# Patient Record
Sex: Male | Born: 1950 | Race: Black or African American | Hispanic: No | Smoking: Current every day smoker
Health system: Southern US, Community
[De-identification: ages and names within clinical notes are randomized; demographics above are authoritative.]

## PROBLEM LIST (undated history)

## (undated) DIAGNOSIS — R410 Disorientation, unspecified: Secondary | ICD-10-CM

## (undated) DIAGNOSIS — I5032 Chronic diastolic (congestive) heart failure: Secondary | ICD-10-CM

## (undated) DIAGNOSIS — I21A1 Myocardial infarction type 2: Secondary | ICD-10-CM

## (undated) DIAGNOSIS — J45909 Unspecified asthma, uncomplicated: Secondary | ICD-10-CM

## (undated) DIAGNOSIS — G5600 Carpal tunnel syndrome, unspecified upper limb: Secondary | ICD-10-CM

## (undated) DIAGNOSIS — Z720 Tobacco use: Secondary | ICD-10-CM

## (undated) DIAGNOSIS — F1010 Alcohol abuse, uncomplicated: Secondary | ICD-10-CM

## (undated) DIAGNOSIS — I10 Essential (primary) hypertension: Secondary | ICD-10-CM

## (undated) DIAGNOSIS — R946 Abnormal results of thyroid function studies: Secondary | ICD-10-CM

## (undated) DIAGNOSIS — I480 Paroxysmal atrial fibrillation: Secondary | ICD-10-CM

## (undated) DIAGNOSIS — N400 Enlarged prostate without lower urinary tract symptoms: Secondary | ICD-10-CM

## (undated) DIAGNOSIS — J309 Allergic rhinitis, unspecified: Secondary | ICD-10-CM

## (undated) HISTORY — DX: Carpal tunnel syndrome, unspecified upper limb: G56.00

## (undated) HISTORY — DX: Benign prostatic hyperplasia without lower urinary tract symptoms: N40.0

## (undated) HISTORY — DX: Allergic rhinitis, unspecified: J30.9

---

## 1999-11-14 ENCOUNTER — Emergency Department (HOSPITAL_COMMUNITY): Admission: EM | Admit: 1999-11-14 | Discharge: 1999-11-14 | Payer: Self-pay | Admitting: Emergency Medicine

## 1999-11-14 ENCOUNTER — Encounter: Payer: Self-pay | Admitting: Emergency Medicine

## 2008-06-04 ENCOUNTER — Emergency Department (HOSPITAL_COMMUNITY): Admission: EM | Admit: 2008-06-04 | Discharge: 2008-06-04 | Payer: Self-pay | Admitting: Emergency Medicine

## 2009-03-16 ENCOUNTER — Emergency Department (HOSPITAL_COMMUNITY): Admission: EM | Admit: 2009-03-16 | Discharge: 2009-03-16 | Payer: Self-pay | Admitting: Emergency Medicine

## 2010-04-09 ENCOUNTER — Emergency Department (HOSPITAL_COMMUNITY): Admission: EM | Admit: 2010-04-09 | Discharge: 2010-04-09 | Payer: Self-pay | Admitting: Emergency Medicine

## 2010-12-27 ENCOUNTER — Emergency Department (HOSPITAL_COMMUNITY)
Admission: EM | Admit: 2010-12-27 | Discharge: 2010-12-27 | Disposition: A | Discharge status: Home / Self Care | Payer: Self-pay | Attending: Emergency Medicine | Admitting: Emergency Medicine

## 2010-12-27 DIAGNOSIS — L299 Pruritus, unspecified: Secondary | ICD-10-CM | POA: Insufficient documentation

## 2010-12-27 DIAGNOSIS — J45909 Unspecified asthma, uncomplicated: Secondary | ICD-10-CM | POA: Insufficient documentation

## 2010-12-27 DIAGNOSIS — L2089 Other atopic dermatitis: Secondary | ICD-10-CM | POA: Insufficient documentation

## 2010-12-27 LAB — GLUCOSE, CAPILLARY: Glucose-Capillary: 82 mg/dL (ref 70–99)

## 2011-04-14 ENCOUNTER — Emergency Department (HOSPITAL_COMMUNITY)
Admission: EM | Admit: 2011-04-14 | Discharge: 2011-04-14 | Disposition: A | Discharge status: Home / Self Care | Payer: Self-pay | Attending: Emergency Medicine | Admitting: Emergency Medicine

## 2011-04-14 DIAGNOSIS — J45909 Unspecified asthma, uncomplicated: Secondary | ICD-10-CM | POA: Insufficient documentation

## 2011-04-14 DIAGNOSIS — R21 Rash and other nonspecific skin eruption: Secondary | ICD-10-CM | POA: Insufficient documentation

## 2012-08-03 ENCOUNTER — Emergency Department (HOSPITAL_COMMUNITY)
Admission: EM | Admit: 2012-08-03 | Discharge: 2012-08-03 | Disposition: A | Discharge status: Home / Self Care | Payer: Self-pay | Attending: Emergency Medicine | Admitting: Emergency Medicine

## 2012-08-03 ENCOUNTER — Encounter (HOSPITAL_COMMUNITY): Payer: Self-pay | Admitting: Emergency Medicine

## 2012-08-03 DIAGNOSIS — H5710 Ocular pain, unspecified eye: Secondary | ICD-10-CM

## 2012-08-03 DIAGNOSIS — M549 Dorsalgia, unspecified: Secondary | ICD-10-CM | POA: Insufficient documentation

## 2012-08-03 DIAGNOSIS — F17200 Nicotine dependence, unspecified, uncomplicated: Secondary | ICD-10-CM | POA: Insufficient documentation

## 2012-08-03 DIAGNOSIS — J45909 Unspecified asthma, uncomplicated: Secondary | ICD-10-CM | POA: Insufficient documentation

## 2012-08-03 DIAGNOSIS — S0560XA Penetrating wound without foreign body of unspecified eyeball, initial encounter: Secondary | ICD-10-CM | POA: Insufficient documentation

## 2012-08-03 DIAGNOSIS — Y9389 Activity, other specified: Secondary | ICD-10-CM | POA: Insufficient documentation

## 2012-08-03 DIAGNOSIS — IMO0002 Reserved for concepts with insufficient information to code with codable children: Secondary | ICD-10-CM | POA: Insufficient documentation

## 2012-08-03 DIAGNOSIS — Y9229 Other specified public building as the place of occurrence of the external cause: Secondary | ICD-10-CM | POA: Insufficient documentation

## 2012-08-03 DIAGNOSIS — Z23 Encounter for immunization: Secondary | ICD-10-CM | POA: Insufficient documentation

## 2012-08-03 HISTORY — DX: Unspecified asthma, uncomplicated: J45.909

## 2012-08-03 MED ORDER — FLUORESCEIN SODIUM 1 MG OP STRP
1.0000 | ORAL_STRIP | Freq: Once | OPHTHALMIC | Status: AC
  Administered 2012-08-03: 1 via OPHTHALMIC
  Filled 2012-08-03: qty 2

## 2012-08-03 MED ORDER — SULFAMETHOXAZOLE-TMP DS 800-160 MG PO TABS
1.0000 | ORAL_TABLET | Freq: Once | ORAL | Status: AC
  Administered 2012-08-03: 1 via ORAL
  Filled 2012-08-03: qty 1

## 2012-08-03 MED ORDER — SULFAMETHOXAZOLE-TRIMETHOPRIM 800-160 MG PO TABS: 1.0000 | ORAL_TABLET | Freq: Two times a day (BID) | ORAL | Status: DC

## 2012-08-03 MED ORDER — TETANUS-DIPHTH-ACELL PERTUSSIS 5-2.5-18.5 LF-MCG/0.5 IM SUSP
0.5000 mL | Freq: Once | INTRAMUSCULAR | Status: AC
  Administered 2012-08-03: 0.5 mL via INTRAMUSCULAR
  Filled 2012-08-03: qty 0.5

## 2012-08-03 MED ORDER — TETRACAINE HCL 0.5 % OP SOLN
2.0000 [drp] | Freq: Once | OPHTHALMIC | Status: AC
  Administered 2012-08-03: 2 [drp] via OPHTHALMIC
  Filled 2012-08-03: qty 2
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
## 2012-08-03 NOTE — ED Provider Notes (Signed)
Medical screening examination/treatment/procedure(s) were performed by non-physician practitioner and as supervising physician I was immediately available for consultation/collaboration.  Susy Placzek T Derisha Funderburke, MD 08/03/12 1713 

## 2015-11-29 ENCOUNTER — Encounter (HOSPITAL_COMMUNITY): Payer: Self-pay | Admitting: Emergency Medicine

## 2015-11-29 ENCOUNTER — Emergency Department (HOSPITAL_COMMUNITY): Payer: Medicaid Other

## 2015-11-29 ENCOUNTER — Emergency Department (HOSPITAL_COMMUNITY)
Admission: EM | Admit: 2015-11-29 | Discharge: 2015-11-29 | Disposition: A | Discharge status: Home / Self Care | Payer: Medicaid Other | Attending: Emergency Medicine | Admitting: Emergency Medicine

## 2015-11-29 DIAGNOSIS — Z79899 Other long term (current) drug therapy: Secondary | ICD-10-CM | POA: Diagnosis not present

## 2015-11-29 DIAGNOSIS — I10 Essential (primary) hypertension: Secondary | ICD-10-CM | POA: Diagnosis not present

## 2015-11-29 DIAGNOSIS — J4531 Mild persistent asthma with (acute) exacerbation: Secondary | ICD-10-CM | POA: Diagnosis not present

## 2015-11-29 DIAGNOSIS — F17210 Nicotine dependence, cigarettes, uncomplicated: Secondary | ICD-10-CM | POA: Insufficient documentation

## 2015-11-29 DIAGNOSIS — R062 Wheezing: Secondary | ICD-10-CM | POA: Diagnosis present

## 2015-11-29 HISTORY — DX: Essential (primary) hypertension: I10

## 2015-11-29 LAB — BASIC METABOLIC PANEL
Anion gap: 9 (ref 5–15)
CALCIUM: 9.5 mg/dL (ref 8.9–10.3)
CO2: 23 mmol/L (ref 22–32)
CREATININE: 1.12 mg/dL (ref 0.61–1.24)
Chloride: 106 mmol/L (ref 101–111)
Glucose, Bld: 91 mg/dL (ref 65–99)
Potassium: 3.9 mmol/L (ref 3.5–5.1)
SODIUM: 138 mmol/L (ref 135–145)

## 2015-11-29 MED ORDER — ALBUTEROL SULFATE (2.5 MG/3ML) 0.083% IN NEBU: 2.5000 mg | INHALATION_SOLUTION | Freq: Four times a day (QID) | RESPIRATORY_TRACT | Status: DC | PRN

## 2015-11-29 MED ORDER — IPRATROPIUM-ALBUTEROL 0.5-2.5 (3) MG/3ML IN SOLN
3.0000 mL | Freq: Once | RESPIRATORY_TRACT | Status: AC
  Administered 2015-11-29: 3 mL via RESPIRATORY_TRACT
  Filled 2015-11-29: qty 3

## 2015-11-29 MED ORDER — ALBUTEROL SULFATE HFA 108 (90 BASE) MCG/ACT IN AERS: 1.0000 | INHALATION_SPRAY | Freq: Four times a day (QID) | RESPIRATORY_TRACT | Status: DC | PRN

## 2015-11-29 MED ORDER — HYDROCHLOROTHIAZIDE 12.5 MG PO TABS: 12.5000 mg | ORAL_TABLET | Freq: Every day | ORAL | Status: DC

## 2015-11-29 MED ORDER — PREDNISONE 20 MG PO TABS
60.0000 mg | ORAL_TABLET | Freq: Once | ORAL | Status: AC
  Administered 2015-11-29: 60 mg via ORAL
  Filled 2015-11-29: qty 3

## 2015-11-29 MED ORDER — CETIRIZINE HCL 10 MG PO TABS: 10.0000 mg | ORAL_TABLET | Freq: Every day | ORAL | Status: DC

## 2015-11-29 MED ORDER — AMLODIPINE BESYLATE 10 MG PO TABS: 10.0000 mg | ORAL_TABLET | Freq: Every day | ORAL | Status: DC

## 2015-11-29 MED ORDER — PREDNISONE 20 MG PO TABS: 40.0000 mg | ORAL_TABLET | Freq: Every day | ORAL | Status: DC

## 2015-11-29 NOTE — ED Provider Notes (Signed)
CSN: [id number]     Arrival date & time 11/29/15  1050 History  By signing my name below, I, Ronney Lion, attest that this documentation has been prepared under the direction and in the presence of Danelle Berry, PA-C. Electronically Signed: Ronney Lion, ED Scribe. 11/29/2015. 1:23 PM.    Chief Complaint  Patient presents with  . Asthma   The history is provided by the patient. No language interpreter was used.    HPI Comments: Mark [name] is a 65 y.o. male with a history of asthma and hypertension, who presents to the Emergency Department complaining of wheeze and mild SOB that began yesterday, unimproved with using his grandsons albuterol inhaler.  He states his seasonal allergies have been bothering him and he has not been taking his allergy meds.  He has productive cough with green to brown sputum.  He denies CP, fever, chills, sweats, weightloss.  He states he continues to smoke 3 cigarettes a day.  He also requests refill of his HTN meds, but he does not know what they are.  He has no other complaints.  Past Medical History  Diagnosis Date  . Asthma   . Hypertension    History reviewed. No pertinent past surgical history. No family history on file. Social History  Substance Use Topics  . Smoking status: Current Every Day Smoker -- 0.50 packs/day    Types: Cigarettes  . Smokeless tobacco: None  . Alcohol Use: Yes     Comment: 2 beers daily    Review of Systems  All other systems reviewed and are negative.     Allergies  Review of patient's allergies indicates no known allergies.  Home Medications   Prior to Admission medications   Medication Sig Start Date End Date Taking? Authorizing Provider  albuterol (PROVENTIL HFA;VENTOLIN HFA) 108 (90 Base) MCG/ACT inhaler Inhale 1-2 puffs into the lungs every 6 (six) hours as needed for wheezing or shortness of breath. 11/29/15   Danelle Berry, PA-C  albuterol (PROVENTIL) (2.5 MG/3ML) 0.083% nebulizer solution Take 3 mLs (2.5 mg  total) by nebulization every 6 (six) hours as needed for wheezing or shortness of breath. 11/29/15   Danelle Berry, PA-C  amLODipine (NORVASC) 10 MG tablet Take 1 tablet (10 mg total) by mouth daily. 11/29/15   Danelle Berry, PA-C  cetirizine (ZYRTEC ALLERGY) 10 MG tablet Take 1 tablet (10 mg total) by mouth daily. 11/29/15   Danelle Berry, PA-C  hydrochlorothiazide (HYDRODIURIL) 12.5 MG tablet Take 1 tablet (12.5 mg total) by mouth daily. 11/29/15   Danelle Berry, PA-C  predniSONE (DELTASONE) 20 MG tablet Take 2 tablets (40 mg total) by mouth daily. 11/29/15   Danelle Berry, PA-C  sulfamethoxazole-trimethoprim (SEPTRA DS) 800-160 MG per tablet Take 1 tablet by mouth every 12 (twelve) hours. 08/03/12   Jethro Bastos, NP   BP 176/100 mmHg  Pulse 97  Temp(Src) 98.3 F (36.8 C) (Oral)  Resp 18  Ht 6' 2.5" (1.892 m)  Wt 79.379 kg  BMI 22.17 kg/m2  SpO2 98% Physical Exam  Constitutional: He is oriented to person, place, and time. He appears well-developed and well-nourished. No distress.  HENT:  Head: Normocephalic and atraumatic.  Nose: Nose normal.  Mouth/Throat: Oropharynx is clear and moist. No oropharyngeal exudate.  Eyes: Conjunctivae and EOM are normal. Pupils are equal, round, and reactive to light. Right eye exhibits no discharge. Left eye exhibits no discharge. No scleral icterus.  Neck: Normal range of motion. No JVD present. No tracheal deviation present. No  thyromegaly present.  Cardiovascular: Normal rate, regular rhythm, normal heart sounds and intact distal pulses.  Exam reveals no gallop and no friction rub.   No murmur heard. Pulmonary/Chest: Effort normal. No respiratory distress. He has wheezes. He has no rales. He exhibits no tenderness.  BS decreased bilaterally at the bases, diffuse expiratory wheeze, no tachypnea, no retractions  Abdominal: Soft. Bowel sounds are normal. He exhibits no distension and no mass. There is no tenderness. There is no rebound and no guarding.   Musculoskeletal: Normal range of motion. He exhibits no edema or tenderness.  Lymphadenopathy:    He has no cervical adenopathy.  Neurological: He is alert and oriented to person, place, and time. He has normal reflexes. No cranial nerve deficit. He exhibits normal muscle tone. Coordination normal.  Skin: Skin is warm and dry. No rash noted. He is not diaphoretic. No erythema. No pallor.  Psychiatric: He has a normal mood and affect. His behavior is normal. Judgment and thought content normal.  Nursing note and vitals reviewed.   ED Course  Procedures (including critical care time)  DIAGNOSTIC STUDIES: Oxygen Saturation is 100% on RA, normal by my interpretation.    COORDINATION OF CARE: 1:23 PM - Discussed treatment plan with pt at bedside which includes steroids, breathing treatments, basic blood work, CXR. Pt verbalized understanding and agreed to plan.   Labs Review Labs Reviewed  BASIC METABOLIC PANEL - Abnormal; Notable for the following:    BUN <5 (*)    All other components within normal limits    Imaging Review Dg Chest 2 View  11/29/2015  CLINICAL DATA:  Productive cough with wheezing and diaphoresis for 3 days, history asthma, hypertension, smoking EXAM: CHEST  2 VIEW COMPARISON:  04/09/2010 FINDINGS: Normal heart size, mediastinal contours, and pulmonary vascularity. Peribronchial thickening and minimal hyperinflation consistent with history of asthma. Subsegmental atelectasis at both bases greater on RIGHT. No acute infiltrate, pleural effusion or pneumothorax. Probable old healed fractures of the RIGHT fifth and sixth ribs anterolaterally. IMPRESSION: Peribronchial thickening and minimal hyperinflation consistent with history of asthma. Bibasilar atelectasis greater on RIGHT. Electronically Signed   By: Ulyses Southward M.D.   On: 11/29/2015 13:53   I have personally reviewed and evaluated these images and lab results as part of my medical decision-making.   EKG  Interpretation None      MDM   Pt with asthma exacerbation, not currently managed, worse with seasonal allergies.  Pt also requests BP meds be prescribed, although he doesn't know what he was prescribed in the past.  Cannot find record after chart review of what medications he was prescribed.  Pt has productive cough, w/o fever, sweats.  CXR obtained.  Pt given steroids and duonebs in the ER.  Pt was afebrile, HR normal, BP elevated, w/o CP or HA complaint.  CXR negative.  Patient ambulated in ED with O2 saturations maintained >90, no current signs of respiratory distress. Lung exam improved after nebulizer treatment. Prednisone given in the ED and pt will bd dc with 5 day burst. Pt states they are breathing at baseline. Pt has been instructed to continue using prescribed medications and to speak with PCP about today's exacerbation.   Pt will be prescribed norvasc and low dose HCTZ for BP.  BMP obtained today.  Case Management asked to assist with setting up follow up on BP.  Return precautions reviewed.  Pt does not appear to have any signs or symptoms of hypertensive emergency, no concern for end-organ damage.  He reports significant improvement of his breathing while in the ER.  D/C in good condition.     Final diagnoses:  Essential hypertension  Asthmatic bronchitis, mild persistent, with acute exacerbation   I personally performed the services described in this documentation, which was scribed in my presence. The recorded information has been reviewed and is accurate.      Danelle BerryLeisa Gregorey Nabor, PA-C 11/30/15 1327  Arby BarretteMarcy Pfeiffer, MD 12/01/15 [phone]

## 2015-11-29 NOTE — ED Notes (Signed)
Pt is in stable condition upon d/c and ambulates from ED. 

## 2015-11-29 NOTE — Discharge Instructions (Signed)
Asthma, Acute Bronchospasm °Acute bronchospasm caused by asthma is also referred to as an asthma attack. Bronchospasm means your air passages become narrowed. The narrowing is caused by inflammation and tightening of the muscles in the air tubes (bronchi) in your lungs. This can make it hard to breathe or cause you to wheeze and cough. °CAUSES °Possible triggers are: °· Animal dander from the skin, hair, or feathers of animals. °· Dust mites contained in house dust. °· Cockroaches. °· Pollen from trees or grass. °· Mold. °· Cigarette or tobacco smoke. °· Air pollutants such as dust, household cleaners, hair sprays, aerosol sprays, paint fumes, strong chemicals, or strong odors. °· Cold air or weather changes. Cold air may trigger inflammation. Winds increase molds and pollens in the air. °· Strong emotions such as crying or laughing hard. °· Stress. °· Certain medicines such as aspirin or beta-blockers. °· Sulfites in foods and drinks, such as dried fruits and wine. °· Infections or inflammatory conditions, such as a flu, cold, or inflammation of the nasal membranes (rhinitis). °· Gastroesophageal reflux disease (GERD). GERD is a condition where stomach acid backs up into your esophagus. °· Exercise or strenuous activity. °SIGNS AND SYMPTOMS  °· Wheezing. °· Excessive coughing, particularly at night. °· Chest tightness. °· Shortness of breath. °DIAGNOSIS  °Your health care provider will ask you about your medical history and perform a physical exam. A chest X-ray or blood testing may be performed to look for other causes of your symptoms or other conditions that may have triggered your asthma attack.  °TREATMENT  °Treatment is aimed at reducing inflammation and opening up the airways in your lungs.  Most asthma attacks are treated with inhaled medicines. These include quick relief or rescue medicines (such as bronchodilators) and controller medicines (such as inhaled corticosteroids). These medicines are sometimes  given through an inhaler or a nebulizer. Systemic steroid medicine taken by mouth or given through an IV tube also can be used to reduce the inflammation when an attack is moderate or severe. Antibiotic medicines are only used if a bacterial infection is present.  °HOME CARE INSTRUCTIONS  °· Rest. °· Drink plenty of liquids. This helps the mucus to remain thin and be easily coughed up. Only use caffeine in moderation and do not use alcohol until you have recovered from your illness. °· Do not smoke. Avoid being exposed to secondhand smoke. °· You play a critical role in keeping yourself in good health. Avoid exposure to things that cause you to wheeze or to have breathing problems. °· Keep your medicines up-to-date and available. Carefully follow your health care provider's treatment plan. °· Take your medicine exactly as prescribed. °· When pollen or pollution is bad, keep windows closed and use an air conditioner or go to places with air conditioning. °· Asthma requires careful medical care. See your health care provider for a follow-up as advised. If you are more than [redacted] weeks pregnant and you were prescribed any new medicines, let your obstetrician know about the visit and how you are doing. Follow up with your health care provider as directed. °· After you have recovered from your asthma attack, make an appointment with your outpatient doctor to talk about ways to reduce the likelihood of future attacks. If you do not have a doctor who manages your asthma, make an appointment with a primary care doctor to discuss your asthma. °SEEK IMMEDIATE MEDICAL CARE IF:  °· You are getting worse. °· You have trouble breathing. If severe, call your local   emergency services (911 in the [place]  You develop chest pain or discomfort.  You are vomiting.  You are not able to keep fluids down.  You are coughing up yellow, green, brown, or bloody sputum.  You have a fever and your symptoms suddenly get worse.  You have  trouble swallowing. MAKE SURE YOU:   Understand these instructions.  Will watch your condition.  Will get help right away if you are not doing well or get worse.   This information is not intended to replace advice given to you by your health care provider. Make sure you discuss any questions you have with your health care provider.   Document Released: 10/10/2006 Document Revised: 06/30/2013 Document Reviewed: 12/31/2012 Elsevier Interactive Patient Education 2016 [employer] Inc[employer]  Cough, Adult Coughing is a reflex that clears your throat and your airways. Coughing helps to heal and protect your lungs. It is normal to cough occasionally, but a cough that happens with other symptoms or lasts a long time may be a sign of a condition that needs treatment. A cough may last only 2-3 weeks (acute), or it may last longer than 8 weeks (chronic). CAUSES Coughing is commonly caused by:  Breathing in substances that irritate your lungs.  A viral or bacterial respiratory infection.  Allergies.  Asthma.  Postnasal drip.  Smoking.  Acid backing up from the stomach into the esophagus (gastroesophageal reflux).  Certain medicines.  Chronic lung problems, including COPD (or rarely, lung cancer).  Other medical conditions such as heart failure. HOME CARE INSTRUCTIONS  Pay attention to any changes in your symptoms. Take these actions to help with your discomfort:  Take medicines only as told by your health care provider.  If you were prescribed an antibiotic medicine, take it as told by your health care provider. Do not stop taking the antibiotic even if you start to feel better.  Talk with your health care provider before you take a cough suppressant medicine.  Drink enough fluid to keep your urine clear or pale yellow.  If the air is dry, use a cold steam vaporizer or humidifier in your bedroom or your home to help loosen secretions.  Avoid anything that causes you to cough at work  or at home.  If your cough is worse at night, try sleeping in a semi-upright position.  Avoid cigarette smoke. If you smoke, quit smoking. If you need help quitting, ask your health care provider.  Avoid caffeine.  Avoid alcohol.  Rest as needed. SEEK MEDICAL CARE IF:   You have new symptoms.  You cough up pus.  Your cough does not get better after 2-3 weeks, or your cough gets worse.  You cannot control your cough with suppressant medicines and you are losing sleep.  You develop pain that is getting worse or pain that is not controlled with pain medicines.  You have a fever.  You have unexplained weight loss.  You have night sweats. SEEK IMMEDIATE MEDICAL CARE IF:  You cough up blood.  You have difficulty breathing.  Your heartbeat is very fast.   This information is not intended to replace advice given to you by your health care provider. Make sure you discuss any questions you have with your health care provider.   Document Released: 12/22/2010 Document Revised: 03/16/2015 Document Reviewed: 09/01/2014 Elsevier Interactive Patient Education 2016 Elsevier Inc.  DASH Eating Plan DASH stands for "Dietary Approaches to Stop Hypertension." The DASH eating plan is a healthy eating plan that has been  shown to reduce high blood pressure (hypertension). Additional health benefits may include reducing the risk of type 2 diabetes mellitus, heart disease, and stroke. The DASH eating plan may also help with weight loss. WHAT DO I NEED TO KNOW ABOUT THE DASH EATING PLAN? For the DASH eating plan, you will follow these general guidelines:  Choose foods with a percent daily value for sodium of less than 5% (as listed on the food label).  Use salt-free seasonings or herbs instead of table salt or sea salt.  Check with your health care provider or pharmacist before using salt substitutes.  Eat lower-sodium products, often labeled as "lower sodium" or "no salt added."  Eat  fresh foods.  Eat more vegetables, fruits, and low-fat dairy products.  Choose whole grains. Look for the word "whole" as the first word in the ingredient list.  Choose fish and skinless chicken or [place] more often than red meat. Limit fish, poultry, and meat to 6 oz (170 g) each day.  Limit sweets, desserts, sugars, and sugary drinks.  Choose heart-healthy fats.  Limit cheese to 1 oz (28 g) per day.  Eat more home-cooked food and less restaurant, buffet, and fast food.  Limit fried foods.  Cook foods using methods other than frying.  Limit canned vegetables. If you do use them, rinse them well to decrease the sodium.  When eating at a restaurant, ask that your food be prepared with less salt, or no salt if possible. WHAT FOODS CAN I EAT? Seek help from a dietitian for individual calorie needs. Grains Whole grain or whole wheat bread. Brown rice. Whole grain or whole wheat pasta. Quinoa, bulgur, and whole grain cereals. Low-sodium cereals. Corn or whole wheat flour tortillas. Whole grain cornbread. Whole grain crackers. Low-sodium crackers. Vegetables Fresh or frozen vegetables (raw, steamed, roasted, or grilled). Low-sodium or reduced-sodium tomato and vegetable juices. Low-sodium or reduced-sodium tomato sauce and paste. Low-sodium or reduced-sodium canned vegetables.  Fruits All fresh, canned (in natural juice), or frozen fruits. Meat and Other Protein Products Ground beef (85% or leaner), grass-fed beef, or beef trimmed of fat. Skinless chicken or [place]. Ground chicken or [place]. Pork trimmed of fat. All fish and seafood. Eggs. Dried beans, peas, or lentils. Unsalted nuts and seeds. Unsalted canned beans. Dairy Low-fat dairy products, such as skim or 1% milk, 2% or reduced-fat cheeses, low-fat ricotta or cottage cheese, or plain low-fat yogurt. Low-sodium or reduced-sodium cheeses. Fats and Oils Tub margarines without trans fats. Light or reduced-fat mayonnaise and salad  dressings (reduced sodium). Avocado. Safflower, olive, or canola oils. Natural peanut or almond butter. Other Unsalted popcorn and pretzels. The items listed above may not be a complete list of recommended foods or beverages. Contact your dietitian for more options. WHAT FOODS ARE NOT RECOMMENDED? Grains White bread. White pasta. White rice. Refined cornbread. Bagels and croissants. Crackers that contain trans fat. Vegetables Creamed or fried vegetables. Vegetables in a cheese sauce. Regular canned vegetables. Regular canned tomato sauce and paste. Regular tomato and vegetable juices. Fruits Dried fruits. Canned fruit in light or heavy syrup. Fruit juice. Meat and Other Protein Products Fatty cuts of meat. Ribs, chicken wings, bacon, sausage, bologna, salami, chitterlings, fatback, hot dogs, bratwurst, and packaged luncheon meats. Salted nuts and seeds. Canned beans with salt. Dairy Whole or 2% milk, cream, half-and-half, and cream cheese. Whole-fat or sweetened yogurt. Full-fat cheeses or blue cheese. Nondairy creamers and whipped toppings. Processed cheese, cheese spreads, or cheese curds. Condiments Onion and garlic salt, seasoned  salt, table salt, and sea salt. Canned and packaged gravies. Worcestershire sauce. Tartar sauce. Barbecue sauce. Teriyaki sauce. Soy sauce, including reduced sodium. Steak sauce. Fish sauce. Oyster sauce. Cocktail sauce. Horseradish. Ketchup and mustard. Meat flavorings and tenderizers. Bouillon cubes. Hot sauce. Tabasco sauce. Marinades. Taco seasonings. Relishes. Fats and Oils Butter, stick margarine, lard, shortening, ghee, and bacon fat. Coconut, palm kernel, or palm oils. Regular salad dressings. Other Pickles and olives. Salted popcorn and pretzels. The items listed above may not be a complete list of foods and beverages to avoid. Contact your dietitian for more information. WHERE CAN I FIND MORE INFORMATION? National Heart, Lung, and Blood Institute:  [employer]   This information is not intended to replace advice given to you by your health care provider. Make sure you discuss any questions you have with your health care provider.   Document Released: 06/14/2011 Document Revised: 07/16/2014 Document Reviewed: 04/29/2013 Elsevier Interactive Patient Education 2016 [employer].  How to Use a Nebulizer If you have asthma or other breathing problems, you might need to breathe in (inhale) medicine. This can be done with a nebulizer. A nebulizer is a device that turns liquid medicine into a mist that you can inhale.  There are different kinds of nebulizers. Most are small. With some, you breathe in through a mouthpiece. With others, a mask fits over your nose and mouth. Most nebulizers must be connected to a small air compressor. Air is forced through tubing from the compressor to the nebulizer. The forced air changes the liquid into a fine spray. RISKS AND COMPLICATIONS The nebulizer must work properly for it to help your breathing. If the nebulizer does not produce mist, or if foam comes out, this indicates that the nebulizer is not working properly. Sometimes a filter can get clogged, or there might be a problem with the air compressor. Check the instruction booklet that came with your nebulizer. It should tell you how to fix problems or where to call for help. You should have at least one extra nebulizer at home. That way, you will always have one when you need it.  HOW TO PREPARE BEFORE USING THE NEBULIZER Take these steps before using the nebulizer:  Check your medicine. Make sure it has not expired and is not damaged in any way.   Wash your hands with soap and water.   Put all the parts of your nebulizer on a sturdy, flat surface. Make sure the tubing connects the compressor and the nebulizer.  Measure the liquid medicine according to your health care provider's instructions. Pour it into the  nebulizer.  Attach the mouthpiece or mask.   Test the nebulizer by turning it on to make sure a spray is coming out. Then, turn it off.  HOW TO USE THE NEBULIZER  Sit down and focus on staying relaxed.   If your nebulizer has a mask, put it over your nose and mouth. If you use a mouthpiece, put it in your mouth. Press your lips firmly around the mouthpiece.  Turn on the nebulizer.   Breathe out.   Some nebulizers have a finger valve. If yours does, cover up the air hole so the air gets to the nebulizer.  Once the medicine begins to mist out, take slow, deep breaths. If there is a finger valve, release it at the end of your breath.  Continue taking slow, deep breaths until the nebulizer is empty.  Be sure to stop the machine at any point if you start  coughing or if the medicine foams or bubbles. HOW TO CLEAN THE NEBULIZER  The nebulizer and all its parts must be kept very clean. Follow the manufacturer's instructions for cleaning. For most nebulizers, you should follow these guidelines:  Wash the nebulizer after each use. Use warm water and soap. Rinse it well. Shake the nebulizer to remove extra water. Put it on a clean towel until it is completely dry. To make sure it is dry, put the nebulizer back together. Turn on the compressor for a few minutes. This will blow air through the nebulizer.   Do not wash the tubing or the finger valve.   Store the nebulizer in a dust-free place.   Inspect the filter every week. Replace it any time it looks dirty.   Sometimes the nebulizer will need a more complete cleaning. The instruction booklet should say how often you need to do this. SEEK MEDICAL CARE IF:   You continue to have difficulty breathing.   You have trouble using the nebulizer.    This information is not intended to replace advice given to you by your health care provider. Make sure you discuss any questions you have with your health care provider.   Document  Released: 06/13/2009 Document Revised: 07/16/2014 Document Reviewed: 12/15/2012 Elsevier Interactive Patient Education 2016 [employer].  How to Use an Inhaler Proper inhaler technique is very important. Good technique ensures that the medicine reaches the lungs. Poor technique results in depositing the medicine on the tongue and back of the throat rather than in the airways. If you do not use the inhaler with good technique, the medicine will not help you. STEPS TO FOLLOW IF USING AN INHALER WITHOUT AN EXTENSION TUBE  Remove the cap from the inhaler.  If you are using the inhaler for the first time, you will need to prime it. Shake the inhaler for 5 seconds and release four puffs into the air, away from your face. Ask your health care provider or pharmacist if you have questions about priming your inhaler.  Shake the inhaler for 5 seconds before each breath in (inhalation).  Position the inhaler so that the top of the canister faces up.  Put your index finger on the top of the medicine canister. Your thumb supports the bottom of the inhaler.  Open your mouth.  Either place the inhaler between your teeth and place your lips tightly around the mouthpiece, or hold the inhaler 1-2 inches away from your open mouth. If you are unsure of which technique to use, ask your health care provider.  Breathe out (exhale) normally and as completely as possible.  Press the canister down with your index finger to release the medicine.  At the same time as the canister is pressed, inhale deeply and slowly until your lungs are completely filled. This should take 4-6 seconds. Keep your tongue down.  Hold the medicine in your lungs for 5-10 seconds (10 seconds is best). This helps the medicine get into the small airways of your lungs.  Breathe out slowly, through pursed lips. Whistling is an example of pursed lips.  Wait at least 15-30 seconds between puffs. Continue with the above steps until you have  taken the number of puffs your health care provider has ordered. Do not use the inhaler more than your health care provider tells you.  Replace the cap on the inhaler.  Follow the directions from your health care provider or the inhaler insert for cleaning the inhaler. STEPS TO FOLLOW  IF USING AN INHALER WITH AN EXTENSION (SPACER)  Remove the cap from the inhaler.  If you are using the inhaler for the first time, you will need to prime it. Shake the inhaler for 5 seconds and release four puffs into the air, away from your face. Ask your health care provider or pharmacist if you have questions about priming your inhaler.  Shake the inhaler for 5 seconds before each breath in (inhalation).  Place the open end of the spacer onto the mouthpiece of the inhaler.  Position the inhaler so that the top of the canister faces up and the spacer mouthpiece faces you.  Put your index finger on the top of the medicine canister. Your thumb supports the bottom of the inhaler and the spacer.  Breathe out (exhale) normally and as completely as possible.  Immediately after exhaling, place the spacer between your teeth and into your mouth. Close your lips tightly around the spacer.  Press the canister down with your index finger to release the medicine.  At the same time as the canister is pressed, inhale deeply and slowly until your lungs are completely filled. This should take 4-6 seconds. Keep your tongue down and out of the way.  Hold the medicine in your lungs for 5-10 seconds (10 seconds is best). This helps the medicine get into the small airways of your lungs. Exhale.  Repeat inhaling deeply through the spacer mouthpiece. Again hold that breath for up to 10 seconds (10 seconds is best). Exhale slowly. If it is difficult to take this second deep breath through the spacer, breathe normally several times through the spacer. Remove the spacer from your mouth.  Wait at least 15-30 seconds between puffs.  Continue with the above steps until you have taken the number of puffs your health care provider has ordered. Do not use the inhaler more than your health care provider tells you.  Remove the spacer from the inhaler, and place the cap on the inhaler.  Follow the directions from your health care provider or the inhaler insert for cleaning the inhaler and spacer. If you are using different kinds of inhalers, use your quick relief medicine to open the airways 10-15 minutes before using a steroid if instructed to do so by your health care provider. If you are unsure which inhalers to use and the order of using them, ask your health care provider, nurse, or respiratory therapist. If you are using a steroid inhaler, always rinse your mouth with water after your last puff, then gargle and spit out the water. Do not swallow the water. AVOID:  Inhaling before or after starting the spray of medicine. It takes practice to coordinate your breathing with triggering the spray.  Inhaling through the nose (rather than the mouth) when triggering the spray. HOW TO DETERMINE IF YOUR INHALER IS FULL OR NEARLY EMPTY You cannot know when an inhaler is empty by shaking it. A few inhalers are now being made with dose counters. Ask your health care provider for a prescription that has a dose counter if you feel you need that extra help. If your inhaler does not have a counter, ask your health care provider to help you determine the date you need to refill your inhaler. Write the refill date on a calendar or your inhaler canister. Refill your inhaler 7-10 days before it runs out. Be sure to keep an adequate supply of medicine. This includes making sure it is not expired, and that you have a  spare inhaler.  SEEK MEDICAL CARE IF:   Your symptoms are only partially relieved with your inhaler.  You are having trouble using your inhaler.  You have some increase in phlegm. SEEK IMMEDIATE MEDICAL CARE IF:   You feel little or  no relief with your inhalers. You are still wheezing and are feeling shortness of breath or tightness in your chest or both.  You have dizziness, headaches, or a fast heart rate.  You have chills, fever, or night sweats.  You have a noticeable increase in phlegm production, or there is blood in the phlegm. MAKE SURE YOU:   Understand these instructions.  Will watch your condition.  Will get help right away if you are not doing well or get worse.   This information is not intended to replace advice given to you by your health care provider. Make sure you discuss any questions you have with your health care provider.   Document Released: 06/22/2000 Document Revised: 04/15/2013 Document Reviewed: 01/22/2013 Elsevier Interactive Patient Education 2016 [employer].  Hypertension Hypertension, commonly called high blood pressure, is when the force of blood pumping through your arteries is too strong. Your arteries are the blood vessels that carry blood from your heart throughout your body. A blood pressure reading consists of a higher number over a lower number, such as 110/72. The higher number (systolic) is the pressure inside your arteries when your heart pumps. The lower number (diastolic) is the pressure inside your arteries when your heart relaxes. Ideally you want your blood pressure below 120/80. Hypertension forces your heart to work harder to pump blood. Your arteries may become narrow or stiff. Having untreated or uncontrolled hypertension can cause heart attack, stroke, kidney disease, and other problems. RISK FACTORS Some risk factors for high blood pressure are controllable. Others are not.  Risk factors you cannot control include:   Race. You may be at higher risk if you are African American.  Age. Risk increases with age.  Gender. Men are at higher risk than women before age 9 years. After age 94, women are at higher risk than men. Risk factors you can control  include:  Not getting enough exercise or physical activity.  Being overweight.  Getting too much fat, sugar, calories, or salt in your diet.  Drinking too much alcohol. SIGNS AND SYMPTOMS Hypertension does not usually cause signs or symptoms. Extremely high blood pressure (hypertensive crisis) may cause headache, anxiety, shortness of breath, and nosebleed. DIAGNOSIS To check if you have hypertension, your health care provider will measure your blood pressure while you are seated, with your arm held at the level of your heart. It should be measured at least twice using the same arm. Certain conditions can cause a difference in blood pressure between your right and left arms. A blood pressure reading that is higher than normal on one occasion does not mean that you need treatment. If it is not clear whether you have high blood pressure, you may be asked to return on a different day to have your blood pressure checked again. Or, you may be asked to monitor your blood pressure at home for 1 or more weeks. TREATMENT Treating high blood pressure includes making lifestyle changes and possibly taking medicine. Living a healthy lifestyle can help lower high blood pressure. You may need to change some of your habits. Lifestyle changes may include:  Following the DASH diet. This diet is high in fruits, vegetables, and whole grains. It is low in salt, red  meat, and added sugars.  Keep your sodium intake below 2,300 mg per day.  Getting at least 30-45 minutes of aerobic exercise at least 4 times per week.  Losing weight if necessary.  Not smoking.  Limiting alcoholic beverages.  Learning ways to reduce stress. Your health care provider may prescribe medicine if lifestyle changes are not enough to get your blood pressure under control, and if one of the following is true:  You are 60-61 years of age and your systolic blood pressure is above 140.  You are 48 years of age or older, and your  systolic blood pressure is above 150.  Your diastolic blood pressure is above 90.  You have diabetes, and your systolic blood pressure is over 140 or your diastolic blood pressure is over 90.  You have kidney disease and your blood pressure is above 140/90.  You have heart disease and your blood pressure is above 140/90. Your personal target blood pressure may vary depending on your medical conditions, your age, and other factors. HOME CARE INSTRUCTIONS  Have your blood pressure rechecked as directed by your health care provider.   Take medicines only as directed by your health care provider. Follow the directions carefully. Blood pressure medicines must be taken as prescribed. The medicine does not work as well when you skip doses. Skipping doses also puts you at risk for problems.  Do not smoke.   Monitor your blood pressure at home as directed by your health care provider. SEEK MEDICAL CARE IF:   You think you are having a reaction to medicines taken.  You have recurrent headaches or feel dizzy.  You have swelling in your ankles.  You have trouble with your vision. SEEK IMMEDIATE MEDICAL CARE IF:  You develop a severe headache or confusion.  You have unusual weakness, numbness, or feel faint.  You have severe chest or abdominal pain.  You vomit repeatedly.  You have trouble breathing. MAKE SURE YOU:   Understand these instructions.  Will watch your condition.  Will get help right away if you are not doing well or get worse.   This information is not intended to replace advice given to you by your health care provider. Make sure you discuss any questions you have with your health care provider.   Document Released: 06/25/2005 Document Revised: 11/09/2014 Document Reviewed: 04/17/2013 Elsevier Interactive Patient Education [employer]

## 2015-11-29 NOTE — ED Notes (Signed)
Patient states he has been having problems with his asthma.   Patient is talking in full sentences and denies shortness of breath at triage.   Patient states he needs to get a nebulizer machine, that his inhaler is not working most of the time.   Patient states he has been using his son's nebulizer at home, but "i am almost out of medicine".

## 2015-11-29 NOTE — ED Notes (Signed)
Patient transported to X-ray 

## 2015-11-29 NOTE — ED Notes (Signed)
Returned from xray

## 2015-11-29 NOTE — ED Notes (Signed)
Pulse Oximetry while ambulating maintained at 98%

## 2016-07-01 ENCOUNTER — Encounter (HOSPITAL_COMMUNITY): Payer: Self-pay | Admitting: *Deleted

## 2016-07-01 ENCOUNTER — Emergency Department (HOSPITAL_COMMUNITY): Payer: Medicaid Other

## 2016-07-01 ENCOUNTER — Emergency Department (HOSPITAL_COMMUNITY)
Admission: EM | Admit: 2016-07-01 | Discharge: 2016-07-01 | Disposition: A | Discharge status: Home / Self Care | Payer: Medicaid Other | Attending: Emergency Medicine | Admitting: Emergency Medicine

## 2016-07-01 DIAGNOSIS — Z79899 Other long term (current) drug therapy: Secondary | ICD-10-CM | POA: Diagnosis not present

## 2016-07-01 DIAGNOSIS — I10 Essential (primary) hypertension: Secondary | ICD-10-CM | POA: Diagnosis not present

## 2016-07-01 DIAGNOSIS — F17210 Nicotine dependence, cigarettes, uncomplicated: Secondary | ICD-10-CM | POA: Diagnosis not present

## 2016-07-01 DIAGNOSIS — J45909 Unspecified asthma, uncomplicated: Secondary | ICD-10-CM | POA: Diagnosis present

## 2016-07-01 DIAGNOSIS — J4521 Mild intermittent asthma with (acute) exacerbation: Secondary | ICD-10-CM | POA: Insufficient documentation

## 2016-07-01 MED ORDER — IPRATROPIUM BROMIDE 0.02 % IN SOLN
0.5000 mg | Freq: Once | RESPIRATORY_TRACT | Status: AC
  Administered 2016-07-01: 0.5 mg via RESPIRATORY_TRACT
  Filled 2016-07-01: qty 2.5

## 2016-07-01 MED ORDER — PREDNISONE 20 MG PO TABS
60.0000 mg | ORAL_TABLET | Freq: Once | ORAL | Status: AC
  Administered 2016-07-01: 60 mg via ORAL
  Filled 2016-07-01: qty 3

## 2016-07-01 MED ORDER — ALBUTEROL SULFATE (2.5 MG/3ML) 0.083% IN NEBU
5.0000 mg | INHALATION_SOLUTION | Freq: Once | RESPIRATORY_TRACT | Status: AC
  Administered 2016-07-01: 5 mg via RESPIRATORY_TRACT
  Filled 2016-07-01: qty 6

## 2016-07-01 MED ORDER — ALBUTEROL SULFATE HFA 108 (90 BASE) MCG/ACT IN AERS
2.0000 | INHALATION_SPRAY | Freq: Once | RESPIRATORY_TRACT | Status: DC
  Filled 2016-07-01: qty 6.7

## 2016-07-01 MED ORDER — PREDNISONE 20 MG PO TABS: 40.0000 mg | ORAL_TABLET | Freq: Every day | ORAL | 0 refills | Status: DC

## 2016-07-01 MED ORDER — AZITHROMYCIN 250 MG PO TABS: ORAL_TABLET | ORAL | 0 refills | Status: DC

## 2016-07-01 NOTE — ED Triage Notes (Signed)
The pt has asthma and for one week he has had more difficulty breathing.  He also ran ourt of his inhaler one week ago.  He has been coughing and waking up sweating

## 2016-07-01 NOTE — ED Provider Notes (Signed)
[facility]EMERGENCY DEPT Provider Note   CSN: [id number] Arrival date & time: 07/01/16  [id number]     History   Chief Complaint Chief Complaint  Patient presents with  . Asthma    HPI Mark [name] is a 65 y.o. male hx of asthma, HTN, Here presenting with shortness of breath, cough. Patient states that he has a history of asthma and is still smoking cigarettes. He has been having nonproductive cough for the last week or so. Denies any fevers or chills. Denies any vomiting. Patient came to the ER several months ago for asthma exacerbation and was sent home with steroids. He states that it has been several years since he had an admission for asthma. He is also out of albuterol for the last week or so.    The history is provided by the patient.    Past Medical History:  Diagnosis Date  . Asthma   . Hypertension     There are no active problems to display for this patient.   History reviewed. No pertinent surgical history.     Home Medications    Prior to Admission medications   Medication Sig Start Date End Date Taking? Authorizing Provider  albuterol (PROVENTIL HFA;VENTOLIN HFA) 108 (90 Base) MCG/ACT inhaler Inhale 1-2 puffs into the lungs every 6 (six) hours as needed for wheezing or shortness of breath. 11/29/15  Yes Danelle BerryLeisa Tapia, PA-C  amLODipine (NORVASC) 10 MG tablet Take 1 tablet (10 mg total) by mouth daily. 11/29/15  Yes Danelle BerryLeisa Tapia, PA-C  hydrochlorothiazide (HYDRODIURIL) 12.5 MG tablet Take 1 tablet (12.5 mg total) by mouth daily. 11/29/15  Yes Danelle BerryLeisa Tapia, PA-C  montelukast (SINGULAIR) 10 MG tablet Take 10 mg by mouth daily. 06/11/16  Yes Historical Provider, MD  Multiple Vitamin (MULTIVITAMIN WITH MINERALS) TABS tablet Take 1 tablet by mouth daily.   Yes Historical Provider, MD  predniSONE (DELTASONE) 20 MG tablet Take 2 tablets (40 mg total) by mouth daily. Patient taking differently: Take 20 mg by mouth daily.  11/29/15  Yes Danelle BerryLeisa Tapia, PA-C  albuterol (PROVENTIL) (2.5  MG/3ML) 0.083% nebulizer solution Take 3 mLs (2.5 mg total) by nebulization every 6 (six) hours as needed for wheezing or shortness of breath. Patient not taking: Reported on 07/01/2016 11/29/15   Danelle BerryLeisa Tapia, PA-C  cetirizine (ZYRTEC ALLERGY) 10 MG tablet Take 1 tablet (10 mg total) by mouth daily. Patient not taking: Reported on 07/01/2016 11/29/15   Danelle BerryLeisa Tapia, PA-C    Family History No family history on file.  Social History Social History  Substance Use Topics  . Smoking status: Current Every Day Smoker    Packs/day: 0.50    Types: Cigarettes  . Smokeless tobacco: Never Used  . Alcohol use Yes     Comment: 2 beers daily     Allergies   Patient has no known allergies.   Review of Systems Review of Systems  Respiratory: Positive for cough and shortness of breath.   All other systems reviewed and are negative.    Physical Exam Updated Vital Signs BP 156/94   Pulse 93   Temp 98.6 F (37 C) (Oral)   Resp 14   SpO2 96%   Physical Exam  Constitutional:  Slightly tachypneic, overall well appearing   HENT:  Head: Normocephalic.  Mouth/Throat: Oropharynx is clear and moist.  Eyes: EOM are normal. Pupils are equal, round, and reactive to light.  Neck: Normal range of motion. Neck supple.  Cardiovascular: Normal rate, regular rhythm and normal heart sounds.  Pulmonary/Chest:  Slightly tachypneic, mild wheezing throughout, no crackles   Abdominal: Soft. Bowel sounds are normal. He exhibits no distension. There is no tenderness. There is no guarding.  Musculoskeletal: Normal range of motion.  Neurological: He is alert.  Skin: Skin is warm.  Psychiatric: He has a normal mood and affect.  Nursing note and vitals reviewed.    ED Treatments / Results  Labs (all labs ordered are listed, but only abnormal results are displayed) Labs Reviewed - No data to display  EKG  EKG Interpretation  Date/Time:  Sunday July 01 2016 06:44:23 EST Ventricular Rate:  99 PR  Interval:  136 QRS Duration: 78 QT Interval:  350 QTC Calculation: 449 R Axis:   71 Text Interpretation:  Sinus rhythm with Premature atrial complexes Otherwise normal ECG No previous ECGs available Confirmed by Mykia Holton  MD, Reid Regas ([id number]) on 07/01/2016 9:08:04 AM       Radiology Dg Chest 2 View  Result Date: 07/01/2016 CLINICAL DATA:  Cough, wheezing, and shortness of breath for 1 week, history asthma, hypertension, light smoker EXAM: CHEST  2 VIEW COMPARISON:  11/29/2015 FINDINGS: Normal heart size, mediastinal contours, and pulmonary vascularity. Bronchitic changes and hyperinflation which could reflect COPD or asthma. Anterior RIGHT basilar atelectasis versus scarring unchanged. Remaining lungs clear. No infiltrate, pleural effusion or pneumothorax. No acute osseous findings. IMPRESSION: Peribronchial thickening and hyperinflation question COPD or asthma. Chronic RIGHT basilar atelectasis versus scarring. Electronically Signed   By: Ulyses SouthwardMark  Boles M.D.   On: 07/01/2016 09:52    Procedures Procedures (including critical care time)  Medications Ordered in ED Medications  albuterol (PROVENTIL HFA;VENTOLIN HFA) 108 (90 Base) MCG/ACT inhaler 2 puff (not administered)  albuterol (PROVENTIL) (2.5 MG/3ML) 0.083% nebulizer solution 5 mg (5 mg Nebulization Given 07/01/16 0702)  ipratropium (ATROVENT) nebulizer solution 0.5 mg (0.5 mg Nebulization Given 07/01/16 0703)  albuterol (PROVENTIL) (2.5 MG/3ML) 0.083% nebulizer solution 5 mg (5 mg Nebulization Given 07/01/16 0913)  predniSONE (DELTASONE) tablet 60 mg (60 mg Oral Given 07/01/16 0952)     Initial Impression / Assessment and Plan / ED Course  I have reviewed the triage vital signs and the nursing notes.  Pertinent labs & imaging results that were available during my care of the patient were reviewed by me and considered in my medical decision making (see chart for details).  Clinical Course    Mark [name] is a [age] y.o. male here with  cough, wheezing. Likely mild asthma exacerbation. Will get CXR to r/o pneumonia. Will give nebs, steroids and reassess.   10:24 AM CXR showed no pneumonia. Likely mild asthma exacerbation. No wheezing after steroids, 2 nebs. Will dc home with prednisone, albuterol, zpack (he is a smoker).   Final Clinical Impressions(s) / ED Diagnoses   Final diagnoses:  None    New Prescriptions New Prescriptions   No medications on file     Charlynne Panderavid Hsienta Tameika Heckmann, MD 07/01/16 1025

## 2016-07-01 NOTE — Discharge Instructions (Signed)
Take zpack as prescribed.   Take prednisone as prescribed.   Use albuterol every 4-6 hrs as needed for cough or wheezing.   Stop smoking.   See your doctor  Return to ER if you have trouble breathing, worse shortness of breath, wheezing, fevers.

## 2016-07-01 NOTE — ED Notes (Signed)
Patient is ready for discharge

## 2016-07-01 NOTE — ED Triage Notes (Signed)
Some respiratory difficulty  No audible wheezes and he still smokes

## 2016-07-03 ENCOUNTER — Emergency Department (HOSPITAL_COMMUNITY)
Admission: EM | Admit: 2016-07-03 | Discharge: 2016-07-03 | Disposition: A | Discharge status: Home / Self Care | Payer: Medicaid Other | Attending: Emergency Medicine | Admitting: Emergency Medicine

## 2016-07-03 ENCOUNTER — Emergency Department (HOSPITAL_COMMUNITY): Payer: Medicaid Other

## 2016-07-03 ENCOUNTER — Encounter (HOSPITAL_COMMUNITY): Payer: Self-pay | Admitting: Emergency Medicine

## 2016-07-03 DIAGNOSIS — J4521 Mild intermittent asthma with (acute) exacerbation: Secondary | ICD-10-CM | POA: Diagnosis not present

## 2016-07-03 DIAGNOSIS — Z79899 Other long term (current) drug therapy: Secondary | ICD-10-CM | POA: Diagnosis not present

## 2016-07-03 DIAGNOSIS — R05 Cough: Secondary | ICD-10-CM | POA: Diagnosis present

## 2016-07-03 DIAGNOSIS — F17210 Nicotine dependence, cigarettes, uncomplicated: Secondary | ICD-10-CM | POA: Diagnosis not present

## 2016-07-03 DIAGNOSIS — R42 Dizziness and giddiness: Secondary | ICD-10-CM | POA: Diagnosis not present

## 2016-07-03 DIAGNOSIS — I10 Essential (primary) hypertension: Secondary | ICD-10-CM | POA: Insufficient documentation

## 2016-07-03 LAB — COMPREHENSIVE METABOLIC PANEL
ALBUMIN: 3.8 g/dL (ref 3.5–5.0)
ALK PHOS: 78 U/L (ref 38–126)
ALT: 60 U/L (ref 17–63)
AST: 60 U/L — AB (ref 15–41)
Anion gap: 11 (ref 5–15)
BILIRUBIN TOTAL: 1.2 mg/dL (ref 0.3–1.2)
BUN: 12 mg/dL (ref 6–20)
CALCIUM: 9.5 mg/dL (ref 8.9–10.3)
CO2: 24 mmol/L (ref 22–32)
CREATININE: 1.24 mg/dL (ref 0.61–1.24)
Chloride: 99 mmol/L — ABNORMAL LOW (ref 101–111)
GFR calc Af Amer: >60 mL/min (ref >60)
GLUCOSE: 114 mg/dL — AB (ref 65–99)
Potassium: 3.6 mmol/L (ref 3.5–5.1)
Sodium: 134 mmol/L — ABNORMAL LOW (ref 135–145)
TOTAL PROTEIN: 7.6 g/dL (ref 6.5–8.1)

## 2016-07-03 LAB — ETHANOL: Alcohol, Ethyl (B): <5 mg/dL (ref <5)

## 2016-07-03 LAB — CBC WITH DIFFERENTIAL/PLATELET
BASOS ABS: 0 10*3/uL (ref 0.0–0.1)
BASOS PCT: 0 %
Eosinophils Absolute: 0 10*3/uL (ref 0.0–0.7)
Eosinophils Relative: 0 %
HEMATOCRIT: 44.8 % (ref 39.0–52.0)
HEMOGLOBIN: 15.7 g/dL (ref 13.0–17.0)
LYMPHS PCT: 14 %
Lymphs Abs: 1.7 10*3/uL (ref 0.7–4.0)
MCH: 33.7 pg (ref 26.0–34.0)
MCHC: 35 g/dL (ref 30.0–36.0)
MCV: 96.1 fL (ref 78.0–100.0)
MONOS PCT: 10 %
Monocytes Absolute: 1.3 10*3/uL — ABNORMAL HIGH (ref 0.1–1.0)
NEUTROS ABS: 9.1 10*3/uL — AB (ref 1.7–7.7)
NEUTROS PCT: 76 %
Platelets: 184 10*3/uL (ref 150–400)
RBC: 4.66 MIL/uL (ref 4.22–5.81)
RDW: 12.5 % (ref 11.5–15.5)
WBC: 12.1 10*3/uL — ABNORMAL HIGH (ref 4.0–10.5)

## 2016-07-03 LAB — I-STAT TROPONIN, ED: Troponin i, poc: 0 ng/mL (ref 0.00–0.08)

## 2016-07-03 MED ORDER — ALBUTEROL SULFATE HFA 108 (90 BASE) MCG/ACT IN AERS: 2.0000 | INHALATION_SPRAY | RESPIRATORY_TRACT | 0 refills | Status: AC | PRN

## 2016-07-03 MED ORDER — PREDNISONE 20 MG PO TABS
60.0000 mg | ORAL_TABLET | Freq: Once | ORAL | Status: AC
  Administered 2016-07-03: 60 mg via ORAL
  Filled 2016-07-03: qty 3

## 2016-07-03 MED ORDER — MECLIZINE HCL 25 MG PO TABS
25.0000 mg | ORAL_TABLET | Freq: Once | ORAL | Status: AC
  Administered 2016-07-03: 25 mg via ORAL
  Filled 2016-07-03: qty 1

## 2016-07-03 MED ORDER — MECLIZINE HCL 25 MG PO TABS: 25.0000 mg | ORAL_TABLET | Freq: Three times a day (TID) | ORAL | 0 refills | Status: DC | PRN

## 2016-07-03 MED ORDER — SODIUM CHLORIDE 0.9 % IV BOLUS (SEPSIS)
1000.0000 mL | Freq: Once | INTRAVENOUS | Status: AC
  Administered 2016-07-03: 1000 mL via INTRAVENOUS

## 2016-07-03 MED ORDER — PREDNISONE 20 MG PO TABS: 40.0000 mg | ORAL_TABLET | Freq: Every day | ORAL | 0 refills | Status: DC

## 2016-07-03 MED ORDER — IPRATROPIUM-ALBUTEROL 0.5-2.5 (3) MG/3ML IN SOLN
3.0000 mL | Freq: Once | RESPIRATORY_TRACT | Status: AC
  Administered 2016-07-03: 3 mL via RESPIRATORY_TRACT
  Filled 2016-07-03: qty 3

## 2016-07-03 NOTE — ED Notes (Signed)
Pt c/o continued dizziness, EDP aware.

## 2016-07-03 NOTE — ED Provider Notes (Signed)
[facility] Provider Note   CSN: [id number] Arrival date & time: 07/03/16  0245  By signing my name below, I, Mark [name], attest that this documentation has been prepared under the direction and in the presence of Mark Batonourtney F Horton, MD . Electronically Signed: Majel HomerPeyton [name], Scribe. 07/03/2016. 3:13 AM.  History   Chief Complaint Chief Complaint  Patient presents with  . Dizziness  . Cough   The history is provided by the patient. No language interpreter was used.   HPI Comments: Mark [name] is a 65 y.o. male with PMHx of asthma and HTN, brought in by EMS to the Emergency Department complaining of gradually worsening, dizziness described as being "off balance" that began ~24 hours PTA. Pt reports he went to bed this evening and awoke ~1 hour ago "in a puddle of sweat." He states associated productive cough, shortness of breath and decreased appetite. He notes he has used his albuterol inhaler with no relief. Pt endorses hx of smoking cigarettes and drinking EtOH; he notes he "drank 1 beer tonight." He denies chest pain, abdominal pain, nausea and vomiting.   Past Medical History:  Diagnosis Date  . Asthma   . Hypertension    There are no active problems to display for this patient.  History reviewed. No pertinent surgical history.  Home Medications    Prior to Admission medications   Medication Sig Start Date End Date Taking? Authorizing Provider  albuterol (PROVENTIL HFA;VENTOLIN HFA) 108 (90 Base) MCG/ACT inhaler Inhale 2 puffs into the lungs every 4 (four) hours as needed for wheezing or shortness of breath. 07/03/16   Mark Batonourtney F Horton, MD  amLODipine (NORVASC) 10 MG tablet Take 1 tablet (10 mg total) by mouth daily. 11/29/15   Mark BerryLeisa Tapia, PA-C  azithromycin (ZITHROMAX Z-PAK) 250 MG tablet Take 500 mg daily x 1 day then 250 mg daily x 4 days 07/01/16   Mark Panderavid Hsienta Yao, MD  cetirizine (ZYRTEC ALLERGY) 10 MG tablet Take 1 tablet (10 mg total) by mouth daily. Patient  not taking: Reported on 07/01/2016 11/29/15   Mark BerryLeisa Tapia, PA-C  hydrochlorothiazide (HYDRODIURIL) 12.5 MG tablet Take 1 tablet (12.5 mg total) by mouth daily. 11/29/15   Mark BerryLeisa Tapia, PA-C  montelukast (SINGULAIR) 10 MG tablet Take 10 mg by mouth daily. 06/11/16   Historical Provider, MD  Multiple Vitamin (MULTIVITAMIN WITH MINERALS) TABS tablet Take 1 tablet by mouth daily.    Historical Provider, MD  predniSONE (DELTASONE) 20 MG tablet Take 2 tablets (40 mg total) by mouth daily. 07/03/16   Mark Batonourtney F Horton, MD    Family History No family history on file.  Social History Social History  Substance Use Topics  . Smoking status: Current Every Day Smoker    Packs/day: 0.50    Types: Cigarettes  . Smokeless tobacco: Never Used  . Alcohol use Yes     Comment: 2 beers daily     Allergies   Patient has no known allergies.   Review of Systems Review of Systems  Constitutional: Positive for diaphoresis. Negative for appetite change and fever.  Respiratory: Positive for cough and shortness of breath.   Cardiovascular: Negative for chest pain.  Gastrointestinal: Negative for abdominal pain, nausea and vomiting.  Neurological: Positive for dizziness. Negative for weakness and numbness.  All other systems reviewed and are negative.  Physical Exam Updated Vital Signs BP 180/92 (BP Location: Right Arm)   Pulse 106   Temp 98.2 F (36.8 C) (Oral)   Resp 19   Ht  6\' 2"  (1.88 m)   Wt 180 lb (81.6 kg)   SpO2 95%   BMI 23.11 kg/m   Physical Exam  Constitutional: He is oriented to person, place, and time. No distress.  HENT:  Head: Normocephalic and atraumatic.  Eyes: Pupils are equal, round, and reactive to light.  Cardiovascular: Regular rhythm and normal heart sounds.   No murmur heard. Tachycardia  Pulmonary/Chest: Effort normal. No respiratory distress. He has wheezes.  Scattered expiratory wheezing, fair air movement  Abdominal: Soft. Bowel sounds are normal. There is no  tenderness. There is no rebound.  Musculoskeletal: He exhibits no edema.  Neurological: He is alert and oriented to person, place, and time.  Cranial nerves II through XII intact, 5 out of 5 strength in all 4 extremities, no dysmetria to finger-nose-finger  Skin: Skin is warm and dry.  Psychiatric: He has a normal mood and affect.  Nursing note and vitals reviewed.    ED Treatments / Results  Labs (all labs ordered are listed, but only abnormal results are displayed) Labs Reviewed  CBC WITH DIFFERENTIAL/PLATELET - Abnormal; Notable for the following:       Result Value   WBC 12.1 (*)    Neutro Abs 9.1 (*)    Monocytes Absolute 1.3 (*)    All other components within normal limits  COMPREHENSIVE METABOLIC PANEL - Abnormal; Notable for the following:    Sodium 134 (*)    Chloride 99 (*)    Glucose, Bld 114 (*)    AST 60 (*)    All other components within normal limits  ETHANOL  I-STAT TROPOININ, ED    EKG  EKG Interpretation  Date/Time:  Tuesday July 03 2016 02:48:58 EST Ventricular Rate:  111 PR Interval:    QRS Duration: 86 QT Interval:  350 QTC Calculation: 476 R Axis:   74 Text Interpretation:  Sinus tachycardia Multiform ventricular premature complexes Borderline prolonged QT interval Confirmed by Wilkie Aye  MD, COURTNEY ([id number]) on 07/03/2016 3:27:45 AM       Radiology Dg Chest 2 View  Result Date: 07/03/2016 CLINICAL DATA:  Shortness of breath, dizziness. EXAM: CHEST  2 VIEW COMPARISON:  07/01/2016 at 0934 hour FINDINGS: Normal heart size and mediastinal contours. Unchanged hyperinflation and bronchial thickening. Right basilar atelectasis versus scarring, unchanged. Minimal left basilar atelectasis is new. No confluent airspace disease. No pleural fluid or pneumothorax. No acute osseous abnormality. IMPRESSION: Unchanged right basilar atelectasis or scarring, new minimal left basilar atelectasis. Stable hyperinflation and bronchial thickening. Electronically  Signed   By: Mark [name] M.D.   On: 07/03/2016 03:44   Dg Chest 2 View  Result Date: 07/01/2016 CLINICAL DATA:  Cough, wheezing, and shortness of breath for 1 week, history asthma, hypertension, light smoker EXAM: CHEST  2 VIEW COMPARISON:  11/29/2015 FINDINGS: Normal heart size, mediastinal contours, and pulmonary vascularity. Bronchitic changes and hyperinflation which could reflect COPD or asthma. Anterior RIGHT basilar atelectasis versus scarring unchanged. Remaining lungs clear. No infiltrate, pleural effusion or pneumothorax. No acute osseous findings. IMPRESSION: Peribronchial thickening and hyperinflation question COPD or asthma. Chronic RIGHT basilar atelectasis versus scarring. Electronically Signed   By: Ulyses Southward M.D.   On: 07/01/2016 09:52    Procedures Procedures (including critical care time)  Medications Ordered in ED Medications  sodium chloride 0.9 % bolus 1,000 mL (1,000 mLs Intravenous New Bag/Given 07/03/16 0346)  meclizine (ANTIVERT) tablet 25 mg (25 mg Oral Given 07/03/16 0453)  ipratropium-albuterol (DUONEB) 0.5-2.5 (3) MG/3ML nebulizer solution 3 mL (3  mLs Nebulization Given 07/03/16 0544)  predniSONE (DELTASONE) tablet 60 mg (60 mg Oral Given 07/03/16 0544)    DIAGNOSTIC STUDIES:  Oxygen Saturation is 95% on RA, normal by my interpretation.    COORDINATION OF CARE:  3:12 AM Discussed treatment plan with pt at bedside and pt agreed to plan.  Initial Impression / Assessment and Plan / ED Course  I have reviewed the triage vital signs and the nursing notes.  Pertinent labs & imaging results that were available during my care of the patient were reviewed by me and considered in my medical decision making (see chart for details).  Clinical Course     Patient presents with cough, shortness breath, dizziness. Nontoxic on exam. Vital signs reassuring. He is wheezing but has fair air movement. No neurologic deficits noted. Patient given fluids and meclizine.  Was given a DuoNeb and prednisone for presumed asthma exacerbation.  6:17 AM Chart reviewed and lab work is reassuring. Patient is not requiring any oxygen. He is ambulatory independently and does report some improvement of his dizziness. He was able to ambulate and maintain pulse ox greater than 95%. Will discharge home with prednisone.  After history, exam, and medical workup I feel the patient has been appropriately medically screened and is safe for discharge home. Pertinent diagnoses were discussed with the patient. Patient was given return precautions.  I personally performed the services described in this documentation, which was scribed in my presence. The recorded information has been reviewed and is accurate.   Final Clinical Impressions(s) / ED Diagnoses   Final diagnoses:  Mild intermittent asthma with exacerbation  Dizziness    New Prescriptions New Prescriptions   ALBUTEROL (PROVENTIL HFA;VENTOLIN HFA) 108 (90 BASE) MCG/ACT INHALER    Inhale 2 puffs into the lungs every 4 (four) hours as needed for wheezing or shortness of breath.   PREDNISONE (DELTASONE) 20 MG TABLET    Take 2 tablets (40 mg total) by mouth daily.     Mark Batonourtney F Horton, MD 07/03/16 [phone]

## 2016-07-03 NOTE — ED Notes (Signed)
Pt refused orthostatic vital signs. EDP aware.

## 2016-07-03 NOTE — ED Triage Notes (Signed)
Per EMS, pt from home with c/o dizziness, diaphoresis and feeling "unwell". Pt has a productive cough. Pt seen recently for same and diagnosed with asthma. BP-174/114, HR-104, SpO2-92% room air.

## 2016-07-03 NOTE — ED Notes (Signed)
Pt ambulated in the hall without complaint. SpO2 maintained between 95-99% on room air. EDP aware.

## 2017-02-21 ENCOUNTER — Encounter: Payer: Self-pay | Admitting: Internal Medicine

## 2019-01-17 ENCOUNTER — Other Ambulatory Visit: Payer: Self-pay

## 2019-01-17 DIAGNOSIS — Z20822 Contact with and (suspected) exposure to covid-19: Secondary | ICD-10-CM

## 2019-01-23 LAB — NOVEL CORONAVIRUS, NAA: SARS-CoV-2, NAA: NOT DETECTED

## 2019-01-24 ENCOUNTER — Emergency Department (HOSPITAL_COMMUNITY): Payer: Medicare HMO

## 2019-01-24 ENCOUNTER — Other Ambulatory Visit: Payer: Self-pay

## 2019-01-24 ENCOUNTER — Emergency Department (HOSPITAL_COMMUNITY)
Admission: EM | Admit: 2019-01-24 | Discharge: 2019-01-24 | Disposition: A | Discharge status: Home / Self Care | Payer: Medicare HMO | Attending: Emergency Medicine | Admitting: Emergency Medicine

## 2019-01-24 DIAGNOSIS — I10 Essential (primary) hypertension: Secondary | ICD-10-CM | POA: Insufficient documentation

## 2019-01-24 DIAGNOSIS — Z20828 Contact with and (suspected) exposure to other viral communicable diseases: Secondary | ICD-10-CM | POA: Insufficient documentation

## 2019-01-24 DIAGNOSIS — Z79899 Other long term (current) drug therapy: Secondary | ICD-10-CM | POA: Insufficient documentation

## 2019-01-24 DIAGNOSIS — J45901 Unspecified asthma with (acute) exacerbation: Secondary | ICD-10-CM | POA: Diagnosis not present

## 2019-01-24 DIAGNOSIS — R0602 Shortness of breath: Secondary | ICD-10-CM | POA: Diagnosis present

## 2019-01-24 DIAGNOSIS — F17210 Nicotine dependence, cigarettes, uncomplicated: Secondary | ICD-10-CM | POA: Diagnosis not present

## 2019-01-24 MED ORDER — ALBUTEROL SULFATE HFA 108 (90 BASE) MCG/ACT IN AERS
INHALATION_SPRAY | RESPIRATORY_TRACT | Status: AC
  Filled 2019-01-24: qty 6.7

## 2019-01-24 MED ORDER — PREDNISONE 20 MG PO TABS: 40.0000 mg | ORAL_TABLET | Freq: Every day | ORAL | 0 refills | Status: DC

## 2019-01-24 MED ORDER — ALBUTEROL SULFATE HFA 108 (90 BASE) MCG/ACT IN AERS
3.0000 | INHALATION_SPRAY | RESPIRATORY_TRACT | Status: DC | PRN
  Administered 2019-01-24: 3 via RESPIRATORY_TRACT

## 2019-01-24 MED ORDER — PREDNISONE 20 MG PO TABS
60.0000 mg | ORAL_TABLET | Freq: Once | ORAL | Status: AC
  Administered 2019-01-24: 60 mg via ORAL
  Filled 2019-01-24: qty 3

## 2019-01-24 NOTE — ED Triage Notes (Signed)
Pt from home with shob, feels jittery x 1 week. Tachy at 123. Speaking in complete sentences.

## 2019-01-24 NOTE — ED Provider Notes (Signed)
[name] Provider Note   CSN: [id number] Arrival date & time: 01/24/19  1030    History   Chief Complaint Chief Complaint  Patient presents with  . Shortness of Breath    HPI [name] a 68 y.o. male.     HPI Patient has history of asthma.  States for the last week [name] been feeling more jittery and short of breath.  States [name] in inhaler helps somewhat but then will recur.  No fevers or chills.  No real cough.  No chest pain.  No swelling his legs.  States he does have his doctor that he sees for this.  He [name] a smoker.  Patient counseled on cessation.  States he has been wheezing. Past Medical History:  Diagnosis Date  . Asthma   . Hypertension     There are no active problems to display for this patient.   No past surgical history on file.      Home Medications    Prior to Admission medications   Medication Sig Start Date End Date Taking? Authorizing Provider  albuterol (PROVENTIL HFA;VENTOLIN HFA) 108 (90 Base) MCG/ACT inhaler Inhale 2 puffs into the lungs every 4 (four) hours as needed for wheezing or shortness of breath. 07/03/16   Horton, Mayer Maskerourtney F, MD  amLODipine (NORVASC) 10 MG tablet Take 1 tablet (10 mg total) by mouth daily. 11/29/15   Danelle Berryapia, Leisa, PA-C  azithromycin (ZITHROMAX Z-PAK) 250 MG tablet Take 500 mg daily x 1 day then 250 mg daily x 4 days 07/01/16   Charlynne PanderYao, David Hsienta, MD  cetirizine (ZYRTEC ALLERGY) 10 MG tablet Take 1 tablet (10 mg total) by mouth daily. Patient not taking: Reported on 07/01/2016 11/29/15   Danelle Berryapia, Leisa, PA-C  hydrochlorothiazide (HYDRODIURIL) 12.5 MG tablet Take 1 tablet (12.5 mg total) by mouth daily. 11/29/15   Danelle Berryapia, Leisa, PA-C  meclizine (ANTIVERT) 25 MG tablet Take 1 tablet (25 mg total) by mouth 3 (three) times daily as needed for dizziness. 07/03/16   Horton, Mayer Maskerourtney F, MD  montelukast (SINGULAIR) 10 MG tablet Take 10 mg by mouth daily. 06/11/16   [provider]   Multiple Vitamin (MULTIVITAMIN WITH MINERALS) TABS tablet Take 1 tablet by mouth daily.    [provider]  predniSONE (DELTASONE) 20 MG tablet Take 2 tablets (40 mg total) by mouth daily. 01/24/19   Benjiman CorePickering, Rheda Kassab, MD    Family History No family history on file.  Social History Social History   Tobacco Use  . Smoking status: Current Every Day Smoker    Packs/day: 0.50    Types: Cigarettes  . Smokeless tobacco: Never Used  Substance Use Topics  . Alcohol use: Yes    Comment: 2 beers daily  . Drug use: No     Allergies   Patient has no known allergies.   Review of Systems Review of Systems  Constitutional: Negative for appetite change.  HENT: Negative for congestion.   Respiratory: Positive for shortness of breath and wheezing.   Cardiovascular: Negative for chest pain and leg swelling.  Gastrointestinal: Negative for abdominal distention.  Genitourinary: Negative for flank pain.  Musculoskeletal: Negative for back pain.  Neurological: Negative for weakness.  Psychiatric/Behavioral: Negative for confusion.     Physical Exam Updated Vital Signs BP (!) 182/101   Pulse 85   Temp 98.8 F (37.1 C) (Oral)   Resp 16   SpO2 95%   Physical Exam Vitals signs and nursing note reviewed.  Constitutional:  Appearance: He [name] well-developed.  HENT:     Head: Normocephalic.  Eyes:     Pupils: Pupils are equal, round, and reactive to light.  Cardiovascular:     Rate and Rhythm: Tachycardia present.  Pulmonary:     Breath sounds: Wheezing present.     Comments: Diffuse wheezes and mildly prolonged expirations Chest:     Chest wall: No tenderness.  Musculoskeletal:     Right lower leg: No edema.     Left lower leg: No edema.  Skin:    General: Skin [name] warm.     Capillary Refill: Capillary refill takes less than 2 seconds.  Neurological:     General: No focal deficit present.     Mental Status: He [name] alert.      ED Treatments / Results  Labs  (all labs ordered are listed, but only abnormal results are displayed) Labs Reviewed  NOVEL CORONAVIRUS, NAA ([name] ORDER, SEND-OUT TO REF LAB)    EKG None  Radiology Dg Chest Portable 1 View  Result Date: 01/24/2019 CLINICAL DATA:  One-week history of shortness of breath. Acute onset of tachycardia (heart rate 123). Current history of hypertension and asthma. EXAM: PORTABLE CHEST 1 VIEW COMPARISON:  07/03/2016 and earlier. FINDINGS: Cardiac silhouette upper normal in size, unchanged. Thoracic aorta mildly tortuous, unchanged. Stable mild hyperinflation. Mildly prominent bronchovascular markings diffusely and mild central peribronchial thickening, unchanged. Lungs otherwise clear. No localized airspace consolidation. No pleural effusions. No pneumothorax. Normal pulmonary vascularity. IMPRESSION: Stable mild changes of chronic bronchitis and/or asthma. No acute cardiopulmonary disease. Electronically Signed   By: Evangeline Dakin M.D.   On: 01/24/2019 11:05    Procedures Procedures (including critical care time)  Medications Ordered in ED Medications  albuterol (VENTOLIN HFA) 108 (90 Base) MCG/ACT inhaler 3 puff (3 puffs Inhalation Given 01/24/19 1047)  albuterol (VENTOLIN HFA) 108 (90 Base) MCG/ACT inhaler ( Inhalation Not Given 01/24/19 1056)  predniSONE (DELTASONE) tablet 60 mg (60 mg Oral Given 01/24/19 1046)     Initial Impression / Assessment and Plan / ED Course  I have reviewed the triage vital signs and the nursing notes.  Pertinent labs & imaging results that were available during my care of the patient were reviewed by me and considered in my medical decision making (see chart for details).        Patient with shortness of breath.  History of asthma.  X-ray reassuring.  Feels better after treatment.  Discharge home with steroids.  Outpatient follow-up as needed.  Final Clinical Impressions(s) / ED Diagnoses   Final diagnoses:  Exacerbation of asthma, unspecified  asthma severity, unspecified whether persistent    ED Discharge Orders         Ordered    predniSONE (DELTASONE) 20 MG tablet  Daily     01/24/19 1224           Davonna Belling, MD 01/24/19 1525

## 2019-01-24 NOTE — ED Notes (Signed)
Now that pt is settled and sitting HR 107

## 2019-01-27 LAB — NOVEL CORONAVIRUS, NAA (HOSP ORDER, SEND-OUT TO REF LAB; TAT 18-24 HRS): SARS-CoV-2, NAA: NOT DETECTED

## 2019-07-01 ENCOUNTER — Encounter: Payer: Self-pay | Admitting: Internal Medicine

## 2019-08-06 ENCOUNTER — Ambulatory Visit: Payer: Medicare HMO | Admitting: Internal Medicine

## 2019-09-09 ENCOUNTER — Other Ambulatory Visit: Payer: Self-pay

## 2019-09-09 ENCOUNTER — Emergency Department (HOSPITAL_COMMUNITY): Payer: Medicare HMO

## 2019-09-09 ENCOUNTER — Emergency Department (HOSPITAL_COMMUNITY)
Admission: EM | Admit: 2019-09-09 | Discharge: 2019-09-09 | Disposition: A | Discharge status: Home / Self Care | Payer: Medicare HMO | Attending: Emergency Medicine | Admitting: Emergency Medicine

## 2019-09-09 ENCOUNTER — Encounter (HOSPITAL_COMMUNITY): Payer: Self-pay

## 2019-09-09 DIAGNOSIS — F17210 Nicotine dependence, cigarettes, uncomplicated: Secondary | ICD-10-CM | POA: Diagnosis not present

## 2019-09-09 DIAGNOSIS — W0110XA Fall on same level from slipping, tripping and stumbling with subsequent striking against unspecified object, initial encounter: Secondary | ICD-10-CM | POA: Diagnosis not present

## 2019-09-09 DIAGNOSIS — Z20822 Contact with and (suspected) exposure to covid-19: Secondary | ICD-10-CM | POA: Insufficient documentation

## 2019-09-09 DIAGNOSIS — S0181XA Laceration without foreign body of other part of head, initial encounter: Secondary | ICD-10-CM | POA: Insufficient documentation

## 2019-09-09 DIAGNOSIS — Y999 Unspecified external cause status: Secondary | ICD-10-CM | POA: Insufficient documentation

## 2019-09-09 DIAGNOSIS — S0990XA Unspecified injury of head, initial encounter: Secondary | ICD-10-CM | POA: Diagnosis present

## 2019-09-09 DIAGNOSIS — I10 Essential (primary) hypertension: Secondary | ICD-10-CM | POA: Insufficient documentation

## 2019-09-09 DIAGNOSIS — Y929 Unspecified place or not applicable: Secondary | ICD-10-CM | POA: Insufficient documentation

## 2019-09-09 DIAGNOSIS — W19XXXA Unspecified fall, initial encounter: Secondary | ICD-10-CM

## 2019-09-09 DIAGNOSIS — Y93K1 Activity, walking an animal: Secondary | ICD-10-CM | POA: Insufficient documentation

## 2019-09-09 DIAGNOSIS — Z23 Encounter for immunization: Secondary | ICD-10-CM | POA: Insufficient documentation

## 2019-09-09 DIAGNOSIS — J45909 Unspecified asthma, uncomplicated: Secondary | ICD-10-CM | POA: Diagnosis not present

## 2019-09-09 LAB — SARS CORONAVIRUS 2 (TAT 6-24 HRS): SARS Coronavirus 2: NEGATIVE

## 2019-09-09 MED ORDER — LIDOCAINE-EPINEPHRINE 2 %-1:100000 IJ SOLN
10.0000 mL | Freq: Once | INTRAMUSCULAR | Status: DC
  Filled 2019-09-09: qty 1

## 2019-09-09 MED ORDER — TETANUS-DIPHTH-ACELL PERTUSSIS 5-2.5-18.5 LF-MCG/0.5 IM SUSP
0.5000 mL | Freq: Once | INTRAMUSCULAR | Status: AC
  Administered 2019-09-09: 02:00:00 0.5 mL via INTRAMUSCULAR
  Filled 2019-09-09: qty 0.5

## 2019-09-09 NOTE — Discharge Instructions (Signed)
The stitches that were placed today are dissolvable, they do not need to be taken out. Wash your cuts and scrapes at least once a day with soap and water. Follow-up with your primary care doctor as needed for reevaluation of your scrapes. Your imaging today was reassuring, there is no broken bones in your head or your neck.  There is no bleeding inside your head. You were tested for coronavirus today as requested.  Results are pending.  If positive, you will receive a phone call.  If negative, you will not.  Either way, you may check online on MyChart. 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 Return to the emergency room with any new, worsening, concerning symptoms.

## 2019-09-09 NOTE — ED Provider Notes (Signed)
[facility]EM[facility]ENCY DEPT Provider Note   CSN: [id number] Arrival date & time: 09/09/19  0015     History Chief Complaint  Patient presents with   Marletta Lor    [name] is a 69 y.o. male presenting for evaluation of head laceration and fall.  Patient states just prior to arrival he was walking his dog outside when he tripped over the dog and fell onto his right side. He hit his head on the ground, is not sure if he lost consciousness but thinks he might of for a few seconds. He reports a mild headache. He has ambulated since. He is not on blood thinners. He has not taken anything for pain. Patient states he has been drinking alcohol tonight. He reports some mild low neck pain. He denies back pain or hip pain.  Additional history obtained from chart review. Patient with a history of asthma and hypertension. He does not have any documented anticoagulants.   HPI     Past Medical History:  Diagnosis Date   Allergic rhinitis    Asthma    BPH (benign prostatic hyperplasia)    Carpal tunnel syndrome    Hypertension     There are no problems to display for this patient.   History reviewed. No pertinent surgical history.     No family history on file.  Social History   Tobacco Use   Smoking status: Current Every Day Smoker    Packs/day: 0.50    Types: Cigarettes   Smokeless tobacco: Never Used  Substance Use Topics   Alcohol use: Yes    Comment: 2 beers daily   Drug use: No    Home Medications Prior to Admission medications   Medication Sig Start Date End Date Taking? Authorizing Provider  albuterol (PROVENTIL HFA;VENTOLIN HFA) 108 (90 Base) MCG/ACT inhaler Inhale 2 puffs into the lungs every 4 (four) hours as needed for wheezing or shortness of breath. 07/03/16   Horton, Mayer Masker, MD  amLODipine (NORVASC) 10 MG tablet Take 1 tablet (10 mg total) by mouth daily. 11/29/15   Danelle Berry, PA-C  azithromycin (ZITHROMAX Z-PAK) 250 MG  tablet Take 500 mg daily x 1 day then 250 mg daily x 4 days 07/01/16   Charlynne Pander, MD  cetirizine (ZYRTEC ALLERGY) 10 MG tablet Take 1 tablet (10 mg total) by mouth daily. Patient not taking: Reported on 07/01/2016 11/29/15   Danelle Berry, PA-C  hydrochlorothiazide (HYDRODIURIL) 12.5 MG tablet Take 1 tablet (12.5 mg total) by mouth daily. 11/29/15   Danelle Berry, PA-C  meclizine (ANTIVERT) 25 MG tablet Take 1 tablet (25 mg total) by mouth 3 (three) times daily as needed for dizziness. 07/03/16   Horton, Mayer Masker, MD  montelukast (SINGULAIR) 10 MG tablet Take 10 mg by mouth daily. 06/11/16   [provider]  Multiple Vitamin (MULTIVITAMIN WITH MINERALS) TABS tablet Take 1 tablet by mouth daily.    [provider]  predniSONE (DELTASONE) 20 MG tablet Take 2 tablets (40 mg total) by mouth daily. 01/24/19   Benjiman Core, MD    Allergies    Patient has no known allergies.  Review of Systems   Review of Systems  Musculoskeletal: Positive for neck pain.  Skin: Positive for wound.  Neurological: Positive for headaches.  All other systems reviewed and are negative.   Physical Exam Updated Vital Signs BP (!) 158/92    Pulse 93    Temp 97.7 F (36.5 C) (Oral)    Resp 20  Ht 6\' 2"  (1.88 m)    Wt 75 kg    SpO2 94%    BMI 21.23 kg/m   Physical Exam Vitals and nursing note reviewed.  Constitutional:      General: He is not in acute distress.    Appearance: He is well-developed.     Comments: Resting comfortably in bed, in no acute distress  HENT:     Head: Normocephalic.      Comments: Abrasion of the right temple and cheek. Laceration just to the lateral aspect of the right eyebrow. Laceration is approximately 2 cm long, approximately 3 mm deep. No hemotympanum or nasal septal hematoma. No trismus or malocclusion. Eyes:     Conjunctiva/sclera: Conjunctivae normal.     Pupils: Pupils are equal, round, and reactive to light.  Neck:     Comments: Pt reports  tenderness palpation of midline C-spine. Cardiovascular:     Rate and Rhythm: Normal rate and regular rhythm.     Pulses: Normal pulses.  Pulmonary:     Effort: Pulmonary effort is normal. No respiratory distress.     Breath sounds: Normal breath sounds. No wheezing.  Abdominal:     General: There is no distension.     Palpations: Abdomen is soft. There is no mass.     Tenderness: There is no abdominal tenderness. There is no guarding or rebound.  Musculoskeletal:        General: Normal range of motion.     Cervical back: Tenderness present.     Comments: No tenderness palpation of back or midline spine. No pelvis tenderness. No obvious deformity of upper or lower extremities. Superficial abrasion of the right upper arm without laceration or bleeding.  Skin:    General: Skin is warm and dry.     Capillary Refill: Capillary refill takes less than 2 seconds.  Neurological:     Mental Status: He is alert and oriented to person, place, and time.     Comments: Appears intoxicated, but alert to person, place, time, event.     ED Results / Procedures / Treatments   Labs (all labs ordered are listed, but only abnormal results are displayed) Labs Reviewed  SARS CORONAVIRUS 2 (TAT 6-24 HRS)    EKG None  Radiology CT Head Wo Contrast  Result Date: 09/09/2019 CLINICAL DATA:  Head trauma, fall EXAM: CT HEAD WITHOUT CONTRAST TECHNIQUE: Contiguous axial images were obtained from the base of the skull through the vertex without intravenous contrast. COMPARISON:  None. FINDINGS: Brain: No evidence of acute territorial infarction, hemorrhage, hydrocephalus,extra-axial collection or mass lesion/mass effect. There is mild dilatation the ventricles and sulci consistent with age-related atrophy. Low-attenuation changes in the deep white matter consistent with small vessel ischemia. Vascular: No hyperdense vessel or unexpected calcification. Skull: The skull is intact. No fracture or focal lesion  identified. Sinuses/Orbits: The visualized paranasal sinuses and mastoid air cells are clear. The orbits and globes intact. Other: Small area of soft tissue swelling seen over the right frontal skull. Face: Osseous: No acute fracture or other significant osseous abnormality.The nasal bone, mandibles, zygomatic arches and pterygoid plates are intact. Orbits: No fracture identified. Unremarkable appearance of globes and orbits. Sinuses: The visualized paranasal sinuses and mastoid air cells are unremarkable. Soft tissues: Soft tissue swelling seen over the right frontal skull and periorbital region. Limited intracranial: No acute findings. Cervical spine: Alignment: Straightening of the normal cervical lordosis. Skull base and vertebrae: Visualized skull base is intact. No atlanto-occipital dissociation. The vertebral body  heights are well maintained. No fracture or pathologic osseous lesion seen. Soft tissues and spinal canal: The visualized paraspinal soft tissues are unremarkable. No prevertebral soft tissue swelling is seen. The spinal canal is grossly unremarkable, no large epidural collection or significant canal narrowing. Disc levels: Multilevel cervical spine spondylosis is seen with large anterior osteophytes, disc osteophyte complex and uncovertebral osteophytes. This is most notable at from C3 through C5 with mild to moderate central canal stenosis and neural foraminal narrowing. Upper chest: The lung apices are clear. Thoracic inlet is within normal limits. Other: None IMPRESSION: No acute intracranial abnormality. Findings consistent with age related atrophy and chronic small vessel ischemia Soft tissue laceration and swelling over the right frontal skull and periorbital region. No acute facial fracture. No acute fracture or malalignment of the spine. Multilevel cervical spine spondylosis most notable from C3 through C5. Electronically Signed   By: Jonna Clark M.D.   On: 09/09/2019 01:25   CT Cervical  Spine Wo Contrast  Result Date: 09/09/2019 CLINICAL DATA:  Head trauma, fall EXAM: CT HEAD WITHOUT CONTRAST TECHNIQUE: Contiguous axial images were obtained from the base of the skull through the vertex without intravenous contrast. COMPARISON:  None. FINDINGS: Brain: No evidence of acute territorial infarction, hemorrhage, hydrocephalus,extra-axial collection or mass lesion/mass effect. There is mild dilatation the ventricles and sulci consistent with age-related atrophy. Low-attenuation changes in the deep white matter consistent with small vessel ischemia. Vascular: No hyperdense vessel or unexpected calcification. Skull: The skull is intact. No fracture or focal lesion identified. Sinuses/Orbits: The visualized paranasal sinuses and mastoid air cells are clear. The orbits and globes intact. Other: Small area of soft tissue swelling seen over the right frontal skull. Face: Osseous: No acute fracture or other significant osseous abnormality.The nasal bone, mandibles, zygomatic arches and pterygoid plates are intact. Orbits: No fracture identified. Unremarkable appearance of globes and orbits. Sinuses: The visualized paranasal sinuses and mastoid air cells are unremarkable. Soft tissues: Soft tissue swelling seen over the right frontal skull and periorbital region. Limited intracranial: No acute findings. Cervical spine: Alignment: Straightening of the normal cervical lordosis. Skull base and vertebrae: Visualized skull base is intact. No atlanto-occipital dissociation. The vertebral body heights are well maintained. No fracture or pathologic osseous lesion seen. Soft tissues and spinal canal: The visualized paraspinal soft tissues are unremarkable. No prevertebral soft tissue swelling is seen. The spinal canal is grossly unremarkable, no large epidural collection or significant canal narrowing. Disc levels: Multilevel cervical spine spondylosis is seen with large anterior osteophytes, disc osteophyte complex and  uncovertebral osteophytes. This is most notable at from C3 through C5 with mild to moderate central canal stenosis and neural foraminal narrowing. Upper chest: The lung apices are clear. Thoracic inlet is within normal limits. Other: None IMPRESSION: No acute intracranial abnormality. Findings consistent with age related atrophy and chronic small vessel ischemia Soft tissue laceration and swelling over the right frontal skull and periorbital region. No acute facial fracture. No acute fracture or malalignment of the spine. Multilevel cervical spine spondylosis most notable from C3 through C5. Electronically Signed   By: Jonna Clark M.D.   On: 09/09/2019 01:25   CT Maxillofacial Wo Contrast  Result Date: 09/09/2019 CLINICAL DATA:  Head trauma, fall EXAM: CT HEAD WITHOUT CONTRAST TECHNIQUE: Contiguous axial images were obtained from the base of the skull through the vertex without intravenous contrast. COMPARISON:  None. FINDINGS: Brain: No evidence of acute territorial infarction, hemorrhage, hydrocephalus,extra-axial collection or mass lesion/mass effect. There is mild dilatation  the ventricles and sulci consistent with age-related atrophy. Low-attenuation changes in the deep white matter consistent with small vessel ischemia. Vascular: No hyperdense vessel or unexpected calcification. Skull: The skull is intact. No fracture or focal lesion identified. Sinuses/Orbits: The visualized paranasal sinuses and mastoid air cells are clear. The orbits and globes intact. Other: Small area of soft tissue swelling seen over the right frontal skull. Face: Osseous: No acute fracture or other significant osseous abnormality.The nasal bone, mandibles, zygomatic arches and pterygoid plates are intact. Orbits: No fracture identified. Unremarkable appearance of globes and orbits. Sinuses: The visualized paranasal sinuses and mastoid air cells are unremarkable. Soft tissues: Soft tissue swelling seen over the right frontal skull  and periorbital region. Limited intracranial: No acute findings. Cervical spine: Alignment: Straightening of the normal cervical lordosis. Skull base and vertebrae: Visualized skull base is intact. No atlanto-occipital dissociation. The vertebral body heights are well maintained. No fracture or pathologic osseous lesion seen. Soft tissues and spinal canal: The visualized paraspinal soft tissues are unremarkable. No prevertebral soft tissue swelling is seen. The spinal canal is grossly unremarkable, no large epidural collection or significant canal narrowing. Disc levels: Multilevel cervical spine spondylosis is seen with large anterior osteophytes, disc osteophyte complex and uncovertebral osteophytes. This is most notable at from C3 through C5 with mild to moderate central canal stenosis and neural foraminal narrowing. Upper chest: The lung apices are clear. Thoracic inlet is within normal limits. Other: None IMPRESSION: No acute intracranial abnormality. Findings consistent with age related atrophy and chronic small vessel ischemia Soft tissue laceration and swelling over the right frontal skull and periorbital region. No acute facial fracture. No acute fracture or malalignment of the spine. Multilevel cervical spine spondylosis most notable from C3 through C5. Electronically Signed   By: Prudencio Pair M.D.   On: 09/09/2019 01:25    Procedures .Marland KitchenLaceration Repair  Date/Time: 09/09/2019 1:59 AM Performed by: Franchot Heidelberg, PA-C Authorized by: Franchot Heidelberg, PA-C   Consent:    Consent obtained:  Verbal   Consent given by:  Patient   Risks discussed:  Infection, pain, poor cosmetic result, poor wound healing, nerve damage and need for additional repair Anesthesia (see MAR for exact dosages):    Anesthesia method:  Local infiltration   Local anesthetic:  Lidocaine 2% WITH epi Laceration details:    Location:  Face   Facial location: Right temple.   Length (cm):  2   Depth (mm):  3 Repair  type:    Repair type:  Simple Pre-procedure details:    Preparation:  Patient was prepped and draped in usual sterile fashion and imaging obtained to evaluate for foreign bodies Exploration:    Wound exploration: entire depth of wound probed and visualized     Wound extent: no muscle damage noted, no nerve damage noted, no underlying fracture noted and no vascular damage noted   Treatment:    Area cleansed with:  Saline   Amount of cleaning:  Standard Skin repair:    Repair method:  Sutures   Suture size:  5-0   Suture material:  Fast-absorbing gut   Suture technique:  Simple interrupted   Number of sutures:  2 Approximation:    Approximation:  Close Post-procedure details:    Dressing:  Open (no dressing)   Patient tolerance of procedure:  Tolerated well, no immediate complications   (including critical care time)  Medications Ordered in ED Medications  lidocaine-EPINEPHrine (XYLOCAINE W/EPI) 2 %-1:100000 (with pres) injection 10 mL (has no administration  in time range)  Tdap (BOOSTRIX) injection 0.5 mL (0.5 mLs Intramuscular Given 09/09/19 0133)    ED Course  I have reviewed the triage vital signs and the nursing notes.  Pertinent labs & imaging results that were available during my care of the patient were reviewed by me and considered in my medical decision making (see chart for details).    MDM Rules/Calculators/A&P                      Pt presenting for evaluation after fall.  Physical exam shows patient appears nontoxic.  He does appear intoxicated, but otherwise alert and oriented.  He has a laceration on his right temple.  As he is intoxicated and fell with a headlight, will obtain imaging of his head, face, neck.  CTs negative for acute findings including fracture or bleed.  On reassessment, patient remains neurologically intact.  Laceration repaired as described above.  Aftercare instructions given. Patient requesting Covid testing.  He has no symptoms, just wants  to be tested.  Discussed results are pending, however do not believe patient needs to quarantine at this time as he has no symptoms.  Patient states he is interested in stopping drinking.  I provided encouragement and placed a consult to peer support.  At this time, patient appears safe for discharge.  Return precautions given.  Patient states he understands and agrees to plan.  Final Clinical Impression(s) / ED Diagnoses Final diagnoses:  Fall, initial encounter  Laceration of other part of head without foreign body, initial encounter    Rx / DC Orders ED Discharge Orders         Ordered    Consult to Peer Support    Provider:  (Not yet assigned)   09/09/19 0145           Alveria Apley, PA-C 09/09/19 0203    Ward, Layla Maw, DO 09/09/19 0215

## 2019-09-09 NOTE — ED Triage Notes (Signed)
Pt brought in by EMS. Pt suffered a fall with positive LOC and hit head. Pt has laceration to right side of forehead. Pt drank 1 bottle of brandy tonight and tripped and fell on the concrete steps while taking the dog out. Pt denies blood thinners. Denies nausea and vomiting

## 2019-09-28 ENCOUNTER — Encounter: Payer: Self-pay | Admitting: Gastroenterology

## 2019-10-28 ENCOUNTER — Encounter (HOSPITAL_COMMUNITY): Payer: Self-pay

## 2019-10-28 ENCOUNTER — Emergency Department (HOSPITAL_COMMUNITY)
Admission: EM | Admit: 2019-10-28 | Discharge: 2019-10-28 | Disposition: A | Discharge status: Home / Self Care | Payer: Medicare HMO | Attending: Emergency Medicine | Admitting: Emergency Medicine

## 2019-10-28 ENCOUNTER — Emergency Department (HOSPITAL_COMMUNITY): Payer: Medicare HMO

## 2019-10-28 ENCOUNTER — Other Ambulatory Visit: Payer: Self-pay

## 2019-10-28 DIAGNOSIS — J45901 Unspecified asthma with (acute) exacerbation: Secondary | ICD-10-CM | POA: Insufficient documentation

## 2019-10-28 DIAGNOSIS — R062 Wheezing: Secondary | ICD-10-CM | POA: Diagnosis present

## 2019-10-28 DIAGNOSIS — R7989 Other specified abnormal findings of blood chemistry: Secondary | ICD-10-CM | POA: Diagnosis not present

## 2019-10-28 DIAGNOSIS — F17210 Nicotine dependence, cigarettes, uncomplicated: Secondary | ICD-10-CM | POA: Diagnosis not present

## 2019-10-28 DIAGNOSIS — R0789 Other chest pain: Secondary | ICD-10-CM | POA: Diagnosis not present

## 2019-10-28 DIAGNOSIS — Z79899 Other long term (current) drug therapy: Secondary | ICD-10-CM | POA: Diagnosis not present

## 2019-10-28 LAB — COMPREHENSIVE METABOLIC PANEL
ALT: 120 U/L — ABNORMAL HIGH (ref 0–44)
AST: 151 U/L — ABNORMAL HIGH (ref 15–41)
Albumin: 4 g/dL (ref 3.5–5.0)
Alkaline Phosphatase: 89 U/L (ref 38–126)
Anion gap: 14 (ref 5–15)
BUN: 7 mg/dL — ABNORMAL LOW (ref 8–23)
CO2: 22 mmol/L (ref 22–32)
Calcium: 9 mg/dL (ref 8.9–10.3)
Chloride: 103 mmol/L (ref 98–111)
Creatinine, Ser: 1 mg/dL (ref 0.61–1.24)
GFR calc Af Amer: >60 mL/min (ref >60)
GFR calc non Af Amer: >60 mL/min (ref >60)
Glucose, Bld: 97 mg/dL (ref 70–99)
Potassium: 3.9 mmol/L (ref 3.5–5.1)
Sodium: 139 mmol/L (ref 135–145)
Total Bilirubin: 0.8 mg/dL (ref 0.3–1.2)
Total Protein: 8.1 g/dL (ref 6.5–8.1)

## 2019-10-28 LAB — CBC
HCT: 46.6 % (ref 39.0–52.0)
Hemoglobin: 16 g/dL (ref 13.0–17.0)
MCH: 33.9 pg (ref 26.0–34.0)
MCHC: 34.3 g/dL (ref 30.0–36.0)
MCV: 98.7 fL (ref 80.0–100.0)
Platelets: 220 10*3/uL (ref 150–400)
RBC: 4.72 MIL/uL (ref 4.22–5.81)
RDW: 11.5 % (ref 11.5–15.5)
WBC: 3.7 10*3/uL — ABNORMAL LOW (ref 4.0–10.5)
nRBC: 0 % (ref 0.0–0.2)

## 2019-10-28 LAB — URINALYSIS, ROUTINE W REFLEX MICROSCOPIC
Bilirubin Urine: NEGATIVE
Glucose, UA: NEGATIVE mg/dL
Hgb urine dipstick: NEGATIVE
Ketones, ur: 5 mg/dL — AB
Leukocytes,Ua: NEGATIVE
Nitrite: NEGATIVE
Protein, ur: 30 mg/dL — AB
Specific Gravity, Urine: 1.017 (ref 1.005–1.030)
pH: 5 (ref 5.0–8.0)

## 2019-10-28 LAB — TROPONIN I (HIGH SENSITIVITY)
Troponin I (High Sensitivity): 7 ng/L (ref <18)
Troponin I (High Sensitivity): 7 ng/L (ref <18)

## 2019-10-28 LAB — LIPASE, BLOOD: Lipase: 38 U/L (ref 11–51)

## 2019-10-28 LAB — AMMONIA: Ammonia: 19 umol/L (ref 9–35)

## 2019-10-28 MED ORDER — ALBUTEROL SULFATE HFA 108 (90 BASE) MCG/ACT IN AERS
2.0000 | INHALATION_SPRAY | Freq: Once | RESPIRATORY_TRACT | Status: AC
  Administered 2019-10-28: 14:00:00 2 via RESPIRATORY_TRACT
  Filled 2019-10-28: qty 6.7

## 2019-10-28 MED ORDER — PREDNISONE 50 MG PO TABS: 50.0000 mg | ORAL_TABLET | Freq: Every day | ORAL | 0 refills | Status: DC

## 2019-10-28 MED ORDER — OMEPRAZOLE 20 MG PO CPDR: 20.0000 mg | DELAYED_RELEASE_CAPSULE | Freq: Every day | ORAL | 0 refills | Status: DC

## 2019-10-28 MED ORDER — PREDNISONE 20 MG PO TABS
60.0000 mg | ORAL_TABLET | Freq: Once | ORAL | Status: AC
  Administered 2019-10-28: 60 mg via ORAL
  Filled 2019-10-28: qty 3

## 2019-10-28 MED ORDER — IPRATROPIUM BROMIDE HFA 17 MCG/ACT IN AERS
2.0000 | INHALATION_SPRAY | Freq: Once | RESPIRATORY_TRACT | Status: AC
  Administered 2019-10-28: 2 via RESPIRATORY_TRACT
  Filled 2019-10-28: qty 12.9

## 2019-10-28 NOTE — ED Provider Notes (Signed)
[facility] EMERGENCY DEPARTMENT Provider Note   CSN: [id number] Arrival date & time: 10/28/19  1029     History Chief Complaint  Patient presents with  . Shortness of Breath  . Rectal Bleeding    Mark [name] is a 69 y.o. male.  The history is provided by the patient and medical records. No language interpreter was used.  Wheezing Severity:  Severe Severity compared to prior episodes:  Similar Onset quality:  Gradual Duration:  1 week Timing:  Intermittent Progression:  Waxing and waning Chronicity:  Recurrent Context: dust and pollens   Relieved by:  None tried Worsened by:  Nothing Associated symptoms: chest tightness and shortness of breath   Associated symptoms: no chest pain, no fatigue, no fever, no headaches, no rash, no sputum production, no stridor and no swollen glands        Past Medical History:  Diagnosis Date  . Allergic rhinitis   . Asthma   . BPH (benign prostatic hyperplasia)   . Carpal tunnel syndrome   . Hypertension     There are no problems to display for this patient.   History reviewed. No pertinent surgical history.     No family history on file.  Social History   Tobacco Use  . Smoking status: Current Every Day Smoker    Packs/day: 0.50    Types: Cigarettes  . Smokeless tobacco: Never Used  Substance Use Topics  . Alcohol use: Yes    Comment: 2 beers daily  . Drug use: No    Home Medications Prior to Admission medications   Medication Sig Start Date End Date Taking? Authorizing Provider  albuterol (PROVENTIL HFA;VENTOLIN HFA) 108 (90 Base) MCG/ACT inhaler Inhale 2 puffs into the lungs every 4 (four) hours as needed for wheezing or shortness of breath. 07/03/16  Yes Horton, Barbette Hair, MD  amLODipine (NORVASC) 10 MG tablet Take 1 tablet (10 mg total) by mouth daily. 11/29/15  Yes Delsa Grana, PA-C  cetirizine (ZYRTEC ALLERGY) 10 MG tablet Take 1 tablet (10 mg total) by mouth daily. 11/29/15  Yes Delsa Grana, PA-C  hydrochlorothiazide (HYDRODIURIL) 12.5 MG tablet Take 1 tablet (12.5 mg total) by mouth daily. 11/29/15  Yes Delsa Grana, PA-C  meclizine (ANTIVERT) 25 MG tablet Take 1 tablet (25 mg total) by mouth 3 (three) times daily as needed for dizziness. 07/03/16  Yes Horton, Barbette Hair, MD  montelukast (SINGULAIR) 10 MG tablet Take 10 mg by mouth daily. 06/11/16  Yes [provider]  Multiple Vitamin (MULTIVITAMIN WITH MINERALS) TABS tablet Take 1 tablet by mouth daily.   Yes [provider]  predniSONE (DELTASONE) 20 MG tablet Take 2 tablets (40 mg total) by mouth daily. 01/24/19  Yes Davonna Belling, MD  azithromycin (ZITHROMAX Z-PAK) 250 MG tablet Take 500 mg daily x 1 day then 250 mg daily x 4 days 07/01/16   Drenda Freeze, MD  [facility] 160-4.5 MCG/ACT inhaler  10/14/19   [provider]  traZODone (DESYREL) 100 MG tablet  09/23/19   [provider]    Allergies    Patient has no known allergies.  Review of Systems   Review of Systems  Constitutional: Negative for chills, diaphoresis, fatigue and fever.  HENT: Negative for congestion.   Eyes: Negative for visual disturbance.  Respiratory: Positive for chest tightness, shortness of breath and wheezing. Negative for sputum production and stridor.   Cardiovascular: Negative for chest pain, palpitations and leg swelling.  Gastrointestinal: Positive for blood in  stool (chronic). Negative for abdominal pain, constipation, diarrhea, nausea and vomiting.  Genitourinary: Negative for dysuria, flank pain and frequency.  Musculoskeletal: Negative for back pain, neck pain and neck stiffness.  Skin: Negative for rash and wound.  Neurological: Negative for light-headedness, numbness and headaches.  Psychiatric/Behavioral: Negative for confusion.  All other systems reviewed and are negative.   Physical Exam Updated Vital Signs BP (!) 175/108   Pulse 84   Temp 98.6 F (37 C) (Oral)   Resp 14   Ht  6\' 2"  (1.88 m)   Wt 77.1 kg   SpO2 95%   BMI 21.83 kg/m   Physical Exam Constitutional:      General: He is not in acute distress.    Appearance: He is well-developed. He is not ill-appearing, toxic-appearing or diaphoretic.  HENT:     Head: Normocephalic and atraumatic.     Right Ear: External ear normal.     Left Ear: External ear normal.     Nose: Nose normal.     Mouth/Throat:     Pharynx: No pharyngeal swelling or oropharyngeal exudate.  Eyes:     Conjunctiva/sclera: Conjunctivae normal.     Pupils: Pupils are equal, round, and reactive to light.  Cardiovascular:     Rate and Rhythm: Normal rate and regular rhythm.     Heart sounds: No murmur.  Pulmonary:     Effort: Pulmonary effort is normal. No tachypnea or respiratory distress.     Breath sounds: No stridor. Wheezing present. No decreased breath sounds, rhonchi or rales.  Chest:     Chest wall: No tenderness.  Abdominal:     Palpations: Abdomen is soft.     Tenderness: There is no abdominal tenderness. There is no guarding or rebound.  Musculoskeletal:        General: Normal range of motion.     Cervical back: Normal range of motion and neck supple.     Right lower leg: No tenderness. No edema.     Left lower leg: No tenderness. No edema.  Skin:    General: Skin is warm.     Findings: No erythema or rash.  Neurological:     General: No focal deficit present.     Mental Status: He is alert and oriented to person, place, and time.     Cranial Nerves: No cranial nerve deficit.     Motor: No abnormal muscle tone.     Coordination: Coordination normal.     Deep Tendon Reflexes: Reflexes normal.  Psychiatric:        Mood and Affect: Mood normal.     ED Results / Procedures / Treatments   Labs (all labs ordered are listed, but only abnormal results are displayed) Labs Reviewed  COMPREHENSIVE METABOLIC PANEL - Abnormal; Notable for the following components:      Result Value   BUN 7 (*)    AST 151 (*)     ALT 120 (*)    All other components within normal limits  CBC - Abnormal; Notable for the following components:   WBC 3.7 (*)    All other components within normal limits  URINALYSIS, ROUTINE W REFLEX MICROSCOPIC - Abnormal; Notable for the following components:   Ketones, ur 5 (*)    Protein, ur 30 (*)    Bacteria, UA RARE (*)    All other components within normal limits  URINE CULTURE  LIPASE, BLOOD  AMMONIA  TROPONIN I (HIGH SENSITIVITY)  TROPONIN I (HIGH SENSITIVITY)  EKG EKG Interpretation  Date/Time:  Wednesday October 28 2019 10:34:50 EDT Ventricular Rate:  103 PR Interval:  152 QRS Duration: 72 QT Interval:  354 QTC Calculation: 463 R Axis:   65 Text Interpretation: Sinus tachycardia Otherwise normal ECG When compared to prior,  less PVC No STEMI Confirmed by Theda Belfast ([id number]) on 10/28/2019 12:41:07 PM   Radiology DG Chest 2 View  Result Date: 10/28/2019 CLINICAL DATA:  69 year old male with history of shortness of breath worsening over the past week. EXAM: CHEST - 2 VIEW COMPARISON:  Chest x-ray 01/24/2019. FINDINGS: Lung volumes are normal. No consolidative airspace disease. No pleural effusions. No pneumothorax. No pulmonary nodule or mass noted. Pulmonary vasculature and the cardiomediastinal silhouette are within normal limits. IMPRESSION: No radiographic evidence of acute cardiopulmonary disease. Electronically Signed   By: Trudie Reed M.D.   On: 10/28/2019 11:08    Procedures Procedures (including critical care time)  Medications Ordered in ED Medications  albuterol (VENTOLIN HFA) 108 (90 Base) MCG/ACT inhaler 2 puff (2 puffs Inhalation Given 10/28/19 1352)  ipratropium (ATROVENT HFA) inhaler 2 puff (2 puffs Inhalation Given 10/28/19 1352)  predniSONE (DELTASONE) tablet 60 mg (60 mg Oral Given 10/28/19 1352)    ED Course  I have reviewed the triage vital signs and the nursing notes.  Pertinent labs & imaging results that were available during my  care of the patient were reviewed by me and considered in my medical decision making (see chart for details).    MDM Rules/Calculators/A&P                      Mark [name] is a 69 y.o. male with a past medical history significant for hypertension, asthma, BPH, and alcohol abuse who presents with wheezing and shortness of breath as well as some chronic rectal bleeding and alcohol abuse.  He says that he has been having wheezing for the last few days that has been getting worse.  He reports he has no inhalers at home.  He thinks this feels like an asthma exacerbation.  He denies significant cough, or congestion.  No fevers or chills.  He reports no abdominal pain but does report occasional rectal bleeding.  He thinks is related to his alcohol intake which she drinks about 5 or 6 many bottles of liquor every day.  He has had 2 bottles of liquor this morning.  He reports he has had scant bleeding at times from his rectum but this is not necessarily new thing.  He denies any urinary changes.  He reports no recent trauma and is resting comfortably on initial evaluation.  On exam, patient has wheezing in all lung fields.  No rhonchi or rales.  Chest and abdomen nontender.  A rectal exam was deferred and he did not want to check this after his hemoglobin returned normal.  Pulses present in all extremities with no leg pain or leg swelling.  EKG shows no STEMI.  Chest x-ray shows no pneumonia.  Work-up was significant for LFT elevation which I suspect is from his alcohol intake chronically and today.  Ammonia not elevated.  Urinalysis does not show infection.     After breathing treatments and steroids, patient is feeling much better and breathing better.  He does not want further work-up and wants to go home.  Patient was encouraged to follow-up with his PCP to discuss outpatient management of his alcohol problems as well as reassessment of his liver function.  With his hemoglobin normal,  he did not want a  rectal exam.  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  He will be given for prep and for Prilosec as well as steroids for the next few days.  He will take inhalers at home.  He understands return precautions and follow-up instructions.  You no other questions or concerns and continue denying chest pain.  Patient discharged in good condition.   Final Clinical Impression(s) / ED Diagnoses Final diagnoses:  Wheezing  Exacerbation of asthma, unspecified asthma severity, unspecified whether persistent  LFT elevation    Rx / DC Orders ED Discharge Orders         Ordered    predniSONE (DELTASONE) 50 MG tablet  Daily     10/28/19 1544    omeprazole (PRILOSEC) 20 MG capsule  Daily,   Status:  Discontinued     10/28/19 1544    omeprazole (PRILOSEC) 20 MG capsule  Daily     10/28/19 1546          Clinical Impression: 1. Wheezing   2. Exacerbation of asthma, unspecified asthma severity, unspecified whether persistent   3. LFT elevation     Disposition: Discharge  Condition: Good  I have discussed the results, Dx and Tx plan with the pt(& family if present). He/she/they expressed understanding and agree(s) with the plan. Discharge instructions discussed at great length. Strict return precautions discussed and pt &/or family have verbalized understanding of the instructions. No further questions at time of discharge.    Discharge Medication List as of 10/28/2019  3:46 PM    START taking these medications   Details  !! predniSONE (DELTASONE) 50 MG tablet Take 1 tablet (50 mg total) by mouth daily., Starting Thu 10/29/2019, Print    omeprazole (PRILOSEC) 20 MG capsule Take 1 capsule (20 mg total) by mouth daily., Starting Wed 10/28/2019, Normal     !! - Potential duplicate medications found. Please discuss with provider.      Follow Up: Inc, [facility] [address] [place] [address] [phone]     [facility] EMERGENCY DEPARTMENT  [address] [id number] mc [place] [address] [phone]       Tanicia Wolaver, Canary Brim, MD 10/28/19 [phone]

## 2019-10-28 NOTE — ED Triage Notes (Signed)
Pt reports asthma exacerbation and sob that has gotten worse over the past week, wheezing noted in triage. Pt also c.o intermittent rectal bleeding, also states he drink alcohol daily, drank 2 mini bottles of liquor this morning. Pt a.o

## 2019-10-28 NOTE — Discharge Instructions (Signed)
Your history and exam today are consistent with asthma exacerbation likely caused by the pollen and temperature changes as we discussed.  After the breathing treatments and steroids, your breathing improved and her wheezing resolved.  Please take steroids the next 4 days starting tomorrow as you received today steroid is already.  We did find elevated liver function which is likely to the alcohol we discussed.  Please follow-up with your primary team for further alcohol management.  We discussed checking your stool for blood but as your breathing is doing better and your hemoglobin was normal, we doubted it would change management at this time.  Please follow-up with your primary doctor for further evaluation of your liver.  Please rest and stay hydrated.  If any symptoms change or worsen, return to the nearest emergency room.

## 2019-10-29 LAB — URINE CULTURE: Culture: NO GROWTH

## 2019-10-30 ENCOUNTER — Ambulatory Visit: Payer: Medicare HMO | Admitting: Gastroenterology

## 2019-11-10 ENCOUNTER — Emergency Department (HOSPITAL_COMMUNITY): Payer: Medicare HMO

## 2019-11-10 ENCOUNTER — Other Ambulatory Visit: Payer: Self-pay

## 2019-11-10 ENCOUNTER — Encounter (HOSPITAL_COMMUNITY): Payer: Self-pay | Admitting: Emergency Medicine

## 2019-11-10 ENCOUNTER — Inpatient Hospital Stay (HOSPITAL_COMMUNITY)
Admission: EM | Admit: 2019-11-10 | Discharge: 2019-11-12 | DRG: 203 | Disposition: A | Discharge status: Home Health | Payer: Medicare HMO | Attending: Family Medicine | Admitting: Family Medicine

## 2019-11-10 DIAGNOSIS — K7010 Alcoholic hepatitis without ascites: Secondary | ICD-10-CM | POA: Diagnosis present

## 2019-11-10 DIAGNOSIS — J45901 Unspecified asthma with (acute) exacerbation: Secondary | ICD-10-CM | POA: Diagnosis not present

## 2019-11-10 DIAGNOSIS — Z20822 Contact with and (suspected) exposure to covid-19: Secondary | ICD-10-CM | POA: Diagnosis present

## 2019-11-10 DIAGNOSIS — F17210 Nicotine dependence, cigarettes, uncomplicated: Secondary | ICD-10-CM | POA: Diagnosis present

## 2019-11-10 DIAGNOSIS — N400 Enlarged prostate without lower urinary tract symptoms: Secondary | ICD-10-CM | POA: Diagnosis present

## 2019-11-10 DIAGNOSIS — F10229 Alcohol dependence with intoxication, unspecified: Secondary | ICD-10-CM | POA: Diagnosis present

## 2019-11-10 DIAGNOSIS — I160 Hypertensive urgency: Secondary | ICD-10-CM | POA: Diagnosis present

## 2019-11-10 DIAGNOSIS — R251 Tremor, unspecified: Secondary | ICD-10-CM

## 2019-11-10 DIAGNOSIS — J4541 Moderate persistent asthma with (acute) exacerbation: Secondary | ICD-10-CM | POA: Diagnosis present

## 2019-11-10 DIAGNOSIS — R7989 Other specified abnormal findings of blood chemistry: Secondary | ICD-10-CM | POA: Diagnosis present

## 2019-11-10 DIAGNOSIS — I10 Essential (primary) hypertension: Secondary | ICD-10-CM | POA: Diagnosis present

## 2019-11-10 DIAGNOSIS — K760 Fatty (change of) liver, not elsewhere classified: Secondary | ICD-10-CM | POA: Diagnosis present

## 2019-11-10 DIAGNOSIS — M4802 Spinal stenosis, cervical region: Secondary | ICD-10-CM | POA: Diagnosis present

## 2019-11-10 DIAGNOSIS — R7401 Elevation of levels of liver transaminase levels: Secondary | ICD-10-CM

## 2019-11-10 DIAGNOSIS — F10129 Alcohol abuse with intoxication, unspecified: Secondary | ICD-10-CM

## 2019-11-10 LAB — CBC WITH DIFFERENTIAL/PLATELET
Abs Immature Granulocytes: 0.02 10*3/uL (ref 0.00–0.07)
Basophils Absolute: 0 10*3/uL (ref 0.0–0.1)
Basophils Relative: 0 %
Eosinophils Absolute: 0.2 10*3/uL (ref 0.0–0.5)
Eosinophils Relative: 3 %
HCT: 39.4 % (ref 39.0–52.0)
Hemoglobin: 13.6 g/dL (ref 13.0–17.0)
Immature Granulocytes: 0 %
Lymphocytes Relative: 19 %
Lymphs Abs: 1.2 10*3/uL (ref 0.7–4.0)
MCH: 34.8 pg — ABNORMAL HIGH (ref 26.0–34.0)
MCHC: 34.5 g/dL (ref 30.0–36.0)
MCV: 100.8 fL — ABNORMAL HIGH (ref 80.0–100.0)
Monocytes Absolute: 0.7 10*3/uL (ref 0.1–1.0)
Monocytes Relative: 12 %
Neutro Abs: 4 10*3/uL (ref 1.7–7.7)
Neutrophils Relative %: 66 %
Platelets: 164 10*3/uL (ref 150–400)
RBC: 3.91 MIL/uL — ABNORMAL LOW (ref 4.22–5.81)
RDW: 12.1 % (ref 11.5–15.5)
WBC: 6.1 10*3/uL (ref 4.0–10.5)
nRBC: 0 % (ref 0.0–0.2)

## 2019-11-10 LAB — AMMONIA: Ammonia: 42 umol/L — ABNORMAL HIGH (ref 9–35)

## 2019-11-10 LAB — COMPREHENSIVE METABOLIC PANEL
ALT: 81 U/L — ABNORMAL HIGH (ref 0–44)
AST: 95 U/L — ABNORMAL HIGH (ref 15–41)
Albumin: 4 g/dL (ref 3.5–5.0)
Alkaline Phosphatase: 84 U/L (ref 38–126)
Anion gap: 13 (ref 5–15)
BUN: 9 mg/dL (ref 8–23)
CO2: 24 mmol/L (ref 22–32)
Calcium: 8.9 mg/dL (ref 8.9–10.3)
Chloride: 102 mmol/L (ref 98–111)
Creatinine, Ser: 0.91 mg/dL (ref 0.61–1.24)
GFR calc Af Amer: >60 mL/min (ref >60)
GFR calc non Af Amer: >60 mL/min (ref >60)
Glucose, Bld: 72 mg/dL (ref 70–99)
Potassium: 3.6 mmol/L (ref 3.5–5.1)
Sodium: 139 mmol/L (ref 135–145)
Total Bilirubin: 0.8 mg/dL (ref 0.3–1.2)
Total Protein: 7.5 g/dL (ref 6.5–8.1)

## 2019-11-10 LAB — URINALYSIS, ROUTINE W REFLEX MICROSCOPIC
Bilirubin Urine: NEGATIVE
Glucose, UA: NEGATIVE mg/dL
Hgb urine dipstick: NEGATIVE
Ketones, ur: 5 mg/dL — AB
Leukocytes,Ua: NEGATIVE
Nitrite: NEGATIVE
Protein, ur: NEGATIVE mg/dL
Specific Gravity, Urine: 1.005 (ref 1.005–1.030)
pH: 6 (ref 5.0–8.0)

## 2019-11-10 LAB — POC SARS CORONAVIRUS 2 AG -  ED: SARS Coronavirus 2 Ag: NEGATIVE

## 2019-11-10 LAB — ETHANOL: Alcohol, Ethyl (B): 142 mg/dL — ABNORMAL HIGH (ref <10)

## 2019-11-10 MED ORDER — LORAZEPAM 1 MG PO TABS: 0.0000 mg | ORAL_TABLET | Freq: Four times a day (QID) | ORAL | Status: DC

## 2019-11-10 MED ORDER — LACTULOSE 10 GM/15ML PO SOLN
10.0000 g | Freq: Once | ORAL | Status: AC
  Administered 2019-11-10: 10 g via ORAL
  Filled 2019-11-10: qty 30

## 2019-11-10 MED ORDER — ADULT MULTIVITAMIN W/MINERALS CH
1.0000 | ORAL_TABLET | Freq: Every day | ORAL | Status: DC
  Administered 2019-11-11 – 2019-11-12 (×2): 1 via ORAL
  Filled 2019-11-10 (×2): qty 1

## 2019-11-10 MED ORDER — LORAZEPAM 1 MG PO TABS
1.0000 mg | ORAL_TABLET | ORAL | Status: DC | PRN
  Administered 2019-11-11 (×2): 1 mg via ORAL
  Administered 2019-11-11: 2 mg via ORAL
  Administered 2019-11-11 (×2): 1 mg via ORAL
  Administered 2019-11-12: 07:00:00 2 mg via ORAL
  Filled 2019-11-10: qty 2
  Filled 2019-11-10 (×3): qty 1
  Filled 2019-11-10: qty 2
  Filled 2019-11-10: qty 1

## 2019-11-10 MED ORDER — LORAZEPAM 1 MG PO TABS: 0.0000 mg | ORAL_TABLET | Freq: Two times a day (BID) | ORAL | Status: DC

## 2019-11-10 MED ORDER — AMLODIPINE BESYLATE 10 MG PO TABS
10.0000 mg | ORAL_TABLET | Freq: Every day | ORAL | Status: DC
  Administered 2019-11-11 – 2019-11-12 (×2): 10 mg via ORAL
  Filled 2019-11-10 (×2): qty 1

## 2019-11-10 MED ORDER — IPRATROPIUM-ALBUTEROL 0.5-2.5 (3) MG/3ML IN SOLN: 3.0000 mL | Freq: Four times a day (QID) | RESPIRATORY_TRACT | Status: DC

## 2019-11-10 MED ORDER — LORAZEPAM 2 MG/ML IJ SOLN
1.0000 mg | INTRAMUSCULAR | Status: DC | PRN
  Administered 2019-11-11 (×6): 1 mg via INTRAVENOUS
  Filled 2019-11-10 (×5): qty 1

## 2019-11-10 MED ORDER — ENOXAPARIN SODIUM 40 MG/0.4ML ~~LOC~~ SOLN
40.0000 mg | Freq: Every day | SUBCUTANEOUS | Status: DC
  Administered 2019-11-11 (×2): 40 mg via SUBCUTANEOUS
  Filled 2019-11-10: qty 0.4

## 2019-11-10 MED ORDER — LORAZEPAM 2 MG/ML IJ SOLN: 0.0000 mg | Freq: Four times a day (QID) | INTRAMUSCULAR | Status: DC

## 2019-11-10 MED ORDER — ACETAMINOPHEN 650 MG RE SUPP: 650.0000 mg | Freq: Four times a day (QID) | RECTAL | Status: DC | PRN

## 2019-11-10 MED ORDER — HYDRALAZINE HCL 20 MG/ML IJ SOLN
10.0000 mg | INTRAMUSCULAR | Status: DC | PRN
  Administered 2019-11-11 (×2): 10 mg via INTRAVENOUS
  Filled 2019-11-10 (×2): qty 1

## 2019-11-10 MED ORDER — THIAMINE HCL 100 MG/ML IJ SOLN
500.0000 mg | Freq: Three times a day (TID) | INTRAMUSCULAR | Status: DC
  Administered 2019-11-11 – 2019-11-12 (×4): 500 mg via INTRAVENOUS
  Filled 2019-11-10 (×4): qty 6

## 2019-11-10 MED ORDER — LOSARTAN POTASSIUM 50 MG PO TABS
25.0000 mg | ORAL_TABLET | Freq: Every day | ORAL | Status: DC
  Administered 2019-11-11 – 2019-11-12 (×2): 25 mg via ORAL
  Filled 2019-11-10 (×2): qty 1

## 2019-11-10 MED ORDER — SODIUM CHLORIDE 0.9 % IV BOLUS
1000.0000 mL | Freq: Once | INTRAVENOUS | Status: AC
  Administered 2019-11-10: 1000 mL via INTRAVENOUS

## 2019-11-10 MED ORDER — ALBUTEROL SULFATE (2.5 MG/3ML) 0.083% IN NEBU: 2.5000 mg | INHALATION_SOLUTION | RESPIRATORY_TRACT | Status: DC | PRN

## 2019-11-10 MED ORDER — ACETAMINOPHEN 325 MG PO TABS: 650.0000 mg | ORAL_TABLET | Freq: Four times a day (QID) | ORAL | Status: DC | PRN

## 2019-11-10 MED ORDER — LORAZEPAM 2 MG/ML IJ SOLN: 0.0000 mg | Freq: Two times a day (BID) | INTRAMUSCULAR | Status: DC

## 2019-11-10 MED ORDER — LORAZEPAM 2 MG/ML IJ SOLN
1.0000 mg | Freq: Once | INTRAMUSCULAR | Status: AC
  Administered 2019-11-10: 21:00:00 1 mg via INTRAVENOUS
  Filled 2019-11-10: qty 1

## 2019-11-10 MED ORDER — METHYLPREDNISOLONE SODIUM SUCC 125 MG IJ SOLR
125.0000 mg | Freq: Once | INTRAMUSCULAR | Status: AC
  Administered 2019-11-10: 125 mg via INTRAVENOUS
  Filled 2019-11-10: qty 2

## 2019-11-10 MED ORDER — PANTOPRAZOLE SODIUM 20 MG PO TBEC
20.0000 mg | DELAYED_RELEASE_TABLET | Freq: Every day | ORAL | Status: DC
  Administered 2019-11-11 – 2019-11-12 (×2): 20 mg via ORAL
  Filled 2019-11-10 (×2): qty 1

## 2019-11-10 MED ORDER — FOLIC ACID 1 MG PO TABS
1.0000 mg | ORAL_TABLET | Freq: Every day | ORAL | Status: DC
  Administered 2019-11-11 – 2019-11-12 (×2): 1 mg via ORAL
  Filled 2019-11-10 (×2): qty 1

## 2019-11-10 MED ORDER — THIAMINE HCL 100 MG/ML IJ SOLN
500.0000 mg | Freq: Once | INTRAMUSCULAR | Status: AC
  Administered 2019-11-10: 500 mg via INTRAVENOUS
  Filled 2019-11-10: qty 6

## 2019-11-10 NOTE — ED Triage Notes (Signed)
Pt arrive [facility] from home with complaints of SHOB. Pt has hx of asthma. EMS gave 5mg  Albuterol. Pt is an admitted alcoholic. Last drink today. Pt reports he drank a fifth of liquor today. Pt reports tremors for 2 days. Pt has concerns for detox.

## 2019-11-10 NOTE — ED Provider Notes (Signed)
Mark [name]   CSN: [id number] Arrival date & time: 11/10/19  1812     History Chief Complaint  Patient presents with  . Shaking    Mark [name] is a 69 y.o. male with history of asthma, hypertension, carpal tunnel syndrome, BPH, alcohol abuse presents for evaluation of 1 week of persistent generalized weakness.  He reports decreased appetite, decreased oral intake.  He states that he is having difficulty ambulating due to feeling weak and tremulous.  He reports he has been dealing with upper extremity tremulousness for approximately 1 month, is unsure of the cause.  He states that it worsens when he drinks alcohol, improves when he stopped drinking alcohol.  He has never had alcohol withdrawal seizures.  Reportedly the patient's son called for EMS today when he noted that he was having difficulty ambulating outside and looked somewhat short of breath.  EMS noted the patient to be wheezy and administered 5 mg of albuterol with significant improvement in patient's shortness of breath.  He denies any chest pain, abdominal pain, nausea, vomiting, headaches, numbness or tingling of the extremities.  States that he typically drinks a few many bottles of liquor daily.  Today he states he has had 2 beers.  The history is provided by the patient and medical records.       Past Medical History:  Diagnosis Date  . Allergic rhinitis   . Asthma   . BPH (benign prostatic hyperplasia)   . Carpal tunnel syndrome   . Hypertension     There are no problems to display for this patient.   History reviewed. No pertinent surgical history.     No family history on file.  Social History   Tobacco Use  . Smoking status: Current Every Day Smoker    Packs/day: 0.50    Types: Cigarettes  . Smokeless tobacco: Never Used  Substance Use Topics  . Alcohol use: Yes    Comment: 2 beers daily  . Drug use: No    Home Medications Prior to Admission  medications   Medication Sig Start Date End Date Taking? Authorizing Provider  albuterol (PROVENTIL HFA;VENTOLIN HFA) 108 (90 Base) MCG/ACT inhaler Inhale 2 puffs into the lungs every 4 (four) hours as needed for wheezing or shortness of breath. 07/03/16  Yes Horton, Mayer Masker, MD  amLODipine (NORVASC) 10 MG tablet Take 1 tablet (10 mg total) by mouth daily. 11/29/15  Yes Danelle Berry, PA-C  losartan (COZAAR) 25 MG tablet Take 25 mg by mouth daily. 07/06/19  Yes [provider]  meclizine (ANTIVERT) 25 MG tablet Take 1 tablet (25 mg total) by mouth 3 (three) times daily as needed for dizziness. 07/03/16  Yes Horton, Mayer Masker, MD  Multiple Vitamin (MULTIVITAMIN WITH MINERALS) TABS tablet Take 1 tablet by mouth daily.   Yes [provider]  omeprazole (PRILOSEC) 20 MG capsule Take 1 capsule (20 mg total) by mouth daily. 10/28/19  Yes Tegeler, Canary Brim, MD  predniSONE (DELTASONE) 50 MG tablet Take 1 tablet (50 mg total) by mouth daily. 10/29/19  Yes Tegeler, Canary Brim, MD  SYMBICORT 160-4.5 MCG/ACT inhaler Inhale 2 puffs into the lungs daily.  10/14/19  Yes [provider]  traZODone (DESYREL) 100 MG tablet Take 100 mg by mouth at bedtime as needed for sleep.  09/23/19  Yes [provider]  azithromycin (ZITHROMAX Z-PAK) 250 MG tablet Take 500 mg daily x 1 day then 250 mg daily x 4 days Patient not  taking: Reported on 11/10/2019 07/01/16   Drenda Freeze, MD  cetirizine (ZYRTEC ALLERGY) 10 MG tablet Take 1 tablet (10 mg total) by mouth daily. Patient not taking: Reported on 11/10/2019 11/29/15   Delsa Grana, PA-C  hydrochlorothiazide (HYDRODIURIL) 12.5 MG tablet Take 1 tablet (12.5 mg total) by mouth daily. Patient not taking: Reported on 11/10/2019 11/29/15   Delsa Grana, PA-C  predniSONE (DELTASONE) 20 MG tablet Take 2 tablets (40 mg total) by mouth daily. Patient not taking: Reported on 11/10/2019 01/24/19   Davonna Belling, MD    Allergies    Patient has  no known allergies.  Review of Systems   Review of Systems  Constitutional: Positive for fatigue. Negative for chills and fever.  Respiratory: Positive for chest tightness and shortness of breath.   Cardiovascular: Negative for chest pain and leg swelling.  Gastrointestinal: Negative for abdominal pain, nausea and vomiting.  Neurological: Positive for tremors and weakness (Generalized). Negative for numbness.  All other systems reviewed and are negative.   Physical Exam Updated Vital Signs BP (!) 190/99   Pulse (!) 105   Temp 98.8 F (37.1 C) (Oral)   Resp (!) 23   Ht 6\' 2"  (1.88 m)   Wt 77.1 kg   SpO2 94%   BMI 21.83 kg/m   Physical Exam Vitals and nursing [name] reviewed.  Constitutional:      General: He is not in acute distress.    Appearance: He is well-developed.  HENT:     Head: Normocephalic and atraumatic.  Eyes:     General:        Right eye: No discharge.        Left eye: No discharge.     Conjunctiva/sclera: Conjunctivae normal.  Neck:     Vascular: No JVD.     Trachea: No tracheal deviation.  Cardiovascular:     Rate and Rhythm: Regular rhythm. Tachycardia present.     Pulses: Normal pulses.     Heart sounds: Normal heart sounds.     Comments: Mild tachycardia, 2+ radial and DP/PT pulses bilaterally, Homans sign absent bilaterally, no lower extremity edema, no palpable cords, compartments are soft  Pulmonary:     Effort: Pulmonary effort is normal.     Comments: Speaking in full sentences without difficulty, SPO2 saturations 93 to 95% on room air.  Diffuse expiratory wheezes Abdominal:     General: Bowel sounds are normal. There is no distension.     Palpations: Abdomen is soft.     Tenderness: There is no abdominal tenderness. There is no guarding or rebound.  Musculoskeletal:        General: No tenderness. Normal range of motion.     Cervical back: Neck supple.  Skin:    General: Skin is warm and dry.     Findings: No erythema.  Neurological:      Mental Status: He is alert.     Comments: Mental Status:  Alert, Speech fluent without evidence of aphasia.  He is mildly confused, oriented to person and city only. Cranial Nerves:  II:  Peripheral visual fields grossly normal, pupils equal, round, reactive to light III,IV, VI: ptosis not present, extra-ocular motions intact bilaterally  V,VII: smile symmetric, facial light touch sensation equal VIII: hearing grossly normal to voice  X: uvula elevates symmetrically  XI: bilateral shoulder shrug symmetric and strong XII: midline tongue extension without fassiculations Motor:  Tremulousness of the bilateral upper extremities at rest, worse on the right.  No tremulousness of  the lower extremities except with extension at the hips.  Normal tone. 5/5 strength of BUE and BLE major muscle groups including strong and equal grip strength and dorsiflexion/plantar flexion Sensory: light touch normal in all extremities. Cerebellar: Dysmetria with finger-to-nose of the bilateral upper extremities Gait: Ambulatory with somewhat unsteady gait but generally good balance.    Psychiatric:        Behavior: Behavior normal.     ED Results / Procedures / Treatments   Labs (all labs ordered are listed, but only abnormal results are displayed) Labs Reviewed  CBC WITH DIFFERENTIAL/PLATELET - Abnormal; Notable for the following components:      Result Value   RBC 3.91 (*)    MCV 100.8 (*)    MCH 34.8 (*)    All other components within normal limits  COMPREHENSIVE METABOLIC PANEL - Abnormal; Notable for the following components:   AST 95 (*)    ALT 81 (*)    All other components within normal limits  ETHANOL - Abnormal; Notable for the following components:   Alcohol, Ethyl (B) 142 (*)    All other components within normal limits  URINALYSIS, ROUTINE W REFLEX MICROSCOPIC  VITAMIN B1  AMMONIA    EKG None  Radiology CT Head Wo Contrast  Result Date: 11/10/2019 CLINICAL DATA:  Headache EXAM:  CT HEAD WITHOUT CONTRAST TECHNIQUE: Contiguous axial images were obtained from the base of the skull through the vertex without intravenous contrast. COMPARISON:  CT 09/09/2019 FINDINGS: Brain: No acute territorial infarction, hemorrhage or intracranial mass. Mild atrophy. Mild hypodensity in the white matter consistent with chronic small vessel ischemic change. Stable ventricle size Vascular: No hyperdense vessels.  Carotid vascular calcification Skull: Normal. Negative for fracture or focal lesion. Sinuses/Orbits: Mucosal thickening in the ethmoid sinuses Other: None IMPRESSION: 1. No CT evidence for acute intracranial abnormality. 2. Atrophy and mild chronic small vessel ischemic change of the white matter Electronically Signed   By: Jasmine Pang M.D.   On: 11/10/2019 21:12   DG Chest Portable 1 View  Result Date: 11/10/2019 CLINICAL DATA:  Wheezing. EXAM: PORTABLE CHEST 1 VIEW COMPARISON:  10/28/2019 FINDINGS: The heart size and mediastinal contours are within normal limits. Both lungs are clear. The visualized skeletal structures are unremarkable. IMPRESSION: No active disease. Electronically Signed   By: Katherine Mantle M.D.   On: 11/10/2019 19:42    Procedures Procedures (including critical care time)  Medications Ordered in ED Medications  LORazepam (ATIVAN) injection 0-4 mg (has no administration in time range)    Or  LORazepam (ATIVAN) tablet 0-4 mg (has no administration in time range)  LORazepam (ATIVAN) injection 0-4 mg (has no administration in time range)    Or  LORazepam (ATIVAN) tablet 0-4 mg (has no administration in time range)  sodium chloride 0.9 % bolus 1,000 mL (0 mLs Intravenous Stopped 11/10/19 2051)  methylPREDNISolone sodium succinate (SOLU-MEDROL) 125 mg/2 mL injection 125 mg (125 mg Intravenous Given 11/10/19 1922)  thiamine (B-1) injection 500 mg (500 mg Intravenous Given 11/10/19 2051)  LORazepam (ATIVAN) injection 1 mg (1 mg Intravenous Given 11/10/19 2105)    ED  Course  I have reviewed the triage vital signs and the nursing notes.  Pertinent labs & imaging results that were available during my care of the patient were reviewed by me and considered in my medical decision making (see chart for details).    MDM Rules/Calculators/A&P  Patient presenting for evaluation of generalized weakness, tremulousness, shortness of breath.  The tremulousness has been present for approximately 1 month per the patient's report.  The generalized weakness and decreased appetite have been present for about 1 week and he states "I just feel bad".  He is afebrile in the ED, persistently tachycardic and hypertensive.  He appears mildly agitated but nontoxic in appearance.  He has diffuse expiratory wheezes on auscultation of the lungs and has a history of asthma which is poorly controlled.  SPO2 saturations are within normal limits.  He had improvement with albuterol given via EMS.  We will give him IV Solu-Medrol as well.  His chest x-ray shows no acute cardiopulmonary abnormalities.  With regards to his tremulousness he has a constant tremor at rest and with activity involving the bilateral upper extremities and somewhat the lower extremities.  He exhibits dysmetria on finger-to-nose testing bilaterally and some unsteadiness with ambulation.  Lab work reviewed and interpreted by myself shows no leukocytosis, no anemia, no renal insufficiency, no electrolyte abnormalities.  His LFTs are mildly elevated though improved from blood work that was done approximately 2 weeks ago.  His ethanol level today is elevated at 142 however with his longstanding alcohol abuse this could be low enough to cause withdrawals.  His CIWA score is 8.  We will give Ativan for this.  Head CT was obtained which shows no evidence of acute intracranial abnormality.  Considered the possibility of Wernicke's encephalopathy in the setting of ataxia, confusion, tremulousness and alcohol abuse.   We will obtain thiamine level and give 500 mg IV thiamine in the ED.  9:20PM CONSULT: Spoke with Dr. Jerrell Belfast with on-call neurology service.  He recommends obtaining MRI of the brain without contrast to rule out cerebellar stroke given patient's age and risk factors and obtaining an ammonia level.  He also recommends PT/OT eval. He suspects the patient's symptoms are likely secondary to his alcohol abuse. Recommends CIWA protocol.  He does not feel that the patient requires any further neurologic consultation unless the MRI is abnormal.   Care signed out to Dr. Jacqulyn Bath for follow up of MRI and ammonia. Will likely require admission for ataxia and tremulousness.    Final Clinical Impression(s) / ED Diagnoses Final diagnoses:  Tremulousness  Moderate persistent asthma with exacerbation    Rx / DC Orders ED Discharge Orders    None       Bennye Alm 11/10/19 2137    Maia Plan, MD 11/10/19 [phone]

## 2019-11-10 NOTE — Progress Notes (Signed)
Went into pt's room and attempted to do nursing admission history. Pt states he is not able to answer questions. He is sitting at foot of bed and redialing a phone number over and over. Asked the patient's nurse tech to go into room and told her that he is sitting at foot of bed. She went into patient's room to assist him.  Briscoe Burns BSN, RN-BC Admissions RN  11/10/2019 9:53 PM

## 2019-11-10 NOTE — ED Notes (Signed)
Pt stated the he went to the restroom so fast, that he forgot to urinate in the cup.

## 2019-11-10 NOTE — H&P (Signed)
History and Physical    Anthem Frazer GXQ:[id number] DOB: 1950/11/22 DOA: 11/10/2019  PCP: [facility] Patient coming from: Home  Chief Complaint: Shortness breath, tremors  HPI: Mark [name] is a 68 y.o. male with medical history significant of asthma, hypertension, BPH, alcohol use disorder presenting to the ED via EMS for evaluation of shortness of breath and tremors.  Patient states he has asthma and has been feeling short of breath for the past few days.  He has been wheezing and coughing.  Reports compliance with his home asthma inhalers.  Denies fevers, chills, or chest pain.  Reports drinking alcohol daily.  States he had 2 beers prior to his ED arrival.  Reports having tremors on and off for a while.  Denies nausea, vomiting, abdominal pain, or diarrhea.  No additional history to be obtained from the patient.  ED Course: Patient was given 5 mg albuterol by EMS.  In the ED, had wheezing on exam.  Not hypoxic.  Slightly tachycardic.  Blood pressure elevated with systolic in the 180s to 190s.  Labs showing no leukocytosis.  Transaminases slightly elevated, consistent with labs in 10/28/2019.  Blood ethanol level 142.  Ammonia level 42.  UA pending.  SARS-CoV-2 PCR test pending. Chest x-ray showed no active disease. Head CT negative for acute intracranial abnormality. Right upper quadrant ultrasound pending.  Patient was given IV thiamine 500 mg due to concern for Wernicke's encephalopathy in the setting of ataxia, confusion, tremulousness, and alcohol abuse.  Is reported that he had some unsteadiness with ambulation in the ED.  Thiamine level pending.  ED provider discussed the case with Dr. Wilford Corner from neurology who recommended obtaining a brain MRI without contrast to rule out cerebellar stroke and recommended obtaining an ammonia level.  Also recommended PT and OT evaluation.  He felt that the patient's symptoms were likely related to his alcohol abuse.  Did not  recommend any further neurologic consultation unless the MRI is abnormal.  Patient was also given Ativan, IV Solu-Medrol 125 mg, and 1 L normal saline bolus.  Review of Systems:  All systems reviewed and apart from history of presenting illness, are negative.  Past Medical History:  Diagnosis Date  . Allergic rhinitis   . Asthma   . BPH (benign prostatic hyperplasia)   . Carpal tunnel syndrome   . Hypertension     History reviewed. No pertinent surgical history.   reports that he has been smoking cigarettes. He has been smoking about 0.50 packs per day. He has never used smokeless tobacco. He reports current alcohol use. He reports that he does not use drugs.  No Known Allergies  History reviewed. No pertinent family history.  Prior to Admission medications   Medication Sig Start Date End Date Taking? Authorizing Provider  albuterol (PROVENTIL HFA;VENTOLIN HFA) 108 (90 Base) MCG/ACT inhaler Inhale 2 puffs into the lungs every 4 (four) hours as needed for wheezing or shortness of breath. 07/03/16  Yes Horton, Mayer Masker, MD  amLODipine (NORVASC) 10 MG tablet Take 1 tablet (10 mg total) by mouth daily. 11/29/15  Yes Danelle Berry, PA-C  losartan (COZAAR) 25 MG tablet Take 25 mg by mouth daily. 07/06/19  Yes [provider]  meclizine (ANTIVERT) 25 MG tablet Take 1 tablet (25 mg total) by mouth 3 (three) times daily as needed for dizziness. 07/03/16  Yes Horton, Mayer Masker, MD  Multiple Vitamin (MULTIVITAMIN WITH MINERALS) TABS tablet Take 1 tablet by mouth daily.   Yes [provider]  omeprazole (PRILOSEC) 20 MG capsule Take 1 capsule (20 mg total) by mouth daily. 10/28/19  Yes Tegeler, Canary Brim, MD  predniSONE (DELTASONE) 50 MG tablet Take 1 tablet (50 mg total) by mouth daily. 10/29/19  Yes Tegeler, Canary Brim, MD  SYMBICORT 160-4.5 MCG/ACT inhaler Inhale 2 puffs into the lungs daily.  10/14/19  Yes [provider]  traZODone (DESYREL) 100 MG tablet Take  100 mg by mouth at bedtime as needed for sleep.  09/23/19  Yes [provider]  azithromycin (ZITHROMAX Z-PAK) 250 MG tablet Take 500 mg daily x 1 day then 250 mg daily x 4 days Patient not taking: Reported on 11/10/2019 07/01/16   Charlynne Pander, MD  cetirizine (ZYRTEC ALLERGY) 10 MG tablet Take 1 tablet (10 mg total) by mouth daily. Patient not taking: Reported on 11/10/2019 11/29/15   Danelle Berry, PA-C  hydrochlorothiazide (HYDRODIURIL) 12.5 MG tablet Take 1 tablet (12.5 mg total) by mouth daily. Patient not taking: Reported on 11/10/2019 11/29/15   Danelle Berry, PA-C  predniSONE (DELTASONE) 20 MG tablet Take 2 tablets (40 mg total) by mouth daily. Patient not taking: Reported on 11/10/2019 01/24/19   Benjiman Core, MD    Physical Exam: Vitals:   11/10/19 1930 11/10/19 2000 11/10/19 2030 11/10/19 2215  BP: (!) 185/96 (!) 167/90 (!) 190/99 (!) 182/114  Pulse: 92 95 (!) 105 100  Resp: 17 20 (!) 23 20  Temp:      TempSrc:      SpO2: 96% 95% 94% 97%  Weight:      Height:        Physical Exam  Constitutional: He is oriented to person, place, and time.  HENT:  Head: Normocephalic.  Eyes: EOM are normal. Right eye exhibits no discharge. Left eye exhibits no discharge.  Cardiovascular: Normal rate, regular rhythm and intact distal pulses.  Pulmonary/Chest: Effort normal. He has wheezes. He has no rales.  Diffuse end expiratory wheezing  Abdominal: Soft. Bowel sounds are normal. He exhibits distension. There is no abdominal tenderness. There is no guarding.  Musculoskeletal:        General: No edema.     Cervical back: Neck supple.  Neurological: He is alert and oriented to person, place, and time.  AAOx3 Speech fluent, no obvious facial droop Strength 5 out of 5 in bilateral upper and lower extremities Bilateral upper extremity tremors present both at rest and with activity Dysmetria on finger-to-nose test bilaterally  Skin: Skin is warm and dry. He is not diaphoretic.     Labs on Admission: I have personally reviewed following labs and imaging studies  CBC: Recent Labs  Lab 11/10/19 1858  WBC 6.1  NEUTROABS 4.0  HGB 13.6  HCT 39.4  MCV 100.8*  PLT 164   Basic Metabolic Panel: Recent Labs  Lab 11/10/19 1858  NA 139  K 3.6  CL 102  CO2 24  GLUCOSE 72  BUN 9  CREATININE 0.91  CALCIUM 8.9   GFR: Estimated Creatinine Clearance: 87.1 mL/min (by C-G formula based on SCr of 0.91 mg/dL). Liver Function Tests: Recent Labs  Lab 11/10/19 1858  AST 95*  ALT 81*  ALKPHOS 84  BILITOT 0.8  PROT 7.5  ALBUMIN 4.0   No results for input(s): LIPASE, AMYLASE in the last 168 hours. Recent Labs  Lab 11/10/19 2119  AMMONIA 42*   Coagulation Profile: No results for input(s): INR, PROTIME in the last 168 hours. Cardiac Enzymes: No results for input(s): CKTOTAL, CKMB,  CKMBINDEX, TROPONINI in the last 168 hours. BNP (last 3 results) No results for input(s): PROBNP in the last 8760 hours. HbA1C: No results for input(s): HGBA1C in the last 72 hours. CBG: No results for input(s): GLUCAP in the last 168 hours. Lipid Profile: No results for input(s): CHOL, HDL, LDLCALC, TRIG, CHOLHDL, LDLDIRECT in the last 72 hours. Thyroid Function Tests: No results for input(s): TSH, T4TOTAL, FREET4, T3FREE, THYROIDAB in the last 72 hours. Anemia Panel: No results for input(s): VITAMINB12, FOLATE, FERRITIN, TIBC, IRON, RETICCTPCT in the last 72 hours. Urine analysis:    Component Value Date/Time   COLORURINE STRAW (A) 11/10/2019 2153   APPEARANCEUR CLEAR 11/10/2019 2153   LABSPEC 1.005 11/10/2019 2153   PHURINE 6.0 11/10/2019 2153   GLUCOSEU NEGATIVE 11/10/2019 2153   HGBUR NEGATIVE 11/10/2019 2153   BILIRUBINUR NEGATIVE 11/10/2019 2153   KETONESUR 5 (A) 11/10/2019 2153   PROTEINUR NEGATIVE 11/10/2019 2153   NITRITE NEGATIVE 11/10/2019 2153   LEUKOCYTESUR NEGATIVE 11/10/2019 2153    Radiological Exams on Admission: CT Head Wo Contrast  Result  Date: 11/10/2019 CLINICAL DATA:  Headache EXAM: CT HEAD WITHOUT CONTRAST TECHNIQUE: Contiguous axial images were obtained from the base of the skull through the vertex without intravenous contrast. COMPARISON:  CT 09/09/2019 FINDINGS: Brain: No acute territorial infarction, hemorrhage or intracranial mass. Mild atrophy. Mild hypodensity in the white matter consistent with chronic small vessel ischemic change. Stable ventricle size Vascular: No hyperdense vessels.  Carotid vascular calcification Skull: Normal. Negative for fracture or focal lesion. Sinuses/Orbits: Mucosal thickening in the ethmoid sinuses Other: None IMPRESSION: 1. No CT evidence for acute intracranial abnormality. 2. Atrophy and mild chronic small vessel ischemic change of the white matter Electronically Signed   By: Donavan Foil M.D.   On: 11/10/2019 21:12   DG Chest Portable 1 View  Result Date: 11/10/2019 CLINICAL DATA:  Wheezing. EXAM: PORTABLE CHEST 1 VIEW COMPARISON:  10/28/2019 FINDINGS: The heart size and mediastinal contours are within normal limits. Both lungs are clear. The visualized skeletal structures are unremarkable. IMPRESSION: No active disease. Electronically Signed   By: Constance Holster M.D.   On: 11/10/2019 19:42   US Abdomen Limited RUQ  Result Date: 11/10/2019 CLINICAL DATA:  69 year old male with elevated LFTs. EXAM: ULTRASOUND ABDOMEN LIMITED RIGHT UPPER QUADRANT COMPARISON:  None. FINDINGS: Gallbladder: No gallstones or wall thickening visualized. No sonographic Murphy sign noted by sonographer. Common bile duct: Diameter: 3 mm Liver: There is advanced diffuse increased liver echogenicity most commonly seen in the setting of fatty infiltration. Superimposed inflammation or fibrosis is not excluded. Clinical correlation is recommended. Portal vein is patent on color Doppler imaging with normal direction of blood flow towards the liver. Other: None. IMPRESSION: Fatty liver, otherwise unremarkable right upper quadrant  ultrasound. Electronically Signed   By: Anner Crete M.D.   On: 11/10/2019 23:15    EKG: Independently reviewed.  Sinus tachycardia, heart rate 101.  No acute ischemic changes.  Assessment/Plan Principal Problem:   Asthma exacerbation Active Problems:   Tremor   Alcohol abuse with intoxication ([place])   Hypertensive urgency   Transaminitis   Acute asthma exacerbation: Wheezing on exam.  Not hypoxic. -Continue Solu-Medrol 60 mg every 6 hours, DuoNebs every 6 hours, albuterol nebulizer as needed, and give IV magnesium 2 g.  Continuous pulse ox, supplemental oxygen if needed to keep oxygen saturation above 92%.  Concern for possible Wernicke's encephalopathy: Given ataxia, tremulousness, and alcohol abuse.  Patient was given IV thiamine 500 mg in the  ED. -Continue IV thiamine at this time.  Thiamine level pending.  Tremors: Noted to have constant tremor at rest and with activity involving bilateral upper extremities.  He exhibited dysmetria on finger-to-nose testing bilaterally.  Neurology recommended brain MRI to rule out cerebellar stroke.  Ammonia level mildly elevated. -Stat brain MRI pending.  A dose of lactulose given, repeat ammonia level in a.m. PT and OT evaluation.  Neurology did not recommend any further neurologic consultation unless MRI is abnormal.  Alcohol abuse with acute intoxication: Blood ethanol level elevated.  He drinks heavily on a regular basis and is at high risk for withdrawal during this hospitalization. -CIWA protocol; Ativan as needed.  Thiamine, folate, and multivitamin.  Hypertensive urgency: Blood pressure elevated with systolic in the 180s to 190s. -Continue home amlodipine and losartan.  IV hydralazine as needed.  Monitor blood pressure closely.  Mild transaminitis: Likely related to alcohol use. Right upper quadrant ultrasound showing fatty liver and no other acute findings.   DVT prophylaxis: Lovenox Code Status: Full code Family Communication: No  family available at this time. Disposition Plan: Status is: Inpatient  Remains inpatient appropriate because:Persistent severe electrolyte disturbances, Altered mental status and IV treatments appropriate due to intensity of illness or inability to take PO   Dispo: The patient is from: Home              Anticipated d/c is to: Home              Anticipated d/c date is: 3 days              Patient currently is not medically stable to d/c.  The medical decision making on this patient was of high complexity and the patient is at high risk for clinical deterioration, therefore this is a level 3 visit.  John Giovanni MD [facility]  If 7PM-7AM, please contact night-coverage [email or web address]  11/11/2019, 12:03 AM

## 2019-11-11 ENCOUNTER — Encounter (HOSPITAL_COMMUNITY): Payer: Self-pay | Admitting: Internal Medicine

## 2019-11-11 LAB — RESPIRATORY PANEL BY RT PCR (FLU A&B, COVID)
Influenza A by PCR: NEGATIVE
Influenza B by PCR: NEGATIVE
SARS Coronavirus 2 by RT PCR: NEGATIVE

## 2019-11-11 LAB — AMMONIA: Ammonia: 43 umol/L — ABNORMAL HIGH (ref 9–35)

## 2019-11-11 LAB — HIV ANTIBODY (ROUTINE TESTING W REFLEX): HIV Screen 4th Generation wRfx: NONREACTIVE

## 2019-11-11 LAB — PHOSPHORUS: Phosphorus: 3 mg/dL (ref 2.5–4.6)

## 2019-11-11 LAB — MRSA PCR SCREENING: MRSA by PCR: NEGATIVE

## 2019-11-11 LAB — MAGNESIUM: Magnesium: 2.5 mg/dL — ABNORMAL HIGH (ref 1.7–2.4)

## 2019-11-11 MED ORDER — METHYLPREDNISOLONE SODIUM SUCC 125 MG IJ SOLR
60.0000 mg | Freq: Two times a day (BID) | INTRAMUSCULAR | Status: DC
  Administered 2019-11-11: 21:00:00 60 mg via INTRAVENOUS
  Filled 2019-11-11: qty 2

## 2019-11-11 MED ORDER — METOPROLOL TARTRATE 12.5 MG HALF TABLET
12.5000 mg | ORAL_TABLET | Freq: Two times a day (BID) | ORAL | Status: DC
  Administered 2019-11-11 – 2019-11-12 (×2): 12.5 mg via ORAL
  Filled 2019-11-11 (×2): qty 1

## 2019-11-11 MED ORDER — METHYLPREDNISOLONE SODIUM SUCC 125 MG IJ SOLR
60.0000 mg | Freq: Four times a day (QID) | INTRAMUSCULAR | Status: DC
  Administered 2019-11-11 (×2): 60 mg via INTRAVENOUS
  Filled 2019-11-11: qty 2

## 2019-11-11 MED ORDER — HYDROCHLOROTHIAZIDE 25 MG PO TABS
12.5000 mg | ORAL_TABLET | Freq: Every day | ORAL | Status: DC
  Administered 2019-11-11 – 2019-11-12 (×2): 12.5 mg via ORAL
  Filled 2019-11-11 (×2): qty 1

## 2019-11-11 MED ORDER — CHLORHEXIDINE GLUCONATE CLOTH 2 % EX PADS
6.0000 | MEDICATED_PAD | Freq: Every day | CUTANEOUS | Status: DC
  Administered 2019-11-11 – 2019-11-12 (×2): 6 via TOPICAL

## 2019-11-11 MED ORDER — IPRATROPIUM-ALBUTEROL 0.5-2.5 (3) MG/3ML IN SOLN: 3.0000 mL | RESPIRATORY_TRACT | Status: DC | PRN

## 2019-11-11 MED ORDER — ALBUTEROL SULFATE (2.5 MG/3ML) 0.083% IN NEBU
INHALATION_SOLUTION | RESPIRATORY_TRACT | Status: AC
  Administered 2019-11-11: 2.5 mg via RESPIRATORY_TRACT
  Filled 2019-11-11: qty 3

## 2019-11-11 MED ORDER — HYDRALAZINE HCL 20 MG/ML IJ SOLN
20.0000 mg | INTRAMUSCULAR | Status: DC | PRN
  Administered 2019-11-11: 20 mg via INTRAVENOUS
  Filled 2019-11-11: qty 1

## 2019-11-11 MED ORDER — MAGNESIUM SULFATE 2 GM/50ML IV SOLN
2.0000 g | Freq: Once | INTRAVENOUS | Status: AC
  Administered 2019-11-11: 2 g via INTRAVENOUS

## 2019-11-11 NOTE — Evaluation (Signed)
Physical Therapy Evaluation Patient Details Name: Mark [name] MRN: [id number] DOB: 1950/10/30 Today's Date: 11/11/2019   History of Present Illness  Mark [name] is a 69 y.o. male with medical history significant of asthma, hypertension, BPH, alcohol use disorder presenting to the ED via EMS for evaluation of shortness of breath and tremors  Clinical Impression  The patient presents with extremity tremors and trunk tremors when standing. HR from 115 to 138, BP 205/126. RN notified of VS. Patient  Should progress to ambulatory status but may require more assistance at DC. Unsure if family is available 24.7. Pt admitted with above diagnosis. . Pt currently with functional limitations due to the deficits listed below (see PT Problem List). Pt will benefit from skilled PT to increase their independence and safety with mobility to allow discharge to the venue listed below.        Follow Up Recommendations SNF;Supervision/Assistance - 24 hour    Equipment Recommendations  None recommended by PT    Recommendations for Other Services       Precautions / Restrictions Precautions Precautions: Fall      Mobility  Bed Mobility Overal bed mobility: Needs Assistance Bed Mobility: Supine to Sit;Sit to Supine     Supine to sit: Supervision Sit to supine: Supervision   General bed mobility comments: close guarding  Transfers Overall transfer level: Needs assistance Equipment used: Rolling walker (2 wheeled) Transfers: Sit to/from Stand Sit to Stand: Min assist         General transfer comment: steady assist to rise to stand, noted trunk titubatiuons  Ambulation/Gait Ambulation/Gait assistance: Min assist   Assistive device: Rolling walker (2 wheeled) Gait Pattern/deviations: Step-to pattern     General Gait Details: side steps x 3 along bed.  Stairs            Wheelchair Mobility    Modified Rankin (Stroke Patients Only)       Balance Overall balance  assessment: Needs assistance Sitting-balance support: Feet supported;Bilateral upper extremity supported Sitting balance-Leahy Scale: Fair     Standing balance support: Bilateral upper extremity supported;During functional activity Standing balance-Leahy Scale: Poor                               Pertinent Vitals/Pain Pain Assessment: No/denies pain    Home Living Family/patient expects to be discharged to:: Private residence Living Arrangements: Children Available Help at Discharge: Family Type of Home: House Home Access: Stairs to enter Entrance Stairs-Rails: Right Entrance Stairs-Number of Steps: 2 Home Layout: One level Home Equipment: None      Prior Function Level of Independence: Independent               Hand Dominance        Extremity/Trunk Assessment        Lower Extremity Assessment Lower Extremity Assessment: LLE deficits/detail;RLE deficits/detail(genaeral shakiness when standing)       Communication   Communication: No difficulties  Cognition Arousal/Alertness: Awake/alert Behavior During Therapy: WFL for tasks assessed/performed;Flat affect Overall Cognitive Status: No family/caregiver present to determine baseline cognitive functioning                                 General Comments: oriented to hospital and here due to asthma, states his tremors are improved      General Comments      Exercises     Assessment/Plan  PT Assessment Patient needs continued PT services  PT Problem List Decreased strength;Decreased balance;Decreased cognition;Decreased knowledge of precautions;Decreased mobility;Decreased knowledge of use of DME;Cardiopulmonary status limiting activity;Decreased activity tolerance;Decreased safety awareness;Decreased coordination       PT Treatment Interventions DME instruction;Functional mobility training;Balance training;Patient/family education;Gait training;Therapeutic  activities;Therapeutic exercise;Cognitive remediation    PT Goals (Current goals can be found in the Care Plan section)  Acute Rehab PT Goals Patient Stated Goal: I want to get out of here PT Goal Formulation: With patient Time For Goal Achievement: 11/25/19 Potential to Achieve Goals: Fair    Frequency Min 2X/week   Barriers to discharge Decreased caregiver support      Co-evaluation PT/OT/SLP Co-Evaluation/Treatment: Yes Reason for Co-Treatment: To address functional/ADL transfers;For patient/therapist safety PT goals addressed during session: Mobility/safety with mobility         AM-PAC PT "6 Clicks" Mobility  Outcome Measure Help needed turning from your back to your side while in a flat bed without using bedrails?: A Little Help needed moving from lying on your back to sitting on the side of a flat bed without using bedrails?: A Little Help needed moving to and from a bed to a chair (including a wheelchair)?: A Little Help needed standing up from a chair using your arms (e.g., wheelchair or bedside chair)?: A Lot Help needed to walk in hospital room?: A Lot Help needed climbing 3-5 steps with a railing? : A Lot 6 Click Score: 15    End of Session Equipment Utilized During Treatment: Gait belt Activity Tolerance: Treatment limited secondary to medical complications (Comment) Patient left: in bed;with call bell/phone within reach;with bed alarm set Nurse Communication: Mobility status(high BP and HR) PT Visit Diagnosis: Difficulty in walking, not elsewhere classified (R26.2);Unsteadiness on feet (R26.81)    Time: 2876-8115 PT Time Calculation (min) (ACUTE ONLY): 24 min   Charges:   PT Evaluation $PT Eval Moderate Complexity: 1 Mod         11/11/2019, 10:17 AM Blanchard Kelch PT Acute Rehabilitation Services Pager [phone] Office [phone]

## 2019-11-11 NOTE — TOC Progression Note (Signed)
Transition of Care [facility]) - Progression Note    Patient Details  Name: Mark [name] MRN: [id number] Date of Birth: June 27, 1951  Transition of Care [facility] CM/SW Contact  Golda Acre, RN Phone Number: 11/11/2019, 1:00 PM  Clinical Narrative:    [id number]/asthmas/from home has pcp should be able to return home/Iv solu-medrol and nebs        Expected Discharge Plan and Services                                                 Social Determinants of Health (SDOH) Interventions    Readmission Risk Interventions No flowsheet data found.

## 2019-11-11 NOTE — Progress Notes (Signed)
Tried to contact pt's son [name] [place]) - unsuccessful, VM left.  Mobile number in chart is wrong (unable to update it properly)  Cell phone # - [phone]

## 2019-11-11 NOTE — Evaluation (Signed)
Occupational Therapy Evaluation Patient Details Name: Mark [name] MRN: [id number] DOB: [birth year] Today's Date: 11/11/2019    History of Present Illness Mark [name] is a 69 y.o. male with medical history significant of asthma, hypertension, BPH, alcohol use disorder presenting to the ED via EMS for evaluation of shortness of breath and tremors   Clinical Impression   Patient with functional deficits listed below impacting independence with self care. Patient is tremulous throughout session requiring min A to steady urinal in sitting and min A x2 for safety with functional transfer and side stepping to head of bed with walker. Once returned to sitting EOB BP taken 205/126 and HR up to 140s therefore limited session and patient returned to bed. Will continue to follow.    Follow Up Recommendations  SNF    Equipment Recommendations  Other (comment)(defer to next venue)       Precautions / Restrictions Precautions Precautions: Fall Restrictions Weight Bearing Restrictions: No      Mobility Bed Mobility Overal bed mobility: Needs Assistance Bed Mobility: Supine to Sit;Sit to Supine     Supine to sit: Supervision Sit to supine: Supervision   General bed mobility comments: close guarding  Transfers Overall transfer level: Needs assistance Equipment used: Rolling walker (2 wheeled) Transfers: Sit to/from Stand Sit to Stand: Min assist;+2 safety/equipment         General transfer comment: steady assist to rise to stand, noted trunk titubatiuons    Balance Overall balance assessment: Needs assistance Sitting-balance support: Feet supported;Bilateral upper extremity supported Sitting balance-Leahy Scale: Fair     Standing balance support: Bilateral upper extremity supported;During functional activity Standing balance-Leahy Scale: Poor                             ADL either performed or assessed with clinical judgement   ADL Overall ADL's : Needs  assistance/impaired     Grooming: Min guard;Sitting Grooming Details (indicate cue type and reason): due to tremors Upper Body Bathing: Min guard;Sitting   Lower Body Bathing: Moderate assistance;Sitting/lateral leans;Sit to/from stand   Upper Body Dressing : Min guard;Sitting   Lower Body Dressing: Moderate assistance;Maximal assistance;Sitting/lateral leans;Sit to/from stand Lower Body Dressing Details (indicate cue type and reason): prompted patient to pull up sock, refused to try and reach down Toilet Transfer: Minimal assistance;+2 for safety/equipment;RW;BSC;Ambulation Toilet Transfer Details (indicate cue type and reason): simulated with functional mobility, pt able to side step to head of bed, pt tremulous with mobility  Toileting- Clothing Manipulation and Hygiene: Minimal assistance;Sit to/from stand;Sitting/lateral lean Toileting - Clothing Manipulation Details (indicate cue type and reason): min A for steadying urinal while sitting to urinate due to tremors     Functional mobility during ADLs: Minimal assistance;+2 for safety/equipment;Cueing for safety;Cueing for sequencing;Rolling walker General ADL Comments: patient requires increased assistance with self care due to decreased coordination, activity tolerance, balance, safety awareness                  Pertinent Vitals/Pain Pain Assessment: No/denies pain     Hand Dominance Right   Extremity/Trunk Assessment Upper Extremity Assessment Upper Extremity Assessment: Generalized weakness   Lower Extremity Assessment Lower Extremity Assessment: Defer to PT evaluation       Communication Communication Communication: No difficulties   Cognition Arousal/Alertness: Awake/alert Behavior During Therapy: WFL for tasks assessed/performed;Flat affect Overall Cognitive Status: No family/caregiver present to determine baseline cognitive functioning  General Comments:  oriented to hospital and here due to asthma, states his tremors are improved   General Comments  pt BP up to 205/126 with minimal activity with HR in 140s therefore limited session            Home Living Family/patient expects to be discharged to:: Private residence Living Arrangements: Children Available Help at Discharge: Family Type of Home: House Home Access: Stairs to enter Secretary/administrator of Steps: 2 Entrance Stairs-Rails: Right Home Layout: One level     Bathroom Shower/Tub: Tub/shower unit         Home Equipment: None          Prior Functioning/Environment Level of Independence: Independent                 OT Problem List: Decreased activity tolerance;Impaired balance (sitting and/or standing);Decreased coordination;Decreased safety awareness;Decreased knowledge of use of DME or AE      OT Treatment/Interventions: Self-care/ADL training;Therapeutic exercise;DME and/or AE instruction;Therapeutic activities;Cognitive remediation/compensation;Patient/family education;Balance training    OT Goals(Current goals can be found in the care plan section) Acute Rehab OT Goals Patient Stated Goal: I want to get out of here OT Goal Formulation: With patient Time For Goal Achievement: 11/25/19 Potential to Achieve Goals: Good  OT Frequency: Min 2X/week           Co-evaluation PT/OT/SLP Co-Evaluation/Treatment: Yes Reason for Co-Treatment: Complexity of the patient's impairments (multi-system involvement);For patient/therapist safety;To address functional/ADL transfers;Necessary to address cognition/behavior during functional activity PT goals addressed during session: Mobility/safety with mobility OT goals addressed during session: ADL's and self-care      AM-PAC OT "6 Clicks" Daily Activity     Outcome Measure Help from another person eating meals?: A Little Help from another person taking care of personal grooming?: A Little Help from another person  toileting, which includes using toliet, bedpan, or urinal?: A Lot Help from another person bathing (including washing, rinsing, drying)?: A Lot Help from another person to put on and taking off regular upper body clothing?: A Little Help from another person to put on and taking off regular lower body clothing?: A Lot 6 Click Score: 15   End of Session Equipment Utilized During Treatment: Rolling walker;Gait belt  Activity Tolerance: Patient limited by fatigue;Treatment limited secondary to medical complications (Comment);Other (comment)(elevated BP and HR) Patient left: in bed;with call bell/phone within reach;with bed alarm set  OT Visit Diagnosis: Unsteadiness on feet (R26.81);Other abnormalities of gait and mobility (R26.89);Other symptoms and signs involving cognitive function                Time: 8416-6063 OT Time Calculation (min): 20 min Charges:  OT General Charges $OT Visit: 1 Visit OT Evaluation $OT Eval Moderate Complexity: 1 Mod  Marlyce Huge OT Pager: [phone]  Carmelia Roller 11/11/2019, 1:11 PM

## 2019-11-11 NOTE — Progress Notes (Addendum)
PROGRESS NOTE    Mark [name]  VOJ:[id number] DOB: 1951-06-06 DOA: 11/10/2019 PCP: [facility]    Chief Complaint  Patient presents with  . Shaking    Brief Narrative:  66 male, multiple ED visits since 09/2019-known EtOH habituation-laceration 09/09/2019 repaired sent home-read presented 10/28/2019 wheeze, S OB, rectal bleed-it was felt at that time by EDP asthma exacerbation-apparently drinks 5-6 bottles of liquor daily-he felt better and wanted to go home-he declined a rectal exam in the ED was given Prilosec steroids on discharge-hemoglobin was normal.  Return to ED 11/10/2019-S OB-wheeze-main complaint shortness of breath, tremors Rx albuterol X1 + CXR negative, CT head negative, RUQ ultrasound = fatty liver MRI brain-C3-C4 moderate to severe spinal stenosis generalized age-related cerebral atrophy + microvascular disease   Assessment & Plan:   Principal Problem:   Asthma exacerbation Active Problems:   Tremor   Alcohol abuse with intoxication (HCC)   Hypertensive urgency   Transaminitis  Acute asthma exacerbation Less wheezing per patient-seems stable at this time-change Solu-Medrol to from every 6 to every 12 We will continue inhalers at this time  12 EtOH,?  Wernicke's ? tremors Etiology of tremors unclear I do not think he has Wernicke's-I think that beta agonist is probably causing some of his tremors and we will need to have this monitored in the outpatient setting  HTN urgency tachycardia likely related to withdrawal from alcohol Continue hydrochlorothiazide 12.5 losartan 25 amlodipine 10 and increased hydralazine to 20 every 4 as needed for blood pressure above 170  Alcoholic hepatitis Mild AST/ALT 95/81 ammonia is slightly elevated however he is not confused-hold lactulose unless confusion occurs   DVT prophylaxis: Lovenox Code Status: Full Family Communication: son Thayer Ohm) - Cell phone # - [phone] updated completely  Disposition:   Status is: Inpatient  Remains inpatient appropriate because:Persistent severe electrolyte disturbances, Ongoing diagnostic testing needed not appropriate for outpatient work up and Unsafe d/c plan   Dispo: The patient is from: Home              Anticipated d/c is to: Home              Anticipated d/c date is: 2 days              Patient currently is medically stable to d/c.        Consultants:   none  Procedures: none  Antimicrobials: none    Subjective: Awake alert shakey in no distress  Passed some urine No stool  Objective: Vitals:   11/11/19 0400 11/11/19 0500 11/11/19 0537 11/11/19 0605  BP: (!) 169/110 (!) 196/125 (!) 196/125 (!) 163/90  Pulse: 98 98  (!) 115  Resp: 19 (!) 22  (!) 25  Temp: 98.1 F (36.7 C)     TempSrc: Oral     SpO2: 94% 93%  95%  Weight:      Height:        Intake/Output Summary (Last 24 hours) at 11/11/2019 9381 Last data filed at 11/11/2019 0600 Gross per 24 hour  Intake 1000 ml  Output 200 ml  Net 800 ml   Filed Weights   11/10/19 1831  Weight: 77.1 kg    Examination:  General exam: eomi no icterus Respiratory system: wheeze no fremitus Cardiovascular system: s1 s2 no m, sinus tach Gastrointestinal system: sof tnt nd no rebound. Central nervous system: intact reflexes and power Extremities: No lower extremity edema no swelling of joints Psychiatry: Euthymic pleasant  Data Reviewed: I have personally reviewed following labs and imaging studies BUN/creatinine 9/0.9 AST/ALT 95/81 Ammonia 43 Magnesium 2.5-phosphorus 3.0 WBC 6.1, hemoglobin 13.6 platelet 164 Imaging as above   Radiology Studies: CT Head Wo Contrast  Result Date: 11/10/2019 CLINICAL DATA:  Headache EXAM: CT HEAD WITHOUT CONTRAST TECHNIQUE: Contiguous axial images were obtained from the base of the skull through the vertex without intravenous contrast. COMPARISON:  CT 09/09/2019 FINDINGS: Brain: No acute territorial infarction,  hemorrhage or intracranial mass. Mild atrophy. Mild hypodensity in the white matter consistent with chronic small vessel ischemic change. Stable ventricle size Vascular: No hyperdense vessels.  Carotid vascular calcification Skull: Normal. Negative for fracture or focal lesion. Sinuses/Orbits: Mucosal thickening in the ethmoid sinuses Other: None IMPRESSION: 1. No CT evidence for acute intracranial abnormality. 2. Atrophy and mild chronic small vessel ischemic change of the white matter Electronically Signed   By: Donavan Foil M.D.   On: 11/10/2019 21:12   MR Brain Wo Contrast (neuro protocol)  Result Date: 11/11/2019 CLINICAL DATA:  Initial evaluation for acute ataxia. EXAM: MRI HEAD WITHOUT CONTRAST TECHNIQUE: Multiplanar, multiecho pulse sequences of the brain and surrounding structures were obtained without intravenous contrast. COMPARISON:  Prior head CT from earlier same day. FINDINGS: Brain: Examination moderately degraded by motion artifact. Generalized age-related cerebral atrophy. Patchy T2/FLAIR hyperintensity within the periventricular and deep white matter as well as the pons, most consistent with chronic small vessel ischemic disease, mild in nature. No abnormal foci of restricted diffusion to suggest acute or subacute ischemia. Gray-white matter differentiation maintained. No encephalomalacia to suggest chronic cortical infarction. No evidence for acute or chronic intracranial hemorrhage. No mass lesion, midline shift or mass effect. No hydrocephalus or extra-axial fluid collection. Pituitary gland suprasellar region normal. Midline structures intact. Vascular: Major intracranial vascular flow voids are maintained. Skull and upper cervical spine: Craniocervical junction normal. Degenerative spondylosis noted at C3-4 with resultant moderate to severe spinal stenosis (series 7, image 12). Bone marrow signal intensity within normal limits. No scalp soft tissue abnormality. Sinuses/Orbits: Patient  status post ocular lens replacement on the left. Globes and orbital soft tissues demonstrate no acute finding. Paranasal sinuses are clear. No mastoid effusion. Inner ear structures grossly normal. Other: None. IMPRESSION: 1. No acute intracranial abnormality. 2. Generalized age-related cerebral atrophy with mild chronic small vessel ischemic disease. 3. Degenerative spondylosis at C3-4 with resultant moderate to severe spinal stenosis. Finding could be further assessed with dedicated MRI of the cervical spine as clinically warranted. Electronically Signed   By: Jeannine Boga M.D.   On: 11/11/2019 00:07   DG Chest Portable 1 View  Result Date: 11/10/2019 CLINICAL DATA:  Wheezing. EXAM: PORTABLE CHEST 1 VIEW COMPARISON:  10/28/2019 FINDINGS: The heart size and mediastinal contours are within normal limits. Both lungs are clear. The visualized skeletal structures are unremarkable. IMPRESSION: No active disease. Electronically Signed   By: Constance Holster M.D.   On: 11/10/2019 19:42   US Abdomen Limited RUQ  Result Date: 11/10/2019 CLINICAL DATA:  69 year old male with elevated LFTs. EXAM: ULTRASOUND ABDOMEN LIMITED RIGHT UPPER QUADRANT COMPARISON:  None. FINDINGS: Gallbladder: No gallstones or wall thickening visualized. No sonographic Murphy sign noted by sonographer. Common bile duct: Diameter: 3 mm Liver: There is advanced diffuse increased liver echogenicity most commonly seen in the setting of fatty infiltration. Superimposed inflammation or fibrosis is not excluded. Clinical correlation is recommended. Portal vein is patent on color Doppler imaging with normal direction of blood flow towards the liver. Other: None. IMPRESSION: Fatty  liver, otherwise unremarkable right upper quadrant ultrasound. Electronically Signed   By: Elgie Collard M.D.   On: 11/10/2019 23:15      Scheduled Meds: . amLODipine  10 mg Oral Daily  . Chlorhexidine Gluconate Cloth  6 each Topical Daily  . enoxaparin  (LOVENOX) injection  40 mg Subcutaneous QHS  . folic acid  1 mg Oral Daily  . losartan  25 mg Oral Daily  . methylPREDNISolone (SOLU-MEDROL) injection  60 mg Intravenous Q6H  . multivitamin with minerals  1 tablet Oral Daily  . pantoprazole  20 mg Oral Daily  . thiamine  500 mg Intravenous TID   Continuous Infusions:   LOS: 1 day    Time spent: 25    Rhetta Mura, MD [facility]   To contact the attending provider between 7A-7P or the covering provider during after hours 7P-7A, please log into the web site [email or web address] and access using universal [place] password for that web site. If you do not have the password, please call the hospital operator.  11/11/2019, 7:06 AM

## 2019-11-12 LAB — COMPREHENSIVE METABOLIC PANEL
ALT: 72 U/L — ABNORMAL HIGH (ref 0–44)
AST: 65 U/L — ABNORMAL HIGH (ref 15–41)
Albumin: 3.8 g/dL (ref 3.5–5.0)
Alkaline Phosphatase: 83 U/L (ref 38–126)
Anion gap: 13 (ref 5–15)
BUN: 12 mg/dL (ref 8–23)
CO2: 26 mmol/L (ref 22–32)
Calcium: 9.4 mg/dL (ref 8.9–10.3)
Chloride: 97 mmol/L — ABNORMAL LOW (ref 98–111)
Creatinine, Ser: 1.04 mg/dL (ref 0.61–1.24)
GFR calc Af Amer: >60 mL/min (ref >60)
GFR calc non Af Amer: >60 mL/min (ref >60)
Glucose, Bld: 161 mg/dL — ABNORMAL HIGH (ref 70–99)
Potassium: 3.7 mmol/L (ref 3.5–5.1)
Sodium: 136 mmol/L (ref 135–145)
Total Bilirubin: 1.3 mg/dL — ABNORMAL HIGH (ref 0.3–1.2)
Total Protein: 7.6 g/dL (ref 6.5–8.1)

## 2019-11-12 LAB — CBC
HCT: 44.5 % (ref 39.0–52.0)
Hemoglobin: 15.6 g/dL (ref 13.0–17.0)
MCH: 34.7 pg — ABNORMAL HIGH (ref 26.0–34.0)
MCHC: 35.1 g/dL (ref 30.0–36.0)
MCV: 98.9 fL (ref 80.0–100.0)
Platelets: 195 10*3/uL (ref 150–400)
RBC: 4.5 MIL/uL (ref 4.22–5.81)
RDW: 11.9 % (ref 11.5–15.5)
WBC: 11 10*3/uL — ABNORMAL HIGH (ref 4.0–10.5)
nRBC: 0 % (ref 0.0–0.2)

## 2019-11-12 LAB — MAGNESIUM: Magnesium: 2.4 mg/dL (ref 1.7–2.4)

## 2019-11-12 MED ORDER — TRAZODONE HCL 100 MG PO TABS: 100.0000 mg | ORAL_TABLET | Freq: Every evening | ORAL | 1 refills | Status: DC | PRN

## 2019-11-12 MED ORDER — PREDNISONE 20 MG PO TABS
60.0000 mg | ORAL_TABLET | Freq: Every day | ORAL | Status: DC
  Administered 2019-11-12: 60 mg via ORAL
  Filled 2019-11-12: qty 3

## 2019-11-12 MED ORDER — PREDNISONE 20 MG PO TABS
40.0000 mg | ORAL_TABLET | Freq: Every day | ORAL | 0 refills | Status: AC
Start: 2019-11-12 — End: 2019-11-17

## 2019-11-12 MED ORDER — LABETALOL HCL 5 MG/ML IV SOLN
10.0000 mg | Freq: Once | INTRAVENOUS | Status: AC
  Administered 2019-11-12: 10 mg via INTRAVENOUS
  Filled 2019-11-12: qty 4

## 2019-11-12 MED ORDER — AZITHROMYCIN 250 MG PO TABS
ORAL_TABLET | ORAL | 0 refills | Status: DC
Start: 2019-11-12 — End: 2022-03-23

## 2019-11-12 MED ORDER — METOPROLOL TARTRATE 25 MG PO TABS: 12.5000 mg | ORAL_TABLET | Freq: Two times a day (BID) | ORAL | 3 refills | Status: DC

## 2019-11-12 NOTE — TOC Progression Note (Signed)
Transition of Care [facility]) - Progression Note    Patient Details  Name: Mark [name] MRN: [id number] Date of Birth: 11-09-1950  [facility] CM/SW Contact  Golda Acre, RN Phone Number: 11/12/2019, 11:25 AM  Clinical Narrative:    On room air, no iv meds po prednisone, should move out of unit to the floors today.  Expected Discharge Plan: Home/Self Care Barriers to Discharge: Continued Medical Work up  Expected Discharge Plan and Services Expected Discharge Plan: Home/Self Care       Living arrangements for the past 2 months: Single Family Home                                       Social Determinants of Health (SDOH) Interventions    Readmission Risk Interventions No flowsheet data found.

## 2019-11-12 NOTE — Discharge Summary (Signed)
Physician Discharge Summary  Mark [name] ZOX:[id number] DOB: 15-Feb-1951 DOA: 11/10/2019  PCP: [facility]  Admit date: 11/10/2019 Discharge date: 11/12/2019  Time spent: 50 minutes  Recommendations for Outpatient Follow-up:  1. We will need outpatient neurology follow-up for severe spinal stenosis, Wernicke's?  Syndrome in addition to chronic tremor-characteristic of the tremor is that it is at rest so seems like benign tremor however cannot rule out other etiologies 2. Recommend outpatient methylmalonic acid B12 level in addition to other testing 3. Recommend Chem-12, CBC in about 1 week 4. Going home with [facility] and RN as well as Child psychotherapistsocial worker given frailty and high likelihood of readmission 5. Patient decided to discharge despite recommendation to go to skilled facility and I have had an extensive discussion with both him and his son on multiple days regarding frailty and lack of safety net-they understand this decision and the patient's wife will be looking in on the patient in the outpatient setting  Discharge Diagnoses:  Principal Problem:   Asthma exacerbation Active Problems:   Tremor   Alcohol abuse with intoxication (HCC)   Hypertensive urgency   Transaminitis   Discharge Condition: Guarded  Diet recommendation: Heart healthy  Filed Weights   11/10/19 1831  Weight: 77.1 kg    History of present illness:  1964 male, multiple ED visits since 09/2019-known EtOH habituation-laceration 09/09/2019 repaired sent home-read presented 10/28/2019 wheeze, S OB, rectal bleed-it was felt at that time by EDP asthma exacerbation-apparently drinks 5-6 bottles of liquor daily-he felt better and wanted to go home-he declined a rectal exam in the ED was given Prilosec steroids on discharge-hemoglobin was normal.  Return to ED 11/10/2019-S OB-wheeze-main complaint shortness of breath, tremors Rx albuterol X1 + CXR negative, CT head negative, RUQ ultrasound =  fatty liver MRI brain-C3-C4 moderate to severe spinal stenosis generalized age-related cerebral atrophy + microvascular disease  Hospital Course:  Acute asthma exacerbation Less wheezing per patient-seems stable at this time-change Solu-Medrol to from every 6 to every 12 We will continue inhalers at this time  12 EtOH,?  Wernicke's ? tremors Etiology of tremors unclear I do not think he has Wernicke's-I think that beta agonist is probably causing some of his tremors  I will refer the [facility] as an outpatient and will see if they can take him as an i outpatient for further management and recommendations Would recommend labs as above  HTN urgency tachycardia likely related to withdrawal from alcohol Continue hydrochlorothiazide 12.5 losartan 25 amlodipine 10 and increased hydralazine to 20 every 4 as needed for blood pressure above 170  Alcoholic hepatitis Mild AST/ALT 95/81 ammonia is slightly elevated however he is not confused-hold lactulose unless confusion occurs   Discharge Exam: Vitals:   11/12/19 1200 11/12/19 1300  BP: 136/85 (!) 156/88  Pulse: 93 93  Resp: 19 (!) 21  Temp: 97.9 F (36.6 C)   SpO2: 93% 92%    General: awake alert coherent in nad no focal deficit-looks stated age Cardiovascular: s1 s2 no m/r/g Respiratory: clear no rales no rhonchi abd soft nt nf  Discharge Instructions   Discharge Instructions    Diet - low sodium heart healthy   Complete by: As directed    Discharge instructions   Complete by: As directed    We will attempt to set up home health and get physical therapy as well as a Child psychotherapistsocial worker out to help you create a safety net and ensure that you  do not return to the hospital with worsening of your current symptoms I do not think your asthma was extremely poorly controlled but I would recommend that you watch your diet as well as your habits (stop smoking half pack per day) and please ensure that you follow-up with  health care or your primary care physician in about a week and get some labs You also have a tremor and although this may be secondary to chronic use of alcohol I think that it warrants work-up in the outpatient setting by a neurologist-I will provide you with the name for neurologist in the next 1 month please try to follow-up with this neurologist and see their thoughts on the matter You will also be prescribed with some steroids on discharge and a Z-Pak to help you clear up your sputum I would also recommend that a lung doctor or primary care physician perform spirometry to determine if this is asthma or COPD that we are treating as you may have a component of both   Increase activity slowly   Complete by: As directed      Allergies as of 11/12/2019   No Known Allergies     Medication List    TAKE these medications   albuterol 108 (90 Base) MCG/ACT inhaler Commonly known as: VENTOLIN HFA Inhale 2 puffs into the lungs every 4 (four) hours as needed for wheezing or shortness of breath.   amLODipine 10 MG tablet Commonly known as: NORVASC Take 1 tablet (10 mg total) by mouth daily.   azithromycin 250 MG tablet Commonly known as: Zithromax Z-Pak Take 500 mg daily x 1 day then 250 mg daily x 4 days   cetirizine 10 MG tablet Commonly known as: ZyrTEC Allergy Take 1 tablet (10 mg total) by mouth daily.   hydrochlorothiazide 12.5 MG tablet Commonly known as: HYDRODIURIL Take 1 tablet (12.5 mg total) by mouth daily.   losartan 25 MG tablet Commonly known as: COZAAR Take 25 mg by mouth daily.   meclizine 25 MG tablet Commonly known as: ANTIVERT Take 1 tablet (25 mg total) by mouth 3 (three) times daily as needed for dizziness.   metoprolol tartrate 25 MG tablet Commonly known as: LOPRESSOR Take 0.5 tablets (12.5 mg total) by mouth 2 (two) times daily.   multivitamin with minerals Tabs tablet Take 1 tablet by mouth daily.   omeprazole 20 MG capsule Commonly known as:  PRILOSEC Take 1 capsule (20 mg total) by mouth daily.   predniSONE 20 MG tablet Commonly known as: DELTASONE Take 2 tablets (40 mg total) by mouth daily for 5 days. What changed: Another medication with the same name was removed. Continue taking this medication, and follow the directions you see here.   Symbicort 160-4.5 MCG/ACT inhaler Generic drug: budesonide-formoterol Inhale 2 puffs into the lungs daily.   traZODone 100 MG tablet Commonly known as: DESYREL Take 1 tablet (100 mg total) by mouth at bedtime as needed for sleep.      No Known Allergies    The results of significant diagnostics from this hospitalization (including imaging, microbiology, ancillary and laboratory) are listed below for reference.    Significant Diagnostic Studies: DG Chest 2 View  Result Date: 10/28/2019 CLINICAL DATA:  69 year old male with history of shortness of breath worsening over the past week. EXAM: CHEST - 2 VIEW COMPARISON:  Chest x-ray 01/24/2019. FINDINGS: Lung volumes are normal. No consolidative airspace disease. No pleural effusions. No pneumothorax. No pulmonary nodule or mass noted. Pulmonary vasculature and  the cardiomediastinal silhouette are within normal limits. IMPRESSION: No radiographic evidence of acute cardiopulmonary disease. Electronically Signed   By: Trudie Reed M.D.   On: 10/28/2019 11:08   CT Head Wo Contrast  Result Date: 11/10/2019 CLINICAL DATA:  Headache EXAM: CT HEAD WITHOUT CONTRAST TECHNIQUE: Contiguous axial images were obtained from the base of the skull through the vertex without intravenous contrast. COMPARISON:  CT 09/09/2019 FINDINGS: Brain: No acute territorial infarction, hemorrhage or intracranial mass. Mild atrophy. Mild hypodensity in the white matter consistent with chronic small vessel ischemic change. Stable ventricle size Vascular: No hyperdense vessels.  Carotid vascular calcification Skull: Normal. Negative for fracture or focal lesion.  Sinuses/Orbits: Mucosal thickening in the ethmoid sinuses Other: None IMPRESSION: 1. No CT evidence for acute intracranial abnormality. 2. Atrophy and mild chronic small vessel ischemic change of the white matter Electronically Signed   By: Jasmine Pang M.D.   On: 11/10/2019 21:12   MR Brain Wo Contrast (neuro protocol)  Result Date: 11/11/2019 CLINICAL DATA:  Initial evaluation for acute ataxia. EXAM: MRI HEAD WITHOUT CONTRAST TECHNIQUE: Multiplanar, multiecho pulse sequences of the brain and surrounding structures were obtained without intravenous contrast. COMPARISON:  Prior head CT from earlier same day. FINDINGS: Brain: Examination moderately degraded by motion artifact. Generalized age-related cerebral atrophy. Patchy T2/FLAIR hyperintensity within the periventricular and deep white matter as well as the pons, most consistent with chronic small vessel ischemic disease, mild in nature. No abnormal foci of restricted diffusion to suggest acute or subacute ischemia. Gray-white matter differentiation maintained. No encephalomalacia to suggest chronic cortical infarction. No evidence for acute or chronic intracranial hemorrhage. No mass lesion, midline shift or mass effect. No hydrocephalus or extra-axial fluid collection. Pituitary gland suprasellar region normal. Midline structures intact. Vascular: Major intracranial vascular flow voids are maintained. Skull and upper cervical spine: Craniocervical junction normal. Degenerative spondylosis noted at C3-4 with resultant moderate to severe spinal stenosis (series 7, image 12). Bone marrow signal intensity within normal limits. No scalp soft tissue abnormality. Sinuses/Orbits: Patient status post ocular lens replacement on the left. Globes and orbital soft tissues demonstrate no acute finding. Paranasal sinuses are clear. No mastoid effusion. Inner ear structures grossly normal. Other: None. IMPRESSION: 1. No acute intracranial abnormality. 2. Generalized  age-related cerebral atrophy with mild chronic small vessel ischemic disease. 3. Degenerative spondylosis at C3-4 with resultant moderate to severe spinal stenosis. Finding could be further assessed with dedicated MRI of the cervical spine as clinically warranted. Electronically Signed   By: Rise Mu M.D.   On: 11/11/2019 00:07   DG Chest Portable 1 View  Result Date: 11/10/2019 CLINICAL DATA:  Wheezing. EXAM: PORTABLE CHEST 1 VIEW COMPARISON:  10/28/2019 FINDINGS: The heart size and mediastinal contours are within normal limits. Both lungs are clear. The visualized skeletal structures are unremarkable. IMPRESSION: No active disease. Electronically Signed   By: Katherine Mantle M.D.   On: 11/10/2019 19:42   US Abdomen Limited RUQ  Result Date: 11/10/2019 CLINICAL DATA:  69 year old male with elevated LFTs. EXAM: ULTRASOUND ABDOMEN LIMITED RIGHT UPPER QUADRANT COMPARISON:  None. FINDINGS: Gallbladder: No gallstones or wall thickening visualized. No sonographic Murphy sign noted by sonographer. Common bile duct: Diameter: 3 mm Liver: There is advanced diffuse increased liver echogenicity most commonly seen in the setting of fatty infiltration. Superimposed inflammation or fibrosis is not excluded. Clinical correlation is recommended. Portal vein is patent on color Doppler imaging with normal direction of blood flow towards the liver. Other: None. IMPRESSION: Fatty liver, otherwise  unremarkable right upper quadrant ultrasound. Electronically Signed   By: Elgie Collard M.D.   On: 11/10/2019 23:15    Microbiology: Recent Results (from the past 240 hour(s))  Respiratory Panel by RT PCR (Flu A&B, Covid) - Nasopharyngeal Swab     Status: None   Collection Time: 11/10/19 10:07 PM   Specimen: Nasopharyngeal Swab  Result Value Ref Range Status   SARS Coronavirus 2 by RT PCR NEGATIVE NEGATIVE Final    Comment: (NOTE) SARS-CoV-2 target nucleic acids are NOT DETECTED. The SARS-CoV-2 RNA is  generally detectable in upper respiratoy specimens during the acute phase of infection. The lowest concentration of SARS-CoV-2 viral copies this assay can detect is 131 copies/mL. A negative result does not preclude SARS-Cov-2 infection and should not be used as the sole basis for treatment or other patient management decisions. A negative result may occur with  improper specimen collection/handling, submission of specimen other than nasopharyngeal swab, presence of viral mutation(s) within the areas targeted by this assay, and inadequate number of viral copies (<131 copies/mL). A negative result must be combined with clinical observations, patient history, and epidemiological information. The expected result is Negative. Fact Sheet for Patients:  https://www.moore.com/ Fact Sheet for Healthcare Providers:  https://www.young.biz/ This test is not yet ap proved or cleared by the [place] FDA and  has been authorized for detection and/or diagnosis of SARS-CoV-2 by FDA under an Emergency Use Authorization (EUA). This EUA will remain  in effect (meaning this test can be used) for the duration of the COVID-19 declaration under Section 564(b)(1) of the Act, 21 U.S.C. section 360bbb-3(b)(1), unless the authorization is terminated or revoked sooner.    Influenza A by PCR NEGATIVE NEGATIVE Final   Influenza B by PCR NEGATIVE NEGATIVE Final    Comment: (NOTE) The Xpert Xpress SARS-CoV-2/FLU/RSV assay is intended as an aid in  the diagnosis of influenza from Nasopharyngeal swab specimens and  should not be used as a sole basis for treatment. Nasal washings and  aspirates are unacceptable for Xpert Xpress SARS-CoV-2/FLU/RSV  testing. Fact Sheet for Patients: https://www.moore.com/ Fact Sheet for Healthcare Providers: https://www.young.biz/ This test is not yet approved or cleared by the [place] FDA and   has been authorized for detection and/or diagnosis of SARS-CoV-2 by  FDA under an Emergency Use Authorization (EUA). This EUA will remain  in effect (meaning this test can be used) for the duration of the  Covid-19 declaration under Section 564(b)(1) of the Act, 21  U.S.C. section 360bbb-3(b)(1), unless the authorization is  terminated or revoked. Performed at [facility], [address] [place], [place] [address]   MRSA PCR Screening     Status: None   Collection Time: 11/11/19 12:02 AM   Specimen: Nasal Mucosa; Nasopharyngeal  Result Value Ref Range Status   MRSA by PCR NEGATIVE NEGATIVE Final    Comment:        The GeneXpert MRSA Assay (FDA approved for NASAL specimens only), is one component of a comprehensive MRSA colonization surveillance program. It is not intended to diagnose MRSA infection nor to guide or monitor treatment for MRSA infections. Performed at [facility], [address] [place], [place] [address]      Labs: Basic Metabolic Panel: Recent Labs  Lab 11/10/19 1858 11/11/19 0229 11/12/19 0737  NA 139  --  136  K 3.6  --  3.7  CL 102  --  97*  CO2 24  --  26  GLUCOSE 72  --  161*  BUN 9  --  12  CREATININE 0.91  --  1.04  CALCIUM 8.9  --  9.4  MG  --  2.5* 2.4  PHOS  --  3.0  --    Liver Function Tests: Recent Labs  Lab 11/10/19 1858 11/12/19 0737  AST 95* 65*  ALT 81* 72*  ALKPHOS 84 83  BILITOT 0.8 1.3*  PROT 7.5 7.6  ALBUMIN 4.0 3.8   No results for input(s): LIPASE, AMYLASE in the last 168 hours. Recent Labs  Lab 11/10/19 2119 11/11/19 0229  AMMONIA 42* 43*   CBC: Recent Labs  Lab 11/10/19 1858 11/12/19 0737  WBC 6.1 11.0*  NEUTROABS 4.0  --   HGB 13.6 15.6  HCT 39.4 44.5  MCV 100.8* 98.9  PLT 164 195   Cardiac Enzymes: No results for input(s): CKTOTAL, CKMB, CKMBINDEX, TROPONINI in the last 168 hours. BNP: BNP (last 3 results) No results for input(s): BNP in the last 8760  hours.  ProBNP (last 3 results) No results for input(s): PROBNP in the last 8760 hours.  CBG: No results for input(s): GLUCAP in the last 168 hours.     Signed:  Rhetta Mura MD   [facility] 11/12/2019, 2:31 PM

## 2019-11-12 NOTE — Progress Notes (Signed)
Pt discharged. AVS went over with pt and he stated he had no further questions. PIV was removed. Helped pt get dressed and wheeled him down to the front.

## 2019-11-15 LAB — VITAMIN B1: Vitamin B1 (Thiamine): 91.7 nmol/L (ref 66.5–200.0)

## 2020-03-18 ENCOUNTER — Other Ambulatory Visit: Payer: Self-pay | Admitting: Student

## 2020-03-18 DIAGNOSIS — K4090 Unilateral inguinal hernia, without obstruction or gangrene, not specified as recurrent: Secondary | ICD-10-CM

## 2020-03-29 ENCOUNTER — Ambulatory Visit
Admission: RE | Admit: 2020-03-29 | Discharge: 2020-03-29 | Disposition: A | Discharge status: Home / Self Care | Payer: Medicare HMO | Source: Ambulatory Visit | Attending: Student | Admitting: Student

## 2020-03-29 ENCOUNTER — Other Ambulatory Visit: Payer: Self-pay | Admitting: Student

## 2020-03-29 DIAGNOSIS — R52 Pain, unspecified: Secondary | ICD-10-CM

## 2020-03-30 ENCOUNTER — Other Ambulatory Visit: Payer: Self-pay | Admitting: Student

## 2020-03-30 DIAGNOSIS — K4090 Unilateral inguinal hernia, without obstruction or gangrene, not specified as recurrent: Secondary | ICD-10-CM

## 2020-04-01 ENCOUNTER — Inpatient Hospital Stay: Admission: RE | Admit: 2020-04-01 | Payer: Medicare HMO | Source: Ambulatory Visit

## 2020-04-13 ENCOUNTER — Other Ambulatory Visit: Payer: Medicare HMO

## 2020-04-27 ENCOUNTER — Ambulatory Visit: Payer: Medicare HMO | Admitting: Orthopaedic Surgery

## 2020-05-18 ENCOUNTER — Ambulatory Visit: Payer: Medicare HMO | Admitting: Orthopaedic Surgery

## 2020-08-03 ENCOUNTER — Other Ambulatory Visit: Payer: Self-pay | Admitting: Student

## 2020-08-03 DIAGNOSIS — R1031 Right lower quadrant pain: Secondary | ICD-10-CM

## 2021-01-18 ENCOUNTER — Other Ambulatory Visit: Payer: Self-pay | Admitting: Student

## 2021-07-05 ENCOUNTER — Other Ambulatory Visit: Payer: Self-pay | Admitting: Student

## 2021-07-05 DIAGNOSIS — K4090 Unilateral inguinal hernia, without obstruction or gangrene, not specified as recurrent: Secondary | ICD-10-CM

## 2021-11-20 ENCOUNTER — Other Ambulatory Visit: Payer: Self-pay

## 2021-11-20 ENCOUNTER — Emergency Department (HOSPITAL_BASED_OUTPATIENT_CLINIC_OR_DEPARTMENT_OTHER): Payer: Medicare HMO | Admitting: Radiology

## 2021-11-20 ENCOUNTER — Emergency Department (HOSPITAL_BASED_OUTPATIENT_CLINIC_OR_DEPARTMENT_OTHER)
Admission: EM | Admit: 2021-11-20 | Discharge: 2021-11-20 | Disposition: A | Discharge status: Home / Self Care | Payer: Medicare HMO | Attending: Emergency Medicine | Admitting: Emergency Medicine

## 2021-11-20 ENCOUNTER — Encounter (HOSPITAL_BASED_OUTPATIENT_CLINIC_OR_DEPARTMENT_OTHER): Payer: Self-pay

## 2021-11-20 DIAGNOSIS — M25562 Pain in left knee: Secondary | ICD-10-CM | POA: Diagnosis present

## 2021-11-20 DIAGNOSIS — Z7951 Long term (current) use of inhaled steroids: Secondary | ICD-10-CM | POA: Diagnosis not present

## 2021-11-20 DIAGNOSIS — W1830XA Fall on same level, unspecified, initial encounter: Secondary | ICD-10-CM | POA: Insufficient documentation

## 2021-11-20 DIAGNOSIS — Z79899 Other long term (current) drug therapy: Secondary | ICD-10-CM | POA: Diagnosis not present

## 2021-11-20 DIAGNOSIS — J45909 Unspecified asthma, uncomplicated: Secondary | ICD-10-CM | POA: Diagnosis not present

## 2021-11-20 DIAGNOSIS — I10 Essential (primary) hypertension: Secondary | ICD-10-CM | POA: Diagnosis not present

## 2021-11-20 MED ORDER — ACETAMINOPHEN 325 MG PO TABS
650.0000 mg | ORAL_TABLET | Freq: Once | ORAL | Status: AC
  Administered 2021-11-20: 650 mg via ORAL
  Filled 2021-11-20: qty 2

## 2021-11-20 NOTE — Discharge Instructions (Signed)
Overall suspect that you have a knee bruise from your fall.  There is no fracture on your x-ray.  Recommend ice, Tylenol, ibuprofen.  Recommend weightbearing as tolerated with crutches.  Follow-up with your primary care doctor for reexamination after a week or 2.  It is possible that you could have a sprain or some other soft tissue injury to your knee and should get reevaluated. ?

## 2021-11-20 NOTE — ED Provider Notes (Signed)
?[facility] EM[facility] DEPT ?Provider Note ? ? ?CSN: [id number] ?Arrival date & time: 11/20/21  1217 ? ?  ? ?History ? ?Chief Complaint  ?Patient presents with  ? Knee Pain  ? ? ?Mark [name] is a 71 y.o. male. ? ?Patient here with left knee pain after fall several days ago.  Patient fell on a hardwood floor and injured his left knee.  He has been having pain since.  Pain is worse with movement.  Better with rest.  Denies any surgical history.  Not on blood thinners.  Did not hit his head or lose consciousness.  Has a history of asthma, hypertension.  Denies any weakness or numbness. ? ? ? ?  ? ?Home Medications ?Prior to Admission medications   ?Medication Sig Start Date End Date Taking? Authorizing Provider  ?albuterol (PROVENTIL HFA;VENTOLIN HFA) 108 (90 Base) MCG/ACT inhaler Inhale 2 puffs into the lungs every 4 (four) hours as needed for wheezing or shortness of breath. 07/03/16   Horton, Mayer Masker, MD  ?amLODipine (NORVASC) 10 MG tablet Take 1 tablet (10 mg total) by mouth daily. 11/29/15   Danelle Berry, PA-C  ?azithromycin (ZITHROMAX Z-PAK) 250 MG tablet Take 500 mg daily x 1 day then 250 mg daily x 4 days 11/12/19   Rhetta Mura, MD  ?cetirizine (ZYRTEC ALLERGY) 10 MG tablet Take 1 tablet (10 mg total) by mouth daily. ?Patient not taking: Reported on 11/10/2019 11/29/15   Danelle Berry, PA-C  ?hydrochlorothiazide (HYDRODIURIL) 12.5 MG tablet Take 1 tablet (12.5 mg total) by mouth daily. ?Patient not taking: Reported on 11/10/2019 11/29/15   Danelle Berry, PA-C  ?losartan (COZAAR) 25 MG tablet Take 25 mg by mouth daily. 07/06/19   [provider]  ?meclizine (ANTIVERT) 25 MG tablet Take 1 tablet (25 mg total) by mouth 3 (three) times daily as needed for dizziness. 07/03/16   Horton, Mayer Masker, MD  ?metoprolol tartrate (LOPRESSOR) 25 MG tablet Take 0.5 tablets (12.5 mg total) by mouth 2 (two) times daily. 11/12/19   Rhetta Mura, MD  ?Multiple Vitamin (MULTIVITAMIN WITH MINERALS)  TABS tablet Take 1 tablet by mouth daily.    [provider]  ?omeprazole (PRILOSEC) 20 MG capsule Take 1 capsule (20 mg total) by mouth daily. 10/28/19   Tegeler, Canary Brim, MD  ?[place] 160-4.5 MCG/ACT inhaler Inhale 2 puffs into the lungs daily.  10/14/19   [provider]  ?traZODone (DESYREL) 100 MG tablet Take 1 tablet (100 mg total) by mouth at bedtime as needed for sleep. 11/12/19   Rhetta Mura, MD  ?   ? ?Allergies    ?Patient has no known allergies.   ? ?Review of Systems   ?Review of Systems ? ?Physical Exam ?Updated Vital Signs ?BP (!) 148/81 (BP Location: Right Arm)   Pulse (!) 59   Temp 97.8 ?F (36.6 ?C)   Resp 14   Ht 6\' 2"  (1.88 m)   Wt 77.1 kg   SpO2 98%   BMI 21.82 kg/m?  ?Physical Exam ?Vitals and nursing note reviewed.  ?Constitutional:   ?   General: He is not in acute distress. ?   Appearance: He is well-developed. He is not ill-appearing.  ?HENT:  ?   Head: Normocephalic and atraumatic.  ?Cardiovascular:  ?   Rate and Rhythm: Normal rate and regular rhythm.  ?   Pulses: Normal pulses.  ?   Heart sounds: Normal heart sounds. No murmur heard. ?Abdominal:  ?   Palpations: Abdomen is soft.  ?Musculoskeletal:     ?  General: Tenderness present. No swelling. Normal range of motion.  ?   Cervical back: Neck supple.  ?   Comments: Tenderness to the left knee but no obvious laxity of the knee joint  ?Skin: ?   General: Skin is warm and dry.  ?   Capillary Refill: Capillary refill takes less than 2 seconds.  ?Neurological:  ?   General: No focal deficit present.  ?   Mental Status: He is alert.  ?   Sensory: No sensory deficit.  ?   Motor: No weakness.  ?Psychiatric:     ?   Mood and Affect: Mood normal.  ? ? ?ED Results / Procedures / Treatments   ?Labs ?(all labs ordered are listed, but only abnormal results are displayed) ?Labs Reviewed - No data to display ? ?EKG ?None ? ?Radiology ?DG Knee Complete 4 Views Left ? ?Result Date: 11/20/2021 ?CLINICAL DATA:  Left knee  pain, recent fall EXAM: LEFT KNEE - COMPLETE 4+ VIEW COMPARISON:  None Available. FINDINGS: Alignment is anatomic. No acute fracture. Joint spaces are preserved. Possible joint effusion. IMPRESSION: No acute fracture.  Possible joint effusion. Electronically Signed   By: Guadlupe Spanish M.D.   On: 11/20/2021 13:01   ? ?Procedures ?Procedures  ? ? ?Medications Ordered in ED ?Medications  ?acetaminophen (TYLENOL) tablet 650 mg (has no administration in time range)  ? ? ?ED Course/ Medical Decision Making/ A&P ?  ?                        ?Medical Decision Making ?Amount and/or Complexity of Data Reviewed ?Radiology: ordered. ? ?Risk ?OTC drugs. ? ? ?Mark [name] is here with left knee pain after fall several days ago.  Differential diagnosis is contusion versus fracture.  X-ray per my review and interpretation shows no fracture.  May be mild effusion.  Overall suspect traumatic contusion.  Recommend weightbearing as tolerated with crutches.  Recommend ice, Tylenol, ibuprofen.  He is neurovascular neuromuscularly intact.  Discharged in good condition.  Recommend follow-up with primary care follow-up for repeat examination.  Possible that he could have a sprain or some other soft tissue injury. ? ?This chart was dictated using voice recognition software.  Despite best efforts to proofread,  errors can occur which can change the documentation meaning.  ? ? ? ? ? ? ? ?Final Clinical Impression(s) / ED Diagnoses ?Final diagnoses:  ?Acute pain of left knee  ? ? ?Rx / DC Orders ?ED Discharge Orders   ? ? None  ? ?  ? ? ?  ?Virgina Norfolk, DO ?11/20/21 1358 ? ?

## 2021-11-20 NOTE — ED Notes (Signed)
EDP at bedside  

## 2021-11-20 NOTE — ED Notes (Signed)
Patient verbalizes understanding of discharge instructions. Opportunity for questioning and answers were provided. Armband removed by staff, pt discharged from ED to home 

## 2021-11-20 NOTE — ED Notes (Addendum)
Ace wrap applied to L knee and crutches provided, instructed pt how to use crutches. Spoke to patient about future discharge plans, states he will start making calls to friends for a ride. Snack provided. ?

## 2021-11-20 NOTE — ED Triage Notes (Signed)
Patient BIB [facility] From Home. ? ?Endorses rolling OOB on Friday accidentally and fell on Hardwood Floor. States he injured his Left Knee and pain has worsened since. Pain is worse with Movement. ? ?BIB Stretcher. NAD Noted during Triage. A&Ox4. GCS 15. No Obvious Deformity noted.  ?

## 2021-11-20 NOTE — ED Notes (Signed)
Pt is gong to call son from lobby ?

## 2022-03-09 ENCOUNTER — Encounter (HOSPITAL_COMMUNITY): Payer: Self-pay | Admitting: Internal Medicine

## 2022-03-09 ENCOUNTER — Inpatient Hospital Stay (HOSPITAL_COMMUNITY)
Admission: EM | Admit: 2022-03-09 | Discharge: 2022-03-23 | DRG: 871 | Disposition: A | Discharge status: Home Health | Payer: Medicare HMO | Attending: Internal Medicine | Admitting: Internal Medicine

## 2022-03-09 ENCOUNTER — Emergency Department (HOSPITAL_COMMUNITY): Payer: Medicare HMO

## 2022-03-09 ENCOUNTER — Other Ambulatory Visit: Payer: Self-pay

## 2022-03-09 DIAGNOSIS — E8770 Fluid overload, unspecified: Secondary | ICD-10-CM

## 2022-03-09 DIAGNOSIS — R0602 Shortness of breath: Secondary | ICD-10-CM | POA: Diagnosis present

## 2022-03-09 DIAGNOSIS — E538 Deficiency of other specified B group vitamins: Secondary | ICD-10-CM | POA: Diagnosis present

## 2022-03-09 DIAGNOSIS — I110 Hypertensive heart disease with heart failure: Secondary | ICD-10-CM | POA: Diagnosis present

## 2022-03-09 DIAGNOSIS — J9601 Acute respiratory failure with hypoxia: Secondary | ICD-10-CM | POA: Diagnosis present

## 2022-03-09 DIAGNOSIS — E860 Dehydration: Secondary | ICD-10-CM | POA: Diagnosis present

## 2022-03-09 DIAGNOSIS — J440 Chronic obstructive pulmonary disease with acute lower respiratory infection: Secondary | ICD-10-CM | POA: Diagnosis present

## 2022-03-09 DIAGNOSIS — Z7984 Long term (current) use of oral hypoglycemic drugs: Secondary | ICD-10-CM

## 2022-03-09 DIAGNOSIS — I493 Ventricular premature depolarization: Secondary | ICD-10-CM | POA: Diagnosis not present

## 2022-03-09 DIAGNOSIS — I5043 Acute on chronic combined systolic (congestive) and diastolic (congestive) heart failure: Secondary | ICD-10-CM | POA: Diagnosis present

## 2022-03-09 DIAGNOSIS — R7401 Elevation of levels of liver transaminase levels: Secondary | ICD-10-CM | POA: Diagnosis present

## 2022-03-09 DIAGNOSIS — J45901 Unspecified asthma with (acute) exacerbation: Secondary | ICD-10-CM | POA: Diagnosis not present

## 2022-03-09 DIAGNOSIS — I21A1 Myocardial infarction type 2: Secondary | ICD-10-CM | POA: Diagnosis present

## 2022-03-09 DIAGNOSIS — I358 Other nonrheumatic aortic valve disorders: Secondary | ICD-10-CM | POA: Diagnosis present

## 2022-03-09 DIAGNOSIS — E872 Acidosis, unspecified: Secondary | ICD-10-CM | POA: Diagnosis present

## 2022-03-09 DIAGNOSIS — D638 Anemia in other chronic diseases classified elsewhere: Secondary | ICD-10-CM | POA: Diagnosis present

## 2022-03-09 DIAGNOSIS — N400 Enlarged prostate without lower urinary tract symptoms: Secondary | ICD-10-CM | POA: Diagnosis present

## 2022-03-09 DIAGNOSIS — R778 Other specified abnormalities of plasma proteins: Secondary | ICD-10-CM | POA: Diagnosis not present

## 2022-03-09 DIAGNOSIS — Z720 Tobacco use: Secondary | ICD-10-CM | POA: Diagnosis present

## 2022-03-09 DIAGNOSIS — Z800 Family history of malignant neoplasm of digestive organs: Secondary | ICD-10-CM

## 2022-03-09 DIAGNOSIS — Z20822 Contact with and (suspected) exposure to covid-19: Secondary | ICD-10-CM | POA: Diagnosis present

## 2022-03-09 DIAGNOSIS — F17210 Nicotine dependence, cigarettes, uncomplicated: Secondary | ICD-10-CM | POA: Diagnosis present

## 2022-03-09 DIAGNOSIS — I4891 Unspecified atrial fibrillation: Secondary | ICD-10-CM | POA: Diagnosis present

## 2022-03-09 DIAGNOSIS — Z7901 Long term (current) use of anticoagulants: Secondary | ICD-10-CM

## 2022-03-09 DIAGNOSIS — Z79899 Other long term (current) drug therapy: Secondary | ICD-10-CM | POA: Diagnosis not present

## 2022-03-09 DIAGNOSIS — Z7951 Long term (current) use of inhaled steroids: Secondary | ICD-10-CM

## 2022-03-09 DIAGNOSIS — F1020 Alcohol dependence, uncomplicated: Secondary | ICD-10-CM | POA: Diagnosis present

## 2022-03-09 DIAGNOSIS — R5381 Other malaise: Secondary | ICD-10-CM | POA: Diagnosis present

## 2022-03-09 DIAGNOSIS — I472 Ventricular tachycardia, unspecified: Secondary | ICD-10-CM | POA: Diagnosis present

## 2022-03-09 DIAGNOSIS — R6520 Severe sepsis without septic shock: Secondary | ICD-10-CM | POA: Diagnosis present

## 2022-03-09 DIAGNOSIS — N179 Acute kidney failure, unspecified: Secondary | ICD-10-CM | POA: Diagnosis present

## 2022-03-09 DIAGNOSIS — A419 Sepsis, unspecified organism: Secondary | ICD-10-CM | POA: Diagnosis present

## 2022-03-09 DIAGNOSIS — F1029 Alcohol dependence with unspecified alcohol-induced disorder: Secondary | ICD-10-CM | POA: Diagnosis not present

## 2022-03-09 DIAGNOSIS — J441 Chronic obstructive pulmonary disease with (acute) exacerbation: Secondary | ICD-10-CM | POA: Diagnosis present

## 2022-03-09 DIAGNOSIS — I5031 Acute diastolic (congestive) heart failure: Secondary | ICD-10-CM | POA: Diagnosis not present

## 2022-03-09 DIAGNOSIS — J449 Chronic obstructive pulmonary disease, unspecified: Secondary | ICD-10-CM

## 2022-03-09 DIAGNOSIS — J189 Pneumonia, unspecified organism: Secondary | ICD-10-CM | POA: Diagnosis present

## 2022-03-09 DIAGNOSIS — R7989 Other specified abnormal findings of blood chemistry: Secondary | ICD-10-CM | POA: Diagnosis not present

## 2022-03-09 DIAGNOSIS — R739 Hyperglycemia, unspecified: Secondary | ICD-10-CM | POA: Diagnosis present

## 2022-03-09 DIAGNOSIS — G9341 Metabolic encephalopathy: Secondary | ICD-10-CM | POA: Diagnosis present

## 2022-03-09 DIAGNOSIS — F1010 Alcohol abuse, uncomplicated: Secondary | ICD-10-CM | POA: Diagnosis not present

## 2022-03-09 DIAGNOSIS — J4541 Moderate persistent asthma with (acute) exacerbation: Secondary | ICD-10-CM | POA: Diagnosis not present

## 2022-03-09 DIAGNOSIS — J4551 Severe persistent asthma with (acute) exacerbation: Secondary | ICD-10-CM

## 2022-03-09 LAB — CBC WITH DIFFERENTIAL/PLATELET
Abs Immature Granulocytes: 0 10*3/uL (ref 0.00–0.07)
Basophils Absolute: 0.3 10*3/uL — ABNORMAL HIGH (ref 0.0–0.1)
Basophils Relative: 1 %
Eosinophils Absolute: 0 10*3/uL (ref 0.0–0.5)
Eosinophils Relative: 0 %
HCT: 40.4 % (ref 39.0–52.0)
Hemoglobin: 13.4 g/dL (ref 13.0–17.0)
Lymphocytes Relative: 4 %
Lymphs Abs: 1.4 10*3/uL (ref 0.7–4.0)
MCH: 33.8 pg (ref 26.0–34.0)
MCHC: 33.2 g/dL (ref 30.0–36.0)
MCV: 102 fL — ABNORMAL HIGH (ref 80.0–100.0)
Monocytes Absolute: 2.7 10*3/uL — ABNORMAL HIGH (ref 0.1–1.0)
Monocytes Relative: 8 %
Neutro Abs: 29.8 10*3/uL — ABNORMAL HIGH (ref 1.7–7.7)
Neutrophils Relative %: 87 %
Platelets: 368 10*3/uL (ref 150–400)
RBC: 3.96 MIL/uL — ABNORMAL LOW (ref 4.22–5.81)
RDW: 12.6 % (ref 11.5–15.5)
WBC: 34.3 10*3/uL — ABNORMAL HIGH (ref 4.0–10.5)
nRBC: 0 /100 WBC
nRBC: 0.1 % (ref 0.0–0.2)

## 2022-03-09 LAB — I-STAT VENOUS BLOOD GAS, ED
Acid-base deficit: 6 mmol/L — ABNORMAL HIGH (ref 0.0–2.0)
Bicarbonate: 21 mmol/L (ref 20.0–28.0)
Calcium, Ion: 1.02 mmol/L — ABNORMAL LOW (ref 1.15–1.40)
HCT: 42 % (ref 39.0–52.0)
Hemoglobin: 14.3 g/dL (ref 13.0–17.0)
O2 Saturation: 85 %
Potassium: 3.7 mmol/L (ref 3.5–5.1)
Sodium: 133 mmol/L — ABNORMAL LOW (ref 135–145)
TCO2: 22 mmol/L (ref 22–32)
pCO2, Ven: 45.7 mmHg (ref 44–60)
pH, Ven: 7.27 (ref 7.25–7.43)
pO2, Ven: 57 mmHg — ABNORMAL HIGH (ref 32–45)

## 2022-03-09 LAB — RAPID URINE DRUG SCREEN, HOSP PERFORMED
Amphetamines: NOT DETECTED
Barbiturates: NOT DETECTED
Benzodiazepines: NOT DETECTED
Cocaine: NOT DETECTED
Opiates: NOT DETECTED
Tetrahydrocannabinol: NOT DETECTED

## 2022-03-09 LAB — COMPREHENSIVE METABOLIC PANEL
ALT: 42 U/L (ref 0–44)
AST: 70 U/L — ABNORMAL HIGH (ref 15–41)
Albumin: 2.4 g/dL — ABNORMAL LOW (ref 3.5–5.0)
Alkaline Phosphatase: 96 U/L (ref 38–126)
Anion gap: 24 — ABNORMAL HIGH (ref 5–15)
BUN: 46 mg/dL — ABNORMAL HIGH (ref 8–23)
CO2: 18 mmol/L — ABNORMAL LOW (ref 22–32)
Calcium: 9.3 mg/dL (ref 8.9–10.3)
Chloride: 94 mmol/L — ABNORMAL LOW (ref 98–111)
Creatinine, Ser: 2.86 mg/dL — ABNORMAL HIGH (ref 0.61–1.24)
GFR, Estimated: 23 mL/min — ABNORMAL LOW (ref >60)
Glucose, Bld: 159 mg/dL — ABNORMAL HIGH (ref 70–99)
Potassium: 3.6 mmol/L (ref 3.5–5.1)
Sodium: 136 mmol/L (ref 135–145)
Total Bilirubin: 1 mg/dL (ref 0.3–1.2)
Total Protein: 8.5 g/dL — ABNORMAL HIGH (ref 6.5–8.1)

## 2022-03-09 LAB — TYPE AND SCREEN
ABO/RH(D): O POS
Antibody Screen: NEGATIVE

## 2022-03-09 LAB — BASIC METABOLIC PANEL
Anion gap: 19 — ABNORMAL HIGH (ref 5–15)
BUN: 47 mg/dL — ABNORMAL HIGH (ref 8–23)
CO2: 20 mmol/L — ABNORMAL LOW (ref 22–32)
Calcium: 8.6 mg/dL — ABNORMAL LOW (ref 8.9–10.3)
Chloride: 97 mmol/L — ABNORMAL LOW (ref 98–111)
Creatinine, Ser: 2.04 mg/dL — ABNORMAL HIGH (ref 0.61–1.24)
GFR, Estimated: 35 mL/min — ABNORMAL LOW (ref >60)
Glucose, Bld: 229 mg/dL — ABNORMAL HIGH (ref 70–99)
Potassium: 3.8 mmol/L (ref 3.5–5.1)
Sodium: 136 mmol/L (ref 135–145)

## 2022-03-09 LAB — CREATININE, URINE, RANDOM: Creatinine, Urine: 144 mg/dL

## 2022-03-09 LAB — URINALYSIS, ROUTINE W REFLEX MICROSCOPIC
Bacteria, UA: NONE SEEN
Bilirubin Urine: NEGATIVE
Glucose, UA: NEGATIVE mg/dL
Hgb urine dipstick: NEGATIVE
Ketones, ur: NEGATIVE mg/dL
Leukocytes,Ua: NEGATIVE
Nitrite: NEGATIVE
Protein, ur: 30 mg/dL — AB
Specific Gravity, Urine: 1.016 (ref 1.005–1.030)
pH: 5 (ref 5.0–8.0)

## 2022-03-09 LAB — ETHANOL: Alcohol, Ethyl (B): <10 mg/dL (ref <10)

## 2022-03-09 LAB — BRAIN NATRIURETIC PEPTIDE: B Natriuretic Peptide: 1309.2 pg/mL — ABNORMAL HIGH (ref 0.0–100.0)

## 2022-03-09 LAB — TROPONIN I (HIGH SENSITIVITY)
Troponin I (High Sensitivity): 181 ng/L (ref <18)
Troponin I (High Sensitivity): 150 ng/L (ref <18)
Troponin I (High Sensitivity): 143 ng/L (ref <18)

## 2022-03-09 LAB — RESP PANEL BY RT-PCR (FLU A&B, COVID) ARPGX2
Influenza A by PCR: NEGATIVE
Influenza B by PCR: NEGATIVE
SARS Coronavirus 2 by RT PCR: NEGATIVE

## 2022-03-09 LAB — LACTIC ACID, PLASMA
Lactic Acid, Venous: 7.6 mmol/L (ref 0.5–1.9)
Lactic Acid, Venous: 4.6 mmol/L (ref 0.5–1.9)

## 2022-03-09 LAB — STREP PNEUMONIAE URINARY ANTIGEN: Strep Pneumo Urinary Antigen: NEGATIVE

## 2022-03-09 LAB — ACETAMINOPHEN LEVEL: Acetaminophen (Tylenol), Serum: <10 ug/mL — ABNORMAL LOW (ref 10–30)

## 2022-03-09 LAB — SODIUM, URINE, RANDOM: Sodium, Ur: 32 mmol/L

## 2022-03-09 LAB — LIPASE, BLOOD: Lipase: 20 U/L (ref 11–51)

## 2022-03-09 LAB — MAGNESIUM: Magnesium: 3.4 mg/dL — ABNORMAL HIGH (ref 1.7–2.4)

## 2022-03-09 LAB — AMMONIA: Ammonia: 35 umol/L (ref 9–35)

## 2022-03-09 LAB — SALICYLATE LEVEL: Salicylate Lvl: <7 mg/dL — ABNORMAL LOW (ref 7.0–30.0)

## 2022-03-09 LAB — PROTIME-INR
INR: 1.3 — ABNORMAL HIGH (ref 0.8–1.2)
Prothrombin Time: 16 seconds — ABNORMAL HIGH (ref 11.4–15.2)

## 2022-03-09 LAB — CK: Total CK: 173 U/L (ref 49–397)

## 2022-03-09 LAB — TSH: TSH: 0.195 u[IU]/mL — ABNORMAL LOW (ref 0.350–4.500)

## 2022-03-09 LAB — HIV ANTIBODY (ROUTINE TESTING W REFLEX): HIV Screen 4th Generation wRfx: NONREACTIVE

## 2022-03-09 MED ORDER — SODIUM CHLORIDE 0.9 % IV SOLN: INTRAVENOUS | Status: AC

## 2022-03-09 MED ORDER — ACETAMINOPHEN 325 MG PO TABS: 650.0000 mg | ORAL_TABLET | Freq: Four times a day (QID) | ORAL | Status: DC | PRN

## 2022-03-09 MED ORDER — LORATADINE 10 MG PO TABS
10.0000 mg | ORAL_TABLET | Freq: Every day | ORAL | Status: DC
  Administered 2022-03-10 – 2022-03-23 (×14): 10 mg via ORAL
  Filled 2022-03-09 (×14): qty 1

## 2022-03-09 MED ORDER — TRAZODONE HCL 100 MG PO TABS
100.0000 mg | ORAL_TABLET | Freq: Every evening | ORAL | Status: DC | PRN
Start: 2022-03-09 — End: 2022-03-23
  Administered 2022-03-15 – 2022-03-22 (×4): 100 mg via ORAL
  Filled 2022-03-09 (×3): qty 1
  Filled 2022-03-09: qty 2
  Filled 2022-03-09: qty 1

## 2022-03-09 MED ORDER — LACTATED RINGERS IV SOLN: INTRAVENOUS | Status: AC

## 2022-03-09 MED ORDER — IPRATROPIUM BROMIDE 0.02 % IN SOLN
RESPIRATORY_TRACT | Status: AC
  Administered 2022-03-09: 1 mL
  Filled 2022-03-09: qty 2.5

## 2022-03-09 MED ORDER — SODIUM CHLORIDE 0.9 % IV SOLN
1.0000 mg | Freq: Once | INTRAVENOUS | Status: DC
  Filled 2022-03-09: qty 0.2

## 2022-03-09 MED ORDER — LACTATED RINGERS IV BOLUS
1300.0000 mL | Freq: Once | INTRAVENOUS | Status: AC
  Administered 2022-03-09: 1300 mL via INTRAVENOUS

## 2022-03-09 MED ORDER — THIAMINE HCL 100 MG/ML IJ SOLN
100.0000 mg | Freq: Every day | INTRAMUSCULAR | Status: DC
  Administered 2022-03-12 – 2022-03-17 (×2): 100 mg via INTRAVENOUS
  Filled 2022-03-09 (×8): qty 2

## 2022-03-09 MED ORDER — IPRATROPIUM-ALBUTEROL 0.5-2.5 (3) MG/3ML IN SOLN
3.0000 mL | RESPIRATORY_TRACT | Status: DC
  Administered 2022-03-09: 3 mL via RESPIRATORY_TRACT
  Filled 2022-03-09: qty 3

## 2022-03-09 MED ORDER — HEPARIN SODIUM (PORCINE) 5000 UNIT/ML IJ SOLN
5000.0000 [IU] | Freq: Three times a day (TID) | INTRAMUSCULAR | Status: DC
  Administered 2022-03-09 – 2022-03-10 (×2): 5000 [IU] via SUBCUTANEOUS
  Filled 2022-03-09 (×2): qty 1

## 2022-03-09 MED ORDER — LORAZEPAM 1 MG PO TABS: 1.0000 mg | ORAL_TABLET | ORAL | Status: DC | PRN

## 2022-03-09 MED ORDER — IPRATROPIUM BROMIDE 0.02 % IN SOLN
0.5000 mg | RESPIRATORY_TRACT | Status: DC
  Administered 2022-03-09 – 2022-03-11 (×12): 0.5 mg via RESPIRATORY_TRACT
  Filled 2022-03-09 (×14): qty 2.5

## 2022-03-09 MED ORDER — ADULT MULTIVITAMIN W/MINERALS CH: 1.0000 | ORAL_TABLET | Freq: Every day | ORAL | Status: DC

## 2022-03-09 MED ORDER — MAGNESIUM CITRATE PO SOLN: 1.0000 | Freq: Once | ORAL | Status: DC | PRN

## 2022-03-09 MED ORDER — HYDRALAZINE HCL 20 MG/ML IJ SOLN
5.0000 mg | Freq: Four times a day (QID) | INTRAMUSCULAR | Status: DC | PRN
  Administered 2022-03-10 – 2022-03-16 (×2): 5 mg via INTRAVENOUS
  Filled 2022-03-09 (×3): qty 1

## 2022-03-09 MED ORDER — METOPROLOL TARTRATE 5 MG/5ML IV SOLN
5.0000 mg | Freq: Three times a day (TID) | INTRAVENOUS | Status: DC
  Administered 2022-03-09 – 2022-03-10 (×2): 5 mg via INTRAVENOUS
  Filled 2022-03-09 (×2): qty 5

## 2022-03-09 MED ORDER — PANTOPRAZOLE SODIUM 40 MG PO TBEC
40.0000 mg | DELAYED_RELEASE_TABLET | Freq: Every day | ORAL | Status: DC
  Administered 2022-03-10: 40 mg via ORAL
  Filled 2022-03-09: qty 1

## 2022-03-09 MED ORDER — AMLODIPINE BESYLATE 5 MG PO TABS
10.0000 mg | ORAL_TABLET | Freq: Every day | ORAL | Status: DC
  Administered 2022-03-10: 10 mg via ORAL
  Filled 2022-03-09: qty 2

## 2022-03-09 MED ORDER — METHYLPREDNISOLONE SODIUM SUCC 125 MG IJ SOLR
60.0000 mg | Freq: Three times a day (TID) | INTRAMUSCULAR | Status: DC
Start: 2022-03-09 — End: 2022-03-10
  Administered 2022-03-09 – 2022-03-10 (×3): 60 mg via INTRAVENOUS
  Filled 2022-03-09 (×3): qty 2

## 2022-03-09 MED ORDER — ONDANSETRON HCL 4 MG/2ML IJ SOLN
4.0000 mg | Freq: Four times a day (QID) | INTRAMUSCULAR | Status: DC | PRN
  Administered 2022-03-10: 4 mg via INTRAVENOUS
  Filled 2022-03-09: qty 2

## 2022-03-09 MED ORDER — ALBUTEROL SULFATE (2.5 MG/3ML) 0.083% IN NEBU: 2.5000 mg | INHALATION_SOLUTION | RESPIRATORY_TRACT | Status: DC | PRN

## 2022-03-09 MED ORDER — OXYCODONE HCL 5 MG PO TABS: 5.0000 mg | ORAL_TABLET | ORAL | Status: DC | PRN

## 2022-03-09 MED ORDER — SORBITOL 70 % SOLN: 30.0000 mL | Freq: Every day | Status: DC | PRN

## 2022-03-09 MED ORDER — GUAIFENESIN ER 600 MG PO TB12
1200.0000 mg | ORAL_TABLET | Freq: Two times a day (BID) | ORAL | Status: DC
  Administered 2022-03-09 – 2022-03-23 (×26): 1200 mg via ORAL
  Filled 2022-03-09 (×27): qty 2

## 2022-03-09 MED ORDER — AZITHROMYCIN 500 MG IV SOLR
500.0000 mg | Freq: Once | INTRAVENOUS | Status: AC
  Administered 2022-03-09: 500 mg via INTRAVENOUS
  Filled 2022-03-09: qty 5

## 2022-03-09 MED ORDER — THIAMINE MONONITRATE 100 MG PO TABS
100.0000 mg | ORAL_TABLET | Freq: Every day | ORAL | Status: DC
  Administered 2022-03-09 – 2022-03-23 (×13): 100 mg via ORAL
  Filled 2022-03-09 (×14): qty 1

## 2022-03-09 MED ORDER — BUDESONIDE 0.5 MG/2ML IN SUSP
0.5000 mg | Freq: Two times a day (BID) | RESPIRATORY_TRACT | Status: DC
  Administered 2022-03-09 – 2022-03-22 (×20): 0.5 mg via RESPIRATORY_TRACT
  Filled 2022-03-09 (×27): qty 2

## 2022-03-09 MED ORDER — SODIUM CHLORIDE 0.9% FLUSH
3.0000 mL | Freq: Two times a day (BID) | INTRAVENOUS | Status: DC
  Administered 2022-03-10 – 2022-03-18 (×18): 3 mL via INTRAVENOUS

## 2022-03-09 MED ORDER — SODIUM CHLORIDE 0.9 % IV SOLN
2.0000 g | INTRAVENOUS | Status: AC
  Administered 2022-03-10 – 2022-03-14 (×5): 2 g via INTRAVENOUS
  Filled 2022-03-09 (×5): qty 20

## 2022-03-09 MED ORDER — LORAZEPAM 2 MG/ML IJ SOLN: 1.0000 mg | INTRAMUSCULAR | Status: DC | PRN

## 2022-03-09 MED ORDER — POLYETHYLENE GLYCOL 3350 17 G PO PACK: 17.0000 g | PACK | Freq: Every day | ORAL | Status: DC | PRN

## 2022-03-09 MED ORDER — ASPIRIN 81 MG PO CHEW
324.0000 mg | CHEWABLE_TABLET | Freq: Once | ORAL | Status: AC
  Administered 2022-03-09: 324 mg via ORAL
  Filled 2022-03-09: qty 4

## 2022-03-09 MED ORDER — ACETAMINOPHEN 650 MG RE SUPP: 650.0000 mg | Freq: Four times a day (QID) | RECTAL | Status: DC | PRN

## 2022-03-09 MED ORDER — LEVALBUTEROL HCL 0.63 MG/3ML IN NEBU
0.6300 mg | INHALATION_SOLUTION | RESPIRATORY_TRACT | Status: DC
  Administered 2022-03-09 – 2022-03-11 (×11): 0.63 mg via RESPIRATORY_TRACT
  Filled 2022-03-09 (×13): qty 3

## 2022-03-09 MED ORDER — SODIUM CHLORIDE 0.9 % IV BOLUS
1000.0000 mL | Freq: Once | INTRAVENOUS | Status: AC
  Administered 2022-03-09: 1000 mL via INTRAVENOUS

## 2022-03-09 MED ORDER — SODIUM CHLORIDE 0.9 % IV SOLN
500.0000 mg | INTRAVENOUS | Status: AC
  Administered 2022-03-10 – 2022-03-14 (×5): 500 mg via INTRAVENOUS
  Filled 2022-03-09 (×5): qty 5

## 2022-03-09 MED ORDER — NICOTINE 7 MG/24HR TD PT24
7.0000 mg | MEDICATED_PATCH | Freq: Every day | TRANSDERMAL | Status: DC
  Administered 2022-03-10 – 2022-03-23 (×14): 7 mg via TRANSDERMAL
  Filled 2022-03-09 (×17): qty 1

## 2022-03-09 MED ORDER — SODIUM CHLORIDE 0.9 % IV SOLN
2.0000 g | Freq: Once | INTRAVENOUS | Status: AC
  Administered 2022-03-09: 2 g via INTRAVENOUS
  Filled 2022-03-09: qty 20

## 2022-03-09 MED ORDER — ONDANSETRON HCL 4 MG PO TABS: 4.0000 mg | ORAL_TABLET | Freq: Four times a day (QID) | ORAL | Status: DC | PRN

## 2022-03-09 MED ORDER — SODIUM CHLORIDE 0.9 % IV SOLN: 1.0000 g | Freq: Once | INTRAVENOUS | Status: DC

## 2022-03-09 MED ORDER — ALBUTEROL SULFATE (2.5 MG/3ML) 0.083% IN NEBU
INHALATION_SOLUTION | RESPIRATORY_TRACT | Status: AC
  Administered 2022-03-09: 10 mg
  Filled 2022-03-09: qty 12

## 2022-03-09 MED ORDER — MECLIZINE HCL 25 MG PO TABS
25.0000 mg | ORAL_TABLET | Freq: Three times a day (TID) | ORAL | Status: DC | PRN
Start: 2022-03-09 — End: 2022-03-23

## 2022-03-09 MED ORDER — MORPHINE SULFATE (PF) 2 MG/ML IV SOLN: 1.0000 mg | INTRAVENOUS | Status: DC | PRN

## 2022-03-09 NOTE — Progress Notes (Signed)
Pt transported on BiPAP from 032 to 3E15 without any complications. RN at bedside, RT will monitor.

## 2022-03-09 NOTE — Progress Notes (Signed)
   03/09/22 1902  Assess: MEWS Score  Temp 98 F (36.7 C)  BP (!) 133/101  MAP (mmHg) 112  Pulse Rate (!) 109  ECG Heart Rate (!) 109  Resp (!) 26  Level of Consciousness Alert  SpO2 99 %  O2 Device Bi-PAP  FiO2 (%) 40 %  Assess: MEWS Score  MEWS Temp 0  MEWS Systolic 0  MEWS Pulse 1  MEWS RR 2  MEWS LOC 0  MEWS Score 3  MEWS Score Color Yellow  Assess: if the MEWS score is Yellow or Red  Were vital signs taken at a resting state? Yes  Focused Assessment Change from prior assessment (see assessment flowsheet) (previously red mews.)  Does the patient meet 2 or more of the SIRS criteria? Yes  Does the patient have a confirmed or suspected source of infection? Yes  Provider and Rapid Response Notified? No  MEWS guidelines implemented *See Row Information* Yes  Treat  MEWS Interventions Administered prn meds/treatments  Pain Scale 0-10  Pain Score 0  Take Vital Signs  Increase Vital Sign Frequency  Yellow: Q 2hr X 2 then Q 4hr X 2, if remains yellow, continue Q 4hrs  Escalate  MEWS: Escalate Yellow: discuss with charge nurse/RN and consider discussing with provider and RRT  Notify: Charge Nurse/RN  Name of Charge Nurse/RN Notified Clydie Braun RN  Date Charge Nurse/RN Notified 03/09/22  Time Charge Nurse/RN Notified 1936  Document  Patient Outcome Stabilized after interventions (previously red mews)  Progress note created (see row info) Yes  Assess: SIRS CRITERIA  SIRS Temperature  0  SIRS Pulse 1  SIRS Respirations  1  SIRS WBC 1  SIRS Score Sum  3   Previously red mews. Now yellow. Plan of care ongoing.

## 2022-03-09 NOTE — H&P (Signed)
History and Physical    Mark [name]:[id number] DOB: 19-Jun-1951 DOA: 03/09/2022  PCP: Cipriano Mile, NP  Patient coming from: Home  I have personally briefly reviewed patient's old medical records in [place]  Chief Complaint: Shortness of breath  HPI: Mark [name] is a 71 y.o. male with medical history significant of asthma, alcohol dependence, hypertension, BPH, allergic rhinitis presenting to the ED with a 4-day history of productive cough of brownish sputum, worsening shortness of breath, chest pain associated with coughing, some loose stools.  It was noted the ED PA note that EMS was called to patient's residence and on arrival patient was noted to be in bed unable to get up and severe acute respiratory distress with use of accessory muscles was given IM epinephrine, 2 g magnesium sulfate, 125 mg of Solu-Medrol, 3 nebulized breathing treatments placed on CPAP and brought to the ED.  It was noted upon presentation to the ED that patient's respiratory status was slowly improving was subsequently transitioned to BiPAP, placed on more scheduled nebs.  Patient denies any fevers, no chills, no abdominal pain, no constipation, no melena, no hematemesis, no hematochezia, no syncopal episodes, no lightheadedness.  ED Course: Patient seen in the ED maintained on BiPAP, blood cultures ordered and pending.  Urinalysis ordered however not done yet.  CT head done negative for any acute abnormalities.  Chest x-ray done concerning for extensive heterogeneous airspace opacity in the right upper lobe most consistent with infection.  VBG done with a pH of 7.27, PCO2 of 46, PO2 of 57, PCO2 of 22, bicarb of 21, O2 sats of 85%.  Lactic acid noted to be elevated at 7.6, first set of troponin 181, BNP of 07/12/2007.  Comprehensive metabolic profile done with a bicarb of 18, chloride of 94, glucose of 159, BUN of 46, creatinine of 2.86, magnesium of 3.4, albumin of 2.4, AST of 70, protein of 8.5 otherwise  was within normal limits.  Ammonia level of 35.  CBC done with a leukocytosis white count of 34.3 with a ANC of 29.8 otherwise was within normal limits.  SARS coronavirus 2 PCR negative, influenza A and B PCR negative.  Alcohol level pending.  Patient given IV Rocephin and IV azithromycin.  Hospitalist were called to admit the patient for further evaluation and management.  On presentation to the ED patient noted to be tachycardic with heart rate as high as 141, tachypneic with respiratory rate of 37, initial blood pressure 190/166, 94% on CPAP.  Review of Systems: As per HPI otherwise all other systems reviewed and are negative.  Past Medical History:  Diagnosis Date   Allergic rhinitis    Asthma    BPH (benign prostatic hyperplasia)    Carpal tunnel syndrome    Hypertension     History reviewed. No pertinent surgical history.  Social History  reports that he has been smoking cigarettes. He has been smoking an average of .1 packs per day. He has never used smokeless tobacco. He reports current alcohol use. He reports that he does not use drugs.  No Known Allergies  Family History  Problem Relation Age of Onset   Colon cancer Father    Mother deceased patient unsure what she died from and how old she was.  Father deceased from colon cancer, age unknown.  Prior to Admission medications   Medication Sig Start Date End Date Taking? Authorizing Provider  albuterol (PROVENTIL HFA;VENTOLIN HFA) 108 (90 Base) MCG/ACT inhaler Inhale 2 puffs into the lungs every  4 (four) hours as needed for wheezing or shortness of breath. 07/03/16   Horton, Barbette Hair, MD  amLODipine (NORVASC) 10 MG tablet Take 1 tablet (10 mg total) by mouth daily. 11/29/15   Delsa Grana, PA-C  azithromycin (ZITHROMAX Z-PAK) 250 MG tablet Take 500 mg daily x 1 day then 250 mg daily x 4 days 11/12/19   Nita Sells, MD  cetirizine (ZYRTEC ALLERGY) 10 MG tablet Take 1 tablet (10 mg total) by mouth daily. Patient not  taking: Reported on 11/10/2019 11/29/15   Delsa Grana, PA-C  hydrochlorothiazide (HYDRODIURIL) 12.5 MG tablet Take 1 tablet (12.5 mg total) by mouth daily. Patient not taking: Reported on 11/10/2019 11/29/15   Delsa Grana, PA-C  losartan (COZAAR) 25 MG tablet Take 25 mg by mouth daily. 07/06/19   [provider]  meclizine (ANTIVERT) 25 MG tablet Take 1 tablet (25 mg total) by mouth 3 (three) times daily as needed for dizziness. 07/03/16   Horton, Barbette Hair, MD  metoprolol tartrate (LOPRESSOR) 25 MG tablet Take 0.5 tablets (12.5 mg total) by mouth 2 (two) times daily. 11/12/19   Nita Sells, MD  Multiple Vitamin (MULTIVITAMIN WITH MINERALS) TABS tablet Take 1 tablet by mouth daily.    [provider]  omeprazole (PRILOSEC) 20 MG capsule Take 1 capsule (20 mg total) by mouth daily. 10/28/19   Tegeler, Gwenyth Allegra, MD  SYMBICORT 160-4.5 MCG/ACT inhaler Inhale 2 puffs into the lungs daily.  10/14/19   [provider]  traZODone (DESYREL) 100 MG tablet Take 1 tablet (100 mg total) by mouth at bedtime as needed for sleep. 11/12/19   Nita Sells, MD    Physical Exam: Vitals:   03/09/22 1758 03/09/22 1800 03/09/22 1803 03/09/22 1804  BP: (!) 144/84 (!) 144/84    Pulse: (!) 118 (!) 121 (!) 119   Resp:  (!) 26 (!) 27   Temp:    98.2 F (36.8 C)  TempSrc:    Oral  SpO2:  92% 94%   Weight:      Height:        Constitutional: Acute respiratory distress.  Use of accessory muscles of respiration.  On nonrebreather at time of exam. Vitals:   03/09/22 1758 03/09/22 1800 03/09/22 1803 03/09/22 1804  BP: (!) 144/84 (!) 144/84    Pulse: (!) 118 (!) 121 (!) 119   Resp:  (!) 26 (!) 27   Temp:    98.2 F (36.8 C)  TempSrc:    Oral  SpO2:  92% 94%   Weight:      Height:       Eyes: PERRL, lids and conjunctivae normal ENMT: Mucous membranes are moist. Posterior pharynx clear of any exudate or lesions.Normal dentition.  Neck: normal, supple, no masses, no  thyromegaly Respiratory: Patient with use of of accessory muscles of respiration, patient with poor air movement, diffuse wheezing. Cardiovascular: Tachycardia.  No murmurs rubs or gallops.  No lower extremity edema.  Abdomen: no tenderness, no masses palpated. No hepatosplenomegaly. Bowel sounds positive.  Musculoskeletal: no clubbing / cyanosis. No joint deformity upper and lower extremities. Good ROM, no contractures. Normal muscle tone.  Skin: no rashes, lesions, ulcers. No induration Neurologic: CN 2-12 grossly intact. Sensation intact, DTR normal. Strength 5/5 in all 4.  Psychiatric: Normal judgment and insight. Alert and oriented x 3. Normal mood.  Labs on Admission: I have personally reviewed following labs and imaging studies  CBC: Recent Labs  Lab 03/09/22 1400 03/09/22 1410  WBC 34.3*  --  NEUTROABS 29.8*  --   HGB 13.4 14.3  HCT 40.4 42.0  MCV 102.0*  --   PLT 368  --     Basic Metabolic Panel: Recent Labs  Lab 03/09/22 1400 03/09/22 1410  NA 136 133*  K 3.6 3.7  CL 94*  --   CO2 18*  --   GLUCOSE 159*  --   BUN 46*  --   CREATININE 2.86*  --   CALCIUM 9.3  --   MG 3.4*  --     GFR: Estimated Creatinine Clearance: 26.6 mL/min (A) (by C-G formula based on SCr of 2.86 mg/dL (H)).  Liver Function Tests: Recent Labs  Lab 03/09/22 1400  AST 70*  ALT 42  ALKPHOS 96  BILITOT 1.0  PROT 8.5*  ALBUMIN 2.4*    Urine analysis:    Component Value Date/Time   COLORURINE STRAW (A) 11/10/2019 2153   APPEARANCEUR CLEAR 11/10/2019 2153   LABSPEC 1.005 11/10/2019 2153   [place] 6.0 11/10/2019 2153   GLUCOSEU NEGATIVE 11/10/2019 2153   HGBUR NEGATIVE 11/10/2019 2153   BILIRUBINUR NEGATIVE 11/10/2019 2153   KETONESUR 5 (A) 11/10/2019 2153   PROTEINUR NEGATIVE 11/10/2019 2153   NITRITE NEGATIVE 11/10/2019 2153   LEUKOCYTESUR NEGATIVE 11/10/2019 2153    Radiological Exams on Admission: CT Head Wo Contrast  Result Date: 03/09/2022 CLINICAL DATA:  Head  trauma altered EXAM: CT HEAD WITHOUT CONTRAST TECHNIQUE: Contiguous axial images were obtained from the base of the skull through the vertex without intravenous contrast. RADIATION DOSE REDUCTION: This exam was performed according to the departmental dose-optimization program which includes automated exposure control, adjustment of the mA and/or kV according to patient size and/or use of iterative reconstruction technique. COMPARISON:  Brain MRI 11/10/2019, CT brain 11/10/2019 FINDINGS: Brain: No acute territorial infarction, hemorrhage or intracranial mass. Atrophy and chronic small vessel ischemic changes of the white matter. Stable ventricle size Vascular: No hyperdense vessels.  Carotid vascular calcification Skull: Normal. Negative for fracture or focal lesion. Sinuses/Orbits: No acute finding. Other: None IMPRESSION: 1. No CT evidence for acute intracranial abnormality. 2. Atrophy and chronic small vessel ischemic changes of the white matter. Electronically Signed   By: Donavan Foil M.D.   On: 03/09/2022 16:09   DG Chest Port 1 View  Result Date: 03/09/2022 CLINICAL DATA:  Respiratory distress EXAM: PORTABLE CHEST 1 VIEW COMPARISON:  11/10/2019 FINDINGS: The heart size and mediastinal contours are within normal limits. Extensive heterogeneous airspace opacity of the right upper lobe. The visualized skeletal structures are unremarkable. IMPRESSION: Extensive heterogeneous airspace opacity of the right upper lobe, most consistent with infection. Recommend follow-up radiographs in 6-8 weeks to ensure complete radiographic resolution and exclude underlying mass. Electronically Signed   By: Delanna Ahmadi M.D.   On: 03/09/2022 14:21    EKG: Independently reviewed.  Sinus tachycardia  Assessment/Plan Principal Problem:   Acute respiratory failure with hypoxia (HCC) Active Problems:   Sepsis due to pneumonia (HCC)   Asthma exacerbation   Transaminitis   Elevated troponin   EtOH dependence (HCC)    Tobacco abuse   PNA (pneumonia)   ARF (acute renal failure) (HCC)   Dehydration   Abnormal TSH   Chronic alcohol abuse   Severe persistent asthma with acute exacerbation    #1 acute respiratory failure with hypoxia secondary to right upper lobe pneumonia and acute asthma exacerbation. -Likely multifactorial secondary to right upper lobe pneumonia in the setting of an acute asthma exacerbation. -On admission patient was brought in  on CPAP, had received IM epinephrine, 2 g of mag sulfate, 125 mg of Solu-Medrol, 3 nebulizer treatments in the field. -Patient transition to BiPAP. -Chest x-ray done concerning for right upper lobe pneumonia. -Patient noted on admission to be tachypneic, tachycardic, noted to have a significant leukocytosis, concerning for sepsis. -Blood cultures obtained and pending. -Check urine cultures, UA. -Urine Legionella antigen pending.   -Sputum Gram stain and culture pending. -Check a urine pneumococcus antigen -Check MRSA PCR. -Lactic acid level elevated and trending down, will repeat lactic acid level in the AM. -Continue empiric IV Rocephin, IV azithromycin. -Placed on PPI, Claritin, Pulmicort, Atrovent and Xopenex nebs, IV Solu-Medrol 60 mg every 8 hours. -Continue BiPAP overnight and reassess in the AM. -Due to high risk of decompensation will consult to PCCM for further evaluation and management.  2.  Sepsis secondary to pneumonia, POA -Patient on admission met criteria for sepsis with tachycardia, tachypnea, significant leukocytosis, lactic acidosis, acute respiratory distress, chest x-ray consistent with a right upper lobe pneumonia. -Blood cultures ordered and pending. -Check a UA with cultures and sensitivities. -Lactic acid level elevated and trending down. -Urine Legionella antigen pending. -Sepsis protocol underway in the ED. -Check a urine pneumococcus antigen. -Continue empiric IV Rocephin, IV azithromycin.  3.  Elevated troponin -Likely demand  ischemia secondary to problem #1 and 2. -Continue to check serial troponins. -Check a 2D echo. -Continue aspirin. -Cardiology consultation pending per EDP. -Supportive care.  4.  Hypertension -Placed on IV Lopressor. -Resume home regimen Norvasc.  5.  History of alcohol use -Check a EtOH level. -Patient with no significant withdrawal noted on examination. -Patient started on the Ativan CIWA protocol which we will continue. -Continue thiamine, multivitamin, folic acid. -Alcohol cessation stressed to patient.  6.  Tobacco dependence -Tobacco cessation. -Nicotine patch.  7.  Transaminitis -Likely secondary to history of alcohol use. -Follow.  8.  Acute renal failure -Likely secondary to prerenal azotemia in the setting of HCTZ, ARB. -Check a UA with cultures and sensitivities. -Check a urine sodium, urine creatinine. -Check renal ultrasound. -IV fluids. -If no improvement with renal function in the next 24 hours we will consult with nephrology.  9.  Dehydration -IV fluids.  10.  Abnormal TSH -Check a free T4, free T3.  DVT prophylaxis: Heparin Code Status:  Full Family Communication:  Updated patient.  No family at bedside. Disposition Plan:   Patient is from:  Home  Anticipated DC to:  TBD  Anticipated DC date:  4 to 5 days  Anticipated DC barriers: Clinical improvement  Consults called: PCCM. Admission status:  Admit to progressive care floor.  Severity of Illness: The appropriate patient status for this patient is INPATIENT. Inpatient status is judged to be reasonable and necessary in order to provide the required intensity of service to ensure the patient's safety. The patient's presenting symptoms, physical exam findings, and initial radiographic and laboratory data in the context of their chronic comorbidities is felt to place them at high risk for further clinical deterioration. Furthermore, it is not anticipated that the patient will be medically stable for  discharge from the hospital within 2 midnights of admission.   * I certify that at the point of admission it is my clinical judgment that the patient will require inpatient hospital care spanning beyond 2 midnights from the point of admission due to high intensity of service, high risk for further deterioration and high frequency of surveillance required.*    Irine Seal MD [facility]  How to  contact the [facility] Attending or Consulting provider [place] or covering provider during after hours [place], for this patient?   Check the care team in [facility] and look for a) attending/consulting [facility] provider listed and b) the [facility] team listed Log into [email or web address] and use [place]'s universal password to access. If you do not have the password, please contact the hospital operator. Locate the [facility] provider you are looking for under [facility] and page to a number that you can be directly reached. If you still have difficulty reaching the provider, please page the [facility] (Director on Call) for the Hospitalists listed on amion for assistance.  03/09/2022, 6:57 PM

## 2022-03-09 NOTE — Progress Notes (Addendum)
Pt placed on bipap by RT due to increased WOB per MD. Pt tolerating well at this time, RN aware, RT will monitor.

## 2022-03-09 NOTE — Progress Notes (Signed)
Pt placed on BiPAP by RT per MD. Pt tolerating well at this time, MD at bedside, RN at bedside, RT will monitor.    03/09/22 1352  BiPAP/CPAP/SIPAP  $ Non-Invasive Ventilator  Non-Invasive Vent Subsequent;Non-Invasive Vent Set Up  $ Face Mask Large  Yes  BiPAP/CPAP/SIPAP Pt Type Adult  Mask Type Full face mask  Mask Size Large  Set Rate 24 breaths/min  Respiratory Rate 38 breaths/min  IPAP 10 cmH20  EPAP 6 cmH2O  Oxygen Percent 40 %  Minute Ventilation 29.9  Leak 3  Peak Inspiratory Pressure (PIP) 14  Tidal Volume (Vt) 790  BiPAP/CPAP/SIPAP BiPAP  Patient Home Equipment No  Auto Titrate No  Press High Alarm 25 cmH2O  Press Low Alarm 5 cmH2O  BiPAP/CPAP /SiPAP Vitals  Pulse Rate (!) 141  SpO2 96 %  Bilateral Breath Sounds Diminished;Expiratory wheezes  MEWS Score/Color  MEWS Score 3  MEWS Score Color Yellow

## 2022-03-09 NOTE — ED Triage Notes (Signed)
Bib ems from home; Called out for hemorrhage originally, some dark red/brown blood noted by ems, poss rectal bleed; pt in resp distress, diaphoretic, 2 duo nebs, 15 mg albuterol, 1 mg Atrovent;  on c pap on arrival to ED; also given  0.3 epi,  2g mag, 125 solumedrol; 20 ga LW, cpap on peep of 7.5; RR 40, HR 130, ekg unremarkable otherwise; sats ra 92%, 94% with CPAP; hx asthma, bp 160/88; endorses cough x 4 days; using inhaler at home w/o relief

## 2022-03-09 NOTE — ED Provider Notes (Signed)
  3:22 PM Patient signed out to me by previous ED physician. Pt is a 71 yo male presenting in respiratory distress from asthma exacerbation. Fevers, chills, sob, coughing x 4 days. Out of asthma meds.   Given mag, epi, solumedrol, duonebs. Placed on bipap. Labs signif for leukocytosis 34.3, aki, elevated trop thought to be from demand ischemia from sepsis versus resp distress. LA elevated 7.6. CXR demonstrates RUL pneumonia. Rocephin and azith given.     Physical Exam  BP (!) 165/140   Pulse 60   Temp (!) 97.5 F (36.4 C) (Axillary)   Resp (!) 36   Ht 6\' 2"  (1.88 m)   Wt 77.1 kg   SpO2 98%   BMI 21.82 kg/m   Physical Exam Vitals and nursing note reviewed.  Constitutional:      General: He is not in acute distress.    Appearance: Normal appearance.  HENT:     Head: Normocephalic and atraumatic.  Cardiovascular:     Rate and Rhythm: Normal rate.  Pulmonary:     Effort: Tachypnea present.     Breath sounds: Decreased air movement present.  Neurological:     Mental Status: He is alert and oriented to person, place, and time.     GCS: GCS eye subscore is 4. GCS verbal subscore is 5. GCS motor subscore is 6.     Procedures  .Critical Care  Performed by: , DO Authorized by: Franne Forts, DO   Critical care provider statement:    Critical care time (minutes):  30   Critical care was necessary to treat or prevent imminent or life-threatening deterioration of the following conditions:  Respiratory failure   Critical care was time spent personally by me on the following activities:  Development of treatment plan with patient or surrogate, discussions with consultants, evaluation of patient's response to treatment, examination of patient, ordering and review of laboratory studies, ordering and review of radiographic studies, ordering and performing treatments and interventions, pulse oximetry, re-evaluation of patient's condition and review of old charts   Care  discussed with: admitting provider     Care discussed with comment:  Cardiology and admitting   ED Course / MDM   Clinical Course as of 03/09/22 1745  Fri Mar 09, 2022  1621 3-4 beat runs of [facility] intermittently, NSVT [SG]    Clinical Course User Index [SG] [facility], DO   Medical Decision Making Amount and/or Complexity of Data Reviewed Labs: ordered. Radiology: ordered.  Risk OTC drugs. Prescription drug management. Decision regarding hospitalization.   Patient significantly improved after BIPAP. Currently taking BiPAP break. Resting in bed. Still diminished breath sounds with tachypnea. Will plan for Q4 hour duonebs. Rocephin and azitho given for RUL pneumonia and sepis. Trops likely secondary to demand ischemia. Dr. Sloan Leiter agrees to admit. Requests cards consult. I spoke with cards who agrees to be on consult for elevated trop and bnp.         Janee Morn P, DO 03/09/22 1749

## 2022-03-09 NOTE — ED Provider Notes (Signed)
[facility] EMER[facility]ENCY DEPARTMENT Provider Note   CSN: [id number] Arrival date & time: 03/09/22  1346     History  No chief complaint on file.   Mark [name] is a 71 y.o. male.  Patient as above with significant medical history as below, including asthma, etoh abuse, HTN  who presents to the ED with complaint of DIB. Per EMS they were called to the residence for "someone bleeding." On there arrival patient was noted in his bed, severe respiratory distress.  Patient was given IM epi, 2 g mag sulfate, 125 mg Solu-Medrol, 3 x nebulized breathing treatments; started on CPAP.  Upon patient's arrival to the ED respiratory status is improving.  He was transitioned to BiPAP.  He was given CAT and ipratropium. Resp improving. Pt reports feeling unwell the past 4 days, low grade fever.  Says he is not using his medications.  No chills.  Having some intermittent diarrhea.  No headaches.  No nausea or vomiting.  Has some mild chest pain.  No abdominal pain.  Denies recent alcohol use.  Denies history of intubation  Denies bleeding from any source.  EMS does report patient's home was in disarray, fecal matter noted around patient in bed and on the floor    Past Medical History:  Diagnosis Date   Allergic rhinitis    Asthma    BPH (benign prostatic hyperplasia)    Carpal tunnel syndrome    Hypertension     No past surgical history on file.   The history is provided by the patient and the EMS personnel. No language interpreter was used.       Home Medications Prior to Admission medications   Medication Sig Start Date End Date Taking? Authorizing Provider  albuterol (PROVENTIL HFA;VENTOLIN HFA) 108 (90 Base) MCG/ACT inhaler Inhale 2 puffs into the lungs every 4 (four) hours as needed for wheezing or shortness of breath. 07/03/16   Horton, Barbette Hair, MD  amLODipine (NORVASC) 10 MG tablet Take 1 tablet (10 mg total) by mouth daily. 11/29/15   Delsa Grana, PA-C   azithromycin (ZITHROMAX Z-PAK) 250 MG tablet Take 500 mg daily x 1 day then 250 mg daily x 4 days 11/12/19   Nita Sells, MD  cetirizine (ZYRTEC ALLERGY) 10 MG tablet Take 1 tablet (10 mg total) by mouth daily. Patient not taking: Reported on 11/10/2019 11/29/15   Delsa Grana, PA-C  hydrochlorothiazide (HYDRODIURIL) 12.5 MG tablet Take 1 tablet (12.5 mg total) by mouth daily. Patient not taking: Reported on 11/10/2019 11/29/15   Delsa Grana, PA-C  losartan (COZAAR) 25 MG tablet Take 25 mg by mouth daily. 07/06/19   [provider]  meclizine (ANTIVERT) 25 MG tablet Take 1 tablet (25 mg total) by mouth 3 (three) times daily as needed for dizziness. 07/03/16   Horton, Barbette Hair, MD  metoprolol tartrate (LOPRESSOR) 25 MG tablet Take 0.5 tablets (12.5 mg total) by mouth 2 (two) times daily. 11/12/19   Nita Sells, MD  Multiple Vitamin (MULTIVITAMIN WITH MINERALS) TABS tablet Take 1 tablet by mouth daily.    [provider]  omeprazole (PRILOSEC) 20 MG capsule Take 1 capsule (20 mg total) by mouth daily. 10/28/19   Tegeler, Gwenyth Allegra, MD  SYMBICORT 160-4.5 MCG/ACT inhaler Inhale 2 puffs into the lungs daily.  10/14/19   [provider]  traZODone (DESYREL) 100 MG tablet Take 1 tablet (100 mg total) by mouth at bedtime as needed for sleep. 11/12/19   Nita Sells, MD  Allergies    Patient has no known allergies.    Review of Systems   Review of Systems  Unable to perform ROS: Severe respiratory distress  Constitutional:  Positive for fatigue and fever.  Respiratory:  Positive for cough, chest tightness and shortness of breath.   Gastrointestinal:  Negative for abdominal pain, diarrhea and nausea.  Genitourinary:  Negative for difficulty urinating.  Musculoskeletal:  Positive for arthralgias.    Physical Exam Updated Vital Signs BP (!) 138/90   Pulse 62   Temp (!) 97.5 F (36.4 C) (Axillary)   Resp (!) 33   Ht 6\' 2"  (1.88 m)   Wt 77.1 kg    SpO2 99%   BMI 21.82 kg/m  Physical Exam Vitals and nursing note reviewed. Exam conducted with a chaperone present.  Constitutional:      General: He is in acute distress.     Appearance: He is well-developed. He is ill-appearing and diaphoretic.  HENT:     Head: Normocephalic and atraumatic. No raccoon eyes, Battle's sign, right periorbital erythema or left periorbital erythema.     Jaw: There is normal jaw occlusion.     Right Ear: External ear normal.     Left Ear: External ear normal.     Mouth/Throat:     Mouth: Mucous membranes are moist.  Eyes:     General:        Right eye: Discharge present.        Left eye: Discharge present.    Extraocular Movements: Extraocular movements intact.     Pupils: Pupils are equal, round, and reactive to light.     Comments: Conjunctiva does appear dark  Neck:     Trachea: Trachea normal.  Cardiovascular:     Rate and Rhythm: Normal rate and regular rhythm.     Pulses: Normal pulses.     Heart sounds: Normal heart sounds.  Pulmonary:     Effort: Tachypnea, accessory muscle usage and respiratory distress present.     Breath sounds: Decreased breath sounds and wheezing present.     Comments: Wheezing R > L  Abdominal:     General: Abdomen is flat.     Palpations: Abdomen is soft.     Tenderness: There is no abdominal tenderness. There is no guarding or rebound.  Musculoskeletal:        General: Normal range of motion.     Cervical back: Normal range of motion.     Right lower leg: No edema.     Left lower leg: No edema.  Skin:    General: Skin is warm.     Capillary Refill: Capillary refill takes less than 2 seconds.  Neurological:     Mental Status: He is alert and oriented to person, place, and time.     GCS: GCS eye subscore is 4. GCS verbal subscore is 5. GCS motor subscore is 6.  Psychiatric:        Mood and Affect: Mood normal.        Behavior: Behavior normal.     ED Results / Procedures / Treatments   Labs (all  labs ordered are listed, but only abnormal results are displayed) Labs Reviewed  CBC WITH DIFFERENTIAL/PLATELET - Abnormal; Notable for the following components:      Result Value   WBC 34.3 (*)    RBC 3.96 (*)    MCV 102.0 (*)    Neutro Abs 29.8 (*)    Monocytes Absolute 2.7 (*)  Basophils Absolute 0.3 (*)    All other components within normal limits  BRAIN NATRIURETIC PEPTIDE - Abnormal; Notable for the following components:   B Natriuretic Peptide 1,309.2 (*)    All other components within normal limits  COMPREHENSIVE METABOLIC PANEL - Abnormal; Notable for the following components:   Chloride 94 (*)    CO2 18 (*)    Glucose, Bld 159 (*)    BUN 46 (*)    Creatinine, Ser 2.86 (*)    Total Protein 8.5 (*)    Albumin 2.4 (*)    AST 70 (*)    GFR, Estimated 23 (*)    Anion gap 24 (*)    All other components within normal limits  PROTIME-INR - Abnormal; Notable for the following components:   Prothrombin Time 16.0 (*)    INR 1.3 (*)    All other components within normal limits  MAGNESIUM - Abnormal; Notable for the following components:   Magnesium 3.4 (*)    All other components within normal limits  TSH - Abnormal; Notable for the following components:   TSH 0.195 (*)    All other components within normal limits  LACTIC ACID, PLASMA - Abnormal; Notable for the following components:   Lactic Acid, Venous 7.6 (*)    All other components within normal limits  I-STAT VENOUS BLOOD GAS, ED - Abnormal; Notable for the following components:   pO2, Ven 57 (*)    Acid-base deficit 6.0 (*)    Sodium 133 (*)    Calcium, Ion 1.02 (*)    All other components within normal limits  TROPONIN I (HIGH SENSITIVITY) - Abnormal; Notable for the following components:   Troponin I (High Sensitivity) 181 (*)    All other components within normal limits  RESP PANEL BY RT-PCR (FLU A&B, COVID) ARPGX2  CULTURE, BLOOD (ROUTINE X 2)  CULTURE, BLOOD (ROUTINE X 2)  EXPECTORATED SPUTUM ASSESSMENT  W GRAM STAIN, RFLX TO RESP C  AMMONIA  LIPASE, BLOOD  CK  BLOOD GAS, VENOUS  ETHANOL  SALICYLATE LEVEL  ACETAMINOPHEN LEVEL  LACTIC ACID, PLASMA  URINALYSIS, ROUTINE W REFLEX MICROSCOPIC  LEGIONELLA PNEUMOPHILA SEROGP 1 UR AG  TYPE AND SCREEN  TROPONIN I (HIGH SENSITIVITY)    EKG EKG Interpretation  Date/Time:  Friday March 09 2022 14:07:35 EDT Ventricular Rate:  144 PR Interval:  163 QRS Duration: 68 QT Interval:  324 QTC Calculation: 488 R Axis:   12 Text Interpretation: Sinus tachycardia Ventricular tachycardia, unsustained LAE, consider biatrial enlargement Low voltage, extremity leads Repolarization abnormality, prob rate related Borderline prolonged QT interval Baseline wander in lead(s) I II III aVL aVF TECHNICALLY DIFFICULT Confirmed by Wynona Dove ([id number]) on 03/09/2022 4:21:14 PM  Radiology CT Head Wo Contrast  Result Date: 03/09/2022 CLINICAL DATA:  Head trauma altered EXAM: CT HEAD WITHOUT CONTRAST TECHNIQUE: Contiguous axial images were obtained from the base of the skull through the vertex without intravenous contrast. RADIATION DOSE REDUCTION: This exam was performed according to the departmental dose-optimization program which includes automated exposure control, adjustment of the mA and/or kV according to patient size and/or use of iterative reconstruction technique. COMPARISON:  Brain MRI 11/10/2019, CT brain 11/10/2019 FINDINGS: Brain: No acute territorial infarction, hemorrhage or intracranial mass. Atrophy and chronic small vessel ischemic changes of the white matter. Stable ventricle size Vascular: No hyperdense vessels.  Carotid vascular calcification Skull: Normal. Negative for fracture or focal lesion. Sinuses/Orbits: No acute finding. Other: None IMPRESSION: 1. No CT evidence for acute intracranial abnormality.  2. Atrophy and chronic small vessel ischemic changes of the white matter. Electronically Signed   By: Jasmine Pang M.D.   On: 03/09/2022 16:09   DG  Chest Port 1 View  Result Date: 03/09/2022 CLINICAL DATA:  Respiratory distress EXAM: PORTABLE CHEST 1 VIEW COMPARISON:  11/10/2019 FINDINGS: The heart size and mediastinal contours are within normal limits. Extensive heterogeneous airspace opacity of the right upper lobe. The visualized skeletal structures are unremarkable. IMPRESSION: Extensive heterogeneous airspace opacity of the right upper lobe, most consistent with infection. Recommend follow-up radiographs in 6-8 weeks to ensure complete radiographic resolution and exclude underlying mass. Electronically Signed   By: Jearld Lesch M.D.   On: 03/09/2022 14:21    Procedures .Critical Care  Performed by: Sloan Leiter, DO Authorized by: Sloan Leiter, DO   Critical care provider statement:    Critical care time (minutes):  77   Critical care time was exclusive of:  Separately billable procedures and treating other patients   Critical care was necessary to treat or prevent imminent or life-threatening deterioration of the following conditions:  Respiratory failure and sepsis   Critical care was time spent personally by me on the following activities:  Development of treatment plan with patient or surrogate, discussions with consultants, evaluation of patient's response to treatment, examination of patient, ordering and review of laboratory studies, ordering and review of radiographic studies, ordering and performing treatments and interventions, pulse oximetry, re-evaluation of patient's condition, review of old charts and obtaining history from patient or surrogate     Medications Ordered in ED Medications  azithromycin (ZITHROMAX) 500 mg in sodium chloride 0.9 % 250 mL IVPB (500 mg Intravenous New Bag/Given 03/09/22 1602)  lactated ringers infusion (has no administration in time range)  lactated ringers bolus 1,300 mL (has no administration in time range)  LORazepam (ATIVAN) tablet 1-4 mg (has no administration in time range)    Or   LORazepam (ATIVAN) injection 1-4 mg (has no administration in time range)  thiamine (VITAMIN B1) tablet 100 mg (has no administration in time range)    Or  thiamine (VITAMIN B1) injection 100 mg (has no administration in time range)  folic acid 1 mg in sodium chloride 0.9 % 50 mL IVPB (has no administration in time range)  aspirin chewable tablet 324 mg (has no administration in time range)  albuterol (PROVENTIL) (2.5 MG/3ML) 0.083% nebulizer solution (10 mg  Given 03/09/22 1418)  ipratropium (ATROVENT) 0.02 % nebulizer solution (1 mL  Given 03/09/22 1419)  sodium chloride 0.9 % bolus 1,000 mL (1,000 mLs Intravenous New Bag/Given 03/09/22 1506)  cefTRIAXone (ROCEPHIN) 2 g in sodium chloride 0.9 % 100 mL IVPB (0 g Intravenous Stopped 03/09/22 1603)    ED Course/ Medical Decision Making/ A&P Clinical Course as of 03/09/22 1631  Fri Mar 09, 2022  1621 3-4 beat runs of [facility] intermittently, NSVT [SG]    Clinical Course User Index [SG] Sloan Leiter, DO                           Medical Decision Making Amount and/or Complexity of Data Reviewed Labs: ordered. Radiology: ordered.  Risk Prescription drug management.   This patient presents to the ED with chief complaint(s) of dib with pertinent past medical history of asthma, med non-compliance which further complicates the presenting complaint. The complaint involves an extensive differential diagnosis and also carries with it a high risk of complications and morbidity.  In my evaluation of this patient's dyspnea my DDx includes, but is not limited to, pneumonia, pulmonary embolism, pneumothorax, pulmonary edema, metabolic acidosis, asthma, COPD, cardiac cause, anemia, anxiety, etc.   . Serious etiologies were considered.   The initial plan is to  >> on arrival patient transitioned to BiPAP, start continuous albuterol treatments, get ipratropium. He has already received solumedrol, epi, and mgso4   Additional history  obtained: Additional history obtained from EMS  Records reviewed previous admission documents and home meds, prior lab/simaging   Independent labs interpretation:  The following labs were independently interpreted:  WBC 34.3, LA is 7.6, Cr 2.82 (no baseline renal disease) Mg is 3.4 (given mgso4 by ems) trop 181 (favor demand ischemia from hypoxia/sepsis)  Independent visualization of imaging: - I independently visualized the following imaging with scope of interpretation limited to determining acute life threatening conditions related to emergency care: CXR, CTH, which revealed likely right sided PNA, CTH neg  Cardiac monitoring was reviewed and interpreted by myself which shows sinus tachy  Treatment and Reassessment: BiPAP, sepsis bolus, Rocephin azithromycin, continued albuterol treatment, Atrovent >> improving, appears to be stable on the BIPAP, minute ventilation stabilizing  Consultation: - Consulted or discussed management/test interpretation w/ external professional: n/a  Consideration for admission or further workup: Admission was considered   Pt with severe sepsis likely 2/2 PNA compounded by severe asthma exacerbation. He is on BIPAP  (no home oxygen use, has also not been using home meds for at least 4-5 days). He has elev trop favor demand inschemia in setting of the above. ECG with NSVT 3-4 beats. He has no ongoing chest pain, was complaining of chest tightness on arrival; favor 2/2 respiratory distress. Will give ASA, trend troponin. Started on rocephin/azithro for sepsis and sepsis bundle ordered. Pt with daily ETOH abuse, will start CIWA. He is persistently tachycardic, he has severe sepsis but may be some component of ETOH withdrawal provoking his tachycardia. Liver enzymes are not elevated. He also has AKI, possible 2/2 poor intake but favor more so likely end organ damage in setting of sepsis/respiratory failure. Continue IVF. BNP was elevated, continue volume resuscitation  in setting of severe sepsis, continue BIPAP. Would likely benefit from echo.  -There was concern by EMS that some of the feculent material in his residence was mixed with blood, recommended rectal exam to which pt refused. Hgb is stable, no thinners; this was noted during prior admission as well.    -Plan to admit for asthma exacerbation, PNA, severe sepsis, resp failure, AKI, trop elev.  >>>Respiratory status appears stable on BIPAP at this time.     Pt signed out to incoming EDP pending remainder of laboratory workup and eventual admission.                       Social Determinants of health: Counseled patient for approximately 3 minutes regarding smoking cessation. Discussed risks of smoking and how they applied and affected their visit here today. Patient not ready to quit at this time, however will follow up with their primary doctor when they are.   CPT code: [phone]: intermediate counseling for smoking cessation   Social History   Tobacco Use   Smoking status: Every Day    Packs/day: 0.10    Types: Cigarettes   Smokeless tobacco: Never  Substance Use Topics   Alcohol use: Yes    Comment: 2 beers daily   Drug use: No  Final Clinical Impression(s) / ED Diagnoses Final diagnoses:  Acute respiratory failure with hypoxia (HCC)  Severe persistent asthma with acute exacerbation  Severe sepsis (HCC)  Pneumonia of right lung due to infectious organism, unspecified part of lung  AKI (acute kidney injury) (HCC)  Chronic alcohol abuse  Elevated troponin    Rx / DC Orders ED Discharge Orders     None         Sloan Leiter, DO 03/09/22 1632

## 2022-03-09 NOTE — Progress Notes (Signed)
Pt taken off bipap and placed on 2L [place]. Pt tolerating well at this, RN aware, MD aware, RT will monitor.

## 2022-03-09 NOTE — Progress Notes (Signed)
Pt ciwa score was 6. Only Beads of sweating   no other s/s of withdrawal like agitation. Possible from being tachypneic. Spo2 at 100% on Bipap. Pt is drowsy but easily  arousable. Holding Ativan for now as per NP. Ongoing plan of care. BP 127/85   Pulse 90   Temp 98.5 F (36.9 C) (Axillary)   Resp (!) 26   Ht 6\' 2"  (1.88 m)   Wt 77.1 kg   SpO2 99%   BMI 21.82 kg/m

## 2022-03-09 NOTE — Consult Note (Signed)
NAME:  Mark [name], MRN:  [id number], DOB:  24-Nov-1950, LOS: 0 ADMISSION DATE:  03/09/2022, CONSULTATION DATE: 9/1 REFERRING MD: Janee Morn, REASON FOR CONSULT: Acute respiratory failure with hypoxia  History of Present Illness:  Mark [name] is an 71 y.o. male who presented to [facility] on 9/1 after EMS was called for dark red/brown stool per rectum, on arrival EMS found the patient in respiratory distress.  He has a pertinent past medical history of asthma, EtOH abuse, hypertension.  ED work-up notable for WBC 34.3, Lactate 7.6 > 4.6, Creatinine 2.86 (baseline 0.9-1),BNP 1.3K, troponin 181> 150.  CXR concerning for RUL PNU with acute asthma exacerbation. A code sepsis was called.  Blood cultures, urine cultures, respirate culture ordered.  Patient was started on IV Rocephin and IV azithromycin.  Per hospitalist notes patient hypoxic and emergency department.  VBG 7.27/46/57/22, patient was started on BiPAP.  Patient was admitted to the hospital service.  PCCM was consulted for acute respiratory failure with hypoxia.  Pertinent  Medical History   Past Medical History:  Diagnosis Date   Allergic rhinitis    Asthma    BPH (benign prostatic hyperplasia)    Carpal tunnel syndrome    Hypertension      Significant Hospital Events: Including procedures, antibiotic start and stop dates in addition to other pertinent events   9/1 presented [facility].  Blood cultures >, urine culture >, tracheal aspirate >, echo >, CT head negative.  Azithromycin and Rocephin >  Interim History / Subjective:  See above  LR at 150  Subjective exam limited due to BIPAP: Denies chest pain, denies SOB, states last drink was three days ago, denies blood per rectum or mouth  Objective   Blood pressure (!) 144/84, pulse (!) 119, temperature 98.2 F (36.8 C), temperature source Oral, resp. rate (!) 27, height 6\' 2"  (1.88 m), weight 77.1 kg, SpO2 94 %.    FiO2 (%):  [40 %] 40 %  No intake or  output data in the 24 hours ending 03/09/22 1908 Filed Weights   03/09/22 1358  Weight: 77.1 kg    Examination: General: In bed, NAD, appears comfortable, slight diaphoresis on forehead. HEENT: MM pink/moist, anicteric, atraumatic Neuro: RASS 0, PERRL 51mm, GCS 15 CV: S1S2, NSR, no m/r/g appreciated PULM:  distant wheezes, trachea midline, chest expansion symmetric GI: soft, bsx4 active, non-tender   Extremities: warm/dry, no pretibial edema, capillary refill less than 3 seconds  Skin:  no rashes or lesions noted  Labs/imaging WBC 34.3 BNP 1.3K, troponin 181> 150 Creatinine 2.86 (baseline 0.9-1), BUN 46 Lactate 7.6 > 4.6 Albumin 2.4 AST 70, INR 1.3 Anion gap 24, CO2 18 Lipase, ammonia, CK within normal limits Magnesium 3.4 Blood cultures pending Sputum culture pending Legionella pending Urine culture pending UDS pending ASA/ethanol pending COVID/flu negative TSH 0.19, free T3/T4 pending VBG 7.27/45/57/21  CXR: No pneumothorax, no obvious effusion, right upper lobe airspace opacity suspect infectious etiology, radiology recommends 6 to 8-week follow-up with radiologic imaging. Head CT: No acute intracranial abnormality Renal ultrasound pending Echo pending Twelve-lead: Sinus tachycardia, technically limited  Resolved Hospital Problem list     Assessment & Plan:  Severe sepsis secondary to suspected right upper lobe pneumonia Acute respiratory failure with hypoxia secondary to above Lactic acidosis, secondary to sepsis AGMA, suspect secondary to lactic acidosis Acute asthma exacerbation, suspect secondary to pneumonia Active smoker 06-27-1973 IVF given in ED. CXR concerning for RUL PNU, WBC 34.3. Lactate 7.6 > 4.6, Anion gap 24, CO2  18. Given IM Epi, 2G mag sulfate, 125 solumedrol in field -Goal MAP 65 or greater. Levophed ordered. Titrate medication to goal -continue LR at 150 overnight -On Ceftriaxone and azithromycin. Narrow as cultures result. Follow up cultures,  strep urine, legonella -Follow up ECHO -Follow up CXR per radiology in 6-8 weeks -Continue Bipap overnight. Goal SPO2 92-98% -Continue pulmicort, atrovent, xopenex nebs -Continue IV solumedrol 60mg  Q8h. -Trend lactate -Follow up UDS, ASA, ethanol level. -Check RVP -Recheck BMP now -trend fever/wbc curve. Patient on steroids. -No indications to transfer to ICU at this time. Ok to continue on progressive.  Troponin elevation, suspect demand BNP elevation HTN BNP 1.3K, troponin 181> 150.  No obvious ST changes on twelve-lead -Continue telemetry -Follow-up echo -Continue aspirin -Follow-up cardiology consult -Continue home Norvasc -As needed Lopressor  AKI Suspect prerenal. Creatinine 2.86 (baseline 0.9-1), BUN 46 -Ensure renal perfusion. Goal MAP 65 or greater. -Hold HCTZ -Avoid neprotoxic drugs as possible. -Strict I&O's -Follow up AM creatinine -Continue LR at 150 overnight -Stop morphine with AKI  HX ETOH abuse Reports last drink -Follow up ETOH level -Continue CIWA 8/29-30 -Continue thiamine, MV, folic acid  Abnormal TSH -Follow up T4/T3  Best Practice (right click and "Reselect all SmartList Selections" daily)   Diet/type: NPO w/ oral meds DVT prophylaxis: prophylactic heparin  GI prophylaxis: PPI Lines: N/A Foley:  N/A Code Status:  full code Last date of multidisciplinary goals of care discussion [Full scope 9/1]  Labs   CBC: Recent Labs  Lab 03/09/22 1400 03/09/22 1410  WBC 34.3*  --   NEUTROABS 29.8*  --   HGB 13.4 14.3  HCT 40.4 42.0  MCV 102.0*  --   PLT 368  --     Basic Metabolic Panel: Recent Labs  Lab 03/09/22 1400 03/09/22 1410  NA 136 133*  K 3.6 3.7  CL 94*  --   CO2 18*  --   GLUCOSE 159*  --   BUN 46*  --   CREATININE 2.86*  --   CALCIUM 9.3  --   MG 3.4*  --    GFR: Estimated Creatinine Clearance: 26.6 mL/min (A) (by C-G formula based on SCr of 2.86 mg/dL (H)). Recent Labs  Lab 03/09/22 1400 03/09/22 1404  03/09/22 1653  WBC 34.3*  --   --   LATICACIDVEN  --  7.6* 4.6*    Liver Function Tests: Recent Labs  Lab 03/09/22 1400  AST 70*  ALT 42  ALKPHOS 96  BILITOT 1.0  PROT 8.5*  ALBUMIN 2.4*   Recent Labs  Lab 03/09/22 1400  LIPASE 20   Recent Labs  Lab 03/09/22 1400  AMMONIA 35    ABG    Component Value Date/Time   HCO3 21.0 03/09/2022 1410   TCO2 22 03/09/2022 1410   ACIDBASEDEF 6.0 (H) 03/09/2022 1410   O2SAT 85 03/09/2022 1410     Coagulation Profile: Recent Labs  Lab 03/09/22 1400  INR 1.3*    Cardiac Enzymes: Recent Labs  Lab 03/09/22 1400  CKTOTAL 173    HbA1C: No results found for: "HGBA1C"  CBG: No results for input(s): "GLUCAP" in the last 168 hours.  Review of Systems:   ROS limited due to bipap   Past Medical History:  He,  has a past medical history of Allergic rhinitis, Asthma, BPH (benign prostatic hyperplasia), Carpal tunnel syndrome, and Hypertension.   Surgical History:  History reviewed. No pertinent surgical history.   Social History:   reports that he has been  smoking cigarettes. He has been smoking an average of .1 packs per day. He has never used smokeless tobacco. He reports current alcohol use. He reports that he does not use drugs.   Family History:  His family history includes Colon cancer in his father.   Allergies No Known Allergies   Home Medications  Prior to Admission medications   Medication Sig Start Date End Date Taking? Authorizing Provider  albuterol (PROVENTIL HFA;VENTOLIN HFA) 108 (90 Base) MCG/ACT inhaler Inhale 2 puffs into the lungs every 4 (four) hours as needed for wheezing or shortness of breath. 07/03/16   Horton, Barbette Hair, MD  amLODipine (NORVASC) 10 MG tablet Take 1 tablet (10 mg total) by mouth daily. 11/29/15   Delsa Grana, PA-C  azithromycin (ZITHROMAX Z-PAK) 250 MG tablet Take 500 mg daily x 1 day then 250 mg daily x 4 days 11/12/19   Nita Sells, MD  cetirizine (ZYRTEC ALLERGY)  10 MG tablet Take 1 tablet (10 mg total) by mouth daily. Patient not taking: Reported on 11/10/2019 11/29/15   Delsa Grana, PA-C  hydrochlorothiazide (HYDRODIURIL) 12.5 MG tablet Take 1 tablet (12.5 mg total) by mouth daily. Patient not taking: Reported on 11/10/2019 11/29/15   Delsa Grana, PA-C  losartan (COZAAR) 25 MG tablet Take 25 mg by mouth daily. 07/06/19   [provider]  meclizine (ANTIVERT) 25 MG tablet Take 1 tablet (25 mg total) by mouth 3 (three) times daily as needed for dizziness. 07/03/16   Horton, Barbette Hair, MD  metoprolol tartrate (LOPRESSOR) 25 MG tablet Take 0.5 tablets (12.5 mg total) by mouth 2 (two) times daily. 11/12/19   Nita Sells, MD  Multiple Vitamin (MULTIVITAMIN WITH MINERALS) TABS tablet Take 1 tablet by mouth daily.    [provider]  omeprazole (PRILOSEC) 20 MG capsule Take 1 capsule (20 mg total) by mouth daily. 10/28/19   Tegeler, Gwenyth Allegra, MD  SYMBICORT 160-4.5 MCG/ACT inhaler Inhale 2 puffs into the lungs daily.  10/14/19   [provider]  traZODone (DESYREL) 100 MG tablet Take 1 tablet (100 mg total) by mouth at bedtime as needed for sleep. 11/12/19   Nita Sells, MD     Critical care time: n/a    [employer] MSN, APRN, AGACNP-BC [facility]  03/09/2022 , 8:15 PM  Please see [email or web address] for pager details  If no response, please call [phone] After hours, please call Elink at [phone]

## 2022-03-09 NOTE — ED Notes (Signed)
ED TO INPATIENT HANDOFF REPORT  ED Nurse Name and Phone #: [phone]  S Name/Age/Gender Mark [name] 71 y.o. male Room/Bed: 032C/032C  Code Status   Code Status: Prior  Home/SNF/Other Home Patient oriented to: self, place, time, and situation Is this baseline? Yes   Triage Complete: Triage complete  Chief Complaint Acute respiratory failure with hypoxia (HCC) [J96.01]  Triage Note Bib ems from home; Called out for hemorrhage originally, some dark red/brown blood noted by ems, poss rectal bleed; pt in resp distress, diaphoretic, 2 duo nebs, 15 mg albuterol, 1 mg Atrovent;  on c pap on arrival to ED; also given  0.3 epi,  2g mag, 125 solumedrol; 20 ga LW, cpap on peep of 7.5; RR 40, HR 130, ekg unremarkable otherwise; sats ra 92%, 94% with CPAP; hx asthma, bp 160/88; endorses cough x 4 days; using inhaler at home w/o relief   Allergies No Known Allergies  Level of Care/Admitting Diagnosis ED Disposition     ED Disposition  Admit   Condition  --   Comment  Hospital Area: [facility] [[id number]]  Level of Care: Progressive [102]  Admit to Progressive based on following criteria: MULTISYSTEM THREATS such as stable sepsis, metabolic/electrolyte imbalance with or without encephalopathy that is responding to early treatment.  Admit to Progressive based on following criteria: RESPIRATORY PROBLEMS hypoxemic/hypercapnic respiratory failure that is responsive to NIPPV (BiPAP) or High Flow Nasal Cannula (6-80 lpm). Frequent assessment/intervention, no > Q2 hrs < Q4 hrs, to maintain oxygenation and pulmonary hygiene.  May admit patient to [facility] or [facility] if equivalent level of care is available:: No  Covid Evaluation: Confirmed COVID Negative  Diagnosis: Acute respiratory failure with hypoxia [facility]) [[id number]]  Admitting Physician: Rodolph Bong [[id number]]  Attending Physician: Rodolph Bong [[id number]]  Certification:: I certify this patient will need inpatient  services for at least 2 midnights  Estimated Length of Stay: 5          B Medical/Surgery History Past Medical History:  Diagnosis Date   Allergic rhinitis    Asthma    BPH (benign prostatic hyperplasia)    Carpal tunnel syndrome    Hypertension    History reviewed. No pertinent surgical history.   A IV Location/Drains/Wounds Patient Lines/Drains/Airways Status     Active Line/Drains/Airways     Name Placement date Placement time Site Days   Peripheral IV 03/09/22 18 G Right Antecubital 03/09/22  1402  Antecubital  less than 1   Peripheral IV 03/09/22 20 G Left Wrist 03/09/22  --  Wrist  less than 1            Intake/Output Last 24 hours No intake or output data in the 24 hours ending 03/09/22 1816  Labs/Imaging Results for orders placed or performed during the hospital encounter of 03/09/22 (from the past 48 hour(s))  CBC with Differential     Status: Abnormal   Collection Time: 03/09/22  2:00 PM  Result Value Ref Range   WBC 34.3 (H) 4.0 - 10.5 K/uL   RBC 3.96 (L) 4.22 - 5.81 MIL/uL   Hemoglobin 13.4 13.0 - 17.0 g/dL   HCT 58.5 27.7 - 82.4 %   MCV 102.0 (H) 80.0 - 100.0 fL   MCH 33.8 26.0 - 34.0 pg   MCHC 33.2 30.0 - 36.0 g/dL   RDW 23.5 36.1 - 44.3 %   Platelets 368 150 - 400 K/uL   nRBC 0.1 0.0 - 0.2 %   Neutrophils Relative %  87 %   Neutro Abs 29.8 (H) 1.7 - 7.7 K/uL   Lymphocytes Relative 4 %   Lymphs Abs 1.4 0.7 - 4.0 K/uL   Monocytes Relative 8 %   Monocytes Absolute 2.7 (H) 0.1 - 1.0 K/uL   Eosinophils Relative 0 %   Eosinophils Absolute 0.0 0.0 - 0.5 K/uL   Basophils Relative 1 %   Basophils Absolute 0.3 (H) 0.0 - 0.1 K/uL   nRBC 0 0 /100 WBC   Abs Immature Granulocytes 0.00 0.00 - 0.07 K/uL   Tear Drop Cells PRESENT    Polychromasia PRESENT     Comment: Performed at [facility], [address] [place], [place] [address]  Brain natriuretic peptide     Status: Abnormal   Collection Time: 03/09/22  2:00 PM  Result Value Ref Range    B Natriuretic Peptide 1,309.2 (H) 0.0 - 100.0 pg/mL    Comment: Performed at [facility], [address] [place], [place] [address]  Ammonia     Status: None   Collection Time: 03/09/22  2:00 PM  Result Value Ref Range   Ammonia 35 9 - 35 umol/L    Comment: Performed at [facility], [address] [place], [place] [address]  Troponin I (High Sensitivity)     Status: Abnormal   Collection Time: 03/09/22  2:00 PM  Result Value Ref Range   Troponin I (High Sensitivity) 181 (HH) <18 ng/L    Comment: CRITICAL RESULT CALLED TO, READ BACK BY AND VERIFIED WITH H.Luvena Wentling,RN 1504 03/09/22 CLARK,S (NOTE) Elevated high sensitivity troponin I (hsTnI) values and significant  changes across serial measurements may suggest ACS but many other  chronic and acute conditions are known to elevate hsTnI results.  Refer to the "Links" section for chest pain algorithms and additional  guidance. Performed at [facility], [address] [place], [place] [address]   Comprehensive metabolic panel     Status: Abnormal   Collection Time: 03/09/22  2:00 PM  Result Value Ref Range   Sodium 136 135 - 145 mmol/L   Potassium 3.6 3.5 - 5.1 mmol/L   Chloride 94 (L) 98 - 111 mmol/L   CO2 18 (L) 22 - 32 mmol/L   Glucose, Bld 159 (H) 70 - 99 mg/dL    Comment: Glucose reference range applies only to samples taken after fasting for at least 8 hours.   BUN 46 (H) 8 - 23 mg/dL   Creatinine, Ser 2.952.86 (H) 0.61 - 1.24 mg/dL   Calcium 9.3 8.9 - 62.110.3 mg/dL   Total Protein 8.5 (H) 6.5 - 8.1 g/dL   Albumin 2.4 (L) 3.5 - 5.0 g/dL   AST 70 (H) 15 - 41 U/L   ALT 42 0 - 44 U/L   Alkaline Phosphatase 96 38 - 126 U/L   Total Bilirubin 1.0 0.3 - 1.2 mg/dL   GFR, Estimated 23 (L) >60 mL/min    Comment: (NOTE) Calculated using the CKD-EPI Creatinine Equation (2021)    Anion gap 24 (H) 5 - 15    Comment: Electrolytes repeated to confirm. Performed at [facility], [address]  [place], [place] [address]   Protime-INR     Status: Abnormal   Collection Time: 03/09/22  2:00 PM  Result Value Ref Range   Prothrombin Time 16.0 (H) 11.4 - 15.2 seconds   INR 1.3 (H) 0.8 - 1.2    Comment: (NOTE) INR goal varies based on device  and disease states. Performed at [facility], [address], [place], [place] [address]   Lipase, blood     Status: None   Collection Time: 03/09/22  2:00 PM  Result Value Ref Range   Lipase 20 11 - 51 U/L    Comment: Performed at [facility], [address] [place], [place] [address]  Magnesium     Status: Abnormal   Collection Time: 03/09/22  2:00 PM  Result Value Ref Range   Magnesium 3.4 (H) 1.7 - 2.4 mg/dL    Comment: Performed at [facility], [address] [place], [place] [address]  Type and screen [facility]     Status: None   Collection Time: 03/09/22  2:00 PM  Result Value Ref Range   ABO/RH(D) O POS    Antibody Screen NEG    Sample Expiration      03/12/2022,2359 Performed at [facility], [address] [place], [place] [address]   CK     Status: None   Collection Time: 03/09/22  2:00 PM  Result Value Ref Range   Total CK 173 49 - 397 U/L    Comment: Performed at [facility], [address] [place], [place] [address]  Resp Panel by RT-PCR (Flu A&B, Covid) Anterior Nasal Swab     Status: None   Collection Time: 03/09/22  2:01 PM   Specimen: Anterior Nasal Swab  Result Value Ref Range   SARS Coronavirus 2 by RT PCR NEGATIVE NEGATIVE    Comment: (NOTE) SARS-CoV-2 target nucleic acids are NOT DETECTED.  The SARS-CoV-2 RNA is generally detectable in upper respiratory specimens during the acute phase of infection. The lowest concentration of SARS-CoV-2 viral copies this assay can detect is 138 copies/mL. A negative result does not preclude SARS-Cov-2 infection and should not be used as the sole basis for treatment or other patient management decisions. A negative result  may occur with  improper specimen collection/handling, submission of specimen other than nasopharyngeal swab, presence of viral mutation(s) within the areas targeted by this assay, and inadequate number of viral copies(<138 copies/mL). A negative result must be combined with clinical observations, patient history, and epidemiological information. The expected result is Negative.  Fact Sheet for Patients:  BloggerCourse.com  Fact Sheet for Healthcare Providers:  SeriousBroker.it  This test is no t yet approved or cleared by the [place] FDA and  has been authorized for detection and/or diagnosis of SARS-CoV-2 by FDA under an Emergency Use Authorization (EUA). This EUA will remain  in effect (meaning this test can be used) for the duration of the COVID-19 declaration under Section 564(b)(1) of the Act, 21 U.S.C.section 360bbb-3(b)(1), unless the authorization is terminated  or revoked sooner.       Influenza A by PCR NEGATIVE NEGATIVE   Influenza B by PCR NEGATIVE NEGATIVE    Comment: (NOTE) The Xpert Xpress SARS-CoV-2/FLU/RSV plus assay is intended as an aid in the diagnosis of influenza from Nasopharyngeal swab specimens and should not be used as a sole basis for treatment. Nasal washings and aspirates are unacceptable for Xpert Xpress SARS-CoV-2/FLU/RSV testing.  Fact Sheet for Patients: BloggerCourse.com  Fact Sheet for Healthcare Providers: SeriousBroker.it  This test is not yet approved or cleared by the [place] FDA and has been authorized for detection and/or diagnosis of SARS-CoV-2 by FDA under an Emergency Use Authorization (EUA). This EUA will remain in effect (meaning this test can be used)  for the duration of the COVID-19 declaration under Section 564(b)(1) of the Act, 21 U.S.C. section 360bbb-3(b)(1), unless the authorization is terminated  or revoked.  Performed at [facility], [address], [place], [place] [address]   TSH     Status: Abnormal   Collection Time: 03/09/22  2:02 PM  Result Value Ref Range   TSH 0.195 (L) 0.350 - 4.500 uIU/mL    Comment: Performed by a 3rd Generation assay with a functional sensitivity of <=0.01 uIU/mL. Performed at [facility], [address], [place], [place] [address]   Lactic acid, plasma     Status: Abnormal   Collection Time: 03/09/22  2:04 PM  Result Value Ref Range   Lactic Acid, Venous 7.6 (HH) 0.5 - 1.9 mmol/L    Comment: CRITICAL RESULT CALLED TO, READ BACK BY AND VERIFIED WITH H.Julita Ozbun,RN 1452 03/09/22 CLARK,S Performed at [facility], [address] [place], [place] [address]   I-Stat venous blood gas, ED     Status: Abnormal   Collection Time: 03/09/22  2:10 PM  Result Value Ref Range   pH, Ven 7.270 7.25 - 7.43   pCO2, Ven 45.7 44 - 60 mmHg   pO2, Ven 57 (H) 32 - 45 mmHg   Bicarbonate 21.0 20.0 - 28.0 mmol/L   TCO2 22 22 - 32 mmol/L   O2 Saturation 85 %   Acid-base deficit 6.0 (H) 0.0 - 2.0 mmol/L   Sodium 133 (L) 135 - 145 mmol/L   Potassium 3.7 3.5 - 5.1 mmol/L   Calcium, Ion 1.02 (L) 1.15 - 1.40 mmol/L   HCT 42.0 39.0 - 52.0 %   Hemoglobin 14.3 13.0 - 17.0 g/dL   Sample type VENOUS   Lactic acid, plasma     Status: Abnormal   Collection Time: 03/09/22  4:53 PM  Result Value Ref Range   Lactic Acid, Venous 4.6 (HH) 0.5 - 1.9 mmol/L    Comment: CRITICAL VALUE NOTED. VALUE IS CONSISTENT WITH PREVIOUSLY REPORTED/CALLED VALUE Performed at [facility], [address] [place], [place] [address]   Troponin I (High Sensitivity)     Status: Abnormal   Collection Time: 03/09/22  4:53 PM  Result Value Ref Range   Troponin I (High Sensitivity) 150 (HH) <18 ng/L    Comment: CRITICAL VALUE NOTED.  VALUE IS CONSISTENT WITH PREVIOUSLY REPORTED AND CALLED VALUE. (NOTE) Elevated high sensitivity troponin I (hsTnI) values and significant   changes across serial measurements may suggest ACS but many other  chronic and acute conditions are known to elevate hsTnI results.  Refer to the "Links" section for chest pain algorithms and additional  guidance. Performed at [facility], [address], [place], [place] [address]    CT Head Wo Contrast  Result Date: 03/09/2022 CLINICAL DATA:  Head trauma altered EXAM: CT HEAD WITHOUT CONTRAST TECHNIQUE: Contiguous axial images were obtained from the base of the skull through the vertex without intravenous contrast. RADIATION DOSE REDUCTION: This exam was performed according to the departmental dose-optimization program which includes automated exposure control, adjustment of the mA and/or kV according to patient size and/or use of iterative reconstruction technique. COMPARISON:  Brain MRI 11/10/2019, CT brain 11/10/2019 FINDINGS: Brain: No acute territorial infarction, hemorrhage or intracranial mass. Atrophy and chronic small vessel ischemic changes of the white matter. Stable ventricle size Vascular: No hyperdense vessels.  Carotid vascular calcification Skull: Normal. Negative for fracture or focal lesion. Sinuses/Orbits: No acute finding. Other:  None IMPRESSION: 1. No CT evidence for acute intracranial abnormality. 2. Atrophy and chronic small vessel ischemic changes of the white matter. Electronically Signed   By: Jasmine Pang M.D.   On: 03/09/2022 16:09   DG Chest Port 1 View  Result Date: 03/09/2022 CLINICAL DATA:  Respiratory distress EXAM: PORTABLE CHEST 1 VIEW COMPARISON:  11/10/2019 FINDINGS: The heart size and mediastinal contours are within normal limits. Extensive heterogeneous airspace opacity of the right upper lobe. The visualized skeletal structures are unremarkable. IMPRESSION: Extensive heterogeneous airspace opacity of the right upper lobe, most consistent with infection. Recommend follow-up radiographs in 6-8 weeks to ensure complete radiographic resolution and exclude  underlying mass. Electronically Signed   By: Jearld Lesch M.D.   On: 03/09/2022 14:21    Pending Labs Unresulted Labs (From admission, onward)     Start     Ordered   03/09/22 1803  Rapid urine drug screen (hospital performed)  ONCE - STAT,   STAT        03/09/22 1802   03/09/22 1735  Urine Culture  (Urine Culture)  Once,   R       Question:  Indication  Answer:  Sepsis   03/09/22 1734   03/09/22 1735  Urinalysis, Routine w reflex microscopic Urine, Catheterized  (Septic presentation on arrival (screening labs, nursing and treatment orders for obvious sepsis))  ONCE - URGENT,   URGENT        03/09/22 1734   03/09/22 1437  Legionella Pneumophila Serogp 1 Ur Ag  Once,   URGENT        03/09/22 1436   03/09/22 1436  Expectorated Sputum Assessment w Gram Stain, Rflx to Resp Cult  Once,   R        03/09/22 1436   03/09/22 1404  Blood culture (routine x 2)  BLOOD CULTURE X 2,   R (with STAT occurrences)      03/09/22 1403   03/09/22 1401  Salicylate level  Once,   STAT        03/09/22 1400   03/09/22 1401  Acetaminophen level  Once,   STAT        03/09/22 1400   03/09/22 1400  Blood gas, venous (at [facility] and [facility], not at [facility])  Once,   R        03/09/22 1359   03/09/22 1400  Ethanol  Once,   URGENT        03/09/22 1359            Vitals/Pain Today's Vitals   03/09/22 1758 03/09/22 1800 03/09/22 1803 03/09/22 1804  BP: (!) 144/84 (!) 144/84    Pulse: (!) 118 (!) 121 (!) 119   Resp:  (!) 26 (!) 27   Temp:    98.2 F (36.8 C)  TempSrc:    Oral  SpO2:  92% 94%   Weight:      Height:      PainSc:        Isolation Precautions No active isolations  Medications Medications  lactated ringers infusion (has no administration in time range)  LORazepam (ATIVAN) tablet 1-4 mg (has no administration in time range)    Or  LORazepam (ATIVAN) injection 1-4 mg (has no administration in time range)  thiamine (VITAMIN B1) tablet 100 mg (100 mg Oral Given 03/09/22 1755)    Or  thiamine  (VITAMIN B1) injection 100 mg ( Intravenous See Alternative 03/09/22 1755)  folic acid 1 mg in sodium chloride  0.9 % 50 mL IVPB (has no administration in time range)  ipratropium-albuterol (DUONEB) 0.5-2.5 (3) MG/3ML nebulizer solution 3 mL (3 mLs Nebulization Given by Other 03/09/22 1755)  albuterol (PROVENTIL) (2.5 MG/3ML) 0.083% nebulizer solution (10 mg  Given 03/09/22 1418)  ipratropium (ATROVENT) 0.02 % nebulizer solution (1 mL  Given 03/09/22 1419)  sodium chloride 0.9 % bolus 1,000 mL (0 mLs Intravenous Stopped 03/09/22 1741)  azithromycin (ZITHROMAX) 500 mg in sodium chloride 0.9 % 250 mL IVPB (0 mg Intravenous Stopped 03/09/22 1741)  cefTRIAXone (ROCEPHIN) 2 g in sodium chloride 0.9 % 100 mL IVPB (0 g Intravenous Stopped 03/09/22 1603)  lactated ringers bolus 1,300 mL (1,300 mLs Intravenous New Bag/Given 03/09/22 1740)  aspirin chewable tablet 324 mg (324 mg Oral Given 03/09/22 1754)    Mobility walks with device     Focused Assessments    R Recommendations: See Admitting Provider Note  Report given to:   Additional Notes:

## 2022-03-09 NOTE — Progress Notes (Signed)
Pt transported on BiPAP from 032 to CT and back with out any complications. RN at bedside, RT will monitor.

## 2022-03-09 NOTE — Consult Note (Signed)
Cardiology Consultation   Patient ID: Mark [name] MRN: [id number]; DOB: [birth year]  Admit date: 03/09/2022 Date of Consult: 03/09/2022  PCP:  Hillery Aldo, NP   [facility] Providers Cardiologist:  None        Patient Profile:   Mark [name] is a 71 y.o. male with a hx of asthma, tobacco use, alcohol use, hypertension who is being seen 03/09/2022 for the evaluation of elevated BNP at the request of Dr. Janee Morn.  History of Present Illness:   Mark [name] has no prior cardiac history. He is on bipap on my interview and can only respond briefly to questions. He denies any prior heart issues. He has not had any chest pain or pressure. Confirms breathing is improved compared to presentation.  Rest of history summarized from chart. Patient brought in by EMS. He was found to have severe respiratory distress. He received IM epinephrine x 2mg , 125 mg solumedrol, mag sulfate, and multiple breathing treatments. Started on CPAP by EMS. On arrival to ER, changed to BiPAP, placed on continuous albuterol.   Summary of labs: Cr 2.86, BUN 46 CO2 18 Albumin 2.4, Tprot 8.5 BNP 1309 hsTn 181 -> 150 Lactate 7.6 -> 4.6 WBC 34.3 TSH 0.195  CXR personally reviewed, long lung fields c/w chronic asthma/COPD. No pleural effusions. Focal infiltrate right upper lobe.  ECG with sinus tach, PVCs  Past Medical History:  Diagnosis Date   Allergic rhinitis    Asthma    BPH (benign prostatic hyperplasia)    Carpal tunnel syndrome    Hypertension     History reviewed. No pertinent surgical history.   Home Medications:  Prior to Admission medications   Medication Sig Start Date End Date Taking? Authorizing Provider  albuterol (PROVENTIL HFA;VENTOLIN HFA) 108 (90 Base) MCG/ACT inhaler Inhale 2 puffs into the lungs every 4 (four) hours as needed for wheezing or shortness of breath. 07/03/16   Horton, 07/05/16, MD  amLODipine (NORVASC) 10 MG tablet Take 1 tablet (10 mg total) by mouth  daily. 11/29/15   12/01/15, PA-C  azithromycin (ZITHROMAX Z-PAK) 250 MG tablet Take 500 mg daily x 1 day then 250 mg daily x 4 days 11/12/19   01/12/20, MD  cetirizine (ZYRTEC ALLERGY) 10 MG tablet Take 1 tablet (10 mg total) by mouth daily. Patient not taking: Reported on 11/10/2019 11/29/15   12/01/15, PA-C  hydrochlorothiazide (HYDRODIURIL) 12.5 MG tablet Take 1 tablet (12.5 mg total) by mouth daily. Patient not taking: Reported on 11/10/2019 11/29/15   12/01/15, PA-C  losartan (COZAAR) 25 MG tablet Take 25 mg by mouth daily. 07/06/19   [provider]  meclizine (ANTIVERT) 25 MG tablet Take 1 tablet (25 mg total) by mouth 3 (three) times daily as needed for dizziness. 07/03/16   Horton, 07/05/16, MD  metoprolol tartrate (LOPRESSOR) 25 MG tablet Take 0.5 tablets (12.5 mg total) by mouth 2 (two) times daily. 11/12/19   01/12/20, MD  Multiple Vitamin (MULTIVITAMIN WITH MINERALS) TABS tablet Take 1 tablet by mouth daily.    [provider]  omeprazole (PRILOSEC) 20 MG capsule Take 1 capsule (20 mg total) by mouth daily. 10/28/19   Tegeler, 10/30/19, MD  SYMBICORT 160-4.5 MCG/ACT inhaler Inhale 2 puffs into the lungs daily.  10/14/19   [provider]  traZODone (DESYREL) 100 MG tablet Take 1 tablet (100 mg total) by mouth at bedtime as needed for sleep. 11/12/19   01/12/20, MD    Inpatient Medications:  Scheduled Meds:  amLODipine  10 mg Oral Daily   budesonide (PULMICORT) nebulizer solution  0.5 mg Nebulization BID   guaiFENesin  1,200 mg Oral BID   heparin  5,000 Units Subcutaneous Q8H   ipratropium  0.5 mg Nebulization Q4H   levalbuterol  0.63 mg Nebulization Q4H   loratadine  10 mg Oral Daily   methylPREDNISolone (SOLU-MEDROL) injection  60 mg Intravenous Q8H   metoprolol tartrate  5 mg Intravenous Q8H   multivitamin with minerals  1 tablet Oral Daily   nicotine  7 mg Transdermal Daily   [START ON 03/10/2022] pantoprazole   40 mg Oral Q0600   sodium chloride flush  3 mL Intravenous Q12H   thiamine  100 mg Oral Daily   Or   thiamine  100 mg Intravenous Daily   Continuous Infusions:  sodium chloride 125 mL/hr at 03/09/22 1908   [START ON 03/10/2022] azithromycin     [START ON 03/10/2022] cefTRIAXone (ROCEPHIN)  IV     folic acid 1 mg in sodium chloride 0.9 % 50 mL IVPB     lactated ringers 150 mL/hr at 03/09/22 2006   PRN Meds: acetaminophen **OR** acetaminophen, albuterol, hydrALAZINE, LORazepam **OR** LORazepam, meclizine, ondansetron **OR** ondansetron (ZOFRAN) IV, oxyCODONE, polyethylene glycol, sorbitol, traZODone  Allergies:   No Known Allergies  Social History:   Social History   Socioeconomic History   Marital status: Widowed    Spouse name: Not on file   Number of children: Not on file   Years of education: Not on file   Highest education level: Not on file  Occupational History   Not on file  Tobacco Use   Smoking status: Every Day    Packs/day: 0.10    Types: Cigarettes   Smokeless tobacco: Never  Substance and Sexual Activity   Alcohol use: Yes    Comment: 2 beers daily   Drug use: No   Sexual activity: Not on file  Other Topics Concern   Not on file  Social History Narrative   Not on file   Social Determinants of Health   Financial Resource Strain: Not on file  Food Insecurity: Not on file  Transportation Needs: Not on file  Physical Activity: Not on file  Stress: Not on file  Social Connections: Not on file  Intimate Partner Violence: Not on file    Family History:   Unable to obtain at this time as on bipap  ROS:  Unable to obtain as patient on bipap  Physical Exam/Data:   Vitals:   03/09/22 1800 03/09/22 1803 03/09/22 1804 03/09/22 1902  BP: (!) 144/84   (!) 133/101  Pulse: (!) 121 (!) 119  (!) 109  Resp: (!) 26 (!) 27  (!) 26  Temp:   98.2 F (36.8 C) 98 F (36.7 C)  TempSrc:   Oral Axillary  SpO2: 92% 94%  99%  Weight:      Height:       No intake or  output data in the 24 hours ending 03/09/22 2018    03/09/2022    1:58 PM 11/20/2021   12:26 PM 11/10/2019    6:31 PM  Last 3 Weights  Weight (lbs) 169 lb 15.6 oz 169 lb 15.6 oz 170 lb  Weight (kg) 77.1 kg 77.1 kg 77.111 kg     Body mass index is 21.82 kg/m.  General:  Well nourished, well developed, responding to questions while on bipap Neck: no JVD appreciated Vascular: Distal pulses 2+ bilaterally Cardiac:  tachycardic, no murmurs appreciated but bipap sounds limit sensitivity Lungs:  bipap sounds, some air movement appreciated but cannot hear much over bipap Abd: soft, nontender, no hepatomegaly  Ext: no edema Musculoskeletal:  No deformities Skin: warm and dry  Neuro:  no focal abnormalities noted Psych:  Normal affect  EKG:  The EKG was personally reviewed and demonstrates:  sinus tachycardia with PVCs Telemetry:  Telemetry was personally reviewed and demonstrates:  sinus tachycardia with PVCs vs aberrancy  Relevant CV Studies: None  Laboratory Data:  High Sensitivity Troponin:   Recent Labs  Lab 03/09/22 1400 03/09/22 1653  TROPONINIHS 181* 150*     Chemistry Recent Labs  Lab 03/09/22 1400 03/09/22 1410  NA 136 133*  K 3.6 3.7  CL 94*  --   CO2 18*  --   GLUCOSE 159*  --   BUN 46*  --   CREATININE 2.86*  --   CALCIUM 9.3  --   MG 3.4*  --   GFRNONAA 23*  --   ANIONGAP 24*  --     Recent Labs  Lab 03/09/22 1400  PROT 8.5*  ALBUMIN 2.4*  AST 70*  ALT 42  ALKPHOS 96  BILITOT 1.0   Lipids No results for input(s): "CHOL", "TRIG", "HDL", "LABVLDL", "LDLCALC", "CHOLHDL" in the last 168 hours.  Hematology Recent Labs  Lab 03/09/22 1400 03/09/22 1410  WBC 34.3*  --   RBC 3.96*  --   HGB 13.4 14.3  HCT 40.4 42.0  MCV 102.0*  --   MCH 33.8  --   MCHC 33.2  --   RDW 12.6  --   PLT 368  --    Thyroid  Recent Labs  Lab 03/09/22 1402  TSH 0.195*    BNP Recent Labs  Lab 03/09/22 1400  BNP 1,309.2*    DDimer No results for input(s):  "DDIMER" in the last 168 hours.   Radiology/Studies:  CT Head Wo Contrast  Result Date: 03/09/2022 CLINICAL DATA:  Head trauma altered EXAM: CT HEAD WITHOUT CONTRAST TECHNIQUE: Contiguous axial images were obtained from the base of the skull through the vertex without intravenous contrast. RADIATION DOSE REDUCTION: This exam was performed according to the departmental dose-optimization program which includes automated exposure control, adjustment of the mA and/or kV according to patient size and/or use of iterative reconstruction technique. COMPARISON:  Brain MRI 11/10/2019, CT brain 11/10/2019 FINDINGS: Brain: No acute territorial infarction, hemorrhage or intracranial mass. Atrophy and chronic small vessel ischemic changes of the white matter. Stable ventricle size Vascular: No hyperdense vessels.  Carotid vascular calcification Skull: Normal. Negative for fracture or focal lesion. Sinuses/Orbits: No acute finding. Other: None IMPRESSION: 1. No CT evidence for acute intracranial abnormality. 2. Atrophy and chronic small vessel ischemic changes of the white matter. Electronically Signed   By: Jasmine Pang M.D.   On: 03/09/2022 16:09   DG Chest Port 1 View  Result Date: 03/09/2022 CLINICAL DATA:  Respiratory distress EXAM: PORTABLE CHEST 1 VIEW COMPARISON:  11/10/2019 FINDINGS: The heart size and mediastinal contours are within normal limits. Extensive heterogeneous airspace opacity of the right upper lobe. The visualized skeletal structures are unremarkable. IMPRESSION: Extensive heterogeneous airspace opacity of the right upper lobe, most consistent with infection. Recommend follow-up radiographs in 6-8 weeks to ensure complete radiographic resolution and exclude underlying mass. Electronically Signed   By: Jearld Lesch M.D.   On: 03/09/2022 14:21     Assessment and Plan:  Acute hypoxic respiratory failure Acute pneumonia of  right upper lobe Sepsis 2/2 pneumonia Acute kidney injury -management  per primary team  Elevated BNP Elevated troponin Ventricular ectopy -both BNP and troponin likely elevated 2/2 sepsis and acute kidney injury -no clinical signs of heart failure -received IM epinephrine, steroids, and multiple breathing treatments. This may be affecting heart rate and ventricular ectopy -echo ordered  Cardiology will follow up echo tomorrow. Has been started on beta blocker, would be cautious about this given concern for acute asthma exacerbation. If sinus tachycardia is main concern, this is likely 2/2 his acute illness.    Risk Assessment/Risk Scores:      For questions or updates, please contact [facility] Please consult [email or web address] for contact info under    Signed, Jodelle Red, MD  03/09/2022 8:18 PM

## 2022-03-09 NOTE — Progress Notes (Signed)
Elink following code sepsis °

## 2022-03-09 NOTE — Progress Notes (Signed)
Please note the consult note on 9/01 at 1908 should be billed with 45 minutes of critical care time.

## 2022-03-10 ENCOUNTER — Other Ambulatory Visit: Payer: Self-pay

## 2022-03-10 ENCOUNTER — Inpatient Hospital Stay (HOSPITAL_COMMUNITY): Payer: Medicare HMO

## 2022-03-10 ENCOUNTER — Other Ambulatory Visit (HOSPITAL_COMMUNITY): Payer: Medicare HMO

## 2022-03-10 ENCOUNTER — Inpatient Hospital Stay: Payer: Self-pay

## 2022-03-10 DIAGNOSIS — R6520 Severe sepsis without septic shock: Secondary | ICD-10-CM | POA: Diagnosis not present

## 2022-03-10 DIAGNOSIS — R7989 Other specified abnormal findings of blood chemistry: Secondary | ICD-10-CM | POA: Diagnosis not present

## 2022-03-10 DIAGNOSIS — J45901 Unspecified asthma with (acute) exacerbation: Secondary | ICD-10-CM | POA: Diagnosis not present

## 2022-03-10 DIAGNOSIS — I4891 Unspecified atrial fibrillation: Secondary | ICD-10-CM

## 2022-03-10 DIAGNOSIS — J9601 Acute respiratory failure with hypoxia: Secondary | ICD-10-CM | POA: Diagnosis not present

## 2022-03-10 DIAGNOSIS — A419 Sepsis, unspecified organism: Secondary | ICD-10-CM | POA: Diagnosis not present

## 2022-03-10 DIAGNOSIS — N179 Acute kidney failure, unspecified: Secondary | ICD-10-CM | POA: Diagnosis not present

## 2022-03-10 LAB — RESPIRATORY PANEL BY PCR

## 2022-03-10 LAB — COMPREHENSIVE METABOLIC PANEL
ALT: 54 U/L — ABNORMAL HIGH (ref 0–44)
AST: 125 U/L — ABNORMAL HIGH (ref 15–41)
Albumin: 1.9 g/dL — ABNORMAL LOW (ref 3.5–5.0)
Alkaline Phosphatase: 70 U/L (ref 38–126)
Anion gap: 11 (ref 5–15)
BUN: 39 mg/dL — ABNORMAL HIGH (ref 8–23)
CO2: 25 mmol/L (ref 22–32)
Calcium: 8.6 mg/dL — ABNORMAL LOW (ref 8.9–10.3)
Chloride: 102 mmol/L (ref 98–111)
Creatinine, Ser: 1.35 mg/dL — ABNORMAL HIGH (ref 0.61–1.24)
GFR, Estimated: 57 mL/min — ABNORMAL LOW (ref >60)
Glucose, Bld: 237 mg/dL — ABNORMAL HIGH (ref 70–99)
Potassium: 3.5 mmol/L (ref 3.5–5.1)
Sodium: 138 mmol/L (ref 135–145)
Total Bilirubin: 0.6 mg/dL (ref 0.3–1.2)
Total Protein: 6.8 g/dL (ref 6.5–8.1)

## 2022-03-10 LAB — CBC WITH DIFFERENTIAL/PLATELET
Abs Immature Granulocytes: 0 10*3/uL (ref 0.00–0.07)
Basophils Absolute: 0 10*3/uL (ref 0.0–0.1)
Basophils Relative: 0 %
Eosinophils Absolute: 0 10*3/uL (ref 0.0–0.5)
Eosinophils Relative: 0 %
HCT: 33.6 % — ABNORMAL LOW (ref 39.0–52.0)
Hemoglobin: 11.3 g/dL — ABNORMAL LOW (ref 13.0–17.0)
Lymphocytes Relative: 2 %
Lymphs Abs: 0.5 10*3/uL — ABNORMAL LOW (ref 0.7–4.0)
MCH: 33.5 pg (ref 26.0–34.0)
MCHC: 33.6 g/dL (ref 30.0–36.0)
MCV: 99.7 fL (ref 80.0–100.0)
Monocytes Absolute: 0.2 10*3/uL (ref 0.1–1.0)
Monocytes Relative: 1 %
Neutro Abs: 23.1 10*3/uL — ABNORMAL HIGH (ref 1.7–7.7)
Neutrophils Relative %: 97 %
Platelets: 275 10*3/uL (ref 150–400)
RBC: 3.37 MIL/uL — ABNORMAL LOW (ref 4.22–5.81)
RDW: 12.5 % (ref 11.5–15.5)
WBC: 23.8 10*3/uL — ABNORMAL HIGH (ref 4.0–10.5)
nRBC: 0 % (ref 0.0–0.2)
nRBC: 0 /100 WBC

## 2022-03-10 LAB — BASIC METABOLIC PANEL
Anion gap: 10 (ref 5–15)
BUN: 37 mg/dL — ABNORMAL HIGH (ref 8–23)
CO2: 22 mmol/L (ref 22–32)
Calcium: 8.5 mg/dL — ABNORMAL LOW (ref 8.9–10.3)
Chloride: 104 mmol/L (ref 98–111)
Creatinine, Ser: 1.15 mg/dL (ref 0.61–1.24)
GFR, Estimated: >60 mL/min (ref >60)
Glucose, Bld: 285 mg/dL — ABNORMAL HIGH (ref 70–99)
Potassium: 4 mmol/L (ref 3.5–5.1)
Sodium: 136 mmol/L (ref 135–145)

## 2022-03-10 LAB — T4, FREE: Free T4: 1.39 ng/dL — ABNORMAL HIGH (ref 0.61–1.12)

## 2022-03-10 LAB — BLOOD GAS, ARTERIAL
Acid-base deficit: 0.2 mmol/L (ref 0.0–2.0)
Bicarbonate: 25.4 mmol/L (ref 20.0–28.0)
Drawn by: 246861
O2 Saturation: 99.6 %
Patient temperature: 37
pCO2 arterial: 44 mmHg (ref 32–48)
pH, Arterial: 7.37 (ref 7.35–7.45)
pO2, Arterial: 100 mmHg (ref 83–108)

## 2022-03-10 LAB — MAGNESIUM
Magnesium: 3.2 mg/dL — ABNORMAL HIGH (ref 1.7–2.4)
Magnesium: 3.1 mg/dL — ABNORMAL HIGH (ref 1.7–2.4)

## 2022-03-10 LAB — CBC
HCT: 34 % — ABNORMAL LOW (ref 39.0–52.0)
Hemoglobin: 11.1 g/dL — ABNORMAL LOW (ref 13.0–17.0)
MCH: 33.4 pg (ref 26.0–34.0)
MCHC: 32.6 g/dL (ref 30.0–36.0)
MCV: 102.4 fL — ABNORMAL HIGH (ref 80.0–100.0)
Platelets: 291 10*3/uL (ref 150–400)
RBC: 3.32 MIL/uL — ABNORMAL LOW (ref 4.22–5.81)
RDW: 12.7 % (ref 11.5–15.5)
WBC: 22.2 10*3/uL — ABNORMAL HIGH (ref 4.0–10.5)
nRBC: 0.1 % (ref 0.0–0.2)

## 2022-03-10 LAB — PHOSPHORUS
Phosphorus: 4.3 mg/dL (ref 2.5–4.6)
Phosphorus: 3.1 mg/dL (ref 2.5–4.6)

## 2022-03-10 LAB — LACTIC ACID, PLASMA: Lactic Acid, Venous: 2.1 mmol/L (ref 0.5–1.9)

## 2022-03-10 LAB — MRSA NEXT GEN BY PCR, NASAL: MRSA by PCR Next Gen: NOT DETECTED

## 2022-03-10 LAB — GLUCOSE, CAPILLARY
Glucose-Capillary: 293 mg/dL — ABNORMAL HIGH (ref 70–99)
Glucose-Capillary: 273 mg/dL — ABNORMAL HIGH (ref 70–99)
Glucose-Capillary: 257 mg/dL — ABNORMAL HIGH (ref 70–99)

## 2022-03-10 LAB — TROPONIN I (HIGH SENSITIVITY): Troponin I (High Sensitivity): 118 ng/L (ref <18)

## 2022-03-10 LAB — HEPARIN LEVEL (UNFRACTIONATED): Heparin Unfractionated: 0.11 IU/mL — ABNORMAL LOW (ref 0.30–0.70)

## 2022-03-10 MED ORDER — IPRATROPIUM BROMIDE 0.02 % IN SOLN
0.5000 mg | RESPIRATORY_TRACT | Status: DC | PRN
  Administered 2022-03-12 – 2022-03-19 (×2): 0.5 mg via RESPIRATORY_TRACT
  Filled 2022-03-10 (×2): qty 2.5

## 2022-03-10 MED ORDER — INSULIN ASPART 100 UNIT/ML IJ SOLN
0.0000 [IU] | INTRAMUSCULAR | Status: DC
  Administered 2022-03-10 (×2): 8 [IU] via SUBCUTANEOUS
  Administered 2022-03-11: 5 [IU] via SUBCUTANEOUS
  Administered 2022-03-11 (×3): 3 [IU] via SUBCUTANEOUS
  Administered 2022-03-11: 8 [IU] via SUBCUTANEOUS
  Administered 2022-03-12: 2 [IU] via SUBCUTANEOUS
  Administered 2022-03-12: 3 [IU] via SUBCUTANEOUS
  Administered 2022-03-12: 2 [IU] via SUBCUTANEOUS
  Administered 2022-03-12: 3 [IU] via SUBCUTANEOUS
  Administered 2022-03-12: 5 [IU] via SUBCUTANEOUS
  Administered 2022-03-12 – 2022-03-13 (×2): 3 [IU] via SUBCUTANEOUS
  Administered 2022-03-13 (×2): 2 [IU] via SUBCUTANEOUS
  Administered 2022-03-13: 3 [IU] via SUBCUTANEOUS
  Administered 2022-03-13: 2 [IU] via SUBCUTANEOUS
  Administered 2022-03-13: 3 [IU] via SUBCUTANEOUS
  Administered 2022-03-14: 2 [IU] via SUBCUTANEOUS
  Administered 2022-03-14: 5 [IU] via SUBCUTANEOUS
  Administered 2022-03-14: 3 [IU] via SUBCUTANEOUS
  Administered 2022-03-14: 5 [IU] via SUBCUTANEOUS
  Administered 2022-03-15 (×5): 2 [IU] via SUBCUTANEOUS
  Administered 2022-03-15: 5 [IU] via SUBCUTANEOUS
  Administered 2022-03-16 (×5): 2 [IU] via SUBCUTANEOUS
  Administered 2022-03-17: 3 [IU] via SUBCUTANEOUS
  Administered 2022-03-17 (×3): 2 [IU] via SUBCUTANEOUS
  Administered 2022-03-17: 3 [IU] via SUBCUTANEOUS
  Administered 2022-03-18 (×3): 2 [IU] via SUBCUTANEOUS
  Administered 2022-03-18 (×2): 5 [IU] via SUBCUTANEOUS
  Administered 2022-03-19: 2 [IU] via SUBCUTANEOUS
  Administered 2022-03-19: 3 [IU] via SUBCUTANEOUS
  Administered 2022-03-19: 2 [IU] via SUBCUTANEOUS
  Administered 2022-03-19: 3 [IU] via SUBCUTANEOUS
  Administered 2022-03-20: 5 [IU] via SUBCUTANEOUS
  Administered 2022-03-20 (×2): 2 [IU] via SUBCUTANEOUS

## 2022-03-10 MED ORDER — ADULT MULTIVITAMIN W/MINERALS CH
1.0000 | ORAL_TABLET | Freq: Every day | ORAL | Status: DC
  Administered 2022-03-11 – 2022-03-23 (×13): 1 via ORAL
  Filled 2022-03-10 (×13): qty 1

## 2022-03-10 MED ORDER — CHLORDIAZEPOXIDE HCL 25 MG PO CAPS
25.0000 mg | ORAL_CAPSULE | Freq: Four times a day (QID) | ORAL | Status: DC
  Administered 2022-03-10 (×2): 25 mg via ORAL
  Filled 2022-03-10 (×2): qty 1

## 2022-03-10 MED ORDER — DILTIAZEM LOAD VIA INFUSION
10.0000 mg | Freq: Once | INTRAVENOUS | Status: AC
  Administered 2022-03-10: 10 mg via INTRAVENOUS
  Filled 2022-03-10: qty 10

## 2022-03-10 MED ORDER — CHLORHEXIDINE GLUCONATE CLOTH 2 % EX PADS
6.0000 | MEDICATED_PAD | Freq: Every day | CUTANEOUS | Status: DC
  Administered 2022-03-10 – 2022-03-21 (×10): 6 via TOPICAL

## 2022-03-10 MED ORDER — ADULT MULTIVITAMIN W/MINERALS CH
1.0000 | ORAL_TABLET | Freq: Every day | ORAL | Status: DC
  Administered 2022-03-10: 1 via ORAL
  Filled 2022-03-10 (×2): qty 1

## 2022-03-10 MED ORDER — LORAZEPAM 2 MG/ML IJ SOLN: 1.0000 mg | INTRAMUSCULAR | Status: AC | PRN

## 2022-03-10 MED ORDER — ORAL CARE MOUTH RINSE: 15.0000 mL | OROMUCOSAL | Status: DC | PRN

## 2022-03-10 MED ORDER — LORAZEPAM 2 MG/ML IJ SOLN
1.0000 mg | INTRAMUSCULAR | Status: AC | PRN
  Administered 2022-03-10: 2 mg via INTRAVENOUS
  Filled 2022-03-10: qty 1

## 2022-03-10 MED ORDER — HEPARIN (PORCINE) 25000 UT/250ML-% IV SOLN
1750.0000 [IU]/h | INTRAVENOUS | Status: DC
  Administered 2022-03-10 (×3): 1150 [IU]/h via INTRAVENOUS
  Administered 2022-03-11: 1500 [IU]/h via INTRAVENOUS
  Administered 2022-03-12: 1600 [IU]/h via INTRAVENOUS
  Filled 2022-03-10 (×3): qty 250

## 2022-03-10 MED ORDER — DILTIAZEM HCL-DEXTROSE 125-5 MG/125ML-% IV SOLN (PREMIX)
5.0000 mg/h | INTRAVENOUS | Status: DC
  Administered 2022-03-10: 5 mg/h via INTRAVENOUS
  Administered 2022-03-10: 7.5 mg/h via INTRAVENOUS
  Administered 2022-03-10: 10 mg/h via INTRAVENOUS
  Administered 2022-03-10: 15 mg/h via INTRAVENOUS
  Administered 2022-03-10: 10 mg/h via INTRAVENOUS
  Administered 2022-03-11 – 2022-03-12 (×4): 15 mg/h via INTRAVENOUS
  Filled 2022-03-10 (×7): qty 125

## 2022-03-10 MED ORDER — CHLORDIAZEPOXIDE HCL 25 MG PO CAPS: 25.0000 mg | ORAL_CAPSULE | Freq: Four times a day (QID) | ORAL | Status: DC | PRN

## 2022-03-10 MED ORDER — CHLORDIAZEPOXIDE HCL 25 MG PO CAPS: 25.0000 mg | ORAL_CAPSULE | Freq: Every day | ORAL | Status: DC

## 2022-03-10 MED ORDER — CHLORDIAZEPOXIDE HCL 25 MG PO CAPS: 25.0000 mg | ORAL_CAPSULE | Freq: Three times a day (TID) | ORAL | Status: DC

## 2022-03-10 MED ORDER — LORAZEPAM 2 MG/ML IJ SOLN: 0.0000 mg | Freq: Three times a day (TID) | INTRAMUSCULAR | Status: AC

## 2022-03-10 MED ORDER — LOPERAMIDE HCL 2 MG PO CAPS: 2.0000 mg | ORAL_CAPSULE | ORAL | Status: DC | PRN

## 2022-03-10 MED ORDER — LORAZEPAM 1 MG PO TABS: 1.0000 mg | ORAL_TABLET | ORAL | Status: AC | PRN

## 2022-03-10 MED ORDER — HEPARIN BOLUS VIA INFUSION
2000.0000 [IU] | Freq: Once | INTRAVENOUS | Status: AC
  Administered 2022-03-10: 2000 [IU] via INTRAVENOUS
  Filled 2022-03-10: qty 2000

## 2022-03-10 MED ORDER — THIAMINE MONONITRATE 100 MG PO TABS: 100.0000 mg | ORAL_TABLET | Freq: Every day | ORAL | Status: DC

## 2022-03-10 MED ORDER — SODIUM CHLORIDE 0.9% FLUSH: 10.0000 mL | INTRAVENOUS | Status: DC | PRN

## 2022-03-10 MED ORDER — HYDROXYZINE HCL 25 MG PO TABS: 25.0000 mg | ORAL_TABLET | Freq: Four times a day (QID) | ORAL | Status: DC | PRN

## 2022-03-10 MED ORDER — THIAMINE HCL 100 MG/ML IJ SOLN: 100.0000 mg | Freq: Every day | INTRAMUSCULAR | Status: DC

## 2022-03-10 MED ORDER — FOLIC ACID 1 MG PO TABS: 1.0000 mg | ORAL_TABLET | Freq: Every day | ORAL | Status: DC

## 2022-03-10 MED ORDER — ADULT MULTIVITAMIN W/MINERALS CH: 1.0000 | ORAL_TABLET | Freq: Every day | ORAL | Status: DC

## 2022-03-10 MED ORDER — METHYLPREDNISOLONE SODIUM SUCC 40 MG IJ SOLR
40.0000 mg | Freq: Every day | INTRAMUSCULAR | Status: DC
  Administered 2022-03-10 – 2022-03-12 (×3): 40 mg via INTRAVENOUS
  Filled 2022-03-10 (×3): qty 1

## 2022-03-10 MED ORDER — SODIUM CHLORIDE 0.9% FLUSH
10.0000 mL | Freq: Two times a day (BID) | INTRAVENOUS | Status: DC
  Administered 2022-03-10 – 2022-03-11 (×3): 10 mL
  Administered 2022-03-12: 30 mL
  Administered 2022-03-12 – 2022-03-17 (×9): 10 mL
  Administered 2022-03-18: 30 mL
  Administered 2022-03-18 – 2022-03-19 (×2): 10 mL

## 2022-03-10 MED ORDER — ONDANSETRON 4 MG PO TBDP: 4.0000 mg | ORAL_TABLET | Freq: Four times a day (QID) | ORAL | Status: AC | PRN

## 2022-03-10 MED ORDER — CHLORDIAZEPOXIDE HCL 25 MG PO CAPS: 25.0000 mg | ORAL_CAPSULE | ORAL | Status: DC

## 2022-03-10 MED ORDER — FUROSEMIDE 10 MG/ML IJ SOLN
100.0000 mg | Freq: Once | INTRAVENOUS | Status: AC
  Administered 2022-03-10: 100 mg via INTRAVENOUS
  Filled 2022-03-10: qty 10

## 2022-03-10 MED ORDER — METHYLPREDNISOLONE SODIUM SUCC 125 MG IJ SOLR
80.0000 mg | Freq: Once | INTRAMUSCULAR | Status: AC
  Administered 2022-03-10: 80 mg via INTRAVENOUS
  Filled 2022-03-10: qty 2

## 2022-03-10 MED ORDER — METHYLPREDNISOLONE SODIUM SUCC 125 MG IJ SOLR: 120.0000 mg | Freq: Two times a day (BID) | INTRAMUSCULAR | Status: DC

## 2022-03-10 MED ORDER — LORAZEPAM 2 MG/ML IJ SOLN: 0.0000 mg | INTRAMUSCULAR | Status: AC

## 2022-03-10 MED ORDER — FOLIC ACID 5 MG/ML IJ SOLN
1.0000 mg | Freq: Every day | INTRAMUSCULAR | Status: DC
  Administered 2022-03-10 – 2022-03-13 (×4): 1 mg via INTRAVENOUS
  Filled 2022-03-10 (×6): qty 0.2

## 2022-03-10 MED ORDER — LEVALBUTEROL HCL 0.63 MG/3ML IN NEBU
0.6300 mg | INHALATION_SOLUTION | RESPIRATORY_TRACT | Status: DC | PRN
Start: 2022-03-10 — End: 2022-03-23
  Administered 2022-03-10 – 2022-03-19 (×3): 0.63 mg via RESPIRATORY_TRACT
  Filled 2022-03-10 (×4): qty 3

## 2022-03-10 MED ORDER — ORAL CARE MOUTH RINSE
15.0000 mL | OROMUCOSAL | Status: DC
  Administered 2022-03-10 – 2022-03-18 (×27): 15 mL via OROMUCOSAL

## 2022-03-10 MED ORDER — POTASSIUM CHLORIDE 10 MEQ/100ML IV SOLN
10.0000 meq | INTRAVENOUS | Status: AC
  Administered 2022-03-10 (×4): 10 meq via INTRAVENOUS
  Filled 2022-03-10 (×4): qty 100

## 2022-03-10 MED ORDER — THIAMINE HCL 100 MG/ML IJ SOLN
100.0000 mg | Freq: Once | INTRAMUSCULAR | Status: AC
  Administered 2022-03-10: 100 mg via INTRAMUSCULAR
  Filled 2022-03-10: qty 2

## 2022-03-10 NOTE — Progress Notes (Addendum)
ANTICOAGULATION CONSULT NOTE - Initial Consult  Pharmacy Consult for heparin  Indication: atrial fibrillation  No Known Allergies  Patient Measurements: Height: 6\' 2"  (188 cm) Weight: 78.3 kg (172 lb 9.9 oz) IBW/kg (Calculated) : 82.2 Heparin Dosing Weight: 77kg  Vital Signs: Temp: 97.6 F (36.4 C) (09/02 0456) Temp Source: Axillary (09/02 0456) BP: 141/108 (09/02 0600) Pulse Rate: 105 (09/02 0818)  Labs: Recent Labs    03/09/22 1400 03/09/22 1410 03/09/22 1653 03/09/22 1927 03/09/22 2250 03/10/22 0258  HGB 13.4 14.3  --   --   --  11.3*  HCT 40.4 42.0  --   --   --  33.6*  PLT 368  --   --   --   --  275  LABPROT 16.0*  --   --   --   --   --   INR 1.3*  --   --   --   --   --   CREATININE 2.86*  --   --  2.04*  --  1.35*  CKTOTAL 173  --   --   --   --   --   TROPONINIHS 181*  --  150* 143* 118*  --     Estimated Creatinine Clearance: 57.2 mL/min (A) (by C-G formula based on SCr of 1.35 mg/dL (H)).   Medical History: Past Medical History:  Diagnosis Date   Allergic rhinitis    Asthma    BPH (benign prostatic hyperplasia)    Carpal tunnel syndrome    Hypertension     Medications:  Scheduled:   budesonide (PULMICORT) nebulizer solution  0.5 mg Nebulization BID   chlordiazePOXIDE  25 mg Oral QID   Followed by   05/10/22 ON 03/11/2022] chlordiazePOXIDE  25 mg Oral TID   Followed by   05/11/2022 ON 03/12/2022] chlordiazePOXIDE  25 mg Oral BH-qamhs   Followed by   05/12/2022 ON 03/13/2022] chlordiazePOXIDE  25 mg Oral Daily   diltiazem  10 mg Intravenous Once   guaiFENesin  1,200 mg Oral BID   heparin  5,000 Units Subcutaneous Q8H   ipratropium  0.5 mg Nebulization Q4H   levalbuterol  0.63 mg Nebulization Q4H   loratadine  10 mg Oral Daily   methylPREDNISolone (SOLU-MEDROL) injection  60 mg Intravenous Q8H   multivitamin with minerals  1 tablet Oral Daily   nicotine  7 mg Transdermal Daily   pantoprazole  40 mg Oral Q0600   sodium chloride flush  3 mL Intravenous  Q12H   thiamine  100 mg Oral Daily   Or   thiamine  100 mg Intravenous Daily   Infusions:   sodium chloride 125 mL/hr at 03/10/22 0942   azithromycin     cefTRIAXone (ROCEPHIN)  IV     diltiazem (CARDIZEM) infusion     folic acid 1 mg in sodium chloride 0.9 % 50 mL IVPB     lactated ringers 150 mL/hr at 03/10/22 0433   potassium chloride 10 mEq (03/10/22 1012)    Assessment: 71 YO male presenting 9/1 with sepsis due to pneumonia. Currently in Afib--no [facility] PTA. Troponin and BNP both elevated likely due to sepsis and AKI.   Hgb 14.3>>11.3 (heparin 5000 units TID started 9/1), will monitor. Platelets 275, stable. Will bolus with lower dose of ~25 units/kg given down-trend of Hgb.   Goal of Therapy:  Heparin level 0.3-0.7 units/ml Monitor platelets by anticoagulation protocol: Yes   Plan:  Give heparin 2000 unit bolus  Start heparin infusion at  1150 units/hr Check anti-Xa level in 8 hours and daily while on heparin Continue to monitor H&H and platelets   Cherylin Mylar, PharmD PGY1 Pharmacy Resident 9/2/202310:35 AM

## 2022-03-10 NOTE — Progress Notes (Addendum)
Rounding Note    Patient Name: Mark [name] Date of Encounter: 03/10/2022  [place] Cardiologist: None Dr. Harrell Gave  Subjective   Went in to afib RVR today. D/w Dr. Grandville Silos, will start dilt and heparin.   Inpatient Medications    Scheduled Meds:  budesonide (PULMICORT) nebulizer solution  0.5 mg Nebulization BID   chlordiazePOXIDE  25 mg Oral QID   Followed by   Derrill Memo ON 03/11/2022] chlordiazePOXIDE  25 mg Oral TID   Followed by   Derrill Memo ON 03/12/2022] chlordiazePOXIDE  25 mg Oral BH-qamhs   Followed by   Derrill Memo ON 03/13/2022] chlordiazePOXIDE  25 mg Oral Daily   guaiFENesin  1,200 mg Oral BID   heparin  5,000 Units Subcutaneous Q8H   ipratropium  0.5 mg Nebulization Q4H   levalbuterol  0.63 mg Nebulization Q4H   loratadine  10 mg Oral Daily   methylPREDNISolone (SOLU-MEDROL) injection  60 mg Intravenous Q8H   multivitamin with minerals  1 tablet Oral Daily   nicotine  7 mg Transdermal Daily   pantoprazole  40 mg Oral Q0600   sodium chloride flush  3 mL Intravenous Q12H   thiamine  100 mg Oral Daily   Or   thiamine  100 mg Intravenous Daily   Continuous Infusions:  sodium chloride 125 mL/hr at 03/10/22 0942   azithromycin     cefTRIAXone (ROCEPHIN)  IV     diltiazem (CARDIZEM) infusion 5 mg/hr ([id number])   folic acid 1 mg in sodium chloride 0.9 % 50 mL IVPB     lactated ringers 150 mL/hr at 03/10/22 0433   potassium chloride 10 mEq (03/10/22 1012)   PRN Meds: acetaminophen **OR** acetaminophen, albuterol, chlordiazePOXIDE, hydrALAZINE, hydrOXYzine, loperamide, meclizine, [DISCONTINUED] ondansetron **OR** ondansetron (ZOFRAN) IV, ondansetron, oxyCODONE, polyethylene glycol, sorbitol, traZODone   Vital Signs    Vitals:   03/10/22 0600 03/10/22 0814 03/10/22 0817 03/10/22 0818  BP: (!) 141/108     Pulse: 92   (!) 105  Resp:    (!) 25  Temp:      TempSrc:      SpO2:  98% 99% 98%  Weight:      Height:        Intake/Output Summary (Last  24 hours) at 03/10/2022 1038 Last data filed at 03/10/2022 0855 Gross per 24 hour  Intake 1459.24 ml  Output 725 ml  Net 734.24 ml      03/10/2022    4:56 AM 03/09/2022    1:58 PM 11/20/2021   12:26 PM  Last 3 Weights  Weight (lbs) 172 lb 9.9 oz 169 lb 15.6 oz 169 lb 15.6 oz  Weight (kg) 78.3 kg 77.1 kg 77.1 kg      Telemetry    Afib rvr - Personally Reviewed  ECG    Afib rvr pvcs/aberrancy - Personally Reviewed  Physical Exam   GEN: appears unwell but not in extremis Neck: No JVD Cardiac: irregular tachycardic, no murmurs, rubs, or gallops.  Respiratory: clear GI: Soft, nontender, non-distended  MS: No edema; No deformity. Neuro:  Nonfocal  Psych: Normal affect   Labs    High Sensitivity Troponin:   Recent Labs  Lab 03/09/22 1400 03/09/22 1653 03/09/22 1927 03/09/22 2250  TROPONINIHS 181* 150* 143* 118*     Chemistry Recent Labs  Lab 03/09/22 1400 03/09/22 1410 03/09/22 1927 03/10/22 0258  NA 136 133* 136 138  K 3.6 3.7 3.8 3.5  CL 94*  --  97* 102  CO2 18*  --  20* 25  GLUCOSE 159*  --  229* 237*  BUN 46*  --  47* 39*  CREATININE 2.86*  --  2.04* 1.35*  CALCIUM 9.3  --  8.6* 8.6*  MG 3.4*  --   --  3.2*  PROT 8.5*  --   --  6.8  ALBUMIN 2.4*  --   --  1.9*  AST 70*  --   --  125*  ALT 42  --   --  54*  ALKPHOS 96  --   --  70  BILITOT 1.0  --   --  0.6  GFRNONAA 23*  --  35* 57*  ANIONGAP 24*  --  19* 11    Lipids No results for input(s): "CHOL", "TRIG", "HDL", "LABVLDL", "LDLCALC", "CHOLHDL" in the last 168 hours.  Hematology Recent Labs  Lab 03/09/22 1400 03/09/22 1410 03/10/22 0258  WBC 34.3*  --  23.8*  RBC 3.96*  --  3.37*  HGB 13.4 14.3 11.3*  HCT 40.4 42.0 33.6*  MCV 102.0*  --  99.7  MCH 33.8  --  33.5  MCHC 33.2  --  33.6  RDW 12.6  --  12.5  PLT 368  --  275   Thyroid  Recent Labs  Lab 03/09/22 1402 03/10/22 0258  TSH 0.195*  --   FREET4  --  1.39*    BNP Recent Labs  Lab 03/09/22 1400  BNP 1,309.2*    DDimer No  results for input(s): "DDIMER" in the last 168 hours.   Radiology    CT Head Wo Contrast  Result Date: 03/09/2022 CLINICAL DATA:  Head trauma altered EXAM: CT HEAD WITHOUT CONTRAST TECHNIQUE: Contiguous axial images were obtained from the base of the skull through the vertex without intravenous contrast. RADIATION DOSE REDUCTION: This exam was performed according to the departmental dose-optimization program which includes automated exposure control, adjustment of the mA and/or kV according to patient size and/or use of iterative reconstruction technique. COMPARISON:  Brain MRI 11/10/2019, CT brain 11/10/2019 FINDINGS: Brain: No acute territorial infarction, hemorrhage or intracranial mass. Atrophy and chronic small vessel ischemic changes of the white matter. Stable ventricle size Vascular: No hyperdense vessels.  Carotid vascular calcification Skull: Normal. Negative for fracture or focal lesion. Sinuses/Orbits: No acute finding. Other: None IMPRESSION: 1. No CT evidence for acute intracranial abnormality. 2. Atrophy and chronic small vessel ischemic changes of the white matter. Electronically Signed   By: Jasmine Pang M.D.   On: 03/09/2022 16:09   DG Chest Port 1 View  Result Date: 03/09/2022 CLINICAL DATA:  Respiratory distress EXAM: PORTABLE CHEST 1 VIEW COMPARISON:  11/10/2019 FINDINGS: The heart size and mediastinal contours are within normal limits. Extensive heterogeneous airspace opacity of the right upper lobe. The visualized skeletal structures are unremarkable. IMPRESSION: Extensive heterogeneous airspace opacity of the right upper lobe, most consistent with infection. Recommend follow-up radiographs in 6-8 weeks to ensure complete radiographic resolution and exclude underlying mass. Electronically Signed   By: Jearld Lesch M.D.   On: 03/09/2022 14:21    Cardiac Studies   Echo pending  Patient Profile     71 y.o. male with a hx of asthma, tobacco use, alcohol use, hypertension who is  being seen 03/09/2022 for the evaluation of elevated BNP and now afib RVR.   Assessment & Plan    Principal Problem:   Acute respiratory failure with hypoxia (HCC) Active Problems:   Asthma exacerbation   Transaminitis   Sepsis due to pneumonia (HCC)  Elevated troponin   EtOH dependence (HCC)   Tobacco abuse   PNA (pneumonia)   ARF (acute renal failure) (HCC)   Dehydration   Abnormal TSH   Chronic alcohol abuse   Severe persistent asthma with acute exacerbation   AKI (acute kidney injury) (HCC)   Severe sepsis (HCC)   New onset a-fib (HCC)  Afib RVR - afib rvr started 9/2 at 8:51 am - start dilt and heparin - check echo when HR improves - BNP and trop may be demand related in setting of PNA . - thyroid abnormalities noted on labs (? Hyperthyroid) will get additional labs and avoid amiodarone in the short term.  Remainder per IM.      For questions or updates, please contact [facility] Please consult [email or web address] for contact info under        Signed, Parke Poisson, MD  03/10/2022, 10:38 AM

## 2022-03-10 NOTE — Progress Notes (Signed)
eLink Physician-Brief Progress Note Patient Name: Mark [name] DOB: 26-Jan-1951 MRN: [id number]   Date of Service  03/10/2022  HPI/Events of Note  Hyperglycemia in 200s On steroids  eICU Interventions  CBG q4 ordered  Moderate SSI     Intervention Category Minor Interventions: Routine modifications to care plan (e.g. PRN medications for pain, fever)  Mark [name] Mark [name] 03/10/2022, 7:56 PM

## 2022-03-10 NOTE — Progress Notes (Signed)
ANTICOAGULATION CONSULT NOTE - Initial Consult  Pharmacy Consult for heparin  Indication: atrial fibrillation  No Known Allergies  Patient Measurements: Height: 6\' 2"  (188 cm) Weight: 79.1 kg (174 lb 6.1 oz) IBW/kg (Calculated) : 82.2 Heparin Dosing Weight: 77kg  Vital Signs: Temp: 97.8 F (36.6 C) (09/02 2100) Temp Source: Axillary (09/02 2100) BP: 144/97 (09/02 2145) Pulse Rate: 93 (09/02 2145)  Labs: Recent Labs    03/09/22 1400 03/09/22 1410 03/09/22 1653 03/09/22 1927 03/09/22 2250 03/10/22 0258 03/10/22 1757 03/10/22 2108  HGB 13.4 14.3  --   --   --  11.3*  --   --   HCT 40.4 42.0  --   --   --  33.6*  --   --   PLT 368  --   --   --   --  275  --   --   LABPROT 16.0*  --   --   --   --   --   --   --   INR 1.3*  --   --   --   --   --   --   --   HEPARINUNFRC  --   --   --   --   --   --   --  0.11*  CREATININE 2.86*  --   --  2.04*  --  1.35* 1.15  --   CKTOTAL 173  --   --   --   --   --   --   --   TROPONINIHS 181*  --  150* 143* 118*  --   --   --      Estimated Creatinine Clearance: 67.8 mL/min (by C-G formula based on SCr of 1.15 mg/dL).   Medical History: Past Medical History:  Diagnosis Date   Allergic rhinitis    Asthma    BPH (benign prostatic hyperplasia)    Carpal tunnel syndrome    Hypertension     Medications:  Scheduled:   budesonide (PULMICORT) nebulizer solution  0.5 mg Nebulization BID   Chlorhexidine Gluconate Cloth  6 each Topical Daily   folic acid  1 mg Intravenous Daily   guaiFENesin  1,200 mg Oral BID   insulin aspart  0-15 Units Subcutaneous Q4H   ipratropium  0.5 mg Nebulization Q4H   levalbuterol  0.63 mg Nebulization Q4H   loratadine  10 mg Oral Daily   LORazepam  0-4 mg Intravenous Q4H   Followed by   2109 ON 03/12/2022] LORazepam  0-4 mg Intravenous Q8H   methylPREDNISolone (SOLU-MEDROL) injection  40 mg Intravenous Daily   [START ON 03/11/2022] multivitamin with minerals  1 tablet Oral Daily   nicotine  7 mg  Transdermal Daily   mouth rinse  15 mL Mouth Rinse 4 times per day   pantoprazole  40 mg Oral Q0600   sodium chloride flush  10-40 mL Intracatheter Q12H   sodium chloride flush  3 mL Intravenous Q12H   thiamine  100 mg Oral Daily   Or   thiamine  100 mg Intravenous Daily   Infusions:   sodium chloride Stopped (03/10/22 2032)   azithromycin Stopped (03/10/22 1510)   cefTRIAXone (ROCEPHIN)  IV Stopped (03/10/22 1405)   diltiazem (CARDIZEM) infusion 15 mg/hr (03/10/22 2153)   heparin 1,300 Units/hr (03/10/22 2153)    Assessment: 71 YO male presenting 9/1 with sepsis due to pneumonia. Currently in Afib--no [facility] PTA. Troponin and BNP both elevated likely due to sepsis and AKI.  Hgb 14.3>>11.3 (heparin 5000 units TID started 9/1), will monitor. Platelets 275, stable. Will bolus with lower dose of ~25 units/kg given down-trend of Hgb.   HL came back at 0.11, we will increase the rate and check a level in AM.  Goal of Therapy:  Heparin level 0.3-0.7 units/ml Monitor platelets by anticoagulation protocol: Yes   Plan:   Increase heparin to 1300 units/hr Check HL in AM  Ulyses Southward, PharmD, BCIDP, AAHIVP, CPP Infectious Disease Pharmacist 03/10/2022 9:55 PM

## 2022-03-10 NOTE — Evaluation (Signed)
Occupational Therapy Evaluation Patient Details Name: Mark [name] MRN: [id number] DOB: 1951-01-11 Today's Date: 03/10/2022   History of Present Illness 71 y/o male presented to ED on 03/09/22 for respiratory distress, chest pai, and diarrhea. CXR showed extensive airspace in R upper lobe consistent with infection. Admitted for sepsis 2/2 PNA. PMH: asthma, alcohol dependence, HTN, COPD   Clinical Impression   Pt admitted for concerns listed above. PTA pt reported that he was independent with all ADL's and functional mobility. At this time, pt's HR and O2 needs were the main limiting factors. Pt HR in afib, hitting up to 182 while sitting EOB. Functionally, pt is able to complete ADL's and functional mobility with min guard to min A for safety. Recommending HHOT at this time, however has pt medically clears up, he may progress to needing no OT. Acute OT will follow.       Recommendations for follow up therapy are one component of a multi-disciplinary discharge planning process, led by the attending physician.  Recommendations may be updated based on patient status, additional functional criteria and insurance authorization.   Follow Up Recommendations  Home health OT (pending progress)    Assistance Recommended at Discharge Set up Supervision/Assistance  Patient can return home with the following A little help with walking and/or transfers;A little help with bathing/dressing/bathroom;Assistance with cooking/housework;Help with stairs or ramp for entrance    Functional Status Assessment  Patient has had a recent decline in their functional status and demonstrates the ability to make significant improvements in function in a reasonable and predictable amount of time.  Equipment Recommendations  Tub/shower seat    Recommendations for Other Services       Precautions / Restrictions Precautions Precautions: Fall Precaution Comments: watch HR and O2 Restrictions Weight Bearing  Restrictions: No      Mobility Bed Mobility Overal bed mobility: Modified Independent                  Transfers Overall transfer level: Needs assistance Equipment used: 1 person hand held assist Transfers: Sit to/from Stand Sit to Stand: Min assist           General transfer comment: increased time to complete. Assist to boost up and steady. Attempted to take sidesteps but required modA for balance with HHAx1      Balance Overall balance assessment: Needs assistance Sitting-balance support: No upper extremity supported, Feet supported Sitting balance-Leahy Scale: Good     Standing balance support: Single extremity supported Standing balance-Leahy Scale: Fair Standing balance comment: static standing fair but dynamic poor                           ADL either performed or assessed with clinical judgement   ADL Overall ADL's : Needs assistance/impaired                                       General ADL Comments: Min guard to min A at this time, unable to fully assess due to HR limiting activity     Vision Baseline Vision/History: 0 No visual deficits Ability to See in Adequate Light: 0 Adequate Patient Visual Report: No change from baseline Vision Assessment?: No apparent visual deficits     Perception     Praxis      Pertinent Vitals/Pain Pain Assessment Pain Assessment: No/denies pain     Hand Dominance  Right   Extremity/Trunk Assessment Upper Extremity Assessment Upper Extremity Assessment: Overall WFL for tasks assessed   Lower Extremity Assessment Lower Extremity Assessment: Generalized weakness   Cervical / Trunk Assessment Cervical / Trunk Assessment: Kyphotic   Communication Communication Communication: No difficulties   Cognition Arousal/Alertness: Awake/alert Behavior During Therapy: WFL for tasks assessed/performed Overall Cognitive Status: Within Functional Limits for tasks assessed                                        General Comments  On 4L O2 [place] on arrival, spO2 as low as 82% after standing but slow increase back to 92%, HR variable from 120-182 throughout session    Exercises     Shoulder Instructions      Home Living Family/patient expects to be discharged to:: Private residence Living Arrangements: Alone Available Help at Discharge: Family Type of Home: Apartment Home Access: Level entry     Home Layout: One level     Bathroom Shower/Tub: Chief Strategy Officer: Standard     Home Equipment: Agricultural consultant (2 wheels);Cane - single point          Prior Functioning/Environment Prior Level of Function : Independent/Modified Independent                        OT Problem List: Decreased strength;Decreased activity tolerance;Impaired balance (sitting and/or standing);Decreased safety awareness;Cardiopulmonary status limiting activity      OT Treatment/Interventions: Self-care/ADL training;Therapeutic exercise;Energy conservation;DME and/or AE instruction;Therapeutic activities;Balance training;Patient/family education    OT Goals(Current goals can be found in the care plan section) Acute Rehab OT Goals Patient Stated Goal: To figure out what's going on OT Goal Formulation: With patient Time For Goal Achievement: 03/24/22 Potential to Achieve Goals: Good ADL Goals Pt Will Perform Grooming: Independently;standing Pt Will Perform Lower Body Bathing: Independently;sit to/from stand;sitting/lateral leans Pt Will Perform Lower Body Dressing: Independently;sitting/lateral leans;sit to/from stand Pt Will Transfer to Toilet: Independently;ambulating Pt Will Perform Toileting - Clothing Manipulation and hygiene: Independently;sitting/lateral leans;sit to/from stand  OT Frequency: Min 2X/week    Co-evaluation              AM-PAC OT "6 Clicks" Daily Activity     Outcome Measure Help from another person eating meals?: A  Little Help from another person taking care of personal grooming?: A Little Help from another person toileting, which includes using toliet, bedpan, or urinal?: A Little Help from another person bathing (including washing, rinsing, drying)?: A Little Help from another person to put on and taking off regular upper body clothing?: A Little Help from another person to put on and taking off regular lower body clothing?: A Little 6 Click Score: 18   End of Session Equipment Utilized During Treatment: Oxygen Nurse Communication: Mobility status  Activity Tolerance: Patient tolerated treatment well Patient left: in bed;with call bell/phone within reach;with nursing/sitter in room  OT Visit Diagnosis: Unsteadiness on feet (R26.81);Other abnormalities of gait and mobility (R26.89);Muscle weakness (generalized) (M62.81)                Time: 0867-6195 OT Time Calculation (min): 20 min Charges:  OT General Charges $OT Visit: 1 Visit OT Evaluation $OT Eval Moderate Complexity: 1 Mod  Trish Mage, OTR/L [facility] Acute Rehab  Mark [name] 03/10/2022, 12:47 PM

## 2022-03-10 NOTE — Progress Notes (Addendum)
eLink Physician-Brief Progress Note Patient Name: Mark [name] DOB: [birth year] MRN: [id number]   Date of Service  03/10/2022  HPI/Events of Note  Patient on BiPAP for respiratory distress. Now with nausea. BiPAP discontinued and zofran given  On camera check, appears comfortable on 4L O2. HR 110s. HDS  Last Hg 14>11. On heparin gtt. No overt signs in bleeding per RN  eICU Interventions  Ok to remain off BiPAP Check CBC>Hg 11, stable     Intervention Category Minor Interventions: Clinical assessment - ordering diagnostic tests  Samhitha Rosen Mechele Collin 03/10/2022, 9:25 PM

## 2022-03-10 NOTE — Consult Note (Signed)
NAME:  Mark [name], MRN:  [id number], DOB:  22-Oct-1950, LOS: 1 ADMISSION DATE:  03/09/2022, CONSULTATION DATE: 9/1 REFERRING MD: Janee Morn, REASON FOR CONSULT: Acute respiratory failure with hypoxia  History of Present Illness:  Mark [name] is an 71 y.o. male who presented to [facility] on 9/1 after EMS was called for dark red/brown stool per rectum, on arrival EMS found the patient in respiratory distress.  He has a pertinent past medical history of asthma, EtOH abuse, hypertension.  ED work-up notable for WBC 34.3, Lactate 7.6 > 4.6, Creatinine 2.86 (baseline 0.9-1),BNP 1.3K, troponin 181> 150.  CXR concerning for RUL PNU with acute asthma exacerbation. A code sepsis was called.  Blood cultures, urine cultures, respirate culture ordered.  Patient was started on IV Rocephin and IV azithromycin.  Per hospitalist notes patient hypoxic and emergency department.  VBG 7.27/46/57/22, patient was started on BiPAP.  Patient was admitted to the hospital service.  PCCM was consulted for acute respiratory failure with hypoxia.  Pertinent  Medical History   Past Medical History:  Diagnosis Date   Allergic rhinitis    Asthma    BPH (benign prostatic hyperplasia)    Carpal tunnel syndrome    Hypertension      Significant Hospital Events: Including procedures, antibiotic start and stop dates in addition to other pertinent events   9/1 presented [facility].  Treated as RUL pneumonia with sepsis. 9/2 remains short of breath on BiPAP.  Obtunded.  Interim History / Subjective:  Called back to reassess by Dr. Janee Morn, patient is back on BiPAP and respirations are more labile.  Objective   Blood pressure (!) 146/80, pulse (!) 117, temperature 97.6 F (36.4 C), temperature source Axillary, resp. rate (!) 26, height 6\' 2"  (1.88 m), weight 78.3 kg, SpO2 99 %.    FiO2 (%):  [28 %-40 %] 40 %   Intake/Output Summary (Last 24 hours) at 03/10/2022 1652 Last data filed at 03/10/2022  0855 Gross per 24 hour  Intake 1459.24 ml  Output 725 ml  Net 734.24 ml   Filed Weights   03/09/22 1358 03/10/22 0456  Weight: 77.1 kg 78.3 kg    Examination: General: In bed, NAD, appears comfortable, slight diaphoresis on forehead.  Increased respiratory effort HEENT: MM pink/moist, anicteric, atraumatic Neuro: Obtunded but nonfocal. CV: S1S2, NSR, no m/r/g appreciated, JVP elevated to angle of the jaw. PULM: BiPAP mask in situ with good seal.  Positive accessory muscle use.  Diffuse wheezing. GI: soft, bsx4 active, non-tender   Extremities: warm/dry, mild dependent edema., capillary refill less than 3 seconds  Skin:  no rashes or lesions noted   Ancillary tests personally reviewed  Initial chest x-ray shows marked hyperinflation with right upper lobe infiltrate. BNP 1309 Creatinine 1.35 Assessment & Plan:   Critically ill due to acute hypoxic respiratory failure requiring noninvasive ventilation due to a combination of acute decompensated heart failure (echo pending), right upper lobe pneumonia and decompensated COPD. -Continue BiPAP support for now, repeat ABG.  May require intubation if continues to worsen. -Initiate diuresis.  We will give 100 mg IV now.  May need repeat dosing this evening. -Follow-up electrolytes -Continue current antibiotics and corticosteroids for community-acquired pneumonia and COPD exacerbation -Continue continue current bronchodilators.  History of alcohol abuse but no signs of alcohol withdrawal at this time. -Continue CIWA and continue to provide symptom triggered benzodiazepine -Will stop Librium taper given present degree of somnolence.  AKI-improved with initial fluid resuscitation. -Continue follow with diuresis.  Best Practice (right click and "Reselect all SmartList Selections" daily)   Diet/type: NPO w/ oral meds DVT prophylaxis: prophylactic heparin  GI prophylaxis: PPI Lines: N/A Foley:  N/A Code Status:  full code Last  date of multidisciplinary goals of care discussion [Full scope 9/1]  CRITICAL CARE Performed by: Lynnell Catalan   Total critical care time: 40 minutes  Critical care time was exclusive of separately billable procedures and treating other patients.  Critical care was necessary to treat or prevent imminent or life-threatening deterioration.  Critical care was time spent personally by me on the following activities: development of treatment plan with patient and/or surrogate as well as nursing, discussions with consultants, evaluation of patient's response to treatment, examination of patient, obtaining history from patient or surrogate, ordering and performing treatments and interventions, ordering and review of laboratory studies, ordering and review of radiographic studies, pulse oximetry, re-evaluation of patient's condition and participation in multidisciplinary rounds.  Lynnell Catalan, MD [facility] ICU Physician [facility] Critical [facility]  Pager: [phone] Mobile: [phone] After hours: [phone].

## 2022-03-10 NOTE — Progress Notes (Signed)
Spoke with [facility] RN and Dr Janee Morn re PICC  placement.  [facility] Rn unable to contact family for consent.  Pt unable to sign consent due to medical condition.  Dr Janee Morn and Dr Drucie Ip orders to place PICC per medical necessity for critical drips and declining status.  Pt currently transferring to ICU.

## 2022-03-10 NOTE — Progress Notes (Signed)
Attempted to call son, no answer.  

## 2022-03-10 NOTE — Progress Notes (Signed)
SLP Cancellation Note  Patient Details Name: Mark [name] MRN: [id number] DOB: 10/15/1950   Cancelled treatment:       Reason Eval/Treat Not Completed: Per chart and RN, pt currently on Bipap. SLP to f/u for completion of swallow evaluation when pt is medically ready.     Avie Echevaria, MA, CCC-SLP Acute Rehabilitation Services Office Number: [phone]  [name] 03/10/2022, 8:27 AM

## 2022-03-10 NOTE — Progress Notes (Signed)
   03/10/22 0900  Assess: MEWS Score  Temp 98.5 F (36.9 C)  BP (!) 140/82  MAP (mmHg) 99  Pulse Rate (!) 132  ECG Heart Rate (!) 135  Resp (!) 28  Level of Consciousness Alert  SpO2 95 %  O2 Device Nasal Cannula  O2 Flow Rate (L/min) 3 L/min  Assess: MEWS Score  MEWS Temp 0  MEWS Systolic 0  MEWS Pulse 3  MEWS RR 2  MEWS LOC 0  MEWS Score 5  MEWS Score Color Red  Assess: if the MEWS score is Yellow or Red  Were vital signs taken at a resting state? Yes  Focused Assessment Change from prior assessment (see assessment flowsheet)  Does the patient meet 2 or more of the SIRS criteria? No  Does the patient have a confirmed or suspected source of infection? No  Provider and Rapid Response Notified? No (MD notified)  MEWS guidelines implemented *See Row Information* Yes  Notify: Charge Nurse/RN  Name of Charge Nurse/RN Notified Velia RN  Date Charge Nurse/RN Notified 03/10/22  Time Charge Nurse/RN Notified 0626  Notify: Provider  Provider Name/Title Ramiro Harvest  Date Provider Notified 03/10/22  Time Provider Notified [phone]  Method of Notification Page (secure chat)  Notification Reason Change in status;Other (Comment) (elevated HR and RR)  Provider response See new orders  Date of Provider Response 03/10/22  Time of Provider Response 0910  Assess: SIRS CRITERIA  SIRS Temperature  0  SIRS Pulse 1  SIRS Respirations  1  SIRS WBC 1  SIRS Score Sum  3   Patient denies any chest pain. EKG done; afib w/RVR. Started on Cardizem. Seen by Cardiology Dept.  Orders noted and carried out.

## 2022-03-10 NOTE — Evaluation (Signed)
Physical Therapy Evaluation Patient Details Name: Mark [name] MRN: [id number] DOB: [birth year] Today's Date: 03/10/2022  History of Present Illness  70 y/o male presented to ED on 03/09/22 for respiratory distress, chest pai, and diarrhea. CXR showed extensive airspace in R upper lobe consistent with infection. Admitted for sepsis 2/2 PNA. PMH: asthma, alcohol dependence, HTN, COPD  Clinical Impression  Patient admitted with the above. PTA, patient lives alone and independent. Patient presents with weakness, impaired balance, and decreased activity tolerance. Patient HR up to 182 with minimal activity and in Afib. On 4L O2 [place], spO2 dropped to 82% after standing so returned to sitting. Patient required minA for sit to stand and modA for attempt at sidesteps at EOB with HHAx1. Patient will benefit from skilled PT services during acute stay to address listed deficits. Recommend HHPT to maximize functional independence and safety.        Recommendations for follow up therapy are one component of a multi-disciplinary discharge planning process, led by the attending physician.  Recommendations may be updated based on patient status, additional functional criteria and insurance authorization.  Follow Up Recommendations Home health PT      Assistance Recommended at Discharge Frequent or constant Supervision/Assistance  Patient can return home with the following  A lot of help with walking and/or transfers;A little help with bathing/dressing/bathroom;Assistance with cooking/housework;Assist for transportation    Equipment Recommendations Rolling Annella Prowell (2 wheels)  Recommendations for Other Services       Functional Status Assessment Patient has had a recent decline in their functional status and demonstrates the ability to make significant improvements in function in a reasonable and predictable amount of time.     Precautions / Restrictions Precautions Precautions: Fall Precaution Comments:  watch HR and O2 Restrictions Weight Bearing Restrictions: No      Mobility  Bed Mobility Overal bed mobility: Modified Independent                  Transfers Overall transfer level: Needs assistance Equipment used: 1 person hand held assist Transfers: Sit to/from Stand Sit to Stand: Min assist           General transfer comment: increased time to complete. Assist to boost up and steady. Attempted to take sidesteps but required modA for balance with HHAx1    Ambulation/Gait               General Gait Details: deferred due to HR up to 182 with minimal activity and in Afib  Stairs            Wheelchair Mobility    Modified Rankin (Stroke Patients Only)       Balance Overall balance assessment: Needs assistance Sitting-balance support: No upper extremity supported, Feet supported Sitting balance-Leahy Scale: Good     Standing balance support: Single extremity supported Standing balance-Leahy Scale: Fair Standing balance comment: static standing fair but dynamic poor                             Pertinent Vitals/Pain Pain Assessment Pain Assessment: No/denies pain    Home Living Family/patient expects to be discharged to:: Private residence Living Arrangements: Alone Available Help at Discharge: Family Type of Home: Apartment Home Access: Level entry       Home Layout: One level Home Equipment: Agricultural consultant (2 wheels);Cane - single point      Prior Function Prior Level of Function : Independent/Modified Independent  Hand Dominance        Extremity/Trunk Assessment   Upper Extremity Assessment Upper Extremity Assessment: Defer to OT evaluation    Lower Extremity Assessment Lower Extremity Assessment: Generalized weakness    Cervical / Trunk Assessment Cervical / Trunk Assessment: Kyphotic  Communication   Communication: No difficulties  Cognition Arousal/Alertness:  Awake/alert Behavior During Therapy: WFL for tasks assessed/performed Overall Cognitive Status: Within Functional Limits for tasks assessed                                          General Comments General comments (skin integrity, edema, etc.): On 4L O2 [place] on arrival, spO2 as low as 82% after standing but slow increase back to 92%    Exercises     Assessment/Plan    PT Assessment Patient needs continued PT services  PT Problem List Decreased strength;Decreased activity tolerance;Decreased balance;Decreased mobility;Decreased safety awareness;Decreased knowledge of precautions;Decreased knowledge of use of DME;Cardiopulmonary status limiting activity       PT Treatment Interventions DME instruction;Gait training;Functional mobility training;Therapeutic exercise;Therapeutic activities;Balance training;Patient/family education    PT Goals (Current goals can be found in the Care Plan section)  Acute Rehab PT Goals Patient Stated Goal: to get better PT Goal Formulation: With patient Time For Goal Achievement: 03/24/22 Potential to Achieve Goals: Good    Frequency Min 3X/week     Co-evaluation               AM-PAC PT "6 Clicks" Mobility  Outcome Measure Help needed turning from your back to your side while in a flat bed without using bedrails?: None Help needed moving from lying on your back to sitting on the side of a flat bed without using bedrails?: None Help needed moving to and from a bed to a chair (including a wheelchair)?: A Little Help needed standing up from a chair using your arms (e.g., wheelchair or bedside chair)?: A Little Help needed to walk in hospital room?: A Lot Help needed climbing 3-5 steps with a railing? : Total 6 Click Score: 17    End of Session Equipment Utilized During Treatment: Oxygen Activity Tolerance: Patient limited by fatigue;Treatment limited secondary to medical complications (Comment) (Afib) Patient left: in  bed;with call bell/phone within reach Nurse Communication: Mobility status PT Visit Diagnosis: Unsteadiness on feet (R26.81);Muscle weakness (generalized) (M62.81);Difficulty in walking, not elsewhere classified (R26.2)    Time: 5027-7412 PT Time Calculation (min) (ACUTE ONLY): 20 min   Charges:   PT Evaluation $PT Eval Moderate Complexity: 1 Mod          Anay Rathe A. Dan Humphreys PT, DPT Acute Rehabilitation Services Office [phone]   Viviann Spare 03/10/2022, 12:15 PM

## 2022-03-10 NOTE — Progress Notes (Addendum)
Was called by RN as patient noted to be diaphoretic, tachycardic, tachypneic, increased work of breathing on 2 to 3 L nasal cannula.  Patient placed back on the BiPAP. Came and assessed patient, patient with complaints of not feeling too well. Physical exam General: Less diaphoretic, on BiPAP, some increased work of breathing. Respiration: Increased work of breathing, on BiPAP, diffuse wheezing. Cardiovascular: Tachycardic, irregularly irregular. GI: Abdomen is soft, nontender, nondistended, positive bowel sounds Extremities: No clubbing cyanosis or edema.  Assessment/plan 1.  Acute respiratory distress -Patient in acute respiratory distress, diaphoretic tachycardic tachypneic noted to go into A-fib this morning. -Patient noted with increased work of breathing, some diffuse wheezing. -Patient placed back on the BiPAP which we will continue for the rest of the day. -Also some concern of possible alcohol withdrawal as patient noted to be diaphoretic with elevated CIWA score. -Check ABG. -Continue current treatments of IV antibiotics, Pulmicort, nebulizer treatments, PPI, Claritin, IV steroids. -Give a dose of IV Solu-Medrol x1.  We will give a dose of nebulizer treatment x1. -PCCM following.  2.  Alcohol dependence/?  Alcohol withdrawal -Patient noted to be diaphoretic early on today, tachypneic, tachycardic. -Some improvement with diaphoresis. -Continue Librium detox protocol. -Placed on stepdown IV Ativan as needed CIWA protocol. -Assess and monitor may need to be transferred to the ICU and placed on a Precedex drip. -We will have PCCM reassess.   Time spent 40 minutes

## 2022-03-10 NOTE — Progress Notes (Addendum)
PROGRESS NOTE    Mark [name]  JSE:[id number] DOB: 14-Jul-1950 DOA: 03/09/2022 PCP: Cipriano Mile, NP    No chief complaint on file.   Brief Narrative:  Patient 71 year old gentleman history of asthma, alcohol dependence, hypertension, BPH, allergic rhinitis presented to the ED with 4-day history of productive cough brownish sputum, acute respiratory distress requiring BiPAP.  Patient noted on work-up to be septic with concern for right upper lobe pneumonia, patient in acute respiratory distress concern for acute asthma exacerbation.  Patient noted to go into atrial fibrillation with RVR this morning 03/10/2022.  PCCM cardiology following.   Assessment & Plan:   Principal Problem:   Acute respiratory failure with hypoxia (HCC) Active Problems:   Sepsis due to pneumonia ([place])   Asthma exacerbation   Transaminitis   Elevated troponin   EtOH dependence (HCC)   Tobacco abuse   PNA (pneumonia)   ARF (acute renal failure) (HCC)   Dehydration   Abnormal TSH   Chronic alcohol abuse   Severe persistent asthma with acute exacerbation   AKI (acute kidney injury) ([place])   Severe sepsis (HCC)   New onset a-fib (HCC)  #1 acute respiratory failure with hypoxia secondary to right upper lobe pneumonia and acute asthma exacerbation. -Likely multifactorial secondary to right upper lobe pneumonia in the setting of an acute asthma exacerbation.  -On admission patient was brought in on CPAP, had received IM epinephrine, 2 g of mag sulfate, 125 mg of Solu-Medrol, 3 nebulizer treatments in the field. -Patient transition to BiPAP. -Chest x-ray done concerning for right upper lobe pneumonia. -Patient noted on admission to be tachypneic, tachycardic, noted to have a significant leukocytosis, concerning for sepsis. -Blood cultures obtained and pending. -Urinalysis nitrite negative leukocytes negative. -Urine strep pneumococcus antigen negative. -MRSA PCR negative. -Urine Legionella antigen pending.    -Sputum Gram stain and culture pending. -Lactic acid level elevated and trending down. -Continue empiric IV Rocephin, IV azithromycin, Claritin, PPI, Pulmicort, Atrovent and Xopenex nebs, IV Solu-Medrol every 8 hours. -Tolerated BiPAP overnight, currently on 3 L normal saline however with some ongoing shortness of breath. -BiPAP as needed. -Due to high risk of decompensation PCCM consulted and are following.    2.  Sepsis secondary to pneumonia, POA -Patient on admission met criteria for sepsis with tachycardia, tachypnea, significant leukocytosis, lactic acidosis, acute respiratory distress, chest x-ray consistent with a right upper lobe pneumonia. -Blood cultures ordered and pending. -MRSA PCR negative. -Respiratory viral panel negative. -Urinalysis nitrite negative leukocytes negative. -Lactic acid elevated at 7.6 on admission>>> 4.6>>> 2.1. -Urine pneumococcus antigen negative. -Urine Legionella antigen pending. -Sepsis protocol underway in the ED. -Continue empiric IV Rocephin, IV azithromycin, Mucinex, neb treatments.   -PCCM following and I appreciate their input and recommendations.    3.  New onset atrial fibrillation -Patient noted to go into A-fib with RVR with heart rate sustaining greater than the 140s as high as 182. -Likely secondary to sepsis, acute pulmonary issues.  Patient also noted to have an abnormal TSH. -Check a EKG. -Cardiac enzymes cycled were elevated but trending down (demand ischemia possibly). -2D echo pending. -TSH abnormal.  Elevated free T4. -Due to abnormal TSH we will likely stay away from amiodarone. -Discontinue IV Lopressor. -Cardizem 10 mg IV push x1, placed on Cardizem drip. -Start heparin for anticoagulation. -Cardiology following and appreciate input and recommendations.   4.  Elevated troponin -Likely demand ischemia secondary to problem #1 and 2. -Serial troponins trending down.   -2D echo pending.   -Continue aspirin.   -  Patient now  in A-fib with RVR and as such we will discontinue beta-blocker and place patient on a Cardizem drip.   -Cardiology consulted and following and appreciate input and recommendations.  -Supportive care.   5.  Hypertension -Patient currently in A-fib and as such we will discontinue IV Lopressor, discontinue Norvasc as patient being placed on a Cardizem drip.    6.  History of alcohol use -Alcohol level noted to be < 10.  -Patient with no significant withdrawal noted on examination, noted overnight to have some diaphoresis. -Continue Ativan CIWA withdrawal protocol. -Place on the Librium detox protocol. -We will add stepdown Ativan CIWA as needed protocol. -Continue thiamine, multivitamin, folic acid.   7.  Tobacco dependence -Tobacco cessation. -Nicotine patch.   8.  Transaminitis -Likely secondary to history of alcohol use. -Follow.   9.  Acute renal failure -Likely secondary to prerenal azotemia in the setting of HCTZ, ARB. -Urinalysis nitrite negative, leukocytes negative, 30 of protein, 0-5 WBCs.  Urine sodium at 32, urine creatinine at 144.   -Renal ultrasound pending.   -Urine output noted at 725 cc since admission.   -Renal function improving trending down, creatinine currently at 1.35.    10.  Dehydration -Continue IV fluids.   11.  Abnormal TSH -TSH noted at 0.195.   -Free T4 of 1.39.   -Patient noted now in atrial fibrillation.   -Check a thyroid antibodies, total T3.   -Rate control.   -We will need outpatient follow-up with endocrinology.     DVT prophylaxis: Heparin Code Status: Full Family Communication: Updated patient.  No family at bedside. Disposition: TBD  Status is: Inpatient Remains inpatient appropriate because: Severity of illness   Consultants:  Cardiology: Dr. Harrell Gave 03/09/2022 PCCM: Dr. Chase Caller 03/09/2022  Procedures:  CT head 03/09/2022 Chest x-ray 03/09/2022 Renal ultrasound pending  Antimicrobials:  IV Rocephin 03/09/2022>>>>> IV  azithromycin 03/09/2022   Subjective: Patient sitting up at the side of the bed.  Visibly short of breath.  PT was working with patient.  Heart rate sustaining in the 140s to 180s.  Patient in A-fib.  Patient still with some shortness of breath however feels overall improved from admission.  States cough is becoming more productive.  Denies any chest pain.  Was on BiPAP overnight currently off BiPAP and on 3 L nasal cannula.  Objective: Vitals:   03/10/22 0600 03/10/22 0814 03/10/22 0817 03/10/22 0818  BP: (!) 141/108     Pulse: 92   (!) 105  Resp:    (!) 25  Temp:      TempSrc:      SpO2:  98% 99% 98%  Weight:      Height:        Intake/Output Summary (Last 24 hours) at 03/10/2022 1032 Last data filed at 03/10/2022 0855 Gross per 24 hour  Intake 1459.24 ml  Output 725 ml  Net 734.24 ml   Filed Weights   03/09/22 1358 03/10/22 0456  Weight: 77.1 kg 78.3 kg    Examination:  General exam: Sitting up at the side of the bed, visibly short of breath, heart rate 150s to 180s. Respiratory system: Diffuse wheezing right upper lobe greatest.  Some scattered coarse breath sounds.  Visibly short of breath.  Less tight with fair air movement.  Minimal use of accessory muscles of respiration.   Cardiovascular system: Irregularly irregular.  No JVD, no murmurs rubs or gallops.  No lower extremity edema.  Gastrointestinal system: Abdomen is nondistended, soft and nontender. No  organomegaly or masses felt. Normal bowel sounds heard. Central nervous system: Alert and oriented. No focal neurological deficits. Extremities: Symmetric 5 x 5 power. Skin: No rashes, lesions or ulcers Psychiatry: Judgement and insight appear normal. Mood & affect appropriate.     Data Reviewed: I have personally reviewed following labs and imaging studies  CBC: Recent Labs  Lab 03/09/22 1400 03/09/22 1410 03/10/22 0258  WBC 34.3*  --  23.8*  NEUTROABS 29.8*  --  23.1*  HGB 13.4 14.3 11.3*  HCT 40.4 42.0  33.6*  MCV 102.0*  --  99.7  PLT 368  --  831    Basic Metabolic Panel: Recent Labs  Lab 03/09/22 1400 03/09/22 1410 03/09/22 1927 03/10/22 0258  NA 136 133* 136 138  K 3.6 3.7 3.8 3.5  CL 94*  --  97* 102  CO2 18*  --  20* 25  GLUCOSE 159*  --  229* 237*  BUN 46*  --  47* 39*  CREATININE 2.86*  --  2.04* 1.35*  CALCIUM 9.3  --  8.6* 8.6*  MG 3.4*  --   --  3.2*  PHOS  --   --   --  4.3    GFR: Estimated Creatinine Clearance: 57.2 mL/min (A) (by C-G formula based on SCr of 1.35 mg/dL (H)).  Liver Function Tests: Recent Labs  Lab 03/09/22 1400 03/10/22 0258  AST 70* 125*  ALT 42 54*  ALKPHOS 96 70  BILITOT 1.0 0.6  PROT 8.5* 6.8  ALBUMIN 2.4* 1.9*    CBG: No results for input(s): "GLUCAP" in the last 168 hours.   Recent Results (from the past 240 hour(s))  Respiratory (~20 pathogens) panel by PCR     Status: None   Collection Time: 03/09/22  2:00 PM   Specimen: Nasopharyngeal Swab; Respiratory  Result Value Ref Range Status   Adenovirus NOT DETECTED NOT DETECTED Final   Coronavirus 229E NOT DETECTED NOT DETECTED Final    Comment: (NOTE) The Coronavirus on the Respiratory Panel, DOES NOT test for the novel  Coronavirus (2019 nCoV)    Coronavirus HKU1 NOT DETECTED NOT DETECTED Final   Coronavirus NL63 NOT DETECTED NOT DETECTED Final   Coronavirus OC43 NOT DETECTED NOT DETECTED Final   Metapneumovirus NOT DETECTED NOT DETECTED Final   Rhinovirus / Enterovirus NOT DETECTED NOT DETECTED Final   Influenza A NOT DETECTED NOT DETECTED Final   Influenza B NOT DETECTED NOT DETECTED Final   Parainfluenza Virus 1 NOT DETECTED NOT DETECTED Final   Parainfluenza Virus 2 NOT DETECTED NOT DETECTED Final   Parainfluenza Virus 3 NOT DETECTED NOT DETECTED Final   Parainfluenza Virus 4 NOT DETECTED NOT DETECTED Final   Respiratory Syncytial Virus NOT DETECTED NOT DETECTED Final   Bordetella pertussis NOT DETECTED NOT DETECTED Final   Bordetella Parapertussis NOT  DETECTED NOT DETECTED Final   Chlamydophila pneumoniae NOT DETECTED NOT DETECTED Final   Mycoplasma pneumoniae NOT DETECTED NOT DETECTED Final    Comment: Performed at [facility], [place]. [address] [place], [place] [address]  Resp Panel by RT-PCR (Flu A&B, Covid) Anterior Nasal Swab     Status: None   Collection Time: 03/09/22  2:01 PM   Specimen: Anterior Nasal Swab  Result Value Ref Range Status   SARS Coronavirus 2 by RT PCR NEGATIVE NEGATIVE Final    Comment: (NOTE) SARS-CoV-2 target nucleic acids are NOT DETECTED.  The SARS-CoV-2 RNA is generally detectable in upper respiratory specimens during the acute phase of  infection. The lowest concentration of SARS-CoV-2 viral copies this assay can detect is 138 copies/mL. A negative result does not preclude SARS-Cov-2 infection and should not be used as the sole basis for treatment or other patient management decisions. A negative result may occur with  improper specimen collection/handling, submission of specimen other than nasopharyngeal swab, presence of viral mutation(s) within the areas targeted by this assay, and inadequate number of viral copies(<138 copies/mL). A negative result must be combined with clinical observations, patient history, and epidemiological information. The expected result is Negative.  Fact Sheet for Patients:  EntrepreneurPulse.com.au  Fact Sheet for Healthcare Providers:  IncredibleEmployment.be  This test is no t yet approved or cleared by the [place] FDA and  has been authorized for detection and/or diagnosis of SARS-CoV-2 by FDA under an Emergency Use Authorization (EUA). This EUA will remain  in effect (meaning this test can be used) for the duration of the COVID-19 declaration under Section 564(b)(1) of the Act, 21 U.S.C.section 360bbb-3(b)(1), unless the authorization is terminated  or revoked sooner.       Influenza A by PCR NEGATIVE NEGATIVE  Final   Influenza B by PCR NEGATIVE NEGATIVE Final    Comment: (NOTE) The Xpert Xpress SARS-CoV-2/FLU/RSV plus assay is intended as an aid in the diagnosis of influenza from Nasopharyngeal swab specimens and should not be used as a sole basis for treatment. Nasal washings and aspirates are unacceptable for Xpert Xpress SARS-CoV-2/FLU/RSV testing.  Fact Sheet for Patients: EntrepreneurPulse.com.au  Fact Sheet for Healthcare Providers: IncredibleEmployment.be  This test is not yet approved or cleared by the [place] FDA and has been authorized for detection and/or diagnosis of SARS-CoV-2 by FDA under an Emergency Use Authorization (EUA). This EUA will remain in effect (meaning this test can be used) for the duration of the COVID-19 declaration under Section 564(b)(1) of the Act, 21 U.S.C. section 360bbb-3(b)(1), unless the authorization is terminated or revoked.  Performed at [facility], [place] [address] [place], [place] [address]   MRSA Next Gen by PCR, Nasal     Status: None   Collection Time: 03/10/22  6:40 AM   Specimen: Nasal Mucosa; Nasal Swab  Result Value Ref Range Status   MRSA by PCR Next Gen NOT DETECTED NOT DETECTED Final    Comment: (NOTE) The GeneXpert MRSA Assay (FDA approved for NASAL specimens only), is one component of a comprehensive MRSA colonization surveillance program. It is not intended to diagnose MRSA infection nor to guide or monitor treatment for MRSA infections. Test performance is not FDA approved in patients less than 54 years old. Performed at [facility], [place] [address], [place], [place] [address]          Radiology Studies: CT Head Wo Contrast  Result Date: 03/09/2022 CLINICAL DATA:  Head trauma altered EXAM: CT HEAD WITHOUT CONTRAST TECHNIQUE: Contiguous axial images were obtained from the base of the skull through the vertex without intravenous contrast. RADIATION DOSE REDUCTION:  This exam was performed according to the departmental dose-optimization program which includes automated exposure control, adjustment of the mA and/or kV according to patient size and/or use of iterative reconstruction technique. COMPARISON:  Brain MRI 11/10/2019, CT brain 11/10/2019 FINDINGS: Brain: No acute territorial infarction, hemorrhage or intracranial mass. Atrophy and chronic small vessel ischemic changes of the white matter. Stable ventricle size Vascular: No hyperdense vessels.  Carotid vascular calcification Skull: Normal. Negative for fracture or focal lesion. Sinuses/Orbits: No acute finding. Other: None IMPRESSION: 1. No  CT evidence for acute intracranial abnormality. 2. Atrophy and chronic small vessel ischemic changes of the white matter. Electronically Signed   By: Donavan Foil M.D.   On: 03/09/2022 16:09   DG Chest Port 1 View  Result Date: 03/09/2022 CLINICAL DATA:  Respiratory distress EXAM: PORTABLE CHEST 1 VIEW COMPARISON:  11/10/2019 FINDINGS: The heart size and mediastinal contours are within normal limits. Extensive heterogeneous airspace opacity of the right upper lobe. The visualized skeletal structures are unremarkable. IMPRESSION: Extensive heterogeneous airspace opacity of the right upper lobe, most consistent with infection. Recommend follow-up radiographs in 6-8 weeks to ensure complete radiographic resolution and exclude underlying mass. Electronically Signed   By: Delanna Ahmadi M.D.   On: 03/09/2022 14:21        Scheduled Meds:  budesonide (PULMICORT) nebulizer solution  0.5 mg Nebulization BID   chlordiazePOXIDE  25 mg Oral QID   Followed by   Derrill Memo ON 03/11/2022] chlordiazePOXIDE  25 mg Oral TID   Followed by   Derrill Memo ON 03/12/2022] chlordiazePOXIDE  25 mg Oral BH-qamhs   Followed by   Derrill Memo ON 03/13/2022] chlordiazePOXIDE  25 mg Oral Daily   diltiazem  10 mg Intravenous Once   guaiFENesin  1,200 mg Oral BID   heparin  5,000 Units Subcutaneous Q8H   ipratropium   0.5 mg Nebulization Q4H   levalbuterol  0.63 mg Nebulization Q4H   loratadine  10 mg Oral Daily   methylPREDNISolone (SOLU-MEDROL) injection  60 mg Intravenous Q8H   multivitamin with minerals  1 tablet Oral Daily   nicotine  7 mg Transdermal Daily   pantoprazole  40 mg Oral Q0600   sodium chloride flush  3 mL Intravenous Q12H   thiamine  100 mg Oral Daily   Or   thiamine  100 mg Intravenous Daily   Continuous Infusions:  sodium chloride 125 mL/hr at 03/10/22 0942   azithromycin     cefTRIAXone (ROCEPHIN)  IV     diltiazem (CARDIZEM) infusion 5 mg/hr (41/32/44 0102)   folic acid 1 mg in sodium chloride 0.9 % 50 mL IVPB     lactated ringers 150 mL/hr at 03/10/22 0433   potassium chloride 10 mEq (03/10/22 1012)     LOS: 1 day    Time spent: 45 minutes    Irine Seal, MD [facility]   To contact the attending provider between 7A-7P or the covering provider during after hours 7P-7A, please log into the web site [email or web address] and access using universal [place] password for that web site. If you do not have the password, please call the hospital operator.  03/10/2022, 10:32 AM

## 2022-03-10 NOTE — Significant Event (Signed)
Rapid Response Event Note   Reason for Call :  Tachycardia, tachypneic, and diaphoretic  Initial Focused Assessment:  Patient currently lying in bed in no acute distress. He is currently wearing BiPAP. No complaints of chest pain but he states that the BiPAP has helped with his work of breathing. Patient has wheezing on exam. Patient reported to be diaphoretic by nursing. Patient is currently in Afib RVR on cardizem gtt.     Interventions:  ABG- 7.37, 44, 100, 25.4 bicarb Ativan 2 mg for possible withdrawal EKG- showed afib with rvr with premature ventricular contractions  Plan of Care:  Patient will be transferred to ICU for higher level of care   Event Summary:   MD Notified: Dr. Janee Morn bedside Call Time: 1526 Arrival Time: 1536 End Time: 1630  Andrey Spearman, RN

## 2022-03-11 ENCOUNTER — Inpatient Hospital Stay (HOSPITAL_COMMUNITY): Payer: Medicare HMO

## 2022-03-11 DIAGNOSIS — F1029 Alcohol dependence with unspecified alcohol-induced disorder: Secondary | ICD-10-CM

## 2022-03-11 DIAGNOSIS — N179 Acute kidney failure, unspecified: Secondary | ICD-10-CM | POA: Diagnosis not present

## 2022-03-11 DIAGNOSIS — I4891 Unspecified atrial fibrillation: Secondary | ICD-10-CM

## 2022-03-11 DIAGNOSIS — E8770 Fluid overload, unspecified: Secondary | ICD-10-CM

## 2022-03-11 DIAGNOSIS — R7989 Other specified abnormal findings of blood chemistry: Secondary | ICD-10-CM | POA: Diagnosis not present

## 2022-03-11 DIAGNOSIS — J9601 Acute respiratory failure with hypoxia: Secondary | ICD-10-CM | POA: Diagnosis not present

## 2022-03-11 DIAGNOSIS — J45901 Unspecified asthma with (acute) exacerbation: Secondary | ICD-10-CM | POA: Diagnosis not present

## 2022-03-11 DIAGNOSIS — R778 Other specified abnormalities of plasma proteins: Secondary | ICD-10-CM

## 2022-03-11 LAB — COMPREHENSIVE METABOLIC PANEL
ALT: 66 U/L — ABNORMAL HIGH (ref 0–44)
AST: 100 U/L — ABNORMAL HIGH (ref 15–41)
Albumin: 1.8 g/dL — ABNORMAL LOW (ref 3.5–5.0)
Alkaline Phosphatase: 81 U/L (ref 38–126)
Anion gap: 10 (ref 5–15)
BUN: 34 mg/dL — ABNORMAL HIGH (ref 8–23)
CO2: 27 mmol/L (ref 22–32)
Calcium: 8.5 mg/dL — ABNORMAL LOW (ref 8.9–10.3)
Chloride: 101 mmol/L (ref 98–111)
Creatinine, Ser: 1.12 mg/dL (ref 0.61–1.24)
GFR, Estimated: >60 mL/min (ref >60)
Glucose, Bld: 230 mg/dL — ABNORMAL HIGH (ref 70–99)
Potassium: 3.4 mmol/L — ABNORMAL LOW (ref 3.5–5.1)
Sodium: 138 mmol/L (ref 135–145)
Total Bilirubin: 0.4 mg/dL (ref 0.3–1.2)
Total Protein: 6.8 g/dL (ref 6.5–8.1)

## 2022-03-11 LAB — CBC WITH DIFFERENTIAL/PLATELET
Abs Immature Granulocytes: 0 10*3/uL (ref 0.00–0.07)
Basophils Absolute: 0 10*3/uL (ref 0.0–0.1)
Basophils Relative: 0 %
Eosinophils Absolute: 0 10*3/uL (ref 0.0–0.5)
Eosinophils Relative: 0 %
HCT: 32.4 % — ABNORMAL LOW (ref 39.0–52.0)
Hemoglobin: 10.5 g/dL — ABNORMAL LOW (ref 13.0–17.0)
Lymphocytes Relative: 2 %
Lymphs Abs: 0.4 10*3/uL — ABNORMAL LOW (ref 0.7–4.0)
MCH: 33.3 pg (ref 26.0–34.0)
MCHC: 32.4 g/dL (ref 30.0–36.0)
MCV: 102.9 fL — ABNORMAL HIGH (ref 80.0–100.0)
Monocytes Absolute: 0.8 10*3/uL (ref 0.1–1.0)
Monocytes Relative: 4 %
Neutro Abs: 18.6 10*3/uL — ABNORMAL HIGH (ref 1.7–7.7)
Neutrophils Relative %: 94 %
Platelets: 303 10*3/uL (ref 150–400)
RBC: 3.15 MIL/uL — ABNORMAL LOW (ref 4.22–5.81)
RDW: 12.7 % (ref 11.5–15.5)
WBC: 19.8 10*3/uL — ABNORMAL HIGH (ref 4.0–10.5)
nRBC: 0 /100 WBC
nRBC: 0.1 % (ref 0.0–0.2)

## 2022-03-11 LAB — T3, FREE: T3, Free: 1.6 pg/mL — ABNORMAL LOW (ref 2.0–4.4)

## 2022-03-11 LAB — GLUCOSE, CAPILLARY
Glucose-Capillary: 154 mg/dL — ABNORMAL HIGH (ref 70–99)
Glucose-Capillary: 175 mg/dL — ABNORMAL HIGH (ref 70–99)
Glucose-Capillary: 179 mg/dL — ABNORMAL HIGH (ref 70–99)
Glucose-Capillary: 190 mg/dL — ABNORMAL HIGH (ref 70–99)
Glucose-Capillary: 209 mg/dL — ABNORMAL HIGH (ref 70–99)
Glucose-Capillary: 263 mg/dL — ABNORMAL HIGH (ref 70–99)

## 2022-03-11 LAB — IRON AND TIBC
Iron: 39 ug/dL — ABNORMAL LOW (ref 45–182)
Saturation Ratios: 19 % (ref 17.9–39.5)
TIBC: 206 ug/dL — ABNORMAL LOW (ref 250–450)
UIBC: 167 ug/dL

## 2022-03-11 LAB — RETICULOCYTES
Immature Retic Fract: 17.2 % — ABNORMAL HIGH (ref 2.3–15.9)
RBC.: 3.13 MIL/uL — ABNORMAL LOW (ref 4.22–5.81)
Retic Count, Absolute: 60.1 10*3/uL (ref 19.0–186.0)
Retic Ct Pct: 1.9 % (ref 0.4–3.1)

## 2022-03-11 LAB — HEPARIN LEVEL (UNFRACTIONATED)
Heparin Unfractionated: 0.19 IU/mL — ABNORMAL LOW (ref 0.30–0.70)
Heparin Unfractionated: 0.4 IU/mL (ref 0.30–0.70)
Heparin Unfractionated: 0.28 IU/mL — ABNORMAL LOW (ref 0.30–0.70)

## 2022-03-11 LAB — FOLATE: Folate: 4.7 ng/mL — ABNORMAL LOW (ref >5.9)

## 2022-03-11 LAB — FERRITIN: Ferritin: 1145 ng/mL — ABNORMAL HIGH (ref 24–336)

## 2022-03-11 LAB — HEMOGLOBIN A1C
Hgb A1c MFr Bld: 5.4 % (ref 4.8–5.6)
Mean Plasma Glucose: 108.28 mg/dL

## 2022-03-11 LAB — ECHOCARDIOGRAM COMPLETE
Height: 74 in
S' Lateral: 3 cm
Weight: 2790.14 oz

## 2022-03-11 LAB — URINE CULTURE: Culture: NO GROWTH

## 2022-03-11 LAB — VITAMIN B12: Vitamin B-12: 854 pg/mL (ref 180–914)

## 2022-03-11 LAB — LACTIC ACID, PLASMA: Lactic Acid, Venous: 1.8 mmol/L (ref 0.5–1.9)

## 2022-03-11 LAB — BRAIN NATRIURETIC PEPTIDE: B Natriuretic Peptide: 586.3 pg/mL — ABNORMAL HIGH (ref 0.0–100.0)

## 2022-03-11 MED ORDER — POTASSIUM CHLORIDE CRYS ER 20 MEQ PO TBCR
40.0000 meq | EXTENDED_RELEASE_TABLET | Freq: Once | ORAL | Status: AC
  Administered 2022-03-11: 40 meq via ORAL
  Filled 2022-03-11: qty 2

## 2022-03-11 MED ORDER — PANTOPRAZOLE SODIUM 40 MG PO TBEC
40.0000 mg | DELAYED_RELEASE_TABLET | Freq: Two times a day (BID) | ORAL | Status: DC
  Administered 2022-03-11 – 2022-03-23 (×24): 40 mg via ORAL
  Filled 2022-03-11 (×24): qty 1

## 2022-03-11 MED ORDER — IPRATROPIUM BROMIDE 0.02 % IN SOLN
0.5000 mg | Freq: Three times a day (TID) | RESPIRATORY_TRACT | Status: DC
  Administered 2022-03-12: 0.5 mg via RESPIRATORY_TRACT
  Filled 2022-03-11: qty 2.5

## 2022-03-11 MED ORDER — PERFLUTREN LIPID MICROSPHERE
1.0000 mL | INTRAVENOUS | Status: AC | PRN
  Administered 2022-03-11: 2 mL via INTRAVENOUS

## 2022-03-11 MED ORDER — LEVALBUTEROL HCL 0.63 MG/3ML IN NEBU
0.6300 mg | INHALATION_SOLUTION | Freq: Three times a day (TID) | RESPIRATORY_TRACT | Status: DC
  Administered 2022-03-12: 0.63 mg via RESPIRATORY_TRACT
  Filled 2022-03-11: qty 3

## 2022-03-11 MED ORDER — HEPARIN BOLUS VIA INFUSION
2000.0000 [IU] | Freq: Once | INTRAVENOUS | Status: AC
  Administered 2022-03-11: 2000 [IU] via INTRAVENOUS
  Filled 2022-03-11: qty 2000

## 2022-03-11 MED ORDER — FUROSEMIDE 10 MG/ML IJ SOLN
40.0000 mg | Freq: Once | INTRAMUSCULAR | Status: AC
  Administered 2022-03-11: 40 mg via INTRAVENOUS
  Filled 2022-03-11: qty 4

## 2022-03-11 NOTE — Progress Notes (Signed)
ANTICOAGULATION CONSULT NOTE - Follow up Consult  Pharmacy Consult for heparin  Indication: atrial fibrillation  No Known Allergies  Patient Measurements: Height: 6\' 2"  (188 cm) Weight: 79.1 kg (174 lb 6.1 oz) IBW/kg (Calculated) : 82.2 Heparin Dosing Weight: 77kg  Vital Signs: Temp: 97.5 F (36.4 C) (09/03 1122) Temp Source: Oral (09/03 1122) BP: 120/76 (09/03 1230) Pulse Rate: 87 (09/03 1230)  Labs: Recent Labs    03/09/22 1400 03/09/22 1410 03/09/22 1653 03/09/22 1927 03/09/22 2250 03/10/22 0258 03/10/22 1757 03/10/22 2108 03/10/22 2127 03/11/22 0430 03/11/22 0500 03/11/22 1250  HGB 13.4   < >  --   --   --  11.3*  --   --  11.1* 10.5*  --   --   HCT 40.4   < >  --   --   --  33.6*  --   --  34.0* 32.4*  --   --   PLT 368  --   --   --   --  275  --   --  291 303  --   --   LABPROT 16.0*  --   --   --   --   --   --   --   --   --   --   --   INR 1.3*  --   --   --   --   --   --   --   --   --   --   --   HEPARINUNFRC  --   --   --   --   --   --   --  0.11*  --   --  0.19* 0.40  CREATININE 2.86*  --   --  2.04*  --  1.35* 1.15  --   --  1.12  --   --   CKTOTAL 173  --   --   --   --   --   --   --   --   --   --   --   TROPONINIHS 181*  --  150* 143* 118*  --   --   --   --   --   --   --    < > = values in this interval not displayed.     Estimated Creatinine Clearance: 69.6 mL/min (by C-G formula based on SCr of 1.12 mg/dL).   Medical History: Past Medical History:  Diagnosis Date   Allergic rhinitis    Asthma    BPH (benign prostatic hyperplasia)    Carpal tunnel syndrome    Hypertension     Medications:  Scheduled:   budesonide (PULMICORT) nebulizer solution  0.5 mg Nebulization BID   Chlorhexidine Gluconate Cloth  6 each Topical Daily   folic acid  1 mg Intravenous Daily   guaiFENesin  1,200 mg Oral BID   insulin aspart  0-15 Units Subcutaneous Q4H   ipratropium  0.5 mg Nebulization Q4H   levalbuterol  0.63 mg Nebulization Q4H    loratadine  10 mg Oral Daily   LORazepam  0-4 mg Intravenous Q4H   Followed by   05/11/22 ON 03/12/2022] LORazepam  0-4 mg Intravenous Q8H   methylPREDNISolone (SOLU-MEDROL) injection  40 mg Intravenous Daily   multivitamin with minerals  1 tablet Oral Daily   nicotine  7 mg Transdermal Daily   mouth rinse  15 mL Mouth Rinse 4 times per day   pantoprazole  40  mg Oral BID AC   sodium chloride flush  10-40 mL Intracatheter Q12H   sodium chloride flush  3 mL Intravenous Q12H   thiamine  100 mg Oral Daily   Or   thiamine  100 mg Intravenous Daily   Infusions:   sodium chloride Stopped (03/10/22 2032)   azithromycin Stopped (03/10/22 1510)   cefTRIAXone (ROCEPHIN)  IV Stopped (03/10/22 1405)   diltiazem (CARDIZEM) infusion 15 mg/hr (03/11/22 1300)   heparin 1,500 Units/hr (03/11/22 1300)    Assessment: 71 YO male presenting 9/1 with sepsis due to pneumonia. Currently in Afib--no [facility] PTA. Troponin and BNP both elevated likely due to sepsis and AKI.   Hgb 14.3>>10.5, possibly dilutional. Platelets stable. Heparin level therapeutic at 0.4 after rate increase.   Goal of Therapy:  Heparin level 0.3-0.7 units/ml Monitor platelets by anticoagulation protocol: Yes   Plan:   Continue heparin 1500 units/hr Check heparin level in 8 hours and daily  Thank you for allowing pharmacy to participate in this patient's care.  Enos Fling, PharmD PGY2 Pharmacy Resident 03/11/2022 1:31 PM Check [email or web address] for unit specific pharmacy number

## 2022-03-11 NOTE — Progress Notes (Signed)
ANTICOAGULATION CONSULT NOTE - Follow Up Consult  Pharmacy Consult for heparin Indication: atrial fibrillation  Labs: Recent Labs    03/09/22 1400 03/09/22 1410 03/09/22 1653 03/09/22 1927 03/09/22 2250 03/10/22 0258 03/10/22 1757 03/10/22 2108 03/10/22 2127 03/11/22 0430 03/11/22 0500  HGB 13.4   < >  --   --   --  11.3*  --   --  11.1* 10.5*  --   HCT 40.4   < >  --   --   --  33.6*  --   --  34.0* 32.4*  --   PLT 368  --   --   --   --  275  --   --  291 303  --   LABPROT 16.0*  --   --   --   --   --   --   --   --   --   --   INR 1.3*  --   --   --   --   --   --   --   --   --   --   HEPARINUNFRC  --   --   --   --   --   --   --  0.11*  --   --  0.19*  CREATININE 2.86*  --   --  2.04*  --  1.35* 1.15  --   --  1.12  --   CKTOTAL 173  --   --   --   --   --   --   --   --   --   --   TROPONINIHS 181*  --  150* 143* 118*  --   --   --   --   --   --    < > = values in this interval not displayed.    Assessment: 71yo male remains subtherapeutic on heparin after rate change; no infusion issues or signs of bleeding per RN.  Goal of Therapy:  Heparin level 0.3-0.7 units/ml   Plan:  Will rebolus with heparin 2000 units and increase heparin infusion by 3 units/kg/hr to 1500 units/hr and check level in 6 hours.    Vernard Gambles, PharmD, BCPS  03/11/2022,6:21 AM

## 2022-03-11 NOTE — Progress Notes (Signed)
PROGRESS NOTE    Mark [name]  VPX:[id number] DOB: 1951/03/21 DOA: 03/09/2022 PCP: Cipriano Mile, NP    No chief complaint on file.   Brief Narrative:  Patient 71 year old gentleman history of asthma, alcohol dependence, hypertension, BPH, allergic rhinitis presented to the ED with 4-day history of productive cough brownish sputum, acute respiratory distress requiring BiPAP.  Patient noted on work-up to be septic with concern for right upper lobe pneumonia, patient in acute respiratory distress concern for acute asthma exacerbation.  Patient noted to go into atrial fibrillation with RVR this morning 03/10/2022.  PCCM cardiology following.   Assessment & Plan:   Principal Problem:   Acute respiratory failure with hypoxia (HCC) Active Problems:   Sepsis due to pneumonia ([place])   Asthma exacerbation   Transaminitis   Elevated troponin   EtOH dependence (HCC)   Tobacco abuse   PNA (pneumonia)   ARF (acute renal failure) (HCC)   Dehydration   Abnormal TSH   Chronic alcohol abuse   Severe persistent asthma with acute exacerbation   AKI (acute kidney injury) ([place])   Severe sepsis (HCC)   New onset a-fib (HCC)   Hypervolemia  #1 acute respiratory failure with hypoxia secondary to right upper lobe pneumonia and acute asthma exacerbation/COPD exacerbation, concern for volume overload/CHF exacerbation. -Likely multifactorial secondary to right upper lobe pneumonia in the setting of an acute asthma exacerbation/COPD exacerbation and concern for volume overload/CHF exacerbation. -On admission patient was brought in on CPAP, had received IM epinephrine, 2 g of mag sulfate, 125 mg of Solu-Medrol, 3 nebulizer treatments in the field. -Patient transition to BiPAP. -Chest x-ray done concerning for right upper lobe pneumonia. -Patient noted on admission to be tachypneic, tachycardic, noted to have a significant leukocytosis, concerning for sepsis. -Blood cultures obtained and  pending. -Urinalysis nitrite negative leukocytes negative. -Urine strep pneumococcus antigen negative. -MRSA PCR negative. -Respiratory viral panel negative. -Urine Legionella antigen pending.   -Sputum Gram stain and culture pending. -Lactic acid level elevated and trending down. -Continue empiric IV Rocephin, IV azithromycin, Claritin, PPI, Pulmicort, Atrovent and Xopenex nebs, IV Solu-Medrol every 8 hours. -Patient noted to have gone into worsening respiratory distress the afternoon of 03/10/2022, transferred to the ICU, seen by critical care and concerns for also possible volume overload. -Patient placed back on the BiPAP which was subsequently removed last night due to nausea currently on 4 L nasal cannula with sats in the mid 90s. -Patient given Lasix 100 mg IV x1.  PCCM on 03/10/2022 with a urine output of 2.8 L over the past 24 hours. -Continue current treatment. -We will give a dose of Lasix 40 IV x1. -PCCM following and appreciate input and recommendations.   2.  Sepsis secondary to pneumonia, POA -Patient on admission met criteria for sepsis with tachycardia, tachypnea, significant leukocytosis, lactic acidosis, acute respiratory distress, chest x-ray consistent with a right upper lobe pneumonia. -Blood cultures ordered and pending. -MRSA PCR negative. -Respiratory viral panel negative. -Urinalysis nitrite negative leukocytes negative. -Lactic acid elevated at 7.6 on admission>>> 4.6>>> 2.1 >>> 1.8 -Urine pneumococcus antigen negative. -Urine Legionella antigen pending. -Sepsis protocol underway in the ED. -Continue empiric IV Rocephin, IV azithromycin, Mucinex, neb treatments.   -PCCM following and I appreciate their input and recommendations.    3.  New onset atrial fibrillation -Patient noted to go into A-fib with RVR with heart rate sustaining greater than the 140s as high as 182 on 03/10/2022. -Likely secondary to sepsis, acute pulmonary issues.  Patient also noted to  have an  abnormal TSH. -Cardiac enzymes cycled were elevated but trending down (demand ischemia possibly). -2D echo with EF of 50 to 55%, NWMA, left ventricular diastolic parameters indeterminate, moderately reduced right ventricular systolic function, moderately enlarged right ventricular size, trivial MVR, no evidence of mitral stenosis.  Aortic valve sclerosis.  -TSH abnormal.  Elevated free T4. -Due to abnormal TSH we will likely try to avoid amiodarone. -Continue Cardizem drip. -Continue heparin. -Cardiology following and appreciate input and recommendations.  #4: Volume overload/acute CHF exacerbation -Patient noted to have worsening respiratory status on 03/10/2022, seen by PCCM and felt to be in volume overload.  Patient noted to have an elevated BNP. - ?  Secondary to new onset A-fib versus aggressive fluid resuscitation on admission with sepsis. -Patient given Lasix 100 mg IV x1 with a urine output of 2.8 L over the past 24 hours. -Cardiac enzymes elevated but trended down, 2D echo with EF of 50 to 55%, NWMA, left ventricular diastolic parameters indeterminate, moderately reduced right ventricular systolic function, moderately enlarged right ventricular size, trivial MVR, no evidence of mitral stenosis.  Aortic valve sclerosis.  -We will give a dose of Lasix 40 mg IV x1. -Cardiology following.   5.  Elevated troponin -Likely demand ischemia secondary to problem #1 and 2. -Serial troponins trending down.   -2D echo with EF of 50 to 55%, NWMA, left ventricular diastolic parameters indeterminate, moderately reduced right ventricular systolic function, moderately enlarged right ventricular size, trivial MVR, no evidence of mitral stenosis.  Aortic valve sclerosis.  -Continue aspirin.   -Patient now in A-fib with RVR and currently on Cardizem drip. -Cardiology consulted and following and appreciate input and recommendations.  -Supportive care.   6.  Hypertension -Patient currently in A-fib and  as such IV Lopressor and Norvasc have been discontinued.   -Currently on Cardizem drip.    7.  History of alcohol use -Alcohol level noted to be < 10.  -Patient with no significant withdrawal noted on examination, noted overnight to have some diaphoresis. -Due to somnolence yesterday, Librium detox protocol discontinued by PCCM. -Continue the Ativan stepdown CIWA protocol as needed. -Continue thiamine, multivitamin, folic acid.   8.  Tobacco dependence -Tobacco cessation. -Nicotine patch.   9.  Transaminitis -Likely secondary to history of alcohol use. -Follow.   10.  Acute renal failure -Likely secondary to prerenal azotemia in the setting of HCTZ, ARB. -Urinalysis nitrite negative, leukocytes negative, 30 of protein, 0-5 WBCs.  Urine sodium at 32, urine creatinine at 144.   -Urine output with 2.8 L over the past 24 hours after receiving Lasix 100 mg IV x1. -Renal ultrasound unremarkable. -Renal function improved creatinine down to 1.12.   11.  Dehydration -IV fluids saline locked yesterday due to concerns for volume overload per PCCM.   12.  Abnormal TSH -TSH noted at 0.195.   -Free T4 of 1.39.   Thyroid-stimulating immunoglobin/thyroid antibodies pending -Total T3 pending -Patient noted now in atrial fibrillation.   -Currently on Cardizem drip for rate control. -We will need outpatient follow-up with endocrinology.  13.  Anemia -Patient denies any overt bleeding however per RN it was noted that patient had some dark stool yesterday. -Check FOBT. -Check an anemia panel. -Follow H&H. -Transfusion threshold hemoglobin < 7   DVT prophylaxis: Heparin Code Status: Full Family Communication: Updated patient.  No family at bedside. Disposition: TBD  Status is: Inpatient Remains inpatient appropriate because: Severity of illness   Consultants:  Cardiology: Dr. Harrell Gave 03/09/2022 PCCM: Dr.  Ramaswamy 03/09/2022  Procedures:  CT head 03/09/2022 Chest x-ray  03/09/2022 Renal ultrasound 03/10/2022 2D echo 03/11/2022 PICC line 03/10/2022  Antimicrobials:  IV Rocephin 03/09/2022>>>>> IV azithromycin 03/09/2022   Subjective: Patient laying in bed.  Feels breathing/shortness of breath slowly improving from yesterday.  Stated he had significant urine output yesterday.  No chest pain.  Still with a productive cough.  Was on BiPAP overnight which has been discontinued as patient noted to be nauseous overnight and placed on nasal cannula.  Per RN patient with some dark stools overnight.  Patient denies any overt bleeding.  On Cardizem drip.  Objective: Vitals:   03/11/22 0845 03/11/22 0915 03/11/22 0930 03/11/22 1000  BP: 131/81 119/88 (!) 136/96 (!) 116/97  Pulse: (!) 110 (!) 118 93 (!) 106  Resp: (!) 32 (!) 25 (!) 28 (!) 28  Temp:      TempSrc:      SpO2: 95% 93% 97% (!) 82%  Weight:      Height:        Intake/Output Summary (Last 24 hours) at 03/11/2022 1040 Last data filed at 03/11/2022 1000 Gross per 24 hour  Intake 2013.03 ml  Output 2800 ml  Net -786.97 ml   Filed Weights   03/09/22 1358 03/10/22 0456 03/10/22 1900  Weight: 77.1 kg 78.3 kg 79.1 kg    Examination:  General exam: Laying in bed, on 4 L nasal cannula with sats of 96%, heart rate in the low 100s.  Respiratory system: Diffuse wheezing.  Poor air movement.  No significant use of accessory muscles of respiration.  On 4 L nasal cannula.  Cardiovascular system: Irregularly irregular.  ?+JVD, no murmurs rubs or gallops.  No lower extremity edema.  Gastrointestinal system: Abdomen is soft, nontender, nondistended, positive bowel sounds.  No rebound.  No guarding.  Central nervous system: Alert and oriented. No focal neurological deficits.  Moving extremities spontaneously. Extremities: Symmetric 5 x 5 power. Skin: No rashes, lesions or ulcers Psychiatry: Judgement and insight appear normal. Mood & affect appropriate.     Data Reviewed: I have personally reviewed following labs and  imaging studies  CBC: Recent Labs  Lab 03/09/22 1400 03/09/22 1410 03/10/22 0258 03/10/22 2127 03/11/22 0430  WBC 34.3*  --  23.8* 22.2* 19.8*  NEUTROABS 29.8*  --  23.1*  --  18.6*  HGB 13.4 14.3 11.3* 11.1* 10.5*  HCT 40.4 42.0 33.6* 34.0* 32.4*  MCV 102.0*  --  99.7 102.4* 102.9*  PLT 368  --  275 291 992    Basic Metabolic Panel: Recent Labs  Lab 03/09/22 1400 03/09/22 1410 03/09/22 1927 03/10/22 0258 03/10/22 1757 03/11/22 0430  NA 136 133* 136 138 136 138  K 3.6 3.7 3.8 3.5 4.0 3.4*  CL 94*  --  97* 102 104 101  CO2 18*  --  20* '25 22 27  ' GLUCOSE 159*  --  229* 237* 285* 230*  BUN 46*  --  47* 39* 37* 34*  CREATININE 2.86*  --  2.04* 1.35* 1.15 1.12  CALCIUM 9.3  --  8.6* 8.6* 8.5* 8.5*  MG 3.4*  --   --  3.2* 3.1*  --   PHOS  --   --   --  4.3 3.1  --     GFR: Estimated Creatinine Clearance: 69.6 mL/min (by C-G formula based on SCr of 1.12 mg/dL).  Liver Function Tests: Recent Labs  Lab 03/09/22 1400 03/10/22 0258 03/11/22 0430  AST 70* 125* 100*  ALT 42  54* 66*  ALKPHOS 96 70 81  BILITOT 1.0 0.6 0.4  PROT 8.5* 6.8 6.8  ALBUMIN 2.4* 1.9* 1.8*    CBG: Recent Labs  Lab 03/10/22 1545 03/10/22 1939 03/10/22 2303 03/11/22 0426 03/11/22 0748  GLUCAP 293* 273* 257* 179* 190*     Recent Results (from the past 240 hour(s))  Blood culture (routine x 2)     Status: None (Preliminary result)   Collection Time: 03/09/22  2:00 PM   Specimen: BLOOD  Result Value Ref Range Status   Specimen Description BLOOD SITE NOT SPECIFIED  Final   Special Requests   Final    BOTTLES DRAWN AEROBIC AND ANAEROBIC Blood Culture adequate volume   Culture   Final    NO GROWTH < 24 HOURS Performed at [facility], [place] [address], [place], [place] [address]    Report Status PENDING  Incomplete  Respiratory (~20 pathogens) panel by PCR     Status: None   Collection Time: 03/09/22  2:00 PM   Specimen: Nasopharyngeal Swab; Respiratory  Result Value Ref Range  Status   Adenovirus NOT DETECTED NOT DETECTED Final   Coronavirus 229E NOT DETECTED NOT DETECTED Final    Comment: (NOTE) The Coronavirus on the Respiratory Panel, DOES NOT test for the novel  Coronavirus (2019 nCoV)    Coronavirus HKU1 NOT DETECTED NOT DETECTED Final   Coronavirus NL63 NOT DETECTED NOT DETECTED Final   Coronavirus OC43 NOT DETECTED NOT DETECTED Final   Metapneumovirus NOT DETECTED NOT DETECTED Final   Rhinovirus / Enterovirus NOT DETECTED NOT DETECTED Final   Influenza A NOT DETECTED NOT DETECTED Final   Influenza B NOT DETECTED NOT DETECTED Final   Parainfluenza Virus 1 NOT DETECTED NOT DETECTED Final   Parainfluenza Virus 2 NOT DETECTED NOT DETECTED Final   Parainfluenza Virus 3 NOT DETECTED NOT DETECTED Final   Parainfluenza Virus 4 NOT DETECTED NOT DETECTED Final   Respiratory Syncytial Virus NOT DETECTED NOT DETECTED Final   Bordetella pertussis NOT DETECTED NOT DETECTED Final   Bordetella Parapertussis NOT DETECTED NOT DETECTED Final   Chlamydophila pneumoniae NOT DETECTED NOT DETECTED Final   Mycoplasma pneumoniae NOT DETECTED NOT DETECTED Final    Comment: Performed at [facility], [place]. [address], [place], [place] [address]  Resp Panel by RT-PCR (Flu A&B, Covid) Anterior Nasal Swab     Status: None   Collection Time: 03/09/22  2:01 PM   Specimen: Anterior Nasal Swab  Result Value Ref Range Status   SARS Coronavirus 2 by RT PCR NEGATIVE NEGATIVE Final    Comment: (NOTE) SARS-CoV-2 target nucleic acids are NOT DETECTED.  The SARS-CoV-2 RNA is generally detectable in upper respiratory specimens during the acute phase of infection. The lowest concentration of SARS-CoV-2 viral copies this assay can detect is 138 copies/mL. A negative result does not preclude SARS-Cov-2 infection and should not be used as the sole basis for treatment or other patient management decisions. A negative result may occur with  improper specimen collection/handling,  submission of specimen other than nasopharyngeal swab, presence of viral mutation(s) within the areas targeted by this assay, and inadequate number of viral copies(<138 copies/mL). A negative result must be combined with clinical observations, patient history, and epidemiological information. The expected result is Negative.  Fact Sheet for Patients:  EntrepreneurPulse.com.au  Fact Sheet for Healthcare Providers:  IncredibleEmployment.be  This test is no t yet approved or cleared by the [place] FDA and  has been authorized for detection  and/or diagnosis of SARS-CoV-2 by FDA under an Emergency Use Authorization (EUA). This EUA will remain  in effect (meaning this test can be used) for the duration of the COVID-19 declaration under Section 564(b)(1) of the Act, 21 U.S.C.section 360bbb-3(b)(1), unless the authorization is terminated  or revoked sooner.       Influenza A by PCR NEGATIVE NEGATIVE Final   Influenza B by PCR NEGATIVE NEGATIVE Final    Comment: (NOTE) The Xpert Xpress SARS-CoV-2/FLU/RSV plus assay is intended as an aid in the diagnosis of influenza from Nasopharyngeal swab specimens and should not be used as a sole basis for treatment. Nasal washings and aspirates are unacceptable for Xpert Xpress SARS-CoV-2/FLU/RSV testing.  Fact Sheet for Patients: EntrepreneurPulse.com.au  Fact Sheet for Healthcare Providers: IncredibleEmployment.be  This test is not yet approved or cleared by the [place] FDA and has been authorized for detection and/or diagnosis of SARS-CoV-2 by FDA under an Emergency Use Authorization (EUA). This EUA will remain in effect (meaning this test can be used) for the duration of the COVID-19 declaration under Section 564(b)(1) of the Act, 21 U.S.C. section 360bbb-3(b)(1), unless the authorization is terminated or revoked.  Performed at [facility], [place] [address] [place], [place] [address]   Blood culture (routine x 2)     Status: None (Preliminary result)   Collection Time: 03/09/22  2:04 PM   Specimen: BLOOD  Result Value Ref Range Status   Specimen Description BLOOD BLOOD RIGHT FOREARM  Final   Special Requests   Final    BOTTLES DRAWN AEROBIC AND ANAEROBIC Blood Culture adequate volume   Culture   Final    NO GROWTH < 24 HOURS Performed at [facility], [place] [address] [place], [place] [address]    Report Status PENDING  Incomplete  Urine Culture     Status: None   Collection Time: 03/09/22  5:35 PM   Specimen: Urine, Catheterized  Result Value Ref Range Status   Specimen Description URINE, CATHETERIZED  Final   Special Requests NONE  Final   Culture   Final    NO GROWTH Performed at [facility], [address] [place], [place] [address]    Report Status 03/11/2022 FINAL  Final  MRSA Next Gen by PCR, Nasal     Status: None   Collection Time: 03/10/22  6:40 AM   Specimen: Nasal Mucosa; Nasal Swab  Result Value Ref Range Status   MRSA by PCR Next Gen NOT DETECTED NOT DETECTED Final    Comment: (NOTE) The GeneXpert MRSA Assay (FDA approved for NASAL specimens only), is one component of a comprehensive MRSA colonization surveillance program. It is not intended to diagnose MRSA infection nor to guide or monitor treatment for MRSA infections. Test performance is not FDA approved in patients less than 61 years old. Performed at [facility], [place] [address], [place], [place] [address]          Radiology Studies: ECHOCARDIOGRAM COMPLETE  Result Date: 03/11/2022    ECHOCARDIOGRAM REPORT   Patient Name:   Mark [name] Date of Exam: 03/11/2022 Medical Rec #:  [id number]       Height:       74.0 in Accession #:    [id number]      Weight:       174.4 lb Date of Birth:  [birth year]       BSA:          2.050 m Patient  Age:    30 years        BP:           131/81 mmHg Patient Gender: M               HR:            117 bpm. Exam Location:  Inpatient Procedure: 2D Echo, Cardiac Doppler, Color Doppler and Intracardiac            Opacification Agent Indications:    Elevated troponin  History:        Patient has no prior history of Echocardiogram examinations.                 Signs/Symptoms:Shortness of Breath. Atrial fibrillation with                 RVR.  Sonographer:    Merrie Roof RDCS Referring Phys: Irine Seal, V IMPRESSIONS  1. Abnormal septal motion . Left ventricular ejection fraction, by estimation, is 50 to 55%. The left ventricle has low normal function. The left ventricle has no regional wall motion abnormalities. Left ventricular diastolic parameters are indeterminate.  2. Right ventricular systolic function is moderately reduced. The right ventricular size is moderately enlarged.  3. The mitral valve is abnormal. Trivial mitral valve regurgitation. No evidence of mitral stenosis.  4. The aortic valve is tricuspid. There is mild calcification of the aortic valve. There is mild thickening of the aortic valve. Aortic valve regurgitation is not visualized. Aortic valve sclerosis is present, with no evidence of aortic valve stenosis.  5. The inferior vena cava is dilated in size with >50% respiratory variability, suggesting right atrial pressure of 8 mmHg. FINDINGS  Left Ventricle: Abnormal septal motion. Left ventricular ejection fraction, by estimation, is 50 to 55%. The left ventricle has low normal function. The left ventricle has no regional wall motion abnormalities. Definity contrast agent was given IV to delineate the left ventricular endocardial borders. The left ventricular internal cavity size was normal in size. There is no left ventricular hypertrophy. Left ventricular diastolic parameters are indeterminate. Right Ventricle: The right ventricular size is moderately enlarged. No increase in right ventricular wall thickness. Right ventricular systolic function is moderately reduced. Left Atrium: Left  atrial size was normal in size. Right Atrium: Right atrial size was normal in size. Pericardium: There is no evidence of pericardial effusion. Mitral Valve: The mitral valve is abnormal. There is mild thickening of the mitral valve leaflet(s). There is mild calcification of the mitral valve leaflet(s). Mild mitral annular calcification. Trivial mitral valve regurgitation. No evidence of mitral valve stenosis. Tricuspid Valve: The tricuspid valve is normal in structure. Tricuspid valve regurgitation is mild . No evidence of tricuspid stenosis. Aortic Valve: The aortic valve is tricuspid. There is mild calcification of the aortic valve. There is mild thickening of the aortic valve. Aortic valve regurgitation is not visualized. Aortic valve sclerosis is present, with no evidence of aortic valve stenosis. Pulmonic Valve: The pulmonic valve was normal in structure. Pulmonic valve regurgitation is not visualized. No evidence of pulmonic stenosis. Aorta: The aortic root is normal in size and structure. Venous: The inferior vena cava is dilated in size with greater than 50% respiratory variability, suggesting right atrial pressure of 8 mmHg. IAS/Shunts: No atrial level shunt detected by color flow Doppler.  LEFT VENTRICLE PLAX 2D LVIDd:         4.10 cm LVIDs:         3.00 cm LV PW:  1.10 cm LV IVS:        1.10 cm LVOT diam:     2.20 cm LV SV:         51 LV SV Index:   25 LVOT Area:     3.80 cm  RIGHT VENTRICLE            IVC RV Basal diam:  5.00 cm    IVC diam: 2.70 cm RV Mid diam:    3.80 cm RV S prime:     7.95 cm/s TAPSE (M-mode): 1.3 cm LEFT ATRIUM             Index        RIGHT ATRIUM           Index LA diam:        3.70 cm 1.80 cm/m   RA Area:     22.80 cm LA Vol (A2C):   79.2 ml 38.63 ml/m  RA Volume:   92.00 ml  44.87 ml/m LA Vol (A4C):   56.0 ml 27.31 ml/m LA Biplane Vol: 68.7 ml 33.50 ml/m  AORTIC VALVE LVOT Vmax:   94.20 cm/s LVOT Vmean:  59.700 cm/s LVOT VTI:    0.135 m  AORTA Ao Root diam: 2.90 cm   SHUNTS Systemic VTI:  0.14 m Systemic Diam: 2.20 cm Jenkins Rouge MD Electronically signed by Jenkins Rouge MD Signature Date/Time: 03/11/2022/9:21:28 AM    Final    DG CHEST PORT 1 VIEW  Result Date: 03/10/2022 CLINICAL DATA:  Central line placement. EXAM: PORTABLE CHEST 1 VIEW COMPARISON:  Chest radiograph dated 03/09/2022. FINDINGS: Right-sided PICC with tip over central SVC. No pneumothorax. Right upper lobe opacities similar or slightly progressed since the prior radiograph. Left lung is clear. No pleural effusion. The cardiac silhouette is within normal limits. No acute osseous pathology. IMPRESSION: 1. Right-sided PICC with tip over central SVC. No pneumothorax. 2. Right upper lobe opacities similar or slightly progressed since the prior radiograph. Electronically Signed   By: Anner Crete M.D.   On: 03/10/2022 19:38   [place] EKG SITE RITE  Result Date: 03/10/2022 If Site Rite image not attached, placement could not be confirmed due to current cardiac rhythm.  US RENAL  Result Date: 03/10/2022 CLINICAL DATA:  Acute renal failure. EXAM: RENAL / URINARY TRACT ULTRASOUND COMPLETE COMPARISON:  None Available. FINDINGS: Right Kidney: Renal measurements: 11.6 x 4.3 x 6.1 cm = volume: 161.4 mL. Echogenicity within normal limits. No mass or hydronephrosis visualized. Left Kidney: Renal measurements: 11.2 x 6.1 x 6.0 cm = volume: 215.7 mL. Echogenicity within normal limits. No mass or hydronephrosis visualized. Bladder: Appears normal for degree of bladder distention. Other: None. IMPRESSION: Normal renal ultrasound. Electronically Signed   By: Lajean Manes M.D.   On: 03/10/2022 11:57   CT Head Wo Contrast  Result Date: 03/09/2022 CLINICAL DATA:  Head trauma altered EXAM: CT HEAD WITHOUT CONTRAST TECHNIQUE: Contiguous axial images were obtained from the base of the skull through the vertex without intravenous contrast. RADIATION DOSE REDUCTION: This exam was performed according to the departmental  dose-optimization program which includes automated exposure control, adjustment of the mA and/or kV according to patient size and/or use of iterative reconstruction technique. COMPARISON:  Brain MRI 11/10/2019, CT brain 11/10/2019 FINDINGS: Brain: No acute territorial infarction, hemorrhage or intracranial mass. Atrophy and chronic small vessel ischemic changes of the white matter. Stable ventricle size Vascular: No hyperdense vessels.  Carotid vascular calcification Skull: Normal. Negative for fracture or focal lesion. Sinuses/Orbits:  No acute finding. Other: None IMPRESSION: 1. No CT evidence for acute intracranial abnormality. 2. Atrophy and chronic small vessel ischemic changes of the white matter. Electronically Signed   By: Donavan Foil M.D.   On: 03/09/2022 16:09   DG Chest Port 1 View  Result Date: 03/09/2022 CLINICAL DATA:  Respiratory distress EXAM: PORTABLE CHEST 1 VIEW COMPARISON:  11/10/2019 FINDINGS: The heart size and mediastinal contours are within normal limits. Extensive heterogeneous airspace opacity of the right upper lobe. The visualized skeletal structures are unremarkable. IMPRESSION: Extensive heterogeneous airspace opacity of the right upper lobe, most consistent with infection. Recommend follow-up radiographs in 6-8 weeks to ensure complete radiographic resolution and exclude underlying mass. Electronically Signed   By: Delanna Ahmadi M.D.   On: 03/09/2022 14:21        Scheduled Meds:  budesonide (PULMICORT) nebulizer solution  0.5 mg Nebulization BID   Chlorhexidine Gluconate Cloth  6 each Topical Daily   folic acid  1 mg Intravenous Daily   guaiFENesin  1,200 mg Oral BID   insulin aspart  0-15 Units Subcutaneous Q4H   ipratropium  0.5 mg Nebulization Q4H   levalbuterol  0.63 mg Nebulization Q4H   loratadine  10 mg Oral Daily   LORazepam  0-4 mg Intravenous Q4H   Followed by   Derrill Memo ON 03/12/2022] LORazepam  0-4 mg Intravenous Q8H   methylPREDNISolone (SOLU-MEDROL)  injection  40 mg Intravenous Daily   multivitamin with minerals  1 tablet Oral Daily   nicotine  7 mg Transdermal Daily   mouth rinse  15 mL Mouth Rinse 4 times per day   pantoprazole  40 mg Oral BID AC   sodium chloride flush  10-40 mL Intracatheter Q12H   sodium chloride flush  3 mL Intravenous Q12H   thiamine  100 mg Oral Daily   Or   thiamine  100 mg Intravenous Daily   Continuous Infusions:  sodium chloride Stopped (03/10/22 2032)   azithromycin Stopped (03/10/22 1510)   cefTRIAXone (ROCEPHIN)  IV Stopped (03/10/22 1405)   diltiazem (CARDIZEM) infusion 15 mg/hr (03/11/22 1000)   heparin 1,500 Units/hr (03/11/22 1000)     LOS: 2 days    Time spent: 45 minutes    Irine Seal, MD [facility]   To contact the attending provider between 7A-7P or the covering provider during after hours 7P-7A, please log into the web site [email or web address] and access using universal [place] password for that web site. If you do not have the password, please call the hospital operator.  03/11/2022, 10:40 AM

## 2022-03-11 NOTE — Evaluation (Signed)
Clinical/Bedside Swallow Evaluation Patient Details  Name: Mark [name] MRN: [id number] Date of Birth: [birth year]  Today's Date: 03/11/2022 Time: SLP Start Time (ACUTE ONLY): 0915 SLP Stop Time (ACUTE ONLY): 0924 SLP Time Calculation (min) (ACUTE ONLY): 9 min  Past Medical History:  Past Medical History:  Diagnosis Date   Allergic rhinitis    Asthma    BPH (benign prostatic hyperplasia)    Carpal tunnel syndrome    Hypertension    Past Surgical History: History reviewed. No pertinent surgical history. HPI:  71 y/o male presented to ED on 03/09/22 for respiratory distress, chest pai, and diarrhea. CXR showed extensive airspace in R upper lobe consistent with infection. Admitted for sepsis 2/2 PNA. PMH: asthma, alcohol dependence, HTN, COPD    Assessment / Plan / Recommendation  Clinical Impression  Pt's oropharyngeal swallow function judged to be adequate during swallow assessment. He currently has pna and RN stated he was coughing this morning without po's. His oromotor strength and ROM is adequate and he politely declined volitional cough. There was one delayed cough at end of eval after therapist slightly lowered head of bed. Work of breathing minimally increased. Recommend continue full liquids (per MD due melena in stool yesterday) and recommend upgrade to regular via RN once cleared  Not overly concerned with aspiration at present however recommend precautions such as sit upright and remain upright, take breaks as needed, do not eat/drink if work of breathing increases. ST will sign off at this time. SLP Visit Diagnosis: Dysphagia, unspecified (R13.10)    Aspiration Risk  Mild aspiration risk    Diet Recommendation Thin liquid;Other (Comment) (full liquids- had melena in stool and made full liquid diet)   Liquid Administration via: Cup;Straw Medication Administration: Whole meds with liquid Supervision: Patient able to self feed;Intermittent supervision to cue for compensatory  strategies Compensations: Slow rate;Small sips/bites Postural Changes: Seated upright at 90 degrees;Remain upright for at least 30 minutes after po intake    Other  Recommendations Oral Care Recommendations: Oral care BID    Recommendations for follow up therapy are one component of a multi-disciplinary discharge planning process, led by the attending physician.  Recommendations may be updated based on patient status, additional functional criteria and insurance authorization.  Follow up Recommendations No SLP follow up      Assistance Recommended at Discharge None  Functional Status Assessment    Frequency and Duration            Prognosis        Swallow Study   General HPI: 71 y/o male presented to ED on 03/09/22 for respiratory distress, chest pai, and diarrhea. CXR showed extensive airspace in R upper lobe consistent with infection. Admitted for sepsis 2/2 PNA. PMH: asthma, alcohol dependence, HTN, COPD Type of Study: Bedside Swallow Evaluation Previous Swallow Assessment:  (none) Diet Prior to this Study: Thin liquids;Other (Comment) (full liquids) Temperature Spikes Noted: No Respiratory Status: Nasal cannula History of Recent Intubation: No Behavior/Cognition: Alert;Cooperative;Pleasant mood Oral Cavity Assessment: Within Functional Limits Oral Care Completed by SLP: No Oral Cavity - Dentition: Missing dentition Vision: Functional for self-feeding Self-Feeding Abilities: Able to feed self Patient Positioning: Upright in bed Baseline Vocal Quality: Normal Volitional Cough: Strong Volitional Swallow: Able to elicit    Oral/Motor/Sensory Function Overall Oral Motor/Sensory Function: Within functional limits   Ice Chips Ice chips: Not tested   Thin Liquid Thin Liquid: Within functional limits Presentation: Straw Pharyngeal  Phase Impairments:  (none)    Nectar Thick Nectar Thick Liquid:  Not tested   Honey Thick Honey Thick Liquid: Not tested   Puree Puree: Within  functional limits   Solid     Solid: Within functional limits      Royce Macadamia 03/11/2022,9:38 AM

## 2022-03-11 NOTE — Progress Notes (Signed)
Rounding Note    Patient Name: Mark [name] Date of Encounter: 03/11/2022  [facility] Cardiologist: None   Subjective   Laying fairly comfortably in bed.  Mildly increased respiratory effort.  No chest pain.  Inpatient Medications    Scheduled Meds:  budesonide (PULMICORT) nebulizer solution  0.5 mg Nebulization BID   Chlorhexidine Gluconate Cloth  6 each Topical Daily   folic acid  1 mg Intravenous Daily   guaiFENesin  1,200 mg Oral BID   insulin aspart  0-15 Units Subcutaneous Q4H   ipratropium  0.5 mg Nebulization Q4H   levalbuterol  0.63 mg Nebulization Q4H   loratadine  10 mg Oral Daily   LORazepam  0-4 mg Intravenous Q4H   Followed by   Melene Muller ON 03/12/2022] LORazepam  0-4 mg Intravenous Q8H   methylPREDNISolone (SOLU-MEDROL) injection  40 mg Intravenous Daily   multivitamin with minerals  1 tablet Oral Daily   nicotine  7 mg Transdermal Daily   mouth rinse  15 mL Mouth Rinse 4 times per day   pantoprazole  40 mg Oral BID AC   sodium chloride flush  10-40 mL Intracatheter Q12H   sodium chloride flush  3 mL Intravenous Q12H   thiamine  100 mg Oral Daily   Or   thiamine  100 mg Intravenous Daily   Continuous Infusions:  sodium chloride Stopped (03/10/22 2032)   azithromycin Stopped (03/10/22 1510)   cefTRIAXone (ROCEPHIN)  IV Stopped (03/10/22 1405)   diltiazem (CARDIZEM) infusion 15 mg/hr (03/11/22 1000)   heparin 1,500 Units/hr (03/11/22 1000)   PRN Meds: acetaminophen **OR** acetaminophen, hydrALAZINE, ipratropium, levalbuterol, LORazepam **OR** LORazepam, LORazepam **OR** LORazepam, meclizine, [DISCONTINUED] ondansetron **OR** ondansetron (ZOFRAN) IV, ondansetron, mouth rinse, oxyCODONE, perflutren lipid microspheres (DEFINITY) IV suspension, polyethylene glycol, sodium chloride flush, sorbitol, traZODone   Vital Signs    Vitals:   03/11/22 0845 03/11/22 0915 03/11/22 0930 03/11/22 1000  BP: 131/81 119/88 (!) 136/96 (!) 116/97  Pulse: (!) 110  (!) 118 93 (!) 106  Resp: (!) 32 (!) 25 (!) 28 (!) 28  Temp:      TempSrc:      SpO2: 95% 93% 97% (!) 82%  Weight:      Height:        Intake/Output Summary (Last 24 hours) at 03/11/2022 1040 Last data filed at 03/11/2022 1000 Gross per 24 hour  Intake 2013.03 ml  Output 2800 ml  Net -786.97 ml      03/10/2022    7:00 PM 03/10/2022    4:56 AM 03/09/2022    1:58 PM  Last 3 Weights  Weight (lbs) 174 lb 6.1 oz 172 lb 9.9 oz 169 lb 15.6 oz  Weight (kg) 79.1 kg 78.3 kg 77.1 kg      Telemetry    Atrial fibrillation heart rate currently 100- Personally Reviewed  ECG    A-fib with RVR- Personally Reviewed  Physical Exam   GEN: No acute distress.   Neck: Mid neck JVD Cardiac: Regularly irregular mildly tachycardic, no murmurs, rubs, or gallops.  Respiratory: Mildly increased respiratory effort GI: Soft, nontender, non-distended  MS: No edema; No deformity. Neuro:  Nonfocal  Psych: Normal affect   Labs    High Sensitivity Troponin:   Recent Labs  Lab 03/09/22 1400 03/09/22 1653 03/09/22 1927 03/09/22 2250  TROPONINIHS 181* 150* 143* 118*     Chemistry Recent Labs  Lab 03/09/22 1400 03/09/22 1410 03/10/22 0258 03/10/22 1757 03/11/22 0430  NA 136   < >  138 136 138  K 3.6   < > 3.5 4.0 3.4*  CL 94*   < > 102 104 101  CO2 18*   < > 25 22 27   GLUCOSE 159*   < > 237* 285* 230*  BUN 46*   < > 39* 37* 34*  CREATININE 2.86*   < > 1.35* 1.15 1.12  CALCIUM 9.3   < > 8.6* 8.5* 8.5*  MG 3.4*  --  3.2* 3.1*  --   PROT 8.5*  --  6.8  --  6.8  ALBUMIN 2.4*  --  1.9*  --  1.8*  AST 70*  --  125*  --  100*  ALT 42  --  54*  --  66*  ALKPHOS 96  --  70  --  81  BILITOT 1.0  --  0.6  --  0.4  GFRNONAA 23*   < > 57* >60 >60  ANIONGAP 24*   < > 11 10 10    < > = values in this interval not displayed.    Lipids No results for input(s): "CHOL", "TRIG", "HDL", "LABVLDL", "LDLCALC", "CHOLHDL" in the last 168 hours.  Hematology Recent Labs  Lab 03/10/22 0258 03/10/22 2127  03/11/22 0430  WBC 23.8* 22.2* 19.8*  RBC 3.37* 3.32* 3.15*  HGB 11.3* 11.1* 10.5*  HCT 33.6* 34.0* 32.4*  MCV 99.7 102.4* 102.9*  MCH 33.5 33.4 33.3  MCHC 33.6 32.6 32.4  RDW 12.5 12.7 12.7  PLT 275 291 303   Thyroid  Recent Labs  Lab 03/09/22 1402 03/10/22 0258  TSH 0.195*  --   FREET4  --  1.39*    BNP Recent Labs  Lab 03/09/22 1400 03/11/22 0500  BNP 1,309.2* 586.3*    DDimer No results for input(s): "DDIMER" in the last 168 hours.   Radiology    ECHOCARDIOGRAM COMPLETE  Result Date: 03/11/2022    ECHOCARDIOGRAM REPORT   Patient Name:   Mark [name] Date of Exam: 03/11/2022 Medical Rec #:  [id number]       Height:       74.0 in Accession #:    [id number]      Weight:       174.4 lb Date of Birth:  [birth year]       BSA:          2.050 m Patient Age:    71 years        BP:           131/81 mmHg Patient Gender: M               HR:           117 bpm. Exam Location:  Inpatient Procedure: 2D Echo, Cardiac Doppler, Color Doppler and Intracardiac            Opacification Agent Indications:    Elevated troponin  History:        Patient has no prior history of Echocardiogram examinations.                 Signs/Symptoms:Shortness of Breath. Atrial fibrillation with                 RVR.  Sonographer:    Merrie Roof RDCS Referring Phys: Irine Seal, V IMPRESSIONS  1. Abnormal septal motion . Left ventricular ejection fraction, by estimation, is 50 to 55%. The left ventricle has low normal function. The left ventricle has no regional wall motion abnormalities. Left ventricular diastolic parameters are  indeterminate.  2. Right ventricular systolic function is moderately reduced. The right ventricular size is moderately enlarged.  3. The mitral valve is abnormal. Trivial mitral valve regurgitation. No evidence of mitral stenosis.  4. The aortic valve is tricuspid. There is mild calcification of the aortic valve. There is mild thickening of the aortic valve. Aortic valve regurgitation is not  visualized. Aortic valve sclerosis is present, with no evidence of aortic valve stenosis.  5. The inferior vena cava is dilated in size with >50% respiratory variability, suggesting right atrial pressure of 8 mmHg. FINDINGS  Left Ventricle: Abnormal septal motion. Left ventricular ejection fraction, by estimation, is 50 to 55%. The left ventricle has low normal function. The left ventricle has no regional wall motion abnormalities. Definity contrast agent was given IV to delineate the left ventricular endocardial borders. The left ventricular internal cavity size was normal in size. There is no left ventricular hypertrophy. Left ventricular diastolic parameters are indeterminate. Right Ventricle: The right ventricular size is moderately enlarged. No increase in right ventricular wall thickness. Right ventricular systolic function is moderately reduced. Left Atrium: Left atrial size was normal in size. Right Atrium: Right atrial size was normal in size. Pericardium: There is no evidence of pericardial effusion. Mitral Valve: The mitral valve is abnormal. There is mild thickening of the mitral valve leaflet(s). There is mild calcification of the mitral valve leaflet(s). Mild mitral annular calcification. Trivial mitral valve regurgitation. No evidence of mitral valve stenosis. Tricuspid Valve: The tricuspid valve is normal in structure. Tricuspid valve regurgitation is mild . No evidence of tricuspid stenosis. Aortic Valve: The aortic valve is tricuspid. There is mild calcification of the aortic valve. There is mild thickening of the aortic valve. Aortic valve regurgitation is not visualized. Aortic valve sclerosis is present, with no evidence of aortic valve stenosis. Pulmonic Valve: The pulmonic valve was normal in structure. Pulmonic valve regurgitation is not visualized. No evidence of pulmonic stenosis. Aorta: The aortic root is normal in size and structure. Venous: The inferior vena cava is dilated in size with  greater than 50% respiratory variability, suggesting right atrial pressure of 8 mmHg. IAS/Shunts: No atrial level shunt detected by color flow Doppler.  LEFT VENTRICLE PLAX 2D LVIDd:         4.10 cm LVIDs:         3.00 cm LV PW:         1.10 cm LV IVS:        1.10 cm LVOT diam:     2.20 cm LV SV:         51 LV SV Index:   25 LVOT Area:     3.80 cm  RIGHT VENTRICLE            IVC RV Basal diam:  5.00 cm    IVC diam: 2.70 cm RV Mid diam:    3.80 cm RV S prime:     7.95 cm/s TAPSE (M-mode): 1.3 cm LEFT ATRIUM             Index        RIGHT ATRIUM           Index LA diam:        3.70 cm 1.80 cm/m   RA Area:     22.80 cm LA Vol (A2C):   79.2 ml 38.63 ml/m  RA Volume:   92.00 ml  44.87 ml/m LA Vol (A4C):   56.0 ml 27.31 ml/m LA Biplane Vol: 68.7 ml 33.50  ml/m  AORTIC VALVE LVOT Vmax:   94.20 cm/s LVOT Vmean:  59.700 cm/s LVOT VTI:    0.135 m  AORTA Ao Root diam: 2.90 cm  SHUNTS Systemic VTI:  0.14 m Systemic Diam: 2.20 cm Jenkins Rouge MD Electronically signed by Jenkins Rouge MD Signature Date/Time: 03/11/2022/9:21:28 AM    Final    DG CHEST PORT 1 VIEW  Result Date: 03/10/2022 CLINICAL DATA:  Central line placement. EXAM: PORTABLE CHEST 1 VIEW COMPARISON:  Chest radiograph dated 03/09/2022. FINDINGS: Right-sided PICC with tip over central SVC. No pneumothorax. Right upper lobe opacities similar or slightly progressed since the prior radiograph. Left lung is clear. No pleural effusion. The cardiac silhouette is within normal limits. No acute osseous pathology. IMPRESSION: 1. Right-sided PICC with tip over central SVC. No pneumothorax. 2. Right upper lobe opacities similar or slightly progressed since the prior radiograph. Electronically Signed   By: Anner Crete M.D.   On: 03/10/2022 19:38   [place] EKG SITE RITE  Result Date: 03/10/2022 If Site Rite image not attached, placement could not be confirmed due to current cardiac rhythm.  US RENAL  Result Date: 03/10/2022 CLINICAL DATA:  Acute renal failure. EXAM:  RENAL / URINARY TRACT ULTRASOUND COMPLETE COMPARISON:  None Available. FINDINGS: Right Kidney: Renal measurements: 11.6 x 4.3 x 6.1 cm = volume: 161.4 mL. Echogenicity within normal limits. No mass or hydronephrosis visualized. Left Kidney: Renal measurements: 11.2 x 6.1 x 6.0 cm = volume: 215.7 mL. Echogenicity within normal limits. No mass or hydronephrosis visualized. Bladder: Appears normal for degree of bladder distention. Other: None. IMPRESSION: Normal renal ultrasound. Electronically Signed   By: Lajean Manes M.D.   On: 03/10/2022 11:57   CT Head Wo Contrast  Result Date: 03/09/2022 CLINICAL DATA:  Head trauma altered EXAM: CT HEAD WITHOUT CONTRAST TECHNIQUE: Contiguous axial images were obtained from the base of the skull through the vertex without intravenous contrast. RADIATION DOSE REDUCTION: This exam was performed according to the departmental dose-optimization program which includes automated exposure control, adjustment of the mA and/or kV according to patient size and/or use of iterative reconstruction technique. COMPARISON:  Brain MRI 11/10/2019, CT brain 11/10/2019 FINDINGS: Brain: No acute territorial infarction, hemorrhage or intracranial mass. Atrophy and chronic small vessel ischemic changes of the white matter. Stable ventricle size Vascular: No hyperdense vessels.  Carotid vascular calcification Skull: Normal. Negative for fracture or focal lesion. Sinuses/Orbits: No acute finding. Other: None IMPRESSION: 1. No CT evidence for acute intracranial abnormality. 2. Atrophy and chronic small vessel ischemic changes of the white matter. Electronically Signed   By: Donavan Foil M.D.   On: 03/09/2022 16:09   DG Chest Port 1 View  Result Date: 03/09/2022 CLINICAL DATA:  Respiratory distress EXAM: PORTABLE CHEST 1 VIEW COMPARISON:  11/10/2019 FINDINGS: The heart size and mediastinal contours are within normal limits. Extensive heterogeneous airspace opacity of the right upper lobe. The  visualized skeletal structures are unremarkable. IMPRESSION: Extensive heterogeneous airspace opacity of the right upper lobe, most consistent with infection. Recommend follow-up radiographs in 6-8 weeks to ensure complete radiographic resolution and exclude underlying mass. Electronically Signed   By: Delanna Ahmadi M.D.   On: 03/09/2022 14:21    Cardiac Studies   ECHO 03/11/22    1. Abnormal septal motion . Left ventricular ejection fraction, by  estimation, is 50 to 55%. The left ventricle has low normal function. The  left ventricle has no regional wall motion abnormalities. Left ventricular  diastolic parameters are  indeterminate.  2. Right ventricular systolic function is moderately reduced. The right  ventricular size is moderately enlarged.   3. The mitral valve is abnormal. Trivial mitral valve regurgitation. No  evidence of mitral stenosis.   4. The aortic valve is tricuspid. There is mild calcification of the  aortic valve. There is mild thickening of the aortic valve. Aortic valve  regurgitation is not visualized. Aortic valve sclerosis is present, with  no evidence of aortic valve stenosis.   5. The inferior vena cava is dilated in size with >50% respiratory  variability, suggesting right atrial pressure of 8 mmHg.   Patient Profile     72 y.o. male with A-fib RVR, sepsis, alcohol use, smoker  Assessment & Plan    A-fib with RVR Acute diastolic heart failure - Yesterday diltiazem drip as well as heparin drip was started.  Eventually required CCU/2H stay because of worsening shortness of breath.  Was placed on BiPAP transiently.  Was given 100 mg of IV Lasix x1 with 2.8 L over the past 24 hours.  An additional dose of 40 mg of Lasix given today as well. -Prior heart rates were as high as 182 on 03/10/2022 driven by pneumonia, sepsis.  EF 55%, elevated free T4.  Trying to avoid amiodarone.  Continuing with Cardizem drip.  Continuing heparin IV.  Elevated troponin, demand  ischemia - Likely secondary to sepsis, atrial fibrillation with rapid ventricular response -Continue with supportive care.  Does not require cardiac rehab etc.      For questions or updates, please contact [place] Please consult [email or web address] for contact info under        Signed, Candee Furbish, MD  03/11/2022, 10:40 AM

## 2022-03-11 NOTE — Progress Notes (Signed)
ANTICOAGULATION CONSULT NOTE - Follow up Consult  Pharmacy Consult for heparin  Indication: atrial fibrillation  No Known Allergies  Patient Measurements: Height: 6\' 2"  (188 cm) Weight: 79.1 kg (174 lb 6.1 oz) IBW/kg (Calculated) : 82.2 Heparin Dosing Weight: 77kg  Vital Signs: Temp: 98 F (36.7 C) (09/03 1930) Temp Source: Oral (09/03 1930) BP: 145/66 (09/03 2100) Pulse Rate: 93 (09/03 2100)  Labs: Recent Labs    03/09/22 1400 03/09/22 1410 03/09/22 1653 03/09/22 1927 03/09/22 2250 03/10/22 0258 03/10/22 1757 03/10/22 2108 03/10/22 2127 03/11/22 0430 03/11/22 0500 03/11/22 1250 03/11/22 2131  HGB 13.4   < >  --   --   --  11.3*  --   --  11.1* 10.5*  --   --   --   HCT 40.4   < >  --   --   --  33.6*  --   --  34.0* 32.4*  --   --   --   PLT 368  --   --   --   --  275  --   --  291 303  --   --   --   LABPROT 16.0*  --   --   --   --   --   --   --   --   --   --   --   --   INR 1.3*  --   --   --   --   --   --   --   --   --   --   --   --   HEPARINUNFRC  --   --   --   --   --   --   --    < >  --   --  0.19* 0.40 0.28*  CREATININE 2.86*  --   --  2.04*  --  1.35* 1.15  --   --  1.12  --   --   --   CKTOTAL 173  --   --   --   --   --   --   --   --   --   --   --   --   TROPONINIHS 181*  --  150* 143* 118*  --   --   --   --   --   --   --   --    < > = values in this interval not displayed.     Estimated Creatinine Clearance: 69.6 mL/min (by C-G formula based on SCr of 1.12 mg/dL).   Medical History: Past Medical History:  Diagnosis Date   Allergic rhinitis    Asthma    BPH (benign prostatic hyperplasia)    Carpal tunnel syndrome    Hypertension     Medications:  Scheduled:   budesonide (PULMICORT) nebulizer solution  0.5 mg Nebulization BID   Chlorhexidine Gluconate Cloth  6 each Topical Daily   folic acid  1 mg Intravenous Daily   guaiFENesin  1,200 mg Oral BID   insulin aspart  0-15 Units Subcutaneous Q4H   [START ON 03/12/2022] ipratropium   0.5 mg Nebulization TID   [START ON 03/12/2022] levalbuterol  0.63 mg Nebulization TID   loratadine  10 mg Oral Daily   LORazepam  0-4 mg Intravenous Q4H   Followed by   05/12/2022 ON 03/12/2022] LORazepam  0-4 mg Intravenous Q8H   methylPREDNISolone (SOLU-MEDROL) injection  40 mg Intravenous Daily  multivitamin with minerals  1 tablet Oral Daily   nicotine  7 mg Transdermal Daily   mouth rinse  15 mL Mouth Rinse 4 times per day   pantoprazole  40 mg Oral BID AC   sodium chloride flush  10-40 mL Intracatheter Q12H   sodium chloride flush  3 mL Intravenous Q12H   thiamine  100 mg Oral Daily   Or   thiamine  100 mg Intravenous Daily   Infusions:   azithromycin Stopped (03/11/22 1736)   cefTRIAXone (ROCEPHIN)  IV Stopped (03/11/22 1602)   diltiazem (CARDIZEM) infusion 15 mg/hr (03/11/22 2147)   heparin 1,500 Units/hr (03/11/22 1900)    Assessment: 71 YO male presenting 9/1 with sepsis due to pneumonia. Currently in Afib--no [facility] PTA. Troponin and BNP both elevated likely due to sepsis and AKI.   Heparin level  0.28 on 1500 units/hr.   Goal of Therapy:  Heparin level 0.3-0.7 units/ml Monitor platelets by anticoagulation protocol: Yes   Plan:  -Increase heparin to 1600 units/hr -Heparin level and CBC in am  Harland German, PharmD Clinical Pharmacist **Pharmacist phone directory can now be found on [email or web address] ([email or web address]).  Listed under [facility].  e

## 2022-03-12 ENCOUNTER — Inpatient Hospital Stay (HOSPITAL_COMMUNITY): Payer: Medicare HMO

## 2022-03-12 DIAGNOSIS — J9601 Acute respiratory failure with hypoxia: Secondary | ICD-10-CM | POA: Diagnosis not present

## 2022-03-12 DIAGNOSIS — J45901 Unspecified asthma with (acute) exacerbation: Secondary | ICD-10-CM | POA: Diagnosis not present

## 2022-03-12 DIAGNOSIS — N179 Acute kidney failure, unspecified: Secondary | ICD-10-CM | POA: Diagnosis not present

## 2022-03-12 DIAGNOSIS — R7989 Other specified abnormal findings of blood chemistry: Secondary | ICD-10-CM | POA: Diagnosis not present

## 2022-03-12 LAB — HEPATIC FUNCTION PANEL
ALT: 67 U/L — ABNORMAL HIGH (ref 0–44)
AST: 75 U/L — ABNORMAL HIGH (ref 15–41)
Albumin: 1.8 g/dL — ABNORMAL LOW (ref 3.5–5.0)
Alkaline Phosphatase: 91 U/L (ref 38–126)
Bilirubin, Direct: 0.1 mg/dL (ref 0.0–0.2)
Indirect Bilirubin: 0.3 mg/dL (ref 0.3–0.9)
Total Bilirubin: 0.4 mg/dL (ref 0.3–1.2)
Total Protein: 6.9 g/dL (ref 6.5–8.1)

## 2022-03-12 LAB — HEPARIN LEVEL (UNFRACTIONATED): Heparin Unfractionated: 0.24 IU/mL — ABNORMAL LOW (ref 0.30–0.70)

## 2022-03-12 LAB — BASIC METABOLIC PANEL
Anion gap: 6 (ref 5–15)
BUN: 34 mg/dL — ABNORMAL HIGH (ref 8–23)
CO2: 29 mmol/L (ref 22–32)
Calcium: 8.7 mg/dL — ABNORMAL LOW (ref 8.9–10.3)
Chloride: 103 mmol/L (ref 98–111)
Creatinine, Ser: 0.94 mg/dL (ref 0.61–1.24)
GFR, Estimated: >60 mL/min (ref >60)
Glucose, Bld: 161 mg/dL — ABNORMAL HIGH (ref 70–99)
Potassium: 4.3 mmol/L (ref 3.5–5.1)
Sodium: 138 mmol/L (ref 135–145)

## 2022-03-12 LAB — CBC WITH DIFFERENTIAL/PLATELET
Abs Immature Granulocytes: 0.37 10*3/uL — ABNORMAL HIGH (ref 0.00–0.07)
Basophils Absolute: 0.1 10*3/uL (ref 0.0–0.1)
Basophils Relative: 0 %
Eosinophils Absolute: 0 10*3/uL (ref 0.0–0.5)
Eosinophils Relative: 0 %
HCT: 31.6 % — ABNORMAL LOW (ref 39.0–52.0)
Hemoglobin: 10.3 g/dL — ABNORMAL LOW (ref 13.0–17.0)
Immature Granulocytes: 2 %
Lymphocytes Relative: 4 %
Lymphs Abs: 0.9 10*3/uL (ref 0.7–4.0)
MCH: 33.6 pg (ref 26.0–34.0)
MCHC: 32.6 g/dL (ref 30.0–36.0)
MCV: 102.9 fL — ABNORMAL HIGH (ref 80.0–100.0)
Monocytes Absolute: 1 10*3/uL (ref 0.1–1.0)
Monocytes Relative: 5 %
Neutro Abs: 18.8 10*3/uL — ABNORMAL HIGH (ref 1.7–7.7)
Neutrophils Relative %: 89 %
Platelets: 336 10*3/uL (ref 150–400)
RBC: 3.07 MIL/uL — ABNORMAL LOW (ref 4.22–5.81)
RDW: 13 % (ref 11.5–15.5)
WBC: 21.1 10*3/uL — ABNORMAL HIGH (ref 4.0–10.5)
nRBC: 0.2 % (ref 0.0–0.2)

## 2022-03-12 LAB — T3: T3, Total: 79 ng/dL (ref 71–180)

## 2022-03-12 LAB — BRAIN NATRIURETIC PEPTIDE: B Natriuretic Peptide: 642.5 pg/mL — ABNORMAL HIGH (ref 0.0–100.0)

## 2022-03-12 LAB — GLUCOSE, CAPILLARY
Glucose-Capillary: 141 mg/dL — ABNORMAL HIGH (ref 70–99)
Glucose-Capillary: 149 mg/dL — ABNORMAL HIGH (ref 70–99)
Glucose-Capillary: 150 mg/dL — ABNORMAL HIGH (ref 70–99)
Glucose-Capillary: 151 mg/dL — ABNORMAL HIGH (ref 70–99)
Glucose-Capillary: 181 mg/dL — ABNORMAL HIGH (ref 70–99)
Glucose-Capillary: 205 mg/dL — ABNORMAL HIGH (ref 70–99)

## 2022-03-12 LAB — MAGNESIUM: Magnesium: 3.2 mg/dL — ABNORMAL HIGH (ref 1.7–2.4)

## 2022-03-12 MED ORDER — LEVALBUTEROL HCL 0.63 MG/3ML IN NEBU
0.6300 mg | INHALATION_SOLUTION | Freq: Four times a day (QID) | RESPIRATORY_TRACT | Status: DC
  Administered 2022-03-12 – 2022-03-15 (×11): 0.63 mg via RESPIRATORY_TRACT
  Filled 2022-03-12 (×13): qty 3

## 2022-03-12 MED ORDER — METHYLPREDNISOLONE SODIUM SUCC 125 MG IJ SOLR
60.0000 mg | Freq: Three times a day (TID) | INTRAMUSCULAR | Status: DC
  Administered 2022-03-12 – 2022-03-14 (×7): 60 mg via INTRAVENOUS
  Filled 2022-03-12 (×7): qty 2

## 2022-03-12 MED ORDER — FUROSEMIDE 20 MG PO TABS
20.0000 mg | ORAL_TABLET | Freq: Two times a day (BID) | ORAL | Status: DC
  Administered 2022-03-12 – 2022-03-17 (×11): 20 mg via ORAL
  Filled 2022-03-12 (×11): qty 1

## 2022-03-12 MED ORDER — DILTIAZEM HCL ER COATED BEADS 120 MG PO CP24
120.0000 mg | ORAL_CAPSULE | Freq: Every day | ORAL | Status: DC
  Administered 2022-03-12: 120 mg via ORAL
  Filled 2022-03-12 (×2): qty 1

## 2022-03-12 MED ORDER — EMPAGLIFLOZIN 10 MG PO TABS
10.0000 mg | ORAL_TABLET | Freq: Every day | ORAL | Status: DC
Start: 2022-03-12 — End: 2022-03-23
  Administered 2022-03-12 – 2022-03-23 (×12): 10 mg via ORAL
  Filled 2022-03-12 (×12): qty 1

## 2022-03-12 MED ORDER — APIXABAN 5 MG PO TABS
5.0000 mg | ORAL_TABLET | Freq: Two times a day (BID) | ORAL | Status: DC
  Administered 2022-03-12 – 2022-03-23 (×23): 5 mg via ORAL
  Filled 2022-03-12 (×23): qty 1

## 2022-03-12 MED ORDER — IPRATROPIUM BROMIDE 0.02 % IN SOLN
0.5000 mg | Freq: Four times a day (QID) | RESPIRATORY_TRACT | Status: DC
  Administered 2022-03-12 – 2022-03-15 (×11): 0.5 mg via RESPIRATORY_TRACT
  Filled 2022-03-12 (×13): qty 2.5

## 2022-03-12 NOTE — Progress Notes (Signed)
Physical Therapy Treatment Patient Details Name: Mark [name] MRN: [id number] DOB: 12-30-1950 Today's Date: 03/12/2022   History of Present Illness 71 y/o male admitted 03/09/22 for respiratory distress, chest pain, and diarrhea. Pt with sepsis due to PNA. 9/2 respiratory distress requiring bipap with transfer to ICU and Afib with RVR. PMHx: asthma, ETOH dependence, HTN, COPD    PT Comments    Pt with cognitive and functional decline from eval. Pt stating he lives alone has been staying on the couch for a month unable to always get to the bathroom, doesn't cook or shop and microwaves meals. Today pt stating month as Feb and unaware he is in the hospital. Pt with assist for all mobility and unable to take significant steps  during mobility with HR 120 with activity and SPO2 85% on 4L with 1 min seated recovery for HR 110 and SpO2 92%. Pt reports he does not have assist at home and based on today's presentation would not be able to return home alone. Will continue to follow and RN aware of all above.     Recommendations for follow up therapy are one component of a multi-disciplinary discharge planning process, led by the attending physician.  Recommendations may be updated based on patient status, additional functional criteria and insurance authorization.  Follow Up Recommendations  Skilled nursing-short term rehab (<3 hours/day) Can patient physically be transported by private vehicle: No   Assistance Recommended at Discharge Frequent or constant Supervision/Assistance  Patient can return home with the following A lot of help with walking and/or transfers;Assistance with cooking/housework;Assist for transportation;A lot of help with bathing/dressing/bathroom;Direct supervision/assist for medications management;Direct supervision/assist for financial management   Equipment Recommendations  Rolling walker (2 wheels);BSC/3in1    Recommendations for Other Services       Precautions /  Restrictions Precautions Precautions: Fall Precaution Comments: watch HR and O2     Mobility  Bed Mobility Overal bed mobility: Needs Assistance Bed Mobility: Supine to Sit     Supine to sit: Min assist, HOB elevated     General bed mobility comments: HOB 30 degrees, increased time with min assist to advance bil LE to EOB    Transfers Overall transfer level: Needs assistance   Transfers: Sit to/from Stand, Bed to chair/wheelchair/BSC Sit to Stand: Min assist Stand pivot transfers: Mod assist         General transfer comment: min assist to rise from surface with cues for hand placement. Pt weight shifting in standing but not stepping, required mod assist to physically step to pivot to chair with RW present    Ambulation/Gait               General Gait Details: unable   Stairs             Wheelchair Mobility    Modified Rankin (Stroke Patients Only)       Balance Overall balance assessment: Needs assistance Sitting-balance support: Bilateral upper extremity supported, Feet supported Sitting balance-Leahy Scale: Fair     Standing balance support: Bilateral upper extremity supported Standing balance-Leahy Scale: Poor Standing balance comment: bil UE on RW in standing                            Cognition Arousal/Alertness: Awake/alert Behavior During Therapy: Flat affect Overall Cognitive Status: Impaired/Different from baseline Area of Impairment: Orientation, Attention, Memory, Following commands, Safety/judgement, Problem solving  Orientation Level: Disoriented to, Time, Place, Situation Current Attention Level: Focused Memory: Decreased short-term memory Following Commands: Follows one step commands inconsistently, Follows one step commands with increased time Safety/Judgement: Decreased awareness of deficits, Decreased awareness of safety   Problem Solving: Slow processing, Decreased initiation, Difficulty  sequencing, Requires verbal cues, Requires tactile cues          Exercises General Exercises - Lower Extremity Long Arc Quad: AROM, Both, 10 reps, Seated Hip Flexion/Marching: AAROM, Both, 10 reps, Seated    General Comments        Pertinent Vitals/Pain Pain Assessment Pain Assessment: 0-10 Pain Score: 6  Pain Location: left leg Pain Descriptors / Indicators: Aching, Guarding Pain Intervention(s): Limited activity within patient's tolerance, Monitored during session, Repositioned    Home Living                          Prior Function            PT Goals (current goals can now be found in the care plan section) Progress towards PT goals: Not progressing toward goals - comment    Frequency    Min 3X/week      PT Plan Discharge plan needs to be updated    Co-evaluation              AM-PAC PT "6 Clicks" Mobility   Outcome Measure  Help needed turning from your back to your side while in a flat bed without using bedrails?: A Little Help needed moving from lying on your back to sitting on the side of a flat bed without using bedrails?: A Little Help needed moving to and from a bed to a chair (including a wheelchair)?: A Lot Help needed standing up from a chair using your arms (e.g., wheelchair or bedside chair)?: A Little Help needed to walk in hospital room?: Total Help needed climbing 3-5 steps with a railing? : Total 6 Click Score: 13    End of Session Equipment Utilized During Treatment: Oxygen Activity Tolerance: Patient limited by fatigue Patient left: in chair;with call bell/phone within reach;with chair alarm set;with nursing/sitter in room Nurse Communication: Mobility status PT Visit Diagnosis: Unsteadiness on feet (R26.81);Muscle weakness (generalized) (M62.81);Difficulty in walking, not elsewhere classified (R26.2);Other abnormalities of gait and mobility (R26.89)     Time: 1204-1229 PT Time Calculation (min) (ACUTE ONLY): 25  min  Charges:  $Therapeutic Exercise: 8-22 mins $Therapeutic Activity: 8-22 mins                     Merryl Hacker, PT Acute Rehabilitation Services Office: [phone]    Enedina Finner Anterio Scheel 03/12/2022, 2:08 PM

## 2022-03-12 NOTE — Progress Notes (Signed)
PROGRESS NOTE    Mark [name]  ZOX:[id number] DOB: December 29, 1950 DOA: 03/09/2022 PCP: Cipriano Mile, NP    No chief complaint on file.   Brief Narrative:  Patient 71 year old gentleman history of asthma, alcohol dependence, hypertension, BPH, allergic rhinitis presented to the ED with 4-day history of productive cough brownish sputum, acute respiratory distress requiring BiPAP.  Patient noted on work-up to be septic with concern for right upper lobe pneumonia, patient in acute respiratory distress concern for acute asthma exacerbation.  Patient noted to go into atrial fibrillation with RVR this morning 03/10/2022.  PCCM cardiology following.   Assessment & Plan:   Principal Problem:   Acute respiratory failure with hypoxia (HCC) Active Problems:   Sepsis due to pneumonia ([name])   Asthma exacerbation   Transaminitis   Elevated troponin   EtOH dependence (HCC)   Tobacco abuse   PNA (pneumonia)   ARF (acute renal failure) (HCC)   Dehydration   Abnormal TSH   Chronic alcohol abuse   Severe persistent asthma with acute exacerbation   AKI (acute kidney injury) ([place])   Severe sepsis (HCC)   Atrial fibrillation with rapid ventricular response (HCC)   Hypervolemia  #1 acute respiratory failure with hypoxia secondary to right upper lobe pneumonia and acute asthma exacerbation/COPD exacerbation, concern for volume overload/CHF exacerbation. -Likely multifactorial secondary to right upper lobe pneumonia in the setting of an acute asthma exacerbation/COPD exacerbation and concern for volume overload/CHF exacerbation. -On admission patient was brought in on CPAP, had received IM epinephrine, 2 g of mag sulfate, 125 mg of Solu-Medrol, 3 nebulizer treatments in the field. -Patient transition to BiPAP. -Chest x-ray done concerning for right upper lobe pneumonia. -Patient noted on admission to be tachypneic, tachycardic, noted to have a significant leukocytosis, concerning for sepsis. -Blood  cultures obtained and pending. -Urinalysis nitrite negative leukocytes negative. -Urine strep pneumococcus antigen negative. -MRSA PCR negative. -Respiratory viral panel negative. -Urine Legionella antigen pending.   -Sputum Gram stain and culture pending. -Lactic acid level elevated and trending down. -Continue empiric IV Rocephin, IV azithromycin, Claritin, PPI, Pulmicort, Atrovent and Xopenex nebs, IV Solu-Medrol. -Patient noted to have gone into worsening respiratory distress the afternoon of 03/10/2022, transferred to the ICU, seen by critical care and concerns for also possible volume overload. -Patient placed back on the BiPAP which was subsequently removed the evening of 03/10/2022, due to nausea currently on 3-4 L nasal cannula with sats in the mid 90s. -Patient given Lasix 100 mg IV x1.  PCCM on 03/10/2022 with a urine output of 2.8 L. -Patient received Lasix 40 mg IV x1 on 03/11/2022 with urine output of 1.6 L. -Patient is still tight on exam, poor air movement, some diffuse wheezing and as such we will increase Solu-Medrol back to 60 mg IV every 8 hours.  Check a chest x-ray.  Check a BNP. -May need dose of IV Lasix for the next 24 hours. -We will have PCCM reassessed patient again today for further evaluation and management.   2.  Sepsis secondary to pneumonia, POA -Patient on admission met criteria for sepsis with tachycardia, tachypnea, significant leukocytosis, lactic acidosis, acute respiratory distress, chest x-ray consistent with a right upper lobe pneumonia. -Blood cultures ordered and pending. -MRSA PCR negative. -Respiratory viral panel negative. -Urinalysis nitrite negative leukocytes negative. -Lactic acid elevated at 7.6 on admission>>> 4.6>>> 2.1 >>> 1.8 -Urine pneumococcus antigen negative. -Urine Legionella antigen pending. -Sepsis protocol underway in the ED. -Continue empiric IV Rocephin, IV azithromycin, Mucinex, neb treatments.   -  PCCM following and I appreciate  their input and recommendations.    3.  New onset atrial fibrillation -Patient noted to go into A-fib with RVR with heart rate sustaining greater than the 140s as high as 182 on 03/10/2022. -Likely secondary to sepsis, acute pulmonary issues.  Patient also noted to have an abnormal TSH. -Cardiac enzymes cycled were elevated but trending down (demand ischemia possibly). -2D echo with EF of 50 to 55%, NWMA, left ventricular diastolic parameters indeterminate, moderately reduced right ventricular systolic function, moderately enlarged right ventricular size, trivial MVR, no evidence of mitral stenosis.  Aortic valve sclerosis.  -TSH abnormal.  Elevated free T4. -Due to abnormal TSH we will likely try to avoid amiodarone. -Currently on Cardizem drip and being transitioned to oral Cardizem per cardiology.   -Was on IV heparin and being transitioned to Eliquis per cardiology.  -Cardiology following and appreciate input and recommendations.  #4: Volume overload/acute systolic and diastolic CHF exacerbation -Patient noted to have worsening respiratory status on 03/10/2022, seen by PCCM and felt to be in volume overload.  Patient noted to have an elevated BNP. - ?  Secondary to new onset A-fib versus aggressive fluid resuscitation on admission with sepsis. -Patient given Lasix 100 mg IV x1 on 03/10/2022 with a urine output of 2.8 L. -Patient given a dose of Lasix 40 mg IV x1 on 03/11/2022 with a urine output of 1.6 L over the past 24 hours. -Cardiac enzymes elevated but trended down, 2D echo with EF of 50 to 55%, NWMA, left ventricular diastolic parameters indeterminate, moderately reduced right ventricular systolic function, moderately enlarged right ventricular size, trivial MVR, no evidence of mitral stenosis.  Aortic valve sclerosis.  -Patient with some diffuse wheezing, poor air movement, chest tightness today. -Repeat chest x-ray, check a BNP. -May need another day of IV Lasix. -Patient seen by cardiology  and placed on oral Lasix 20 mg twice daily. -Cardiology following.   5.  Elevated troponin -Likely demand ischemia secondary to problem #1 and 2. -Serial troponins trending down.   -2D echo with EF of 50 to 55%, NWMA, left ventricular diastolic parameters indeterminate, moderately reduced right ventricular systolic function, moderately enlarged right ventricular size, trivial MVR, no evidence of mitral stenosis.  Aortic valve sclerosis.  -Continue aspirin.   -Patient now in A-fib with RVR and currently on Cardizem drip. -Heart rate improving, being transitioned to oral Cardizem per cardiology. -Cardiology consulted and following and appreciate input and recommendations.  -Supportive care.   6.  Hypertension -Patient currently in A-fib and as such IV Lopressor and Norvasc have been discontinued.   -Currently on Cardizem drip and then transition to oral Cardizem per cardiology..    7.  History of alcohol use -Alcohol level noted to be < 10.  -Patient with no significant withdrawal noted on examination, except some minimal tremors noted.  -Due to somnolence on 03/10/2022, Librium detox protocol discontinued by PCCM. -Continue the Ativan stepdown CIWA protocol as needed. -Continue thiamine, multivitamin, folic acid.   8.  Tobacco dependence -Tobacco cessation. -Nicotine patch.   9.  Transaminitis -Likely secondary to history of alcohol use. -Improving. -Follow.   10.  Acute renal failure -Likely secondary to prerenal azotemia in the setting of HCTZ, ARB. -Urinalysis nitrite negative, leukocytes negative, 30 of protein, 0-5 WBCs.  Urine sodium at 32, urine creatinine at 144.   -Urine output with 1.64 L over the past 24 hours after receiving Lasix 40 mg IV x1 on 03/11/2022.  Patient also received Lasix  at 100 mg IV x1 on 03/10/2022.   -Renal function improving daily currently at 0.94. -Renal ultrasound unremarkable.   11.  Dehydration -IV fluids saline locked yesterday due to concerns  for volume overload per PCCM.   12.  Abnormal TSH -TSH noted at 0.195.   -Free T4 of 1.39.   -Free T3 at 1.6. -Total T3 at 79. Thyroid-stimulating immunoglobin/thyroid antibodies pendind -Patient noted now in atrial fibrillation.   -Currently on Cardizem drip for rate control and being weaned off to oral Cardizem. -We will need outpatient follow-up with PCP for repeat TFTs and depending on results may need outpatient referral to endocrinology.  13.  Anemia/folate deficiency -Patient denies any overt bleeding however per RN it was noted that patient had some dark stool the evening of 03/10/2022.. -Hemoglobin stable at 10.3. -FOBT ordered and pending.  Check FOBT. -Anemia panel consistent with anemia of chronic disease.  Folate level at 4.7.   -Continue folic acid daily.  -Follow H&H. -Transfusion threshold hemoglobin < 7   DVT prophylaxis: Heparin >>>> Eliquis Code Status: Full Family Communication: Updated patient.  No family at bedside. Disposition: TBD  Status is: Inpatient Remains inpatient appropriate because: Severity of illness   Consultants:  Cardiology: Dr. Harrell Gave 03/09/2022 PCCM: Dr. Chase Caller 03/09/2022  Procedures:  CT head 03/09/2022 Chest x-ray 03/09/2022 Renal ultrasound 03/10/2022 2D echo 03/11/2022 PICC line 03/10/2022  Antimicrobials:  IV Rocephin 03/09/2022>>>>> IV azithromycin 03/09/2022   Subjective: Patient sitting up in bed.  States no significant change with shortness of breath over the past 24 hours.  Noted to be somewhat tachypneic with respiratory rates in the high 20s to 30s.  Some chest pain with coughing.  Still with a productive cough.   Objective: Vitals:   03/12/22 0845 03/12/22 0900 03/12/22 0915 03/12/22 0930  BP: (!) 125/95 (!) 140/95 132/75 (!) 143/67  Pulse: 94 86 93 97  Resp: (!) 30 (!) 23 (!) 28 (!) 22  Temp:      TempSrc:      SpO2: 90% 91% 92% 93%  Weight:      Height:        Intake/Output Summary (Last 24 hours) at 03/12/2022  1000 Last data filed at 03/12/2022 0900 Gross per 24 hour  Intake 2386.43 ml  Output 1790 ml  Net 596.43 ml    Filed Weights   03/10/22 0456 03/10/22 1900 03/12/22 0500  Weight: 78.3 kg 79.1 kg 80.2 kg    Examination:  General exam: Laying in bed, on 3L nasal cannula with sats of 94%, heart rate in the low 100s.  Minimal tremors noted. Respiratory system: Poor air movement.  Tight.  Some diffuse wheezing.  Tachypnea.   Cardiovascular system: Irregularly irregular.  No murmurs rubs or gallops.  No lower extremity edema. ?  Positive JVD Gastrointestinal system: Abdomen is soft, nontender, nondistended, positive bowel sounds.  No rebound.  No guarding. Central nervous system: Alert and oriented. No focal neurological deficits.  Moving extremities spontaneously. Extremities: Symmetric 5 x 5 power. Skin: No rashes, lesions or ulcers Psychiatry: Judgement and insight appear normal. Mood & affect appropriate.     Data Reviewed: I have personally reviewed following labs and imaging studies  CBC: Recent Labs  Lab 03/09/22 1400 03/09/22 1410 03/10/22 0258 03/10/22 2127 03/11/22 0430 03/12/22 0446  WBC 34.3*  --  23.8* 22.2* 19.8* 21.1*  NEUTROABS 29.8*  --  23.1*  --  18.6* 18.8*  HGB 13.4 14.3 11.3* 11.1* 10.5* 10.3*  HCT 40.4 42.0 33.6*  34.0* 32.4* 31.6*  MCV 102.0*  --  99.7 102.4* 102.9* 102.9*  PLT 368  --  275 291 303 336     Basic Metabolic Panel: Recent Labs  Lab 03/09/22 1400 03/09/22 1410 03/09/22 1927 03/10/22 0258 03/10/22 1757 03/11/22 0430 03/12/22 0446  NA 136   < > 136 138 136 138 138  K 3.6   < > 3.8 3.5 4.0 3.4* 4.3  CL 94*  --  97* 102 104 101 103  CO2 18*  --  20* '25 22 27 29  ' GLUCOSE 159*  --  229* 237* 285* 230* 161*  BUN 46*  --  47* 39* 37* 34* 34*  CREATININE 2.86*  --  2.04* 1.35* 1.15 1.12 0.94  CALCIUM 9.3  --  8.6* 8.6* 8.5* 8.5* 8.7*  MG 3.4*  --   --  3.2* 3.1*  --  3.2*  PHOS  --   --   --  4.3 3.1  --   --    < > = values in this  interval not displayed.     GFR: Estimated Creatinine Clearance: 84.1 mL/min (by C-G formula based on SCr of 0.94 mg/dL).  Liver Function Tests: Recent Labs  Lab 03/09/22 1400 03/10/22 0258 03/11/22 0430 03/12/22 0446  AST 70* 125* 100* 75*  ALT 42 54* 66* 67*  ALKPHOS 96 70 81 91  BILITOT 1.0 0.6 0.4 0.4  PROT 8.5* 6.8 6.8 6.9  ALBUMIN 2.4* 1.9* 1.8* 1.8*     CBG: Recent Labs  Lab 03/11/22 1556 03/11/22 1928 03/11/22 2337 03/12/22 0346 03/12/22 0751  GLUCAP 154* 263* 175* 150* 149*      Recent Results (from the past 240 hour(s))  Blood culture (routine x 2)     Status: None (Preliminary result)   Collection Time: 03/09/22  2:00 PM   Specimen: BLOOD  Result Value Ref Range Status   Specimen Description BLOOD SITE NOT SPECIFIED  Final   Special Requests   Final    BOTTLES DRAWN AEROBIC AND ANAEROBIC Blood Culture adequate volume   Culture   Final    NO GROWTH 3 DAYS Performed at [facility], [place] [address] [place], [place] [address]    Report Status PENDING  Incomplete  Respiratory (~20 pathogens) panel by PCR     Status: None   Collection Time: 03/09/22  2:00 PM   Specimen: Nasopharyngeal Swab; Respiratory  Result Value Ref Range Status   Adenovirus NOT DETECTED NOT DETECTED Final   Coronavirus 229E NOT DETECTED NOT DETECTED Final    Comment: (NOTE) The Coronavirus on the Respiratory Panel, DOES NOT test for the novel  Coronavirus (2019 nCoV)    Coronavirus HKU1 NOT DETECTED NOT DETECTED Final   Coronavirus NL63 NOT DETECTED NOT DETECTED Final   Coronavirus OC43 NOT DETECTED NOT DETECTED Final   Metapneumovirus NOT DETECTED NOT DETECTED Final   Rhinovirus / Enterovirus NOT DETECTED NOT DETECTED Final   Influenza A NOT DETECTED NOT DETECTED Final   Influenza B NOT DETECTED NOT DETECTED Final   Parainfluenza Virus 1 NOT DETECTED NOT DETECTED Final   Parainfluenza Virus 2 NOT DETECTED NOT DETECTED Final   Parainfluenza Virus 3 NOT DETECTED NOT  DETECTED Final   Parainfluenza Virus 4 NOT DETECTED NOT DETECTED Final   Respiratory Syncytial Virus NOT DETECTED NOT DETECTED Final   Bordetella pertussis NOT DETECTED NOT DETECTED Final   Bordetella Parapertussis NOT DETECTED NOT DETECTED Final   Chlamydophila pneumoniae NOT DETECTED NOT DETECTED  Final   Mycoplasma pneumoniae NOT DETECTED NOT DETECTED Final    Comment: Performed at [facility], [place] [address] [place], [place] [address]  Resp Panel by RT-PCR (Flu A&B, Covid) Anterior Nasal Swab     Status: None   Collection Time: 03/09/22  2:01 PM   Specimen: Anterior Nasal Swab  Result Value Ref Range Status   SARS Coronavirus 2 by RT PCR NEGATIVE NEGATIVE Final    Comment: (NOTE) SARS-CoV-2 target nucleic acids are NOT DETECTED.  The SARS-CoV-2 RNA is generally detectable in upper respiratory specimens during the acute phase of infection. The lowest concentration of SARS-CoV-2 viral copies this assay can detect is 138 copies/mL. A negative result does not preclude SARS-Cov-2 infection and should not be used as the sole basis for treatment or other patient management decisions. A negative result may occur with  improper specimen collection/handling, submission of specimen other than nasopharyngeal swab, presence of viral mutation(s) within the areas targeted by this assay, and inadequate number of viral copies(<138 copies/mL). A negative result must be combined with clinical observations, patient history, and epidemiological information. The expected result is Negative.  Fact Sheet for Patients:  EntrepreneurPulse.com.au  Fact Sheet for Healthcare Providers:  IncredibleEmployment.be  This test is no t yet approved or cleared by the [place] FDA and  has been authorized for detection and/or diagnosis of SARS-CoV-2 by FDA under an Emergency Use Authorization (EUA). This EUA will remain  in effect (meaning this test can be used) for  the duration of the COVID-19 declaration under Section 564(b)(1) of the Act, 21 U.S.C.section 360bbb-3(b)(1), unless the authorization is terminated  or revoked sooner.       Influenza A by PCR NEGATIVE NEGATIVE Final   Influenza B by PCR NEGATIVE NEGATIVE Final    Comment: (NOTE) The Xpert Xpress SARS-CoV-2/FLU/RSV plus assay is intended as an aid in the diagnosis of influenza from Nasopharyngeal swab specimens and should not be used as a sole basis for treatment. Nasal washings and aspirates are unacceptable for Xpert Xpress SARS-CoV-2/FLU/RSV testing.  Fact Sheet for Patients: EntrepreneurPulse.com.au  Fact Sheet for Healthcare Providers: IncredibleEmployment.be  This test is not yet approved or cleared by the [place] FDA and has been authorized for detection and/or diagnosis of SARS-CoV-2 by FDA under an Emergency Use Authorization (EUA). This EUA will remain in effect (meaning this test can be used) for the duration of the COVID-19 declaration under Section 564(b)(1) of the Act, 21 U.S.C. section 360bbb-3(b)(1), unless the authorization is terminated or revoked.  Performed at [facility], [place] [address], [place], [place] [address]   Blood culture (routine x 2)     Status: None (Preliminary result)   Collection Time: 03/09/22  2:04 PM   Specimen: BLOOD  Result Value Ref Range Status   Specimen Description BLOOD BLOOD RIGHT FOREARM  Final   Special Requests   Final    BOTTLES DRAWN AEROBIC AND ANAEROBIC Blood Culture adequate volume   Culture   Final    NO GROWTH 3 DAYS Performed at [facility], [place] [address], [place], [place] [address]    Report Status PENDING  Incomplete  Urine Culture     Status: None   Collection Time: 03/09/22  5:35 PM   Specimen: Urine, Catheterized  Result Value Ref Range Status   Specimen Description URINE, CATHETERIZED  Final   Special Requests NONE  Final   Culture   Final    NO  GROWTH Performed  at [facility], [place] [address], [place], [place] [address]    Report Status 03/11/2022 FINAL  Final  MRSA Next Gen by PCR, Nasal     Status: None   Collection Time: 03/10/22  6:40 AM   Specimen: Nasal Mucosa; Nasal Swab  Result Value Ref Range Status   MRSA by PCR Next Gen NOT DETECTED NOT DETECTED Final    Comment: (NOTE) The GeneXpert MRSA Assay (FDA approved for NASAL specimens only), is one component of a comprehensive MRSA colonization surveillance program. It is not intended to diagnose MRSA infection nor to guide or monitor treatment for MRSA infections. Test performance is not FDA approved in patients less than 60 years old. Performed at [facility], [place] [address], [place], [place] [address]          Radiology Studies: ECHOCARDIOGRAM COMPLETE  Result Date: 03/11/2022    ECHOCARDIOGRAM REPORT   Patient Name:   Cash Duce Date of Exam: 03/11/2022 Medical Rec #:  [id number]       Height:       74.0 in Accession #:    [id number]      Weight:       174.4 lb Date of Birth:  1951/05/21       BSA:          2.050 m Patient Age:    32 years        BP:           131/81 mmHg Patient Gender: M               HR:           117 bpm. Exam Location:  Inpatient Procedure: 2D Echo, Cardiac Doppler, Color Doppler and Intracardiac            Opacification Agent Indications:    Elevated troponin  History:        Patient has no prior history of Echocardiogram examinations.                 Signs/Symptoms:Shortness of Breath. Atrial fibrillation with                 RVR.  Sonographer:    Merrie Roof RDCS Referring Phys: Irine Seal, V IMPRESSIONS  1. Abnormal septal motion . Left ventricular ejection fraction, by estimation, is 50 to 55%. The left ventricle has low normal function. The left ventricle has no regional wall motion abnormalities. Left ventricular diastolic parameters are indeterminate.  2. Right ventricular systolic function is moderately reduced. The right  ventricular size is moderately enlarged.  3. The mitral valve is abnormal. Trivial mitral valve regurgitation. No evidence of mitral stenosis.  4. The aortic valve is tricuspid. There is mild calcification of the aortic valve. There is mild thickening of the aortic valve. Aortic valve regurgitation is not visualized. Aortic valve sclerosis is present, with no evidence of aortic valve stenosis.  5. The inferior vena cava is dilated in size with >50% respiratory variability, suggesting right atrial pressure of 8 mmHg. FINDINGS  Left Ventricle: Abnormal septal motion. Left ventricular ejection fraction, by estimation, is 50 to 55%. The left ventricle has low normal function. The left ventricle has no regional wall motion abnormalities. Definity contrast agent was given IV to delineate the left ventricular endocardial borders. The left ventricular internal cavity size was normal in size. There is no left ventricular hypertrophy. Left ventricular diastolic parameters are indeterminate. Right Ventricle: The right ventricular size is moderately  enlarged. No increase in right ventricular wall thickness. Right ventricular systolic function is moderately reduced. Left Atrium: Left atrial size was normal in size. Right Atrium: Right atrial size was normal in size. Pericardium: There is no evidence of pericardial effusion. Mitral Valve: The mitral valve is abnormal. There is mild thickening of the mitral valve leaflet(s). There is mild calcification of the mitral valve leaflet(s). Mild mitral annular calcification. Trivial mitral valve regurgitation. No evidence of mitral valve stenosis. Tricuspid Valve: The tricuspid valve is normal in structure. Tricuspid valve regurgitation is mild . No evidence of tricuspid stenosis. Aortic Valve: The aortic valve is tricuspid. There is mild calcification of the aortic valve. There is mild thickening of the aortic valve. Aortic valve regurgitation is not visualized. Aortic valve sclerosis  is present, with no evidence of aortic valve stenosis. Pulmonic Valve: The pulmonic valve was normal in structure. Pulmonic valve regurgitation is not visualized. No evidence of pulmonic stenosis. Aorta: The aortic root is normal in size and structure. Venous: The inferior vena cava is dilated in size with greater than 50% respiratory variability, suggesting right atrial pressure of 8 mmHg. IAS/Shunts: No atrial level shunt detected by color flow Doppler.  LEFT VENTRICLE PLAX 2D LVIDd:         4.10 cm LVIDs:         3.00 cm LV PW:         1.10 cm LV IVS:        1.10 cm LVOT diam:     2.20 cm LV SV:         51 LV SV Index:   25 LVOT Area:     3.80 cm  RIGHT VENTRICLE            IVC RV Basal diam:  5.00 cm    IVC diam: 2.70 cm RV Mid diam:    3.80 cm RV S prime:     7.95 cm/s TAPSE (M-mode): 1.3 cm LEFT ATRIUM             Index        RIGHT ATRIUM           Index LA diam:        3.70 cm 1.80 cm/m   RA Area:     22.80 cm LA Vol (A2C):   79.2 ml 38.63 ml/m  RA Volume:   92.00 ml  44.87 ml/m LA Vol (A4C):   56.0 ml 27.31 ml/m LA Biplane Vol: 68.7 ml 33.50 ml/m  AORTIC VALVE LVOT Vmax:   94.20 cm/s LVOT Vmean:  59.700 cm/s LVOT VTI:    0.135 m  AORTA Ao Root diam: 2.90 cm  SHUNTS Systemic VTI:  0.14 m Systemic Diam: 2.20 cm Jenkins Rouge MD Electronically signed by Jenkins Rouge MD Signature Date/Time: 03/11/2022/9:21:28 AM    Final    DG CHEST PORT 1 VIEW  Result Date: 03/10/2022 CLINICAL DATA:  Central line placement. EXAM: PORTABLE CHEST 1 VIEW COMPARISON:  Chest radiograph dated 03/09/2022. FINDINGS: Right-sided PICC with tip over central SVC. No pneumothorax. Right upper lobe opacities similar or slightly progressed since the prior radiograph. Left lung is clear. No pleural effusion. The cardiac silhouette is within normal limits. No acute osseous pathology. IMPRESSION: 1. Right-sided PICC with tip over central SVC. No pneumothorax. 2. Right upper lobe opacities similar or slightly progressed since the prior  radiograph. Electronically Signed   By: Anner Crete M.D.   On: 03/10/2022 19:38   [place] EKG SITE RITE  Result Date: 03/10/2022 If Site Rite image not attached, placement could not be confirmed due to current cardiac rhythm.  US RENAL  Result Date: 03/10/2022 CLINICAL DATA:  Acute renal failure. EXAM: RENAL / URINARY TRACT ULTRASOUND COMPLETE COMPARISON:  None Available. FINDINGS: Right Kidney: Renal measurements: 11.6 x 4.3 x 6.1 cm = volume: 161.4 mL. Echogenicity within normal limits. No mass or hydronephrosis visualized. Left Kidney: Renal measurements: 11.2 x 6.1 x 6.0 cm = volume: 215.7 mL. Echogenicity within normal limits. No mass or hydronephrosis visualized. Bladder: Appears normal for degree of bladder distention. Other: None. IMPRESSION: Normal renal ultrasound. Electronically Signed   By: Lajean Manes M.D.   On: 03/10/2022 11:57        Scheduled Meds:  apixaban  5 mg Oral BID   budesonide (PULMICORT) nebulizer solution  0.5 mg Nebulization BID   Chlorhexidine Gluconate Cloth  6 each Topical Daily   diltiazem  120 mg Oral Daily   empagliflozin  10 mg Oral Daily   folic acid  1 mg Intravenous Daily   furosemide  20 mg Oral BID   guaiFENesin  1,200 mg Oral BID   insulin aspart  0-15 Units Subcutaneous Q4H   ipratropium  0.5 mg Nebulization TID   levalbuterol  0.63 mg Nebulization TID   loratadine  10 mg Oral Daily   LORazepam  0-4 mg Intravenous Q4H   Followed by   LORazepam  0-4 mg Intravenous Q8H   methylPREDNISolone (SOLU-MEDROL) injection  40 mg Intravenous Daily   multivitamin with minerals  1 tablet Oral Daily   nicotine  7 mg Transdermal Daily   mouth rinse  15 mL Mouth Rinse 4 times per day   pantoprazole  40 mg Oral BID AC   sodium chloride flush  10-40 mL Intracatheter Q12H   sodium chloride flush  3 mL Intravenous Q12H   thiamine  100 mg Oral Daily   Or   thiamine  100 mg Intravenous Daily   Continuous Infusions:  azithromycin Stopped (03/11/22 1736)    cefTRIAXone (ROCEPHIN)  IV Stopped (03/11/22 1602)   diltiazem (CARDIZEM) infusion 10 mg/hr (03/12/22 0900)     LOS: 3 days    Time spent: 45 minutes    Irine Seal, MD [facility]   To contact the attending provider between 7A-7P or the covering provider during after hours 7P-7A, please log into the web site [email or web address] and access using universal [place] password for that web site. If you do not have the password, please call the hospital operator.  03/12/2022, 10:00 AM

## 2022-03-12 NOTE — Progress Notes (Signed)
Rounding Note    Patient Name: Mark [name] Date of Encounter: 03/12/2022  [facility] Cardiologist: None   Subjective   No acute events overnight.  Patient reports to be mildly dyspneic with pleuritic chest discomfort.   Inpatient Medications    Scheduled Meds:  budesonide (PULMICORT) nebulizer solution  0.5 mg Nebulization BID   Chlorhexidine Gluconate Cloth  6 each Topical Daily   folic acid  1 mg Intravenous Daily   guaiFENesin  1,200 mg Oral BID   insulin aspart  0-15 Units Subcutaneous Q4H   ipratropium  0.5 mg Nebulization TID   levalbuterol  0.63 mg Nebulization TID   loratadine  10 mg Oral Daily   LORazepam  0-4 mg Intravenous Q4H   Followed by   LORazepam  0-4 mg Intravenous Q8H   methylPREDNISolone (SOLU-MEDROL) injection  40 mg Intravenous Daily   multivitamin with minerals  1 tablet Oral Daily   nicotine  7 mg Transdermal Daily   mouth rinse  15 mL Mouth Rinse 4 times per day   pantoprazole  40 mg Oral BID AC   sodium chloride flush  10-40 mL Intracatheter Q12H   sodium chloride flush  3 mL Intravenous Q12H   thiamine  100 mg Oral Daily   Or   thiamine  100 mg Intravenous Daily   Continuous Infusions:  azithromycin Stopped (03/11/22 1736)   cefTRIAXone (ROCEPHIN)  IV Stopped (03/11/22 1602)   diltiazem (CARDIZEM) infusion 15 mg/hr (03/12/22 0623)   heparin 1,750 Units/hr (03/12/22 0600)   PRN Meds: acetaminophen **OR** acetaminophen, hydrALAZINE, ipratropium, levalbuterol, LORazepam **OR** LORazepam, LORazepam **OR** LORazepam, meclizine, [DISCONTINUED] ondansetron **OR** ondansetron (ZOFRAN) IV, ondansetron, mouth rinse, oxyCODONE, polyethylene glycol, sodium chloride flush, sorbitol, traZODone   Vital Signs    Vitals:   03/12/22 0600 03/12/22 0615 03/12/22 0630 03/12/22 0645  BP: (!) 130/95 109/80 120/88 103/73  Pulse: 90 85 84 89  Resp: (!) 27 (!) 22 (!) 28 (!) 30  Temp:      TempSrc:      SpO2: 94% 95% 93% 94%  Weight:       Height:        Intake/Output Summary (Last 24 hours) at 03/12/2022 0706 Last data filed at 03/12/2022 [id number] Gross per 24 hour  Intake 2149.06 ml  Output 1640 ml  Net 509.06 ml      03/12/2022    5:00 AM 03/10/2022    7:00 PM 03/10/2022    4:56 AM  Last 3 Weights  Weight (lbs) 176 lb 12.9 oz 174 lb 6.1 oz 172 lb 9.9 oz  Weight (kg) 80.2 kg 79.1 kg 78.3 kg      Telemetry    Atrial fibrillation heart rate currently 100- Personally Reviewed  ECG    A-fib rates - Personally Reviewed  Physical Exam   GEN: No acute distress.   Neck: No JVD Cardiac: Regularly irregular mildly tachycardic, no murmurs, rubs, or gallops.  Respiratory: Mildly increased respiratory effort; mild expiratory wheezes R lung GI: Soft, nontender, non-distended  MS: No edema; No deformity. Neuro:  Nonfocal  Psych: Normal affect   Labs    High Sensitivity Troponin:   Recent Labs  Lab 03/09/22 1400 03/09/22 1653 03/09/22 1927 03/09/22 2250  TROPONINIHS 181* 150* 143* 118*     Chemistry Recent Labs  Lab 03/10/22 0258 03/10/22 1757 03/11/22 0430 03/12/22 0446  NA 138 136 138 138  K 3.5 4.0 3.4* 4.3  CL 102 104 101 103  CO2 25 22 27  29  GLUCOSE 237* 285* 230* 161*  BUN 39* 37* 34* 34*  CREATININE 1.35* 1.15 1.12 0.94  CALCIUM 8.6* 8.5* 8.5* 8.7*  MG 3.2* 3.1*  --  3.2*  PROT 6.8  --  6.8 6.9  ALBUMIN 1.9*  --  1.8* 1.8*  AST 125*  --  100* 75*  ALT 54*  --  66* 67*  ALKPHOS 70  --  81 91  BILITOT 0.6  --  0.4 0.4  GFRNONAA 57* >60 >60 >60  ANIONGAP 11 10 10 6     Lipids No results for input(s): "CHOL", "TRIG", "HDL", "LABVLDL", "LDLCALC", "CHOLHDL" in the last 168 hours.  Hematology Recent Labs  Lab 03/10/22 2127 03/11/22 0430 03/11/22 1035 03/12/22 0446  WBC 22.2* 19.8*  --  21.1*  RBC 3.32* 3.15* 3.13* 3.07*  HGB 11.1* 10.5*  --  10.3*  HCT 34.0* 32.4*  --  31.6*  MCV 102.4* 102.9*  --  102.9*  MCH 33.4 33.3  --  33.6  MCHC 32.6 32.4  --  32.6  RDW 12.7 12.7  --  13.0  PLT  291 303  --  336   Thyroid  Recent Labs  Lab 03/09/22 1402 03/10/22 0258  TSH 0.195*  --   FREET4  --  1.39*    BNP Recent Labs  Lab 03/09/22 1400 03/11/22 0500  BNP 1,309.2* 586.3*    DDimer No results for input(s): "DDIMER" in the last 168 hours.   Radiology    ECHOCARDIOGRAM COMPLETE  Result Date: 03/11/2022    ECHOCARDIOGRAM REPORT   Patient Name:   Mark [name] Date of Exam: 03/11/2022 Medical Rec #:  [id number]       Height:       74.0 in Accession #:    [id number]      Weight:       174.4 lb Date of Birth:  [birth year]       BSA:          2.050 m Patient Age:    71 years        BP:           131/81 mmHg Patient Gender: M               HR:           117 bpm. Exam Location:  Inpatient Procedure: 2D Echo, Cardiac Doppler, Color Doppler and Intracardiac            Opacification Agent Indications:    Elevated troponin  History:        Patient has no prior history of Echocardiogram examinations.                 Signs/Symptoms:Shortness of Breath. Atrial fibrillation with                 RVR.  Sonographer:    Merrie Roof RDCS Referring Phys: Irine Seal, V IMPRESSIONS  1. Abnormal septal motion . Left ventricular ejection fraction, by estimation, is 50 to 55%. The left ventricle has low normal function. The left ventricle has no regional wall motion abnormalities. Left ventricular diastolic parameters are indeterminate.  2. Right ventricular systolic function is moderately reduced. The right ventricular size is moderately enlarged.  3. The mitral valve is abnormal. Trivial mitral valve regurgitation. No evidence of mitral stenosis.  4. The aortic valve is tricuspid. There is mild calcification of the aortic valve. There is mild thickening of the aortic valve. Aortic valve regurgitation is not  visualized. Aortic valve sclerosis is present, with no evidence of aortic valve stenosis.  5. The inferior vena cava is dilated in size with >50% respiratory variability, suggesting right atrial  pressure of 8 mmHg. FINDINGS  Left Ventricle: Abnormal septal motion. Left ventricular ejection fraction, by estimation, is 50 to 55%. The left ventricle has low normal function. The left ventricle has no regional wall motion abnormalities. Definity contrast agent was given IV to delineate the left ventricular endocardial borders. The left ventricular internal cavity size was normal in size. There is no left ventricular hypertrophy. Left ventricular diastolic parameters are indeterminate. Right Ventricle: The right ventricular size is moderately enlarged. No increase in right ventricular wall thickness. Right ventricular systolic function is moderately reduced. Left Atrium: Left atrial size was normal in size. Right Atrium: Right atrial size was normal in size. Pericardium: There is no evidence of pericardial effusion. Mitral Valve: The mitral valve is abnormal. There is mild thickening of the mitral valve leaflet(s). There is mild calcification of the mitral valve leaflet(s). Mild mitral annular calcification. Trivial mitral valve regurgitation. No evidence of mitral valve stenosis. Tricuspid Valve: The tricuspid valve is normal in structure. Tricuspid valve regurgitation is mild . No evidence of tricuspid stenosis. Aortic Valve: The aortic valve is tricuspid. There is mild calcification of the aortic valve. There is mild thickening of the aortic valve. Aortic valve regurgitation is not visualized. Aortic valve sclerosis is present, with no evidence of aortic valve stenosis. Pulmonic Valve: The pulmonic valve was normal in structure. Pulmonic valve regurgitation is not visualized. No evidence of pulmonic stenosis. Aorta: The aortic root is normal in size and structure. Venous: The inferior vena cava is dilated in size with greater than 50% respiratory variability, suggesting right atrial pressure of 8 mmHg. IAS/Shunts: No atrial level shunt detected by color flow Doppler.  LEFT VENTRICLE PLAX 2D LVIDd:         4.10  cm LVIDs:         3.00 cm LV PW:         1.10 cm LV IVS:        1.10 cm LVOT diam:     2.20 cm LV SV:         51 LV SV Index:   25 LVOT Area:     3.80 cm  RIGHT VENTRICLE            IVC RV Basal diam:  5.00 cm    IVC diam: 2.70 cm RV Mid diam:    3.80 cm RV S prime:     7.95 cm/s TAPSE (M-mode): 1.3 cm LEFT ATRIUM             Index        RIGHT ATRIUM           Index LA diam:        3.70 cm 1.80 cm/m   RA Area:     22.80 cm LA Vol (A2C):   79.2 ml 38.63 ml/m  RA Volume:   92.00 ml  44.87 ml/m LA Vol (A4C):   56.0 ml 27.31 ml/m LA Biplane Vol: 68.7 ml 33.50 ml/m  AORTIC VALVE LVOT Vmax:   94.20 cm/s LVOT Vmean:  59.700 cm/s LVOT VTI:    0.135 m  AORTA Ao Root diam: 2.90 cm  SHUNTS Systemic VTI:  0.14 m Systemic Diam: 2.20 cm Charlton Haws MD Electronically signed by Charlton Haws MD Signature Date/Time: 03/11/2022/9:21:28 AM    Final    DG  CHEST PORT 1 VIEW  Result Date: 03/10/2022 CLINICAL DATA:  Central line placement. EXAM: PORTABLE CHEST 1 VIEW COMPARISON:  Chest radiograph dated 03/09/2022. FINDINGS: Right-sided PICC with tip over central SVC. No pneumothorax. Right upper lobe opacities similar or slightly progressed since the prior radiograph. Left lung is clear. No pleural effusion. The cardiac silhouette is within normal limits. No acute osseous pathology. IMPRESSION: 1. Right-sided PICC with tip over central SVC. No pneumothorax. 2. Right upper lobe opacities similar or slightly progressed since the prior radiograph. Electronically Signed   By: Elgie Collard M.D.   On: 03/10/2022 19:38   [place] EKG SITE RITE  Result Date: 03/10/2022 If Site Rite image not attached, placement could not be confirmed due to current cardiac rhythm.  US RENAL  Result Date: 03/10/2022 CLINICAL DATA:  Acute renal failure. EXAM: RENAL / URINARY TRACT ULTRASOUND COMPLETE COMPARISON:  None Available. FINDINGS: Right Kidney: Renal measurements: 11.6 x 4.3 x 6.1 cm = volume: 161.4 mL. Echogenicity within normal limits. No  mass or hydronephrosis visualized. Left Kidney: Renal measurements: 11.2 x 6.1 x 6.0 cm = volume: 215.7 mL. Echogenicity within normal limits. No mass or hydronephrosis visualized. Bladder: Appears normal for degree of bladder distention. Other: None. IMPRESSION: Normal renal ultrasound. Electronically Signed   By: Amie Portland M.D.   On: 03/10/2022 11:57    Cardiac Studies   ECHO 03/11/22    1. Abnormal septal motion . Left ventricular ejection fraction, by  estimation, is 50 to 55%. The left ventricle has low normal function. The  left ventricle has no regional wall motion abnormalities. Left ventricular  diastolic parameters are  indeterminate.   2. Right ventricular systolic function is moderately reduced. The right  ventricular size is moderately enlarged.   3. The mitral valve is abnormal. Trivial mitral valve regurgitation. No  evidence of mitral stenosis.   4. The aortic valve is tricuspid. There is mild calcification of the  aortic valve. There is mild thickening of the aortic valve. Aortic valve  regurgitation is not visualized. Aortic valve sclerosis is present, with  no evidence of aortic valve stenosis.   5. The inferior vena cava is dilated in size with >50% respiratory  variability, suggesting right atrial pressure of 8 mmHg.   Patient Profile     72 y.o. male with A-fib RVR, sepsis, alcohol use, smoker here with acute respiratory distress and acute on chronic diastolic HF requiring BIPAP  Assessment & Plan    New onset atrial fibrillation -Approaching 48hr duration (I reviewed all EKGs; no evidence of prior AF) -Will d/c hep gtt and start Eliquis 5 bid; start diltiazem 180mg  Qday (see discussion below) and wean dilt gtt -Respiratory status improved; watch I/O -Consider cardioversion prior to discharge   2.  Acute diastolic/systolic heart failure -EF ~50-55% with mild RV dysfuction (TAPSE 1.2) -Start lasix 20mg  PO BID; keep net even -Start Jardiance 10mg   Qday -Would be better served with BB rather than CCB given mildly reduced EF/AF; if respiratory status improves, would consider conversion to BB  3.  AKI -Resolved, monitor Cr; start lasix as above  4.  ACS/elevated troponin -Denies exertional chest pain prior to admission; no ischemic evaluation required now.  Likely Type 2 MI due to acute pulmonary disease  5.  RV dysfunction -Mildly diminished with RV dilation; monitor for now (unclear etiology; may be due to pulmonary disease)  6.  Respiratory distress -Abx/nebulizers for PNA; cultures negative  7.  Etoh abuse -Unclear if patient will be  an ideal A/C candidate long term.  He tells me he has fallen in the past (during heavy drinking) and tells me he has quit drinking.        For questions or updates, please contact [place] Please consult [email or web address] for contact info under        Signed, Early Osmond, MD  03/12/2022, 7:06 AM

## 2022-03-12 NOTE — Progress Notes (Signed)
ANTICOAGULATION CONSULT NOTE - Follow up Consult  Pharmacy Consult for heparin  Indication: atrial fibrillation  No Known Allergies  Patient Measurements: Height: 6\' 2"  (188 cm) Weight: 79.1 kg (174 lb 6.1 oz) IBW/kg (Calculated) : 82.2 Heparin Dosing Weight: 77kg  Vital Signs: Temp: 97.6 F (36.4 C) (09/04 0348) Temp Source: Oral (09/04 0348) BP: 114/73 (09/04 0400) Pulse Rate: 73 (09/04 0400)  Labs: Recent Labs    03/09/22 1400 03/09/22 1410 03/09/22 1653 03/09/22 1927 03/09/22 2250 03/10/22 0258 03/10/22 1757 03/10/22 2108 03/10/22 2127 03/11/22 0430 03/11/22 0500 03/11/22 1250 03/11/22 2131 03/12/22 0446  HGB 13.4   < >  --   --   --  11.3*  --   --  11.1* 10.5*  --   --   --  10.3*  HCT 40.4   < >  --   --   --  33.6*  --   --  34.0* 32.4*  --   --   --  31.6*  PLT 368  --   --   --   --  275  --   --  291 303  --   --   --  336  LABPROT 16.0*  --   --   --   --   --   --   --   --   --   --   --   --   --   INR 1.3*  --   --   --   --   --   --   --   --   --   --   --   --   --   HEPARINUNFRC  --   --   --   --   --   --   --    < >  --   --    < > 0.40 0.28* 0.24*  CREATININE 2.86*  --   --  2.04*  --  1.35* 1.15  --   --  1.12  --   --   --   --   CKTOTAL 173  --   --   --   --   --   --   --   --   --   --   --   --   --   TROPONINIHS 181*  --  150* 143* 118*  --   --   --   --   --   --   --   --   --    < > = values in this interval not displayed.     Estimated Creatinine Clearance: 69.6 mL/min (by C-G formula based on SCr of 1.12 mg/dL).   Medical History: Past Medical History:  Diagnosis Date   Allergic rhinitis    Asthma    BPH (benign prostatic hyperplasia)    Carpal tunnel syndrome    Hypertension     Medications:  Scheduled:   budesonide (PULMICORT) nebulizer solution  0.5 mg Nebulization BID   Chlorhexidine Gluconate Cloth  6 each Topical Daily   folic acid  1 mg Intravenous Daily   guaiFENesin  1,200 mg Oral BID   insulin aspart   0-15 Units Subcutaneous Q4H   ipratropium  0.5 mg Nebulization TID   levalbuterol  0.63 mg Nebulization TID   loratadine  10 mg Oral Daily   LORazepam  0-4 mg Intravenous Q4H   Followed by   LORazepam  0-4 mg Intravenous Q8H   methylPREDNISolone (SOLU-MEDROL) injection  40 mg Intravenous Daily   multivitamin with minerals  1 tablet Oral Daily   nicotine  7 mg Transdermal Daily   mouth rinse  15 mL Mouth Rinse 4 times per day   pantoprazole  40 mg Oral BID AC   sodium chloride flush  10-40 mL Intracatheter Q12H   sodium chloride flush  3 mL Intravenous Q12H   thiamine  100 mg Oral Daily   Or   thiamine  100 mg Intravenous Daily   Infusions:   azithromycin Stopped (03/11/22 1736)   cefTRIAXone (ROCEPHIN)  IV Stopped (03/11/22 1602)   diltiazem (CARDIZEM) infusion 15 mg/hr (03/12/22 0400)   heparin 1,600 Units/hr (03/12/22 0400)    Assessment: 71 YO male presenting 9/1 with sepsis due to pneumonia. Currently in Afib--no [facility] PTA. Troponin and BNP both elevated likely due to sepsis and AKI.   Heparin level below goal: 0.24 on 1600 units/hr; no infusion issues or overt s/sx of bleeding per RN. CBC stable   Goal of Therapy:  Heparin level 0.3-0.7 units/ml Monitor platelets by anticoagulation protocol: Yes   Plan:  -Increase heparin to 1750 units/hr -Heparin level in 6 hours and CBC in am  Ruben Im, PharmD Clinical Pharmacist 03/12/2022 5:50 AM Please check AMION for all [facility] Pharmacy numbers

## 2022-03-12 NOTE — Progress Notes (Signed)
NAME:  Mark [name], MRN:  [id number], DOB:  1950/08/21, LOS: 3 ADMISSION DATE:  03/09/2022, CONSULTATION DATE: 9/1 REFERRING MD: Janee Morn, REASON FOR CONSULT: Acute respiratory failure with hypoxia  History of Present Illness:  Mark [name] is an 71 y.o. male who presented to [facility] on 9/1 after EMS was called for dark red/brown stool per rectum, on arrival EMS found the patient in respiratory distress.  He has a pertinent past medical history of asthma, EtOH abuse, hypertension.  ED work-up notable for WBC 34.3, Lactate 7.6 > 4.6, Creatinine 2.86 (baseline 0.9-1),BNP 1.3K, troponin 181> 150.  CXR concerning for RUL PNU with acute asthma exacerbation. A code sepsis was called.  Blood cultures, urine cultures, respirate culture ordered.  Patient was started on IV Rocephin and IV azithromycin.  Per hospitalist notes patient hypoxic and emergency department.  VBG 7.27/46/57/22, patient was started on BiPAP.  Patient was admitted to the hospital service.  PCCM was consulted for acute respiratory failure with hypoxia.  Pertinent  Medical History   Past Medical History:  Diagnosis Date   Allergic rhinitis    Asthma    BPH (benign prostatic hyperplasia)    Carpal tunnel syndrome    Hypertension      Significant Hospital Events: Including procedures, antibiotic start and stop dates in addition to other pertinent events   9/1 presented [facility].  Treated as RUL pneumonia with sepsis. 9/2 remains short of breath on BiPAP.  Obtunded. 9/3 TTE> LVEF 55-60%, RV mildly enlarged 9/4 called back to assess for dyspnea  Interim History / Subjective:   Has been tachypneic and somewhat hypoxemic Afib with RVR, dilt drip Says he feels like he needs to cough up some mucus  Objective   Blood pressure 120/83, pulse 96, temperature 97.7 F (36.5 C), temperature source Oral, resp. rate (!) 27, height 6\' 2"  (1.88 m), weight 80.2 kg, SpO2 94 %. CVP:  [3 mmHg-12 mmHg] 7 mmHg       Intake/Output Summary (Last 24 hours) at 03/12/2022 1129 Last data filed at 03/12/2022 1000 Gross per 24 hour  Intake 2351.82 ml  Output 1790 ml  Net 561.82 ml   Filed Weights   03/10/22 0456 03/10/22 1900 03/12/22 0500  Weight: 78.3 kg 79.1 kg 80.2 kg    Examination:  General:  Sitting up in bed, tachypnea but no accessory muscle use HENT: NCAT OP clear PULM: CTA B, normal effort CV: RRR, no mgr GI: BS+, soft, nontender MSK: normal bulk and tone Neuro: awake, alert, no distress, MAEW   Ancillary tests personally reviewed  9/4 CXR RUL infiltrate, new RLL patchy opacification  Assessment & Plan:    Remains critically ill due to Acute respiratory failure with hypoxemia from RUL pneumonia Atrial fibrillation with RVR Underlying COPD exacerbation Acute diastolic heart failure> appears volume depelete Acute metabolic encephalopathy either due to underying dementia or acute EtOH withdrawal  Discussion: Fluid status appears improved, remains tachypneic and had wheezing by report prior to my assessment.  I think he has slow to resolve symptoms from severe CAP complicated by COPD exacerbation.  Atrial fibrillation not helping.  SLP has seen him, but I am concerned he may have some aspiration given mental status changes and   Plan: Continue solumedrol as ordered Increase frequency of ipratropium and Xopenex Dysphagia diet Dilt gtt per cardiology Tele Administer O2 for O2 saturation > 88% Sit up in bed, eat meals sitting up Incentive spirometry Flutter Guaifenesin BIPAP prn CIWA, folate, thiamine   Best  Practice (right click and "Reselect all SmartList Selections" daily)   Diet/type: NPO w/ oral meds DVT prophylaxis: prophylactic heparin  GI prophylaxis: PPI Lines: N/A Foley:  N/A Code Status:  full code Last date of multidisciplinary goals of care discussion [Full scope 9/1]  CRITICAL CARE Performed by: [facility] [place]   Total critical care time: 32  minutes  Critical care time was exclusive of separately billable procedures and treating other patients.  Critical care was necessary to treat or prevent imminent or life-threatening deterioration.  Critical care was time spent personally by me on the following activities: development of treatment plan with patient and/or surrogate as well as nursing, discussions with consultants, evaluation of patient's response to treatment, examination of patient, obtaining history from patient or surrogate, ordering and performing treatments and interventions, ordering and review of laboratory studies, ordering and review of radiographic studies, pulse oximetry, re-evaluation of patient's condition and participation in multidisciplinary rounds.  Heber [place], MD [place] PCCM Pager: [phone] Cell: [phone] After 7:00 pm call [place]  [phone]

## 2022-03-13 ENCOUNTER — Telehealth (HOSPITAL_COMMUNITY): Payer: Self-pay | Admitting: Pharmacy Technician

## 2022-03-13 ENCOUNTER — Other Ambulatory Visit (HOSPITAL_COMMUNITY): Payer: Self-pay

## 2022-03-13 DIAGNOSIS — J441 Chronic obstructive pulmonary disease with (acute) exacerbation: Secondary | ICD-10-CM | POA: Diagnosis not present

## 2022-03-13 DIAGNOSIS — J45901 Unspecified asthma with (acute) exacerbation: Secondary | ICD-10-CM | POA: Diagnosis not present

## 2022-03-13 DIAGNOSIS — N179 Acute kidney failure, unspecified: Secondary | ICD-10-CM | POA: Diagnosis not present

## 2022-03-13 DIAGNOSIS — I4891 Unspecified atrial fibrillation: Secondary | ICD-10-CM | POA: Diagnosis not present

## 2022-03-13 DIAGNOSIS — R7989 Other specified abnormal findings of blood chemistry: Secondary | ICD-10-CM | POA: Diagnosis not present

## 2022-03-13 DIAGNOSIS — J449 Chronic obstructive pulmonary disease, unspecified: Secondary | ICD-10-CM

## 2022-03-13 DIAGNOSIS — J9601 Acute respiratory failure with hypoxia: Secondary | ICD-10-CM | POA: Diagnosis not present

## 2022-03-13 DIAGNOSIS — I5033 Acute on chronic diastolic (congestive) heart failure: Secondary | ICD-10-CM

## 2022-03-13 LAB — CBC WITH DIFFERENTIAL/PLATELET
Abs Immature Granulocytes: 0.51 10*3/uL — ABNORMAL HIGH (ref 0.00–0.07)
Basophils Absolute: 0.1 10*3/uL (ref 0.0–0.1)
Basophils Relative: 0 %
Eosinophils Absolute: 0 10*3/uL (ref 0.0–0.5)
Eosinophils Relative: 0 %
HCT: 34.2 % — ABNORMAL LOW (ref 39.0–52.0)
Hemoglobin: 11 g/dL — ABNORMAL LOW (ref 13.0–17.0)
Immature Granulocytes: 3 %
Lymphocytes Relative: 4 %
Lymphs Abs: 0.7 10*3/uL (ref 0.7–4.0)
MCH: 33.4 pg (ref 26.0–34.0)
MCHC: 32.2 g/dL (ref 30.0–36.0)
MCV: 104 fL — ABNORMAL HIGH (ref 80.0–100.0)
Monocytes Absolute: 0.8 10*3/uL (ref 0.1–1.0)
Monocytes Relative: 4 %
Neutro Abs: 17.1 10*3/uL — ABNORMAL HIGH (ref 1.7–7.7)
Neutrophils Relative %: 89 %
Platelets: 373 10*3/uL (ref 150–400)
RBC: 3.29 MIL/uL — ABNORMAL LOW (ref 4.22–5.81)
RDW: 12.7 % (ref 11.5–15.5)
WBC: 19.2 10*3/uL — ABNORMAL HIGH (ref 4.0–10.5)
nRBC: 0.5 % — ABNORMAL HIGH (ref 0.0–0.2)

## 2022-03-13 LAB — THYROID ANTIBODIES
Thyroglobulin Antibody: <1 IU/mL (ref 0.0–0.9)
Thyroperoxidase Ab SerPl-aCnc: 15 IU/mL (ref 0–34)

## 2022-03-13 LAB — GLUCOSE, CAPILLARY
Glucose-Capillary: 141 mg/dL — ABNORMAL HIGH (ref 70–99)
Glucose-Capillary: 145 mg/dL — ABNORMAL HIGH (ref 70–99)
Glucose-Capillary: 150 mg/dL — ABNORMAL HIGH (ref 70–99)
Glucose-Capillary: 166 mg/dL — ABNORMAL HIGH (ref 70–99)
Glucose-Capillary: 176 mg/dL — ABNORMAL HIGH (ref 70–99)
Glucose-Capillary: 183 mg/dL — ABNORMAL HIGH (ref 70–99)

## 2022-03-13 LAB — BASIC METABOLIC PANEL
Anion gap: 8 (ref 5–15)
BUN: 30 mg/dL — ABNORMAL HIGH (ref 8–23)
CO2: 30 mmol/L (ref 22–32)
Calcium: 8.9 mg/dL (ref 8.9–10.3)
Chloride: 100 mmol/L (ref 98–111)
Creatinine, Ser: 0.97 mg/dL (ref 0.61–1.24)
GFR, Estimated: >60 mL/min (ref >60)
Glucose, Bld: 179 mg/dL — ABNORMAL HIGH (ref 70–99)
Potassium: 4.3 mmol/L (ref 3.5–5.1)
Sodium: 138 mmol/L (ref 135–145)

## 2022-03-13 LAB — LEGIONELLA PNEUMOPHILA SEROGP 1 UR AG: L. pneumophila Serogp 1 Ur Ag: NEGATIVE

## 2022-03-13 MED ORDER — ALTEPLASE 2 MG IJ SOLR
2.0000 mg | Freq: Once | INTRAMUSCULAR | Status: DC
  Filled 2022-03-13: qty 2

## 2022-03-13 MED ORDER — SODIUM CHLORIDE 0.9% FLUSH: 10.0000 mL | INTRAVENOUS | Status: DC | PRN

## 2022-03-13 MED ORDER — DILTIAZEM HCL ER COATED BEADS 180 MG PO CP24
180.0000 mg | ORAL_CAPSULE | Freq: Every day | ORAL | Status: DC
  Administered 2022-03-13 – 2022-03-14 (×2): 180 mg via ORAL
  Filled 2022-03-13 (×2): qty 1

## 2022-03-13 MED ORDER — SODIUM CHLORIDE 0.9% FLUSH
10.0000 mL | Freq: Two times a day (BID) | INTRAVENOUS | Status: DC
  Administered 2022-03-13: 10 mL
  Administered 2022-03-14: 30 mL
  Administered 2022-03-15 – 2022-03-18 (×6): 10 mL

## 2022-03-13 MED ORDER — ALTEPLASE 2 MG IJ SOLR
2.0000 mg | Freq: Once | INTRAMUSCULAR | Status: AC
  Administered 2022-03-15: 2 mg
  Filled 2022-03-13: qty 2

## 2022-03-13 NOTE — Progress Notes (Signed)
Rounding Note    Patient Name: Mark [name] Date of Encounter: 03/13/2022  [facility] Cardiologist: None   Subjective   Just finished physical therapy exercises.  Still fairly confused.  Heart rate 100-110. Inpatient Medications    Scheduled Meds:  apixaban  5 mg Oral BID   budesonide (PULMICORT) nebulizer solution  0.5 mg Nebulization BID   Chlorhexidine Gluconate Cloth  6 each Topical Daily   diltiazem  120 mg Oral Daily   empagliflozin  10 mg Oral Daily   folic acid  1 mg Intravenous Daily   furosemide  20 mg Oral BID   guaiFENesin  1,200 mg Oral BID   insulin aspart  0-15 Units Subcutaneous Q4H   ipratropium  0.5 mg Nebulization QID   levalbuterol  0.63 mg Nebulization QID   loratadine  10 mg Oral Daily   LORazepam  0-4 mg Intravenous Q8H   methylPREDNISolone (SOLU-MEDROL) injection  60 mg Intravenous Q8H   multivitamin with minerals  1 tablet Oral Daily   nicotine  7 mg Transdermal Daily   mouth rinse  15 mL Mouth Rinse 4 times per day   pantoprazole  40 mg Oral BID AC   sodium chloride flush  10-40 mL Intracatheter Q12H   sodium chloride flush  3 mL Intravenous Q12H   thiamine  100 mg Oral Daily   Or   thiamine  100 mg Intravenous Daily   Continuous Infusions:  azithromycin Stopped (03/12/22 1628)   cefTRIAXone (ROCEPHIN)  IV Stopped (03/12/22 1507)   diltiazem (CARDIZEM) infusion Stopped (03/12/22 1030)   PRN Meds: acetaminophen **OR** acetaminophen, hydrALAZINE, ipratropium, levalbuterol, LORazepam **OR** LORazepam, LORazepam **OR** LORazepam, meclizine, [DISCONTINUED] ondansetron **OR** ondansetron (ZOFRAN) IV, mouth rinse, oxyCODONE, polyethylene glycol, sodium chloride flush, sorbitol, traZODone   Vital Signs    Vitals:   03/13/22 0400 03/13/22 0500 03/13/22 0600 03/13/22 0700  BP: 132/74 (!) 130/92 137/80   Pulse: (!) 104  (!) 103   Resp: (!) 28  (!) 26   Temp:    (!) 97.4 F (36.3 C)  TempSrc:    Axillary  SpO2: 90%  99%   Weight:    78.4 kg   Height:        Intake/Output Summary (Last 24 hours) at 03/13/2022 0821 Last data filed at 03/13/2022 0600 Gross per 24 hour  Intake 1701.17 ml  Output 2650 ml  Net -948.83 ml      03/13/2022    6:00 AM 03/12/2022    5:00 AM 03/10/2022    7:00 PM  Last 3 Weights  Weight (lbs) 172 lb 13.5 oz 176 lb 12.9 oz 174 lb 6.1 oz  Weight (kg) 78.4 kg 80.2 kg 79.1 kg      Telemetry    Atrial fibrillation heart rate currently 100- Personally Reviewed  ECG    A-fib with RVR- Personally Reviewed  Physical Exam   Irreg HR, mild wheeze, no edema  Labs    High Sensitivity Troponin:   Recent Labs  Lab 03/09/22 1400 03/09/22 1653 03/09/22 1927 03/09/22 2250  TROPONINIHS 181* 150* 143* 118*     Chemistry Recent Labs  Lab 03/10/22 0258 03/10/22 1757 03/11/22 0430 03/12/22 0446 03/13/22 0440  NA 138 136 138 138 138  K 3.5 4.0 3.4* 4.3 4.3  CL 102 104 101 103 100  CO2 25 22 27 29 30   GLUCOSE 237* 285* 230* 161* 179*  BUN 39* 37* 34* 34* 30*  CREATININE 1.35* 1.15 1.12 0.94 0.97  CALCIUM  8.6* 8.5* 8.5* 8.7* 8.9  MG 3.2* 3.1*  --  3.2*  --   PROT 6.8  --  6.8 6.9  --   ALBUMIN 1.9*  --  1.8* 1.8*  --   AST 125*  --  100* 75*  --   ALT 54*  --  66* 67*  --   ALKPHOS 70  --  81 91  --   BILITOT 0.6  --  0.4 0.4  --   GFRNONAA 57* >60 >60 >60 >60  ANIONGAP 11 10 10 6 8     Lipids No results for input(s): "CHOL", "TRIG", "HDL", "LABVLDL", "LDLCALC", "CHOLHDL" in the last 168 hours.  Hematology Recent Labs  Lab 03/11/22 0430 03/11/22 1035 03/12/22 0446 03/13/22 0440  WBC 19.8*  --  21.1* 19.2*  RBC 3.15* 3.13* 3.07* 3.29*  HGB 10.5*  --  10.3* 11.0*  HCT 32.4*  --  31.6* 34.2*  MCV 102.9*  --  102.9* 104.0*  MCH 33.3  --  33.6 33.4  MCHC 32.4  --  32.6 32.2  RDW 12.7  --  13.0 12.7  PLT 303  --  336 373   Thyroid  Recent Labs  Lab 03/09/22 1402 03/10/22 0258  TSH 0.195*  --   FREET4  --  1.39*    BNP Recent Labs  Lab 03/09/22 1400 03/11/22 0500  03/12/22 0446  BNP 1,309.2* 586.3* 642.5*    DDimer No results for input(s): "DDIMER" in the last 168 hours.   Radiology    DG CHEST PORT 1 VIEW  Result Date: 03/12/2022 CLINICAL DATA:  Pneumonia EXAM: PORTABLE CHEST 1 VIEW COMPARISON:  Previous studies including the examination of 03/10/2022 FINDINGS: Cardiac size is within normal limits. There is large infiltrate in right upper lobe. There is some decrease in opacity in the infiltrate. However, area of infiltrate has increased. There is new infiltrate in left lower lung field and retrocardiac region suggesting pneumonia in left lower lobe. Rest of the lung fields are clear. There is no significant pleural effusion or pneumothorax. IMPRESSION: Large infiltrate is seen in right upper lobe with increasing area of pneumonia. Infiltrate in the right upper lobe appears less dense. There is new infiltrate in left lower lung fields suggesting new focus of pneumonia in left lower lobe. Electronically Signed   By: Elmer Picker M.D.   On: 03/12/2022 12:51   ECHOCARDIOGRAM COMPLETE  Result Date: 03/11/2022    ECHOCARDIOGRAM REPORT   Patient Name:   Mark [name] Date of Exam: 03/11/2022 Medical Rec #:  [id number]       Height:       74.0 in Accession #:    [id number]      Weight:       174.4 lb Date of Birth:  29-Jul-1950       BSA:          2.050 m Patient Age:    71 years        BP:           131/81 mmHg Patient Gender: M               HR:           117 bpm. Exam Location:  Inpatient Procedure: 2D Echo, Cardiac Doppler, Color Doppler and Intracardiac            Opacification Agent Indications:    Elevated troponin  History:        Patient has no prior history of  Echocardiogram examinations.                 Signs/Symptoms:Shortness of Breath. Atrial fibrillation with                 RVR.  Sonographer:    Merrie Roof RDCS Referring Phys: Irine Seal, V IMPRESSIONS  1. Abnormal septal motion . Left ventricular ejection fraction, by estimation, is 50 to  55%. The left ventricle has low normal function. The left ventricle has no regional wall motion abnormalities. Left ventricular diastolic parameters are indeterminate.  2. Right ventricular systolic function is moderately reduced. The right ventricular size is moderately enlarged.  3. The mitral valve is abnormal. Trivial mitral valve regurgitation. No evidence of mitral stenosis.  4. The aortic valve is tricuspid. There is mild calcification of the aortic valve. There is mild thickening of the aortic valve. Aortic valve regurgitation is not visualized. Aortic valve sclerosis is present, with no evidence of aortic valve stenosis.  5. The inferior vena cava is dilated in size with >50% respiratory variability, suggesting right atrial pressure of 8 mmHg. FINDINGS  Left Ventricle: Abnormal septal motion. Left ventricular ejection fraction, by estimation, is 50 to 55%. The left ventricle has low normal function. The left ventricle has no regional wall motion abnormalities. Definity contrast agent was given IV to delineate the left ventricular endocardial borders. The left ventricular internal cavity size was normal in size. There is no left ventricular hypertrophy. Left ventricular diastolic parameters are indeterminate. Right Ventricle: The right ventricular size is moderately enlarged. No increase in right ventricular wall thickness. Right ventricular systolic function is moderately reduced. Left Atrium: Left atrial size was normal in size. Right Atrium: Right atrial size was normal in size. Pericardium: There is no evidence of pericardial effusion. Mitral Valve: The mitral valve is abnormal. There is mild thickening of the mitral valve leaflet(s). There is mild calcification of the mitral valve leaflet(s). Mild mitral annular calcification. Trivial mitral valve regurgitation. No evidence of mitral valve stenosis. Tricuspid Valve: The tricuspid valve is normal in structure. Tricuspid valve regurgitation is mild . No  evidence of tricuspid stenosis. Aortic Valve: The aortic valve is tricuspid. There is mild calcification of the aortic valve. There is mild thickening of the aortic valve. Aortic valve regurgitation is not visualized. Aortic valve sclerosis is present, with no evidence of aortic valve stenosis. Pulmonic Valve: The pulmonic valve was normal in structure. Pulmonic valve regurgitation is not visualized. No evidence of pulmonic stenosis. Aorta: The aortic root is normal in size and structure. Venous: The inferior vena cava is dilated in size with greater than 50% respiratory variability, suggesting right atrial pressure of 8 mmHg. IAS/Shunts: No atrial level shunt detected by color flow Doppler.  LEFT VENTRICLE PLAX 2D LVIDd:         4.10 cm LVIDs:         3.00 cm LV PW:         1.10 cm LV IVS:        1.10 cm LVOT diam:     2.20 cm LV SV:         51 LV SV Index:   25 LVOT Area:     3.80 cm  RIGHT VENTRICLE            IVC RV Basal diam:  5.00 cm    IVC diam: 2.70 cm RV Mid diam:    3.80 cm RV S prime:     7.95 cm/s TAPSE (M-mode): 1.3 cm  LEFT ATRIUM             Index        RIGHT ATRIUM           Index LA diam:        3.70 cm 1.80 cm/m   RA Area:     22.80 cm LA Vol (A2C):   79.2 ml 38.63 ml/m  RA Volume:   92.00 ml  44.87 ml/m LA Vol (A4C):   56.0 ml 27.31 ml/m LA Biplane Vol: 68.7 ml 33.50 ml/m  AORTIC VALVE LVOT Vmax:   94.20 cm/s LVOT Vmean:  59.700 cm/s LVOT VTI:    0.135 m  AORTA Ao Root diam: 2.90 cm  SHUNTS Systemic VTI:  0.14 m Systemic Diam: 2.20 cm Charlton Haws MD Electronically signed by Charlton Haws MD Signature Date/Time: 03/11/2022/9:21:28 AM    Final     Cardiac Studies   ECHO 03/11/22    1. Abnormal septal motion . Left ventricular ejection fraction, by  estimation, is 50 to 55%. The left ventricle has low normal function. The  left ventricle has no regional wall motion abnormalities. Left ventricular  diastolic parameters are  indeterminate.   2. Right ventricular systolic function is  moderately reduced. The right  ventricular size is moderately enlarged.   3. The mitral valve is abnormal. Trivial mitral valve regurgitation. No  evidence of mitral stenosis.   4. The aortic valve is tricuspid. There is mild calcification of the  aortic valve. There is mild thickening of the aortic valve. Aortic valve  regurgitation is not visualized. Aortic valve sclerosis is present, with  no evidence of aortic valve stenosis.   5. The inferior vena cava is dilated in size with >50% respiratory  variability, suggesting right atrial pressure of 8 mmHg.   Patient Profile     71 y.o. male with A-fib RVR, sepsis, alcohol use, smoker  Assessment & Plan    A-fib with RVR Acute diastolic heart failure - diltiazem 120 CD started 9/4, I will increase to 180 -Prior heart rates were as high as 182 on 03/10/2022 driven by pneumonia, sepsis.  EF 55%, elevated free T4.  Trying to avoid amiodarone.   -- Now on Eliquis 5 mg twice a day for anticoagulation. -Lasix low-dose 20 mg twice daily to keep even.  On Jardiance 10 mg as well.  Elevated troponin secondary to type II MI with underlying pulmonary disease - Likely secondary to sepsis, COPD, atrial fibrillation with rapid ventricular response -Continue with supportive care.  Does not require cardiac rehab etc.  Alcohol use Delirium - Has fallen in the past.  May not be a good long-term anticoagulation candidate.    For questions or updates, please contact [facility] Please consult [email or web address] for contact info under        Signed, Donato Schultz, MD  03/13/2022, 8:21 AM

## 2022-03-13 NOTE — TOC Initial Note (Signed)
Transition of Care [facility]) - Initial/Assessment Note    Patient Details  Name: Mark [name] MRN: [id number] Date of Birth: 11-19-1950  [facility] CM/SW Contact:    Mark [name], LCSWA Phone Number: 03/13/2022, 1:45 PM  Clinical Narrative:                  CSW received consult for possible SNF placement at time of discharge. Due to patients current orientation CSW LVM for patients son Mark [name]. CSW unable to leave voicemail. CSW will try back to discuss dc plan for patient. CSW to continue to follow and assist with discharge planning needs.   Expected Discharge Plan: Skilled Nursing Facility Barriers to Discharge: Continued Medical Work up   Patient Goals and CMS Choice Patient states their goals for this hospitalization and ongoing recovery are:: wants to get better CMS Medicare.gov Compare Post Acute Care list provided to:: Patient Choice offered to / list presented to : Patient  Expected Discharge Plan and Services Expected Discharge Plan: Skilled Nursing Facility In-house Referral: Clinical Social Work Discharge Planning Services: CM Consult Post Acute Care Choice: Home Health, Skilled Nursing Facility Living arrangements for the past 2 months: Single Family Home                                      Prior Living Arrangements/Services Living arrangements for the past 2 months: Single Family Home Lives with:: Self Patient language and need for interpreter reviewed:: Yes Do you feel safe going back to the place where you live?: Yes          Current home services: DME ([employer], walker) Criminal Activity/Legal Involvement Pertinent to Current Situation/Hospitalization: No - Comment as needed  Activities of Daily Living Home Assistive Devices/Equipment: None ADL Screening (condition at time of admission) Patient's cognitive ability adequate to safely complete daily activities?: Yes Is the patient deaf or have difficulty hearing?: No Does the patient  have difficulty seeing, even when wearing glasses/contacts?: No Does the patient have difficulty concentrating, remembering, or making decisions?: No Patient able to express need for assistance with ADLs?: Yes Does the patient have difficulty dressing or bathing?: Yes Independently performs ADLs?: No Communication: Independent Dressing (OT): Needs assistance Is this a change from baseline?: Change from baseline, expected to last <3days Grooming: Needs assistance Is this a change from baseline?: Change from baseline, expected to last <3 days Feeding: Independent Bathing: Needs assistance Is this a change from baseline?: Change from baseline, expected to last <3 days Toileting: Needs assistance Is this a change from baseline?: Change from baseline, expected to last <3 days In/Out Bed: Needs assistance Is this a change from baseline?: Change from baseline, expected to last <3 days Walks in Home: Needs assistance Is this a change from baseline?: Change from baseline, expected to last <3 days Does the patient have difficulty walking or climbing stairs?: Yes Weakness of Legs: Both Weakness of Arms/Hands: None  Permission Sought/Granted Permission sought to share information with : Case Manager, Family Supports, PCP Permission granted to share information with : Yes, Verbal Permission Granted  Share Information with NAME: Mark [name]     Permission granted to share info w Relationship: son     Emotional Assessment Appearance:: Appears stated age Attitude/Demeanor/Rapport: Engaged Affect (typically observed): Accepting Orientation: : Oriented to Self, Oriented to Place, Oriented to  Time   Psych Involvement: No (comment)  Admission diagnosis:  Elevated troponin [R77.8]  Chronic alcohol abuse [F10.10] Acute respiratory failure with hypoxia (HCC) [J96.01] Severe persistent asthma with acute exacerbation [J45.51] AKI (acute kidney injury) (HCC) [N17.9] Severe sepsis (HCC) [A41.9,  R65.20] Pneumonia of right lung due to infectious organism, unspecified part of lung [J18.9] Patient Active Problem List   Diagnosis Date Noted   COPD with acute exacerbation (HCC)    Hypervolemia    Atrial fibrillation with RVR (HCC) 03/10/2022   Acute respiratory failure with hypoxia (HCC) 03/09/2022   Sepsis due to pneumonia (HCC) 03/09/2022   Elevated troponin 03/09/2022   EtOH dependence (HCC) 03/09/2022   Tobacco abuse 03/09/2022   PNA (pneumonia) 03/09/2022   ARF (acute renal failure) (HCC) 03/09/2022   Dehydration 03/09/2022   Abnormal TSH 03/09/2022   Chronic alcohol abuse    Severe persistent asthma with acute exacerbation    AKI (acute kidney injury) (HCC)    Severe sepsis (HCC)    Asthma exacerbation 11/10/2019   Tremor 11/10/2019   Alcohol abuse with intoxication (HCC) 11/10/2019   Hypertensive urgency 11/10/2019   Transaminitis 11/10/2019   PCP:  Hillery Aldo, NP Pharmacy:   [facility] [phone] Ginette Otto, [place] - [address] AT [facility] [address] [place] [address] Phone: [phone] Fax: [phone]  [facility] at [facility] [address] [place] [address] Phone: [phone] Fax: [phone]     Social Determinants of Health (SDOH) Interventions    Readmission Risk Interventions     No data to display

## 2022-03-13 NOTE — TOC Benefit Eligibility Note (Signed)
Patient Product/process development scientist completed.    The patient is currently admitted and upon discharge could be taking Eliquis 5 mg.  The current 30 day co-pay is $0.00.   The patient is currently admitted and upon discharge could be taking Jardiance 10 mg.  The current 30 day co-pay is $0.00.   The patient is insured through [employer] Medicare Part D     Roland Earl, CPhT Pharmacy Patient Advocate Specialist [facility] Patient Advocate Team Direct Number: [phone]  Fax: [phone]

## 2022-03-13 NOTE — Progress Notes (Signed)
Physical Therapy Treatment Patient Details Name: Mark [name] MRN: [id number] DOB: 1951/05/24 Today's Date: 03/13/2022   History of Present Illness 71 y/o male admitted 03/09/22 for respiratory distress, chest pain, and diarrhea. Pt with sepsis due to PNA. 9/2 respiratory distress requiring bipap with transfer to ICU and Afib with RVR. PMHx: asthma, ETOH dependence, HTN, COPD    PT Comments    Pt remains with decreased orientation, processing and function who was able to progress to short shuffling steps today but significantly limited compared to baseline. Pt requires increased time for processing all commands and questions and providing altered answers from prior date stating he has been sleeping on couch for a month but was able to get to the bathroom and kitchen and that he normally does 10 push ups daily. Will continue to follow to progress function and cognition.   HR 111-126 SPo2 93-97% on 4L at rest and with seated HEP, drop to 84% on 4L with standing and required seated recovery on 6L for 2 min to recover. BP pre activity 125/108 Post activity 150/77    Recommendations for follow up therapy are one component of a multi-disciplinary discharge planning process, led by the attending physician.  Recommendations may be updated based on patient status, additional functional criteria and insurance authorization.  Follow Up Recommendations  Skilled nursing-short term rehab (<3 hours/day) Can patient physically be transported by private vehicle: No   Assistance Recommended at Discharge Frequent or constant Supervision/Assistance  Patient can return home with the following A lot of help with walking and/or transfers;Assistance with cooking/housework;Assist for transportation;A lot of help with bathing/dressing/bathroom;Direct supervision/assist for medications management;Direct supervision/assist for financial management   Equipment Recommendations  Rolling walker (2 wheels);BSC/3in1     Recommendations for Other Services       Precautions / Restrictions Precautions Precautions: Fall Precaution Comments: watch HR and O2 Restrictions Weight Bearing Restrictions: No     Mobility  Bed Mobility Overal bed mobility: Needs Assistance Bed Mobility: Supine to Sit     Supine to sit: Min assist, HOB elevated     General bed mobility comments: HOB 30 degrees, increased time with min assist to advance RLE and initiate movement, mod cues    Transfers Overall transfer level: Needs assistance   Transfers: Sit to/from Stand Sit to Stand: Min assist           General transfer comment: min assist to rise from surface with cues for hand placement and assist to control descent. Pt with great difficulty following cues for hand placement with delayed initiation.    Ambulation/Gait Ambulation/Gait assistance: Min assist, +2 safety/equipment Gait Distance (Feet): 10 Feet Assistive device: Rolling walker (2 wheels) Gait Pattern/deviations: Trunk flexed, Shuffle   Gait velocity interpretation: <1.31 ft/sec, indicative of household ambulator   General Gait Details: pt with short shuffling steps with min assist to direct and advance RW with chair follow   Stairs             Wheelchair Mobility    Modified Rankin (Stroke Patients Only)       Balance Overall balance assessment: Needs assistance Sitting-balance support: Bilateral upper extremity supported, Feet supported Sitting balance-Leahy Scale: Fair Sitting balance - Comments: EOB with bil UE support   Standing balance support: Bilateral upper extremity supported, Reliant on assistive device for balance Standing balance-Leahy Scale: Poor Standing balance comment: bil UE on RW in standing  Cognition Arousal/Alertness: Awake/alert Behavior During Therapy: Flat affect Overall Cognitive Status: Impaired/Different from baseline Area of Impairment: Orientation,  Attention, Memory, Following commands, Safety/judgement, Problem solving                 Orientation Level: Disoriented to, Time, Place, Situation Current Attention Level: Focused Memory: Decreased short-term memory Following Commands: Follows one step commands inconsistently, Follows one step commands with increased time Safety/Judgement: Decreased awareness of deficits, Decreased awareness of safety   Problem Solving: Slow processing, Decreased initiation, Difficulty sequencing, Requires verbal cues, Requires tactile cues General Comments: pt oriented to self only. Pt unable to state month or year and only repeats day after education. Very slow processing with up to 30 sec processing time and continues to requiring repetition and multimodal cues. Unsure if part of delay is lack of engagement from pt        Exercises General Exercises - Lower Extremity Long Arc Quad: Both, 10 reps, Seated, AAROM Hip Flexion/Marching: AAROM, Both, 10 reps, Seated    General Comments        Pertinent Vitals/Pain Pain Assessment Pain Score: 4  Pain Location: left leg Pain Descriptors / Indicators: Aching, Guarding, Sore Pain Intervention(s): Limited activity within patient's tolerance, Monitored during session, Repositioned    Home Living                          Prior Function            PT Goals (current goals can now be found in the care plan section) Progress towards PT goals: Progressing toward goals    Frequency    Min 3X/week      PT Plan Current plan remains appropriate    Co-evaluation              AM-PAC PT "6 Clicks" Mobility   Outcome Measure  Help needed turning from your back to your side while in a flat bed without using bedrails?: A Little Help needed moving from lying on your back to sitting on the side of a flat bed without using bedrails?: A Little Help needed moving to and from a bed to a chair (including a wheelchair)?: A Lot Help  needed standing up from a chair using your arms (e.g., wheelchair or bedside chair)?: A Little Help needed to walk in hospital room?: A Lot Help needed climbing 3-5 steps with a railing? : Total 6 Click Score: 14    End of Session Equipment Utilized During Treatment: Oxygen Activity Tolerance: Patient limited by fatigue Patient left: in chair;with call bell/phone within reach;with chair alarm set;with nursing/sitter in room Nurse Communication: Mobility status PT Visit Diagnosis: Unsteadiness on feet (R26.81);Muscle weakness (generalized) (M62.81);Difficulty in walking, not elsewhere classified (R26.2);Other abnormalities of gait and mobility (R26.89)     Time: 9323-5573 PT Time Calculation (min) (ACUTE ONLY): 26 min  Charges:  $Therapeutic Activity: 23-37 mins                     Merryl Hacker, PT Acute Rehabilitation Services Office: [phone]    Mark [name] 03/13/2022, 9:57 AM

## 2022-03-13 NOTE — TOC Initial Note (Signed)
Transition of Care [facility]) - Initial/Assessment Note    Patient Details  Name: Mark [name] MRN: [id number] Date of Birth: 11-06-1950  Transition of Care [facility]) CM/SW Contact:    Mark Cousin, RN Phone Number: [phone] 03/13/2022, 9:14 AM  Clinical Narrative:                 TOC CM spoke to pt at bedside. Prefers CM call son, attempted call to number for son. Unable to reach pt with listed number. PT is recommending SNF rehab.   Expected Discharge Plan: Skilled Nursing Facility Barriers to Discharge: Continued Medical Work up   Patient Goals and CMS Choice Patient states their goals for this hospitalization and ongoing recovery are:: wants to get better CMS Medicare.gov Compare Post Acute Care list provided to:: Patient Choice offered to / list presented to : Patient  Expected Discharge Plan and Services Expected Discharge Plan: Skilled Nursing Facility In-house Referral: Clinical Social Work Discharge Planning Services: CM Consult Post Acute Care Choice: Home Health, Skilled Nursing Facility Living arrangements for the past 2 months: Single Family Home                                      Prior Living Arrangements/Services Living arrangements for the past 2 months: Single Family Home Lives with:: Self Patient language and need for interpreter reviewed:: Yes Do you feel safe going back to the place where you live?: Yes          Current home services: DME ([employer], walker) Criminal Activity/Legal Involvement Pertinent to Current Situation/Hospitalization: No - Comment as needed  Activities of Daily Living Home Assistive Devices/Equipment: None ADL Screening (condition at time of admission) Patient's cognitive ability adequate to safely complete daily activities?: Yes Is the patient deaf or have difficulty hearing?: No Does the patient have difficulty seeing, even when wearing glasses/contacts?: No Does the patient have difficulty concentrating,  remembering, or making decisions?: No Patient able to express need for assistance with ADLs?: Yes Does the patient have difficulty dressing or bathing?: Yes Independently performs ADLs?: No Communication: Independent Dressing (OT): Needs assistance Is this a change from baseline?: Change from baseline, expected to last <3days Grooming: Needs assistance Is this a change from baseline?: Change from baseline, expected to last <3 days Feeding: Independent Bathing: Needs assistance Is this a change from baseline?: Change from baseline, expected to last <3 days Toileting: Needs assistance Is this a change from baseline?: Change from baseline, expected to last <3 days In/Out Bed: Needs assistance Is this a change from baseline?: Change from baseline, expected to last <3 days Walks in Home: Needs assistance Is this a change from baseline?: Change from baseline, expected to last <3 days Does the patient have difficulty walking or climbing stairs?: Yes Weakness of Legs: Both Weakness of Arms/Hands: None  Permission Sought/Granted Permission sought to share information with : Case Manager, Family Supports, PCP Permission granted to share information with : Yes, Verbal Permission Granted  Share Information with NAME: Mark [name]     Permission granted to share info w Relationship: son     Emotional Assessment Appearance:: Appears stated age Attitude/Demeanor/Rapport: Engaged Affect (typically observed): Accepting Orientation: : Oriented to Self, Oriented to Place, Oriented to  Time   Psych Involvement: No (comment)  Admission diagnosis:  Elevated troponin [R77.8] Chronic alcohol abuse [F10.10] Acute respiratory failure with hypoxia (HCC) [J96.01] Severe persistent asthma with acute  exacerbation [J45.51] AKI (acute kidney injury) (HCC) [N17.9] Severe sepsis (HCC) [A41.9, R65.20] Pneumonia of right lung due to infectious organism, unspecified part of lung [J18.9] Patient Active  Problem List   Diagnosis Date Noted   Hypervolemia    Atrial fibrillation with rapid ventricular response (HCC) 03/10/2022   Acute respiratory failure with hypoxia (HCC) 03/09/2022   Sepsis due to pneumonia (HCC) 03/09/2022   Elevated troponin 03/09/2022   EtOH dependence (HCC) 03/09/2022   Tobacco abuse 03/09/2022   PNA (pneumonia) 03/09/2022   ARF (acute renal failure) (HCC) 03/09/2022   Dehydration 03/09/2022   Abnormal TSH 03/09/2022   Chronic alcohol abuse    Severe persistent asthma with acute exacerbation    AKI (acute kidney injury) (HCC)    Severe sepsis (HCC)    Asthma exacerbation 11/10/2019   Tremor 11/10/2019   Alcohol abuse with intoxication (HCC) 11/10/2019   Hypertensive urgency 11/10/2019   Transaminitis 11/10/2019   PCP:  Hillery Aldo, NP Pharmacy:   [facility] [phone] Ginette Otto, [place] - [address] AT [facility] [address] [place] [address] Phone: [phone] Fax: [phone]  [facility] at [facility] [address] [place] [address] Phone: [phone] Fax: [phone]     Social Determinants of Health (SDOH) Interventions    Readmission Risk Interventions     No data to display

## 2022-03-13 NOTE — Progress Notes (Addendum)
NAME:  Mark [name], MRN:  [id number], DOB:  26-Apr-1951, LOS: 4 ADMISSION DATE:  03/09/2022, CONSULTATION DATE: 9/1 REFERRING MD: Janee Morn, REASON FOR CONSULT: Acute respiratory failure with hypoxia  History of Present Illness:  Mark [name] is an 71 y.o. male who presented to [facility] on 9/1 after EMS was called for dark red/brown stool per rectum, on arrival EMS found the patient in respiratory distress.  He has a pertinent past medical history of asthma, EtOH abuse, hypertension.  ED work-up notable for WBC 34.3, Lactate 7.6 > 4.6, Creatinine 2.86 (baseline 0.9-1),BNP 1.3K, troponin 181> 150.  CXR concerning for RUL PNU with acute asthma exacerbation. A code sepsis was called.  Blood cultures, urine cultures, respirate culture ordered.  Patient was started on IV Rocephin and IV azithromycin.  Per hospitalist notes patient hypoxic and emergency department.  VBG 7.27/46/57/22, patient was started on BiPAP.  Patient was admitted to the hospital service.  PCCM was consulted for acute respiratory failure with hypoxia.  Pertinent  Medical History   Past Medical History:  Diagnosis Date   Allergic rhinitis    Asthma    BPH (benign prostatic hyperplasia)    Carpal tunnel syndrome    Hypertension      Significant Hospital Events: Including procedures, antibiotic start and stop dates in addition to other pertinent events   9/1 presented [facility].  Treated as RUL pneumonia with sepsis. 9/2 remains short of breath on BiPAP.  Obtunded. 9/3 TTE> LVEF 55-60%, RV mildly enlarged 9/4 called back to assess for dyspnea  Interim History / Subjective:  Complaining of still feeling short of breath-worse with exertion. No new complaints.  Objective   Blood pressure 137/80, pulse (!) 103, temperature (!) 97.4 F (36.3 C), temperature source Axillary, resp. rate (!) 26, height 6\' 2"  (1.88 m), weight 78.4 kg, SpO2 99 %. CVP:  [0 mmHg-7 mmHg] 0 mmHg  FiO2 (%):  [40 %] 40 %    Intake/Output Summary (Last 24 hours) at 03/13/2022 0819 Last data filed at 03/13/2022 0600 Gross per 24 hour  Intake 1701.17 ml  Output 2650 ml  Net -948.83 ml    Filed Weights   03/10/22 1900 03/12/22 0500 03/13/22 0600  Weight: 79.1 kg 80.2 kg 78.4 kg    Examination:  General: Sitting up in the recliner in no acute distress, working with physical therapy HENT: [place]/AT, eyes anicteric PULM: Mild wheezing in the right base, rales in the left base.  No accessory muscle use.  Mild tachypnea. CV: S1-S2, tachycardic, regular rhythm GI: Soft, nontender MSK: Normal muscle tone, no cyanosis.  Mild pretibial and pedal edema. Neuro: Awake and alert, short attention span.  Moving all extremities  CXR personally reviewed> enlarged RUL infiltrate since admission, now medial RLL as well  BUN 30 Cr 0.97 BNP 642 WBC 19.2 H/H 11/34.2  Ancillary tests personally reviewed    Assessment & Plan:    Remains critically ill due to Atrial fibrillation with RVR Acute respiratory failure with hypoxemia from RUL, RLL pneumonia Acute COPD exacerbation due to pneumonia Acute diastolic heart failure Acute metabolic encephalopathy either due to underying dementia or acute EtOH withdrawal Tobacco abuse  Discussion: -Collect sputum culture if able -Continue steroids - Continue azithromycin and ceftriaxone to complete 7 days. -Continue BiPAP nightly and as needed - Pulmonary hygiene - Continue ipratropium and Xopenex nebs plus Pulmicort nebs. -With his history of alcohol abuse he is high risk for a more severe pneumonia, which will likely be slower to resolve.  Con't supportive care. -We will counsel importance of tobacco cessation when appropriate Continue nicotine replacement therapy -Continue on bed mobility to help with delirium - CIWA - Vitamins   I think he has slow to resolve symptoms from severe CAP complicated by COPD exacerbation.  Atrial fibrillation not helping.  SLP has seen him,  but I am concerned he may have some aspiration given mental status changes and      Best Practice (right click and "Reselect all SmartList Selections" daily)   Diet/type: NPO w/ oral meds DVT prophylaxis: prophylactic heparin  GI prophylaxis: PPI Lines: N/A Foley:  N/A Code Status:  full code Last date of multidisciplinary goals of care discussion [Full scope 9/1]  This patient is critically ill with multiple organ system failure which requires frequent high complexity decision making, assessment, support, evaluation, and titration of therapies. This was completed through the application of advanced monitoring technologies and extensive interpretation of multiple databases. During this encounter critical care time was devoted to patient care services described in this note for 31 minutes.   Steffanie Dunn, DO 03/13/22 11:03 AM [facility]

## 2022-03-13 NOTE — Progress Notes (Signed)
CPT held at this time due to pt being up in the chair. Flutter valve done instead. Pt tolerated well.

## 2022-03-13 NOTE — Progress Notes (Addendum)
PROGRESS NOTE    Mark [name]  TKP:[id number] DOB: 12-19-1950 DOA: 03/09/2022 PCP: Cipriano Mile, NP    No chief complaint on file.   Brief Narrative:  Patient 71 year old gentleman history of asthma, alcohol dependence, hypertension, BPH, allergic rhinitis presented to the ED with 4-day history of productive cough brownish sputum, acute respiratory distress requiring BiPAP.  Patient noted on work-up to be septic with concern for right upper lobe pneumonia, patient in acute respiratory distress concern for acute asthma exacerbation.  Patient noted to go into atrial fibrillation with RVR this morning 03/10/2022.  Patient placed on Cardizem drip and subsequently transition to oral Cardizem.  PCCM cardiology following.   Assessment & Plan:   Principal Problem:   Acute respiratory failure with hypoxia (HCC) Active Problems:   Sepsis due to pneumonia ([place])   Asthma exacerbation   Transaminitis   Elevated troponin   EtOH dependence (HCC)   Tobacco abuse   PNA (pneumonia)   ARF (acute renal failure) (HCC)   Dehydration   Abnormal TSH   Chronic alcohol abuse   Severe persistent asthma with acute exacerbation   AKI (acute kidney injury) ([place])   Severe sepsis (HCC)   Atrial fibrillation with rapid ventricular response (HCC)   Hypervolemia  #1 acute respiratory failure with hypoxia secondary to right upper lobe pneumonia and acute asthma exacerbation/COPD exacerbation, concern for volume overload/CHF exacerbation. -Likely multifactorial secondary to right upper lobe pneumonia in the setting of an acute asthma exacerbation/COPD exacerbation and concern for volume overload/CHF exacerbation. -On admission patient was brought in on CPAP, had received IM epinephrine, 2 g of mag sulfate, 125 mg of Solu-Medrol, 3 nebulizer treatments in the field. -Patient transition to BiPAP. -Chest x-ray done concerning for right upper lobe pneumonia. -Patient noted on admission to be tachypneic,  tachycardic, noted to have a significant leukocytosis, concerning for sepsis. -Blood cultures obtained and pending. -Urinalysis nitrite negative leukocytes negative. -Urine strep pneumococcus antigen negative. -MRSA PCR negative. -Respiratory viral panel negative. -Urine Legionella antigen pending.   -Sputum Gram stain and culture pending. -Lactic acid level elevated and trended down. -Continue empiric IV Rocephin, IV azithromycin, Claritin, PPI, Pulmicort, Atrovent and Xopenex nebs, IV Solu-Medrol. -Patient noted to have gone into worsening respiratory distress the afternoon of 03/10/2022, transferred to the ICU, seen by critical care and concerns for also possible volume overload. -Patient placed back on the BiPAP which was subsequently removed the evening of 03/10/2022, due to nausea currently on 3-4 L nasal cannula with sats in the mid 90s. -Patient given Lasix 100 mg IV x1.  PCCM on 03/10/2022 with a urine output of 2.8 L. -Patient received Lasix 40 mg IV x1 on 03/11/2022 with urine output of 1.6 L. -Patient transition to oral Lasix per cardiology on 03/12/2022 with a urine output of 2.650 L over the past 24 hours. -Patient noted to be tight on exam, poor air movement, some diffuse wheezing on 03/12/2022, and as such increased Solu-Medrol back to 60 mg IV every 8 hours.   -Patient reassessed by PCCM and nebulizer treatments changed to every 4 hours.  -Patient with some clinical improvement over the past 24 hours. -We will add a chest vest. -Continue current treatments. -PCCM following and appreciate input and recommendations.   2.  Sepsis secondary to pneumonia, POA -Patient on admission met criteria for sepsis with tachycardia, tachypnea, significant leukocytosis, lactic acidosis, acute respiratory distress, chest x-ray consistent with a right upper lobe pneumonia. -Blood cultures ordered and pending with no growth to date. -MRSA  PCR negative. -Respiratory viral panel negative. -Urinalysis  nitrite negative leukocytes negative. -Lactic acid elevated at 7.6 on admission>>> 4.6>>> 2.1 >>> 1.8 -Urine pneumococcus antigen negative. -Urine Legionella antigen pending. -Sepsis protocol underway in the ED. -Continue empiric IV Rocephin, IV azithromycin, Mucinex, neb treatments.   -PCCM following and I appreciate their input and recommendations.    3.  New onset atrial fibrillation -Patient noted to go into A-fib with RVR with heart rate sustaining greater than the 140s as high as 182 on 03/10/2022. -Likely secondary to sepsis, acute pulmonary issues.  Patient also noted to have an abnormal TSH. -Cardiac enzymes cycled were elevated but trending down (demand ischemia possibly). -2D echo with EF of 50 to 55%, NWMA, left ventricular diastolic parameters indeterminate, moderately reduced right ventricular systolic function, moderately enlarged right ventricular size, trivial MVR, no evidence of mitral stenosis.  Aortic valve sclerosis.  -TSH abnormal.  Elevated free T4. -Due to abnormal TSH we will try to avoid amiodarone for now. -Was on Cardizem drip and transitioned to oral Cardizem per cardiology and dose adjusted per cardiology to Cardizem CD 180 mg daily.   -Was on IV heparin and transitioned to Eliquis per cardiology.  -Cardiology following and appreciate input and recommendations.  #4: Volume overload/acute diastolic CHF exacerbation -Patient noted to have worsening respiratory status on 03/10/2022, seen by PCCM and felt to be in volume overload.  Patient noted to have an elevated BNP. - ?  Secondary to new onset A-fib versus aggressive fluid resuscitation on admission with sepsis. -Patient given Lasix 100 mg IV x1 on 03/10/2022 with a urine output of 2.8 L. -Patient given a dose of Lasix 40 mg IV x1 on 03/11/2022 with a urine output of 1.6 L over the past 24 hours. -Patient transition to oral Lasix urine output noted of 2.650 L over the past 24 hours. -Cardiac enzymes elevated but trended  down, 2D echo with EF of 50 to 55%, NWMA, left ventricular diastolic parameters indeterminate, moderately reduced right ventricular systolic function, moderately enlarged right ventricular size, trivial MVR, no evidence of mitral stenosis.  Aortic valve sclerosis.  -Patient with some diffuse wheezing, poor air movement, chest tightness on 03/12/2022 which has since improved today after adjustment of steroids, nebulizer treatments and on diuretics.  -Continue current dose of Lasix 20 mg twice daily per cardiology, patient also started on Jardiance as well per cardiology. -Cardiology following.   5.  Elevated troponin -Likely demand ischemia secondary to problem #1 and 2. -Serial troponins trending down.   -2D echo with EF of 50 to 55%, NWMA, left ventricular diastolic parameters indeterminate, moderately reduced right ventricular systolic function, moderately enlarged right ventricular size, trivial MVR, no evidence of mitral stenosis.  Aortic valve sclerosis.  -Continue aspirin.   -Patient in A-fib with RVR and was on Cardizem drip. -Heart rate improved and patient transitioned to oral Cardizem per cardiology.   -Heart rate still somewhat elevated and Cardizem dose adjusted/increased per cardiology.  -Cardiology consulted and following and appreciate input and recommendations.  -Supportive care.   6.  Hypertension -Patient currently in A-fib and as such IV Lopressor and Norvasc discontinued.   -Was on a Cardizem drip and transition to oral Cardizem per cardiology.  Patient also on oral Lasix.  BP soft today.    7.  History of alcohol use -Alcohol level noted to be < 10.  -Patient with no significant withdrawal noted on examination, except some minimal tremors noted.  -Due to somnolence on 03/10/2022, Librium detox protocol  discontinued by PCCM. -Continue the Ativan stepdown CIWA protocol as needed. -Continue thiamine, multivitamin, folic acid.   8.  Tobacco dependence -Tobacco  cessation. -Continue nicotine patch.   9.  Transaminitis -Likely secondary to history of alcohol use. -Improving. -Follow.   10.  Acute renal failure -Likely secondary to prerenal azotemia in the setting of HCTZ, ARB. -Urinalysis nitrite negative, leukocytes negative, 30 of protein, 0-5 WBCs.  Urine sodium at 32, urine creatinine at 144.   -Urine output with 1.64 L over after receiving Lasix 40 mg IV x1 on 03/11/2022.  Patient also received Lasix at 100 mg IV x1 on 03/10/2022.  -Patient transition to oral Lasix urine output of 2.650 L over the past 24 hours. -Renal function improving daily currently at 0.97 -Renal ultrasound unremarkable.   11.  Dehydration -IV fluids saline locked due to concerns for volume overload per PCCM.   12.  Abnormal TSH -TSH noted at 0.195.   -Free T4 of 1.39.   -Free T3 at 1.6. -Total T3 at 79. Thyroid-stimulating immunoglobin/thyroid antibodies pending -Patient noted in atrial fibrillation.   -Was on Cardizem drip for rate control and weaned off to oral Cardizem. -We will need outpatient follow-up with PCP for repeat TFTs and depending on results may need outpatient referral to endocrinology.  13.  Anemia/folate deficiency -Patient denies any overt bleeding however per RN it was noted that patient had some dark stool the evening of 03/10/2022.. -Hemoglobin stable at 11.0. -FOBT ordered and pending.  -Anemia panel consistent with anemia of chronic disease.  Folate level at 4.7.   -Continue folic acid daily.  -Follow H&H. -Transfusion threshold hemoglobin < 7   DVT prophylaxis: Heparin >>>> Eliquis Code Status: Full Family Communication: Updated patient.  No family at bedside. Disposition: SNF  Status is: Inpatient Remains inpatient appropriate because: Severity of illness   Consultants:  Cardiology: Dr. Harrell Gave 03/09/2022 PCCM: Dr. Chase Caller 03/09/2022  Procedures:  CT head 03/09/2022 Chest x-ray 03/09/2022 Renal ultrasound 03/10/2022 2D echo  03/11/2022 PICC line 03/10/2022  Antimicrobials:  IV Rocephin 03/09/2022>>>>> IV azithromycin 03/09/2022   Subjective: Sitting up in chair.  States breathing is better today than yesterday.  Noted to have been on BiPAP overnight for about 2 to 3 hours and currently on 4 L nasal cannula with sats of 95%.  Denies any chest pain.  No abdominal pain.  No bleeding.  Still with cough.   Objective: Vitals:   03/13/22 0815 03/13/22 0830 03/13/22 0854 03/13/22 0855  BP:      Pulse: 97 92    Resp: (!) 26 (!) 28    Temp:      TempSrc:      SpO2: 97% 94% 92% 90%  Weight:      Height:        Intake/Output Summary (Last 24 hours) at 03/13/2022 0943 Last data filed at 03/13/2022 0600 Gross per 24 hour  Intake 1200.56 ml  Output 2500 ml  Net -1299.44 ml    Filed Weights   03/10/22 1900 03/12/22 0500 03/13/22 0600  Weight: 79.1 kg 80.2 kg 78.4 kg    Examination:  General exam: Sitting up in chair on 4 L nasal cannula with sats of 95%.  Respiratory system: Less tight.  Fair air movement.  No significant wheezing noted.  No significant crackles.  Some tachypnea.   Cardiovascular system: Irregularly irregular.  No murmurs rubs or gallops.  No lower extremity edema.   Gastrointestinal system: Abdomen is soft, nontender, nondistended, positive bowel sounds.  No rebound.  No guarding. Central nervous system: Alert and oriented. No focal neurological deficits.  Moving extremities spontaneously. Extremities: Symmetric 5 x 5 power. Skin: No rashes, lesions or ulcers Psychiatry: Judgement and insight appear fair. Mood & affect appropriate.     Data Reviewed: I have personally reviewed following labs and imaging studies  CBC: Recent Labs  Lab 03/09/22 1400 03/09/22 1410 03/10/22 0258 03/10/22 2127 03/11/22 0430 03/12/22 0446 03/13/22 0440  WBC 34.3*  --  23.8* 22.2* 19.8* 21.1* 19.2*  NEUTROABS 29.8*  --  23.1*  --  18.6* 18.8* 17.1*  HGB 13.4   < > 11.3* 11.1* 10.5* 10.3* 11.0*  HCT 40.4    < > 33.6* 34.0* 32.4* 31.6* 34.2*  MCV 102.0*  --  99.7 102.4* 102.9* 102.9* 104.0*  PLT 368  --  275 291 303 336 373   < > = values in this interval not displayed.     Basic Metabolic Panel: Recent Labs  Lab 03/09/22 1400 03/09/22 1410 03/10/22 0258 03/10/22 1757 03/11/22 0430 03/12/22 0446 03/13/22 0440  NA 136   < > 138 136 138 138 138  K 3.6   < > 3.5 4.0 3.4* 4.3 4.3  CL 94*   < > 102 104 101 103 100  CO2 18*   < > '25 22 27 29 30  ' GLUCOSE 159*   < > 237* 285* 230* 161* 179*  BUN 46*   < > 39* 37* 34* 34* 30*  CREATININE 2.86*   < > 1.35* 1.15 1.12 0.94 0.97  CALCIUM 9.3   < > 8.6* 8.5* 8.5* 8.7* 8.9  MG 3.4*  --  3.2* 3.1*  --  3.2*  --   PHOS  --   --  4.3 3.1  --   --   --    < > = values in this interval not displayed.     GFR: Estimated Creatinine Clearance: 79.7 mL/min (by C-G formula based on SCr of 0.97 mg/dL).  Liver Function Tests: Recent Labs  Lab 03/09/22 1400 03/10/22 0258 03/11/22 0430 03/12/22 0446  AST 70* 125* 100* 75*  ALT 42 54* 66* 67*  ALKPHOS 96 70 81 91  BILITOT 1.0 0.6 0.4 0.4  PROT 8.5* 6.8 6.8 6.9  ALBUMIN 2.4* 1.9* 1.8* 1.8*     CBG: Recent Labs  Lab 03/12/22 1644 03/12/22 1924 03/12/22 2343 03/13/22 0334 03/13/22 0717  GLUCAP 181* 151* 141* 183* 176*      Recent Results (from the past 240 hour(s))  Blood culture (routine x 2)     Status: None (Preliminary result)   Collection Time: 03/09/22  2:00 PM   Specimen: BLOOD  Result Value Ref Range Status   Specimen Description BLOOD SITE NOT SPECIFIED  Final   Special Requests   Final    BOTTLES DRAWN AEROBIC AND ANAEROBIC Blood Culture adequate volume   Culture   Final    NO GROWTH 4 DAYS Performed at [facility], [place] [address] [place], [place] [address]    Report Status PENDING  Incomplete  Respiratory (~20 pathogens) panel by PCR     Status: None   Collection Time: 03/09/22  2:00 PM   Specimen: Nasopharyngeal Swab; Respiratory  Result Value Ref Range  Status   Adenovirus NOT DETECTED NOT DETECTED Final   Coronavirus 229E NOT DETECTED NOT DETECTED Final    Comment: (NOTE) The Coronavirus on the Respiratory Panel, DOES NOT test for the novel  Coronavirus (2019 nCoV)  Coronavirus HKU1 NOT DETECTED NOT DETECTED Final   Coronavirus NL63 NOT DETECTED NOT DETECTED Final   Coronavirus OC43 NOT DETECTED NOT DETECTED Final   Metapneumovirus NOT DETECTED NOT DETECTED Final   Rhinovirus / Enterovirus NOT DETECTED NOT DETECTED Final   Influenza A NOT DETECTED NOT DETECTED Final   Influenza B NOT DETECTED NOT DETECTED Final   Parainfluenza Virus 1 NOT DETECTED NOT DETECTED Final   Parainfluenza Virus 2 NOT DETECTED NOT DETECTED Final   Parainfluenza Virus 3 NOT DETECTED NOT DETECTED Final   Parainfluenza Virus 4 NOT DETECTED NOT DETECTED Final   Respiratory Syncytial Virus NOT DETECTED NOT DETECTED Final   Bordetella pertussis NOT DETECTED NOT DETECTED Final   Bordetella Parapertussis NOT DETECTED NOT DETECTED Final   Chlamydophila pneumoniae NOT DETECTED NOT DETECTED Final   Mycoplasma pneumoniae NOT DETECTED NOT DETECTED Final    Comment: Performed at [facility], [place] [address] [place], [place] [address]  Resp Panel by RT-PCR (Flu A&B, Covid) Anterior Nasal Swab     Status: None   Collection Time: 03/09/22  2:01 PM   Specimen: Anterior Nasal Swab  Result Value Ref Range Status   SARS Coronavirus 2 by RT PCR NEGATIVE NEGATIVE Final    Comment: (NOTE) SARS-CoV-2 target nucleic acids are NOT DETECTED.  The SARS-CoV-2 RNA is generally detectable in upper respiratory specimens during the acute phase of infection. The lowest concentration of SARS-CoV-2 viral copies this assay can detect is 138 copies/mL. A negative result does not preclude SARS-Cov-2 infection and should not be used as the sole basis for treatment or other patient management decisions. A negative result may occur with  improper specimen collection/handling,  submission of specimen other than nasopharyngeal swab, presence of viral mutation(s) within the areas targeted by this assay, and inadequate number of viral copies(<138 copies/mL). A negative result must be combined with clinical observations, patient history, and epidemiological information. The expected result is Negative.  Fact Sheet for Patients:  EntrepreneurPulse.com.au  Fact Sheet for Healthcare Providers:  IncredibleEmployment.be  This test is no t yet approved or cleared by the [place] FDA and  has been authorized for detection and/or diagnosis of SARS-CoV-2 by FDA under an Emergency Use Authorization (EUA). This EUA will remain  in effect (meaning this test can be used) for the duration of the COVID-19 declaration under Section 564(b)(1) of the Act, 21 U.S.C.section 360bbb-3(b)(1), unless the authorization is terminated  or revoked sooner.       Influenza A by PCR NEGATIVE NEGATIVE Final   Influenza B by PCR NEGATIVE NEGATIVE Final    Comment: (NOTE) The Xpert Xpress SARS-CoV-2/FLU/RSV plus assay is intended as an aid in the diagnosis of influenza from Nasopharyngeal swab specimens and should not be used as a sole basis for treatment. Nasal washings and aspirates are unacceptable for Xpert Xpress SARS-CoV-2/FLU/RSV testing.  Fact Sheet for Patients: EntrepreneurPulse.com.au  Fact Sheet for Healthcare Providers: IncredibleEmployment.be  This test is not yet approved or cleared by the [place] FDA and has been authorized for detection and/or diagnosis of SARS-CoV-2 by FDA under an Emergency Use Authorization (EUA). This EUA will remain in effect (meaning this test can be used) for the duration of the COVID-19 declaration under Section 564(b)(1) of the Act, 21 U.S.C. section 360bbb-3(b)(1), unless the authorization is terminated or revoked.  Performed at [facility], [place] [address], [place], [place] [address]   Blood culture (routine x 2)     Status: None (Preliminary  result)   Collection Time: 03/09/22  2:04 PM   Specimen: BLOOD  Result Value Ref Range Status   Specimen Description BLOOD BLOOD RIGHT FOREARM  Final   Special Requests   Final    BOTTLES DRAWN AEROBIC AND ANAEROBIC Blood Culture adequate volume   Culture   Final    NO GROWTH 4 DAYS Performed at [facility], [address] [place], [place] [address]    Report Status PENDING  Incomplete  Urine Culture     Status: None   Collection Time: 03/09/22  5:35 PM   Specimen: Urine, Catheterized  Result Value Ref Range Status   Specimen Description URINE, CATHETERIZED  Final   Special Requests NONE  Final   Culture   Final    NO GROWTH Performed at [facility], [address] [place], [place] [address]    Report Status 03/11/2022 FINAL  Final  MRSA Next Gen by PCR, Nasal     Status: None   Collection Time: 03/10/22  6:40 AM   Specimen: Urine, Catheterized; Nasal Swab  Result Value Ref Range Status   MRSA by PCR Next Gen NOT DETECTED NOT DETECTED Final    Comment: (NOTE) The GeneXpert MRSA Assay (FDA approved for NASAL specimens only), is one component of a comprehensive MRSA colonization surveillance program. It is not intended to diagnose MRSA infection nor to guide or monitor treatment for MRSA infections. Test performance is not FDA approved in patients less than 73 years old. Performed at [facility], [place] [address] [place], [place] [address]          Radiology Studies: DG CHEST PORT 1 VIEW  Result Date: 03/12/2022 CLINICAL DATA:  Pneumonia EXAM: PORTABLE CHEST 1 VIEW COMPARISON:  Previous studies including the examination of 03/10/2022 FINDINGS: Cardiac size is within normal limits. There is large infiltrate in right upper lobe. There is some decrease in opacity in the infiltrate. However, area of infiltrate has increased. There is new infiltrate in left lower  lung field and retrocardiac region suggesting pneumonia in left lower lobe. Rest of the lung fields are clear. There is no significant pleural effusion or pneumothorax. IMPRESSION: Large infiltrate is seen in right upper lobe with increasing area of pneumonia. Infiltrate in the right upper lobe appears less dense. There is new infiltrate in left lower lung fields suggesting new focus of pneumonia in left lower lobe. Electronically Signed   By: Elmer Picker M.D.   On: 03/12/2022 12:51        Scheduled Meds:  apixaban  5 mg Oral BID   budesonide (PULMICORT) nebulizer solution  0.5 mg Nebulization BID   Chlorhexidine Gluconate Cloth  6 each Topical Daily   diltiazem  180 mg Oral Daily   empagliflozin  10 mg Oral Daily   folic acid  1 mg Intravenous Daily   furosemide  20 mg Oral BID   guaiFENesin  1,200 mg Oral BID   insulin aspart  0-15 Units Subcutaneous Q4H   ipratropium  0.5 mg Nebulization QID   levalbuterol  0.63 mg Nebulization QID   loratadine  10 mg Oral Daily   LORazepam  0-4 mg Intravenous Q8H   methylPREDNISolone (SOLU-MEDROL) injection  60 mg Intravenous Q8H   multivitamin with minerals  1 tablet Oral Daily   nicotine  7 mg Transdermal Daily   mouth rinse  15 mL Mouth Rinse 4 times per day   pantoprazole  40 mg Oral BID AC  sodium chloride flush  10-40 mL Intracatheter Q12H   sodium chloride flush  3 mL Intravenous Q12H   thiamine  100 mg Oral Daily   Or   thiamine  100 mg Intravenous Daily   Continuous Infusions:  azithromycin Stopped (03/12/22 1628)   cefTRIAXone (ROCEPHIN)  IV Stopped (03/12/22 1507)   diltiazem (CARDIZEM) infusion Stopped (03/12/22 1030)     LOS: 4 days    Time spent: 45 minutes    Irine Seal, MD [facility]   To contact the attending provider between 7A-7P or the covering provider during after hours 7P-7A, please log into the web site [email or web address] and access using universal [place] password for that web site. If  you do not have the password, please call the hospital operator.  03/13/2022, 9:43 AM

## 2022-03-13 NOTE — Telephone Encounter (Signed)
Pharmacy Patient Advocate Encounter  Insurance verification completed.    The patient is insured through [employer] Part D   The patient is currently admitted and ran test claims for the following: Eliquis, Jardiance.  Copays and coinsurance results were relayed to Inpatient clinical team.

## 2022-03-14 DIAGNOSIS — J9601 Acute respiratory failure with hypoxia: Secondary | ICD-10-CM | POA: Diagnosis not present

## 2022-03-14 DIAGNOSIS — R7989 Other specified abnormal findings of blood chemistry: Secondary | ICD-10-CM | POA: Diagnosis not present

## 2022-03-14 DIAGNOSIS — J45901 Unspecified asthma with (acute) exacerbation: Secondary | ICD-10-CM | POA: Diagnosis not present

## 2022-03-14 DIAGNOSIS — N179 Acute kidney failure, unspecified: Secondary | ICD-10-CM | POA: Diagnosis not present

## 2022-03-14 DIAGNOSIS — J441 Chronic obstructive pulmonary disease with (acute) exacerbation: Secondary | ICD-10-CM | POA: Diagnosis not present

## 2022-03-14 LAB — CBC WITH DIFFERENTIAL/PLATELET
Abs Immature Granulocytes: 1.06 10*3/uL — ABNORMAL HIGH (ref 0.00–0.07)
Basophils Absolute: 0.1 10*3/uL (ref 0.0–0.1)
Basophils Relative: 1 %
Eosinophils Absolute: 0 10*3/uL (ref 0.0–0.5)
Eosinophils Relative: 0 %
HCT: 33.5 % — ABNORMAL LOW (ref 39.0–52.0)
Hemoglobin: 10.8 g/dL — ABNORMAL LOW (ref 13.0–17.0)
Immature Granulocytes: 5 %
Lymphocytes Relative: 4 %
Lymphs Abs: 0.7 10*3/uL (ref 0.7–4.0)
MCH: 33.1 pg (ref 26.0–34.0)
MCHC: 32.2 g/dL (ref 30.0–36.0)
MCV: 102.8 fL — ABNORMAL HIGH (ref 80.0–100.0)
Monocytes Absolute: 0.9 10*3/uL (ref 0.1–1.0)
Monocytes Relative: 4 %
Neutro Abs: 17.7 10*3/uL — ABNORMAL HIGH (ref 1.7–7.7)
Neutrophils Relative %: 86 %
Platelets: 408 10*3/uL — ABNORMAL HIGH (ref 150–400)
RBC: 3.26 MIL/uL — ABNORMAL LOW (ref 4.22–5.81)
RDW: 12.7 % (ref 11.5–15.5)
WBC: 20.5 10*3/uL — ABNORMAL HIGH (ref 4.0–10.5)
nRBC: 0.5 % — ABNORMAL HIGH (ref 0.0–0.2)

## 2022-03-14 LAB — BASIC METABOLIC PANEL
Anion gap: 8 (ref 5–15)
BUN: 26 mg/dL — ABNORMAL HIGH (ref 8–23)
CO2: 30 mmol/L (ref 22–32)
Calcium: 9 mg/dL (ref 8.9–10.3)
Chloride: 104 mmol/L (ref 98–111)
Creatinine, Ser: 0.82 mg/dL (ref 0.61–1.24)
GFR, Estimated: >60 mL/min (ref >60)
Glucose, Bld: 124 mg/dL — ABNORMAL HIGH (ref 70–99)
Potassium: 4.3 mmol/L (ref 3.5–5.1)
Sodium: 142 mmol/L (ref 135–145)

## 2022-03-14 LAB — GLUCOSE, CAPILLARY
Glucose-Capillary: 115 mg/dL — ABNORMAL HIGH (ref 70–99)
Glucose-Capillary: 120 mg/dL — ABNORMAL HIGH (ref 70–99)
Glucose-Capillary: 214 mg/dL — ABNORMAL HIGH (ref 70–99)
Glucose-Capillary: 248 mg/dL — ABNORMAL HIGH (ref 70–99)
Glucose-Capillary: 153 mg/dL — ABNORMAL HIGH (ref 70–99)

## 2022-03-14 LAB — THYROID STIMULATING IMMUNOGLOBULIN: Thyroid Stimulating Immunoglob: <0.1 IU/L (ref 0.00–0.55)

## 2022-03-14 LAB — CULTURE, BLOOD (ROUTINE X 2)
Culture: NO GROWTH
Culture: NO GROWTH
Special Requests: ADEQUATE
Special Requests: ADEQUATE

## 2022-03-14 LAB — PATHOLOGIST SMEAR REVIEW: Path Review: NEGATIVE

## 2022-03-14 MED ORDER — METHYLPREDNISOLONE SODIUM SUCC 125 MG IJ SOLR
120.0000 mg | INTRAMUSCULAR | Status: DC
Start: 2022-03-15 — End: 2022-03-17
  Administered 2022-03-15 – 2022-03-16 (×2): 120 mg via INTRAVENOUS
  Filled 2022-03-14 (×2): qty 2

## 2022-03-14 MED ORDER — FOLIC ACID 1 MG PO TABS
1.0000 mg | ORAL_TABLET | Freq: Every day | ORAL | Status: DC
  Administered 2022-03-14 – 2022-03-23 (×10): 1 mg via ORAL
  Filled 2022-03-14 (×10): qty 1

## 2022-03-14 MED ORDER — DILTIAZEM HCL ER 60 MG PO CP12
60.0000 mg | ORAL_CAPSULE | Freq: Once | ORAL | Status: AC
Start: 2022-03-14 — End: 2022-03-14
  Administered 2022-03-14: 60 mg via ORAL
  Filled 2022-03-14: qty 1

## 2022-03-14 MED ORDER — DILTIAZEM HCL ER COATED BEADS 240 MG PO CP24
240.0000 mg | ORAL_CAPSULE | Freq: Every day | ORAL | Status: DC
  Filled 2022-03-14: qty 1

## 2022-03-14 NOTE — Progress Notes (Signed)
CPT held at this time. Patient sitting in chair.

## 2022-03-14 NOTE — Progress Notes (Signed)
Rounding Note    Patient Name: Mark [name] Date of Encounter: 03/14/2022  [facility] Cardiologist: None   Subjective   confusion noted. Sitting in chair. No CP.  Inpatient Medications    Scheduled Meds:  alteplase  2 mg Intracatheter Once   alteplase  2 mg Intracatheter Once   apixaban  5 mg Oral BID   budesonide (PULMICORT) nebulizer solution  0.5 mg Nebulization BID   Chlorhexidine Gluconate Cloth  6 each Topical Daily   diltiazem  180 mg Oral Daily   empagliflozin  10 mg Oral Daily   folic acid  1 mg Oral Daily   furosemide  20 mg Oral BID   guaiFENesin  1,200 mg Oral BID   insulin aspart  0-15 Units Subcutaneous Q4H   ipratropium  0.5 mg Nebulization QID   levalbuterol  0.63 mg Nebulization QID   loratadine  10 mg Oral Daily   LORazepam  0-4 mg Intravenous Q8H   methylPREDNISolone (SOLU-MEDROL) injection  60 mg Intravenous Q8H   multivitamin with minerals  1 tablet Oral Daily   nicotine  7 mg Transdermal Daily   mouth rinse  15 mL Mouth Rinse 4 times per day   pantoprazole  40 mg Oral BID AC   sodium chloride flush  10-40 mL Intracatheter Q12H   sodium chloride flush  10-40 mL Intracatheter Q12H   sodium chloride flush  3 mL Intravenous Q12H   thiamine  100 mg Oral Daily   Or   thiamine  100 mg Intravenous Daily   Continuous Infusions:  azithromycin 500 mg (03/13/22 1530)   cefTRIAXone (ROCEPHIN)  IV 2 g (03/13/22 1427)   PRN Meds: acetaminophen **OR** acetaminophen, hydrALAZINE, ipratropium, levalbuterol, meclizine, [DISCONTINUED] ondansetron **OR** ondansetron (ZOFRAN) IV, mouth rinse, oxyCODONE, polyethylene glycol, sodium chloride flush, sodium chloride flush, sorbitol, traZODone   Vital Signs    Vitals:   03/14/22 0600 03/14/22 0700 03/14/22 0913 03/14/22 0915  BP: (!) 126/94 (!) 137/92    Pulse:      Resp: (!) 22 (!) 35    Temp:  (!) 97.5 F (36.4 C)    TempSrc:  Axillary    SpO2: (!) 84% (!) 80% 93% 91%  Weight:      Height:         Intake/Output Summary (Last 24 hours) at 03/14/2022 0954 Last data filed at 03/14/2022 0600 Gross per 24 hour  Intake 303 ml  Output 2400 ml  Net -2097 ml      03/14/2022    5:00 AM 03/13/2022    6:00 AM 03/12/2022    5:00 AM  Last 3 Weights  Weight (lbs) 170 lb 3.1 oz 172 lb 13.5 oz 176 lb 12.9 oz  Weight (kg) 77.2 kg 78.4 kg 80.2 kg      Telemetry    Atrial fibrillation heart rate currently 100-110- Personally Reviewed  ECG    A-fib with RVR- Personally Reviewed  Physical Exam   Irregular heart rhythm mild wheezing comfortable  Labs    High Sensitivity Troponin:   Recent Labs  Lab 03/09/22 1400 03/09/22 1653 03/09/22 1927 03/09/22 2250  TROPONINIHS 181* 150* 143* 118*     Chemistry Recent Labs  Lab 03/10/22 0258 03/10/22 1757 03/11/22 0430 03/12/22 0446 03/13/22 0440 03/14/22 0402  NA 138 136 138 138 138 142  K 3.5 4.0 3.4* 4.3 4.3 4.3  CL 102 104 101 103 100 104  CO2 25 22 27 29 30 30   GLUCOSE 237* 285* 230*  161* 179* 124*  BUN 39* 37* 34* 34* 30* 26*  CREATININE 1.35* 1.15 1.12 0.94 0.97 0.82  CALCIUM 8.6* 8.5* 8.5* 8.7* 8.9 9.0  MG 3.2* 3.1*  --  3.2*  --   --   PROT 6.8  --  6.8 6.9  --   --   ALBUMIN 1.9*  --  1.8* 1.8*  --   --   AST 125*  --  100* 75*  --   --   ALT 54*  --  66* 67*  --   --   ALKPHOS 70  --  81 91  --   --   BILITOT 0.6  --  0.4 0.4  --   --   GFRNONAA 57* >60 >60 >60 >60 >60  ANIONGAP 11 10 10 6 8 8     Lipids No results for input(s): "CHOL", "TRIG", "HDL", "LABVLDL", "LDLCALC", "CHOLHDL" in the last 168 hours.  Hematology Recent Labs  Lab 03/12/22 0446 03/13/22 0440 03/14/22 0402  WBC 21.1* 19.2* 20.5*  RBC 3.07* 3.29* 3.26*  HGB 10.3* 11.0* 10.8*  HCT 31.6* 34.2* 33.5*  MCV 102.9* 104.0* 102.8*  MCH 33.6 33.4 33.1  MCHC 32.6 32.2 32.2  RDW 13.0 12.7 12.7  PLT 336 373 408*   Thyroid  Recent Labs  Lab 03/09/22 1402 03/10/22 0258  TSH 0.195*  --   FREET4  --  1.39*    BNP Recent Labs  Lab  03/09/22 1400 03/11/22 0500 03/12/22 0446  BNP 1,309.2* 586.3* 642.5*    DDimer No results for input(s): "DDIMER" in the last 168 hours.   Radiology    DG CHEST PORT 1 VIEW  Result Date: 03/12/2022 CLINICAL DATA:  Pneumonia EXAM: PORTABLE CHEST 1 VIEW COMPARISON:  Previous studies including the examination of 03/10/2022 FINDINGS: Cardiac size is within normal limits. There is large infiltrate in right upper lobe. There is some decrease in opacity in the infiltrate. However, area of infiltrate has increased. There is new infiltrate in left lower lung field and retrocardiac region suggesting pneumonia in left lower lobe. Rest of the lung fields are clear. There is no significant pleural effusion or pneumothorax. IMPRESSION: Large infiltrate is seen in right upper lobe with increasing area of pneumonia. Infiltrate in the right upper lobe appears less dense. There is new infiltrate in left lower lung fields suggesting new focus of pneumonia in left lower lobe. Electronically Signed   By: 05/10/2022 M.D.   On: 03/12/2022 12:51    Cardiac Studies   ECHO 03/11/22    1. Abnormal septal motion . Left ventricular ejection fraction, by  estimation, is 50 to 55%. The left ventricle has low normal function. The  left ventricle has no regional wall motion abnormalities. Left ventricular  diastolic parameters are  indeterminate.   2. Right ventricular systolic function is moderately reduced. The right  ventricular size is moderately enlarged.   3. The mitral valve is abnormal. Trivial mitral valve regurgitation. No  evidence of mitral stenosis.   4. The aortic valve is tricuspid. There is mild calcification of the  aortic valve. There is mild thickening of the aortic valve. Aortic valve  regurgitation is not visualized. Aortic valve sclerosis is present, with  no evidence of aortic valve stenosis.   5. The inferior vena cava is dilated in size with >50% respiratory  variability, suggesting  right atrial pressure of 8 mmHg.   Patient Profile     71 y.o. male with A-fib RVR, sepsis, alcohol  use, smoker  Assessment & Plan    A-fib with RVR Acute diastolic heart failure - diltiazem 120 CD started 9/4, I will increase again to 240 from 180 yesterday -Prior heart rates were as high as 182 on 03/10/2022 driven by pneumonia, sepsis.  EF 55%, elevated free T4.  Trying to avoid amiodarone.   -- Now on Eliquis 5 mg twice a day for anticoagulation. -Lasix low-dose 20 mg twice daily to keep even.  On Jardiance 10 mg as well.  Elevated troponin secondary to type II MI with underlying pulmonary disease - Likely secondary to sepsis, COPD, atrial fibrillation with rapid ventricular response -Continue with supportive care.  Does not require cardiac rehab etc.  Alcohol use Delirium - Has fallen in the past.  May not be a good long-term anticoagulation candidate.    For questions or updates, please contact [place] Please consult [email or web address] for contact info under        Signed, Candee Furbish, MD  03/14/2022, 9:54 AM

## 2022-03-14 NOTE — Progress Notes (Signed)
NAME:  Mark [name], MRN:  [id number], DOB:  1950-10-29, LOS: 5 ADMISSION DATE:  03/09/2022, CONSULTATION DATE: 9/1 REFERRING MD: Janee Morn, REASON FOR CONSULT: Acute respiratory failure with hypoxia  History of Present Illness:  Mark [name] is an 71 y.o. male who presented to [facility] on 9/1 after EMS was called for dark red/brown stool per rectum, on arrival EMS found the patient in respiratory distress.  He has a pertinent past medical history of asthma, EtOH abuse, hypertension.  ED work-up notable for WBC 34.3, Lactate 7.6 > 4.6, Creatinine 2.86 (baseline 0.9-1),BNP 1.3K, troponin 181> 150.  CXR concerning for RUL PNU with acute asthma exacerbation. A code sepsis was called.  Blood cultures, urine cultures, respirate culture ordered.  Patient was started on IV Rocephin and IV azithromycin.  Per hospitalist notes patient hypoxic and emergency department.  VBG 7.27/46/57/22, patient was started on BiPAP.  Patient was admitted to the hospital service.  PCCM was consulted for acute respiratory failure with hypoxia.  Pertinent  Medical History   Past Medical History:  Diagnosis Date   Allergic rhinitis    Asthma    BPH (benign prostatic hyperplasia)    Carpal tunnel syndrome    Hypertension      Significant Hospital Events: Including procedures, antibiotic start and stop dates in addition to other pertinent events   9/1 presented [facility].  Treated as RUL pneumonia with sepsis. 9/2 remains short of breath on BiPAP.  Obtunded. 9/3 TTE> LVEF 55-60%, RV mildly enlarged 9/4 called back to assess for dyspnea  Interim History / Subjective:  SOB improving today.  Objective   Blood pressure (!) 134/97, pulse (!) 112, temperature 97.7 F (36.5 C), temperature source Oral, resp. rate (!) 29, height 6\' 2"  (1.88 m), weight 77.2 kg, SpO2 96 %.        Intake/Output Summary (Last 24 hours) at 03/14/2022 1505 Last data filed at 03/14/2022 0700 Gross per 24 hour  Intake 350  ml  Output 1825 ml  Net -1475 ml    Filed Weights   03/12/22 0500 03/13/22 0600 03/14/22 0500  Weight: 80.2 kg 78.4 kg 77.2 kg    Examination:  General: elderly man sitting up in the chair receiving breathing treatment HENT: [place]/AT, eyes anicteric PULM: Wheezing resolved, CTAB. No tachypnea or conversational dyspnea. CV: S1S2, irreg rhythm, tachycardic GI: soft, NT MSK: appropriate muscle tone, mild LE edema, no cyanosis Neuro: awake and alert, answering questions appropriately   BUN  26 Cr 0.82 WBC 20.5 H/H 10.8/33.5 Platelets 408  Ancillary tests personally reviewed    Assessment & Plan:   Atrial fibrillation with RVR Acute respiratory failure with hypoxemia from RUL, RLL pneumonia Acute COPD exacerbation due to pneumonia Acute diastolic heart failure Acute metabolic encephalopathy either due to underying dementia or acute EtOH withdrawal Tobacco abuse  Discussion: -Completed antibiotics; can lengthen course if febrile -wean steroids to 60mg  Q12h  -QHS BiPAP PRN -pulmonary hygiene -con't nebs; will need LABA/LAMA + albuterol at discharge -With his history of ETOH abuse, at risk for more severe pneumonia -nicotine replacement therapy, needs counseling to avoid future tobacco use -CIWA, vitamins -monitor for aspiration  PCCM will con't to follow intermittently. Please call with questions in the interim.   Best Practice (right click and "Reselect all SmartList Selections" daily)   Diet/type: Regular consistency (see orders) DVT prophylaxis: DOAC GI prophylaxis: PPI Lines: N/A Foley:  N/A Code Status:  full code Last date of multidisciplinary goals of care discussion [Full scope 9/1]  Steffanie Dunn, DO 03/14/22 3:17 PM [facility]

## 2022-03-14 NOTE — TOC Initial Note (Signed)
Transition of Care [facility]) - Initial/Assessment Note    Patient Details  Name: Mark [name] MRN: [id number] Date of Birth: April 07, 1951  [facility] CM/SW Contact:    Mark [name], LCSWA Phone Number: 03/14/2022, 12:49 PM  Clinical Narrative:                  CSW received consult for possible SNF placement at time of discharge. CSW spoke with patient and with patients permission patients son Mark [name] regarding PT recommendation of SNF placement at time of discharge. Patient and patients son reports patient comes from home alone. Patient and patients son Mark [name] expressed understanding of PT recommendation for patient and is agreeable to SNF placement at time of discharge. Patient and patients son gave CSW permission to fax out initial referral near the [place] area.CSW discussed insurance authorization process and will provide Medicare SNF ratings list will accepted SNF bed offers when available. Patient reports he has received the COVID vaccines as well as 1 booster. All question answered. No further questions reported at this time.CSW to continue to follow and assist with discharge planning needs.   Expected Discharge Plan: Skilled Nursing Facility Barriers to Discharge: Continued Medical Work up   Patient Goals and CMS Choice Patient states their goals for this hospitalization and ongoing recovery are:: SNF CMS Medicare.gov Compare Post Acute Care list provided to:: Patient Choice offered to / list presented to : Patient, Adult Children (patient and patients son [place])  Expected Discharge Plan and Services Expected Discharge Plan: Skilled Nursing Facility In-house Referral: Clinical Social Work Discharge Planning Services: CM Consult Post Acute Care Choice: Home Health, Skilled Nursing Facility Living arrangements for the past 2 months: Single Family Home                                      Prior Living Arrangements/Services Living arrangements for the past 2  months: Single Family Home Lives with:: Self Patient language and need for interpreter reviewed:: Yes Do you feel safe going back to the place where you live?: No   SNF  Need for Family Participation in Patient Care: Yes (Comment) Care giver support system in place?: Yes (comment) Current home services: DME ([employer], walker) Criminal Activity/Legal Involvement Pertinent to Current Situation/Hospitalization: No - Comment as needed  Activities of Daily Living Home Assistive Devices/Equipment: None ADL Screening (condition at time of admission) Patient's cognitive ability adequate to safely complete daily activities?: Yes Is the patient deaf or have difficulty hearing?: No Does the patient have difficulty seeing, even when wearing glasses/contacts?: No Does the patient have difficulty concentrating, remembering, or making decisions?: No Patient able to express need for assistance with ADLs?: Yes Does the patient have difficulty dressing or bathing?: Yes Independently performs ADLs?: No Communication: Independent Dressing (OT): Needs assistance Is this a change from baseline?: Change from baseline, expected to last <3days Grooming: Needs assistance Is this a change from baseline?: Change from baseline, expected to last <3 days Feeding: Independent Bathing: Needs assistance Is this a change from baseline?: Change from baseline, expected to last <3 days Toileting: Needs assistance Is this a change from baseline?: Change from baseline, expected to last <3 days In/Out Bed: Needs assistance Is this a change from baseline?: Change from baseline, expected to last <3 days Walks in Home: Needs assistance Is this a change from baseline?: Change from baseline, expected to last <3 days Does the patient have difficulty  walking or climbing stairs?: Yes Weakness of Legs: Both Weakness of Arms/Hands: None  Permission Sought/Granted Permission sought to share information with : Case Manager, Family  Supports, Magazine features editor Permission granted to share information with : Yes, Verbal Permission Granted  Share Information with NAME: Mark [name]  Permission granted to share info w AGENCY: SNF  Permission granted to share info w Relationship: Son  Permission granted to share info w Contact Information: Mark [name]  [phone]  Emotional Assessment Appearance:: Appears stated age Attitude/Demeanor/Rapport: Gracious Affect (typically observed): Calm Orientation: : Oriented to Self, Oriented to Place, Oriented to  Time Alcohol / Substance Use: Not Applicable Psych Involvement: No (comment)  Admission diagnosis:  Elevated troponin [R77.8] Chronic alcohol abuse [F10.10] Acute respiratory failure with hypoxia (HCC) [J96.01] Severe persistent asthma with acute exacerbation [J45.51] AKI (acute kidney injury) (HCC) [N17.9] Severe sepsis (HCC) [A41.9, R65.20] Pneumonia of right lung due to infectious organism, unspecified part of lung [J18.9] Patient Active Problem List   Diagnosis Date Noted   COPD with acute exacerbation (HCC)    Hypervolemia    Atrial fibrillation with RVR (HCC) 03/10/2022   Acute respiratory failure with hypoxia (HCC) 03/09/2022   Sepsis due to pneumonia (HCC) 03/09/2022   Elevated troponin 03/09/2022   EtOH dependence (HCC) 03/09/2022   Tobacco abuse 03/09/2022   PNA (pneumonia) 03/09/2022   ARF (acute renal failure) (HCC) 03/09/2022   Dehydration 03/09/2022   Abnormal TSH 03/09/2022   Chronic alcohol abuse    Severe persistent asthma with acute exacerbation    AKI (acute kidney injury) (HCC)    Severe sepsis (HCC)    Asthma exacerbation 11/10/2019   Tremor 11/10/2019   Alcohol abuse with intoxication (HCC) 11/10/2019   Hypertensive urgency 11/10/2019   Transaminitis 11/10/2019   PCP:  Hillery Aldo, NP Pharmacy:   [facility] [phone] [place], [place] - [address] AT [facility] [address] [place] [address] Phone:  [phone] Fax: [phone]  [facility] [address] [place] [address] Phone: [phone] Fax: [phone]     Social Determinants of Health (SDOH) Interventions    Readmission Risk Interventions     No data to display

## 2022-03-14 NOTE — Progress Notes (Signed)
PROGRESS NOTE    Mark [name]  PXT:[id number] DOB: June 12, 1951 DOA: 03/09/2022 PCP: Hillery Aldo, NP    Brief Narrative:  Patient 70 year old male with past medical history of asthma, alcohol dependence, hypertension, BPH, allergic rhinitis presented to the ED with 4-day history of productive cough, brownish sputum, acute respiratory distress requiring BiPAP.  Patient noted on work-up to be septic with concern for right upper lobe pneumonia, and also had rapid atrial fibrillation with RVR on 03/10/2022.  Patient placed on Cardizem drip and subsequently transition to oral Cardizem.  PCCM and cardiology following.  Assessment & Plan:   Principal Problem:   Acute respiratory failure with hypoxia (HCC) Active Problems:   Sepsis due to pneumonia (HCC)   Asthma exacerbation   Transaminitis   Elevated troponin   EtOH dependence (HCC)   Tobacco abuse   PNA (pneumonia)   ARF (acute renal failure) (HCC)   Dehydration   Abnormal TSH   Chronic alcohol abuse   Severe persistent asthma with acute exacerbation   AKI (acute kidney injury) (HCC)   Severe sepsis (HCC)   Atrial fibrillation with RVR (HCC)   Hypervolemia   COPD with acute exacerbation (HCC)  Acute respiratory failure with hypoxia . -Likely multifactorial secondary to right upper lobe pneumonia in the setting of an acute asthma exacerbation/COPD exacerbation and concern for volume overload/CHF exacerbation.  Initially had received a CPAP Solu-Medrol, BiPAP.  Currently on 4 L of nasal cannula oxygen.  Has completed course of antibiotic for pneumonia.  Currently on oral Lasix twice daily.  Was initially in the ICU for respiratory failure.  Continue DuoNebs and supportive care.  Sepsis secondary to pneumonia,  Negative blood cultures so far.  Respiratory viral panel was negative.  Lactate was significantly elevated on presentation which has improved at this time.  MRSA PCR and urinary Legionella negative.  Urine pneumococcus antigen  negative.  Has completed course of antibiotic with Rocephin and Zithromax.  New onset atrial fibrillation Likely multifactorial secondary to sepsis, pulmonary issues and abnormal TSH.-2D echo with EF of 50 to 55%. TSH abnormal.  Elevated free T4.  Due to abnormal TSH amiodarone was not started.  Currently on Cardizem long-acting as per cardiology.  Was on IV heparin which has been transitioned to Eliquis.  Volume overload/acute diastolic CHF exacerbation Received IV fluids with sepsis diagnosis.  Received Lasix with improved status.  D echocardiogram with preserved LV function.  Continue oral Lasix twice daily.  Continue Jardiance.  Elevated troponin -Likely demand ischemia.  Cardiology on board.  2D echocardiogram with LV ejection fraction of 50 to 55% with no regional wall motion abnormalities.  Continue aspirin.  Essential hypertension Cardiology on board was on IV Cardizem and has been changed to oral.  Continue Lasix.   History of alcohol use Continue as needed Ativan CIWA protocol.  Continue thiamine multivitamin folic acid.  Tobacco dependence -Continue nicotine patch.  Elevated LFT. Latest AST at 75, ALT 67.  History of alcohol use.  Check LFTs in AM.   Acute renal failure Improved.  Likely secondary to prerenal azotemia in the setting of HCTZ, ARB.  UA was negative.  Had received Lasix during hospitalization.  Has been transitioned to oral Lasix.  Renal function improving.  Latest creatinine at 0.8.   Volume depletion Now concern for volume overload.   Abnormal TSH -TSH noted at 0.195.  Free T4 of 1.39.  Free T3 at 1.6. Total T3 at 79. Thyroid-stimulating immunoglobin less than 0.10.  Will need outpatient PCP follow-up  for this.  Anemia/folate deficiency Hemoglobin stable at 10.8 from 11.0.  FOBT pending.  Anemia panel consistent with anemia of chronic disease.  Folate level at 4.7 continue folic acid.  Transfuse for hemoglobin less than 7.  DVT prophylaxis:  Eliquis  Code Status: Full  Family Communication:   No family at bedside.  Disposition: SNF  Status is: Inpatient Remains inpatient appropriate because: Severity of illness, pending placement.   Consultants:  Cardiology: Dr. Harrell Gave 03/09/2022 PCCM: Dr. Chase Caller 03/09/2022  Procedures:   PICC line 03/10/2022  Antimicrobials:  IV Rocephin 03/09/2022>>>>> IV azithromycin 03/09/2022   Subjective: Today, patient was seen and examined at bedside.  Denies any pain, nausea, vomiting, fever or chills but has some shortness of breath and cough.   Objective: Vitals:   03/14/22 1245 03/14/22 1300 03/14/22 1315 03/14/22 1330  BP:  (!) 134/97    Pulse: 99 (!) 110 (!) 119 (!) 112  Resp: (!) 27 (!) 23 (!) 29 (!) 29  Temp:      TempSrc:      SpO2: 97% (!) 87% (!) 77% 96%  Weight:      Height:        Intake/Output Summary (Last 24 hours) at 03/14/2022 1534 Last data filed at 03/14/2022 0700 Gross per 24 hour  Intake 350 ml  Output 1825 ml  Net -1475 ml    Filed Weights   03/12/22 0500 03/13/22 0600 03/14/22 0500  Weight: 80.2 kg 78.4 kg 77.2 kg    Physical examination: General:  Average built, not in obvious distress, on 4 L of oxygen by nasal cannula HENT:   No scleral pallor or icterus noted. Oral mucosa is moist.  Chest:    Diminished breath sounds bilaterally.  CVS: S1 &S2 heard. No murmur.  Regular rate and rhythm. Abdomen: Soft, nontender, nondistended.  Bowel sounds are heard.  External urinary catheter in place. Extremities: No cyanosis, clubbing or edema.  Peripheral pulses are palpable.  Right upper extremity PICC line in place. Psych: Alert, awake and oriented, normal mood CNS:  No cranial nerve deficits.  Power equal in all extremities.   Skin: Warm and dry.  No rashes noted.  Data Reviewed: I have personally reviewed the following labs and imaging studies.    CBC: Recent Labs  Lab 03/10/22 0258 03/10/22 2127 03/11/22 0430 03/12/22 0446 03/13/22 0440  03/14/22 0402  WBC 23.8* 22.2* 19.8* 21.1* 19.2* 20.5*  NEUTROABS 23.1*  --  18.6* 18.8* 17.1* 17.7*  HGB 11.3* 11.1* 10.5* 10.3* 11.0* 10.8*  HCT 33.6* 34.0* 32.4* 31.6* 34.2* 33.5*  MCV 99.7 102.4* 102.9* 102.9* 104.0* 102.8*  PLT 275 291 303 336 373 408*    Basic Metabolic Panel: Recent Labs  Lab 03/09/22 1400 03/09/22 1410 03/10/22 0258 03/10/22 1757 03/11/22 0430 03/12/22 0446 03/13/22 0440 03/14/22 0402  NA 136   < > 138 136 138 138 138 142  K 3.6   < > 3.5 4.0 3.4* 4.3 4.3 4.3  CL 94*   < > 102 104 101 103 100 104  CO2 18*   < > 25 22 27 29 30 30   GLUCOSE 159*   < > 237* 285* 230* 161* 179* 124*  BUN 46*   < > 39* 37* 34* 34* 30* 26*  CREATININE 2.86*   < > 1.35* 1.15 1.12 0.94 0.97 0.82  CALCIUM 9.3   < > 8.6* 8.5* 8.5* 8.7* 8.9 9.0  MG 3.4*  --  3.2* 3.1*  --  3.2*  --   --  PHOS  --   --  4.3 3.1  --   --   --   --    < > = values in this interval not displayed.     GFR: Estimated Creatinine Clearance: 92.8 mL/min (by C-G formula based on SCr of 0.82 mg/dL).  Liver Function Tests: Recent Labs  Lab 03/09/22 1400 03/10/22 0258 03/11/22 0430 03/12/22 0446  AST 70* 125* 100* 75*  ALT 42 54* 66* 67*  ALKPHOS 96 70 81 91  BILITOT 1.0 0.6 0.4 0.4  PROT 8.5* 6.8 6.8 6.9  ALBUMIN 2.4* 1.9* 1.8* 1.8*     CBG: Recent Labs  Lab 03/13/22 1944 03/13/22 2357 03/14/22 0408 03/14/22 0655 03/14/22 1144  GLUCAP 141* 145* 115* 120* 214*      Recent Results (from the past 240 hour(s))  Blood culture (routine x 2)     Status: None   Collection Time: 03/09/22  2:00 PM   Specimen: BLOOD  Result Value Ref Range Status   Specimen Description BLOOD SITE NOT SPECIFIED  Final   Special Requests   Final    BOTTLES DRAWN AEROBIC AND ANAEROBIC Blood Culture adequate volume   Culture   Final    NO GROWTH 5 DAYS Performed at [facility], [address], [place], [place] [address]    Report Status 03/14/2022 FINAL  Final  Respiratory (~20 pathogens)  panel by PCR     Status: None   Collection Time: 03/09/22  2:00 PM   Specimen: Nasopharyngeal Swab; Respiratory  Result Value Ref Range Status   Adenovirus NOT DETECTED NOT DETECTED Final   Coronavirus 229E NOT DETECTED NOT DETECTED Final    Comment: (NOTE) The Coronavirus on the Respiratory Panel, DOES NOT test for the novel  Coronavirus (2019 nCoV)    Coronavirus HKU1 NOT DETECTED NOT DETECTED Final   Coronavirus NL63 NOT DETECTED NOT DETECTED Final   Coronavirus OC43 NOT DETECTED NOT DETECTED Final   Metapneumovirus NOT DETECTED NOT DETECTED Final   Rhinovirus / Enterovirus NOT DETECTED NOT DETECTED Final   Influenza A NOT DETECTED NOT DETECTED Final   Influenza B NOT DETECTED NOT DETECTED Final   Parainfluenza Virus 1 NOT DETECTED NOT DETECTED Final   Parainfluenza Virus 2 NOT DETECTED NOT DETECTED Final   Parainfluenza Virus 3 NOT DETECTED NOT DETECTED Final   Parainfluenza Virus 4 NOT DETECTED NOT DETECTED Final   Respiratory Syncytial Virus NOT DETECTED NOT DETECTED Final   Bordetella pertussis NOT DETECTED NOT DETECTED Final   Bordetella Parapertussis NOT DETECTED NOT DETECTED Final   Chlamydophila pneumoniae NOT DETECTED NOT DETECTED Final   Mycoplasma pneumoniae NOT DETECTED NOT DETECTED Final    Comment: Performed at [facility], [address] [place], [place] [address]  Resp Panel by RT-PCR (Flu A&B, Covid) Anterior Nasal Swab     Status: None   Collection Time: 03/09/22  2:01 PM   Specimen: Anterior Nasal Swab  Result Value Ref Range Status   SARS Coronavirus 2 by RT PCR NEGATIVE NEGATIVE Final    Comment: (NOTE) SARS-CoV-2 target nucleic acids are NOT DETECTED.  The SARS-CoV-2 RNA is generally detectable in upper respiratory specimens during the acute phase of infection. The lowest concentration of SARS-CoV-2 viral copies this assay can detect is 138 copies/mL. A negative result does not preclude SARS-Cov-2 infection and should not be used as the sole  basis for treatment or other patient management decisions. A negative result may occur with  improper specimen collection/handling,  submission of specimen other than nasopharyngeal swab, presence of viral mutation(s) within the areas targeted by this assay, and inadequate number of viral copies(<138 copies/mL). A negative result must be combined with clinical observations, patient history, and epidemiological information. The expected result is Negative.  Fact Sheet for Patients:  EntrepreneurPulse.com.au  Fact Sheet for Healthcare Providers:  IncredibleEmployment.be  This test is no t yet approved or cleared by the [place] FDA and  has been authorized for detection and/or diagnosis of SARS-CoV-2 by FDA under an Emergency Use Authorization (EUA). This EUA will remain  in effect (meaning this test can be used) for the duration of the COVID-19 declaration under Section 564(b)(1) of the Act, 21 U.S.C.section 360bbb-3(b)(1), unless the authorization is terminated  or revoked sooner.       Influenza A by PCR NEGATIVE NEGATIVE Final   Influenza B by PCR NEGATIVE NEGATIVE Final    Comment: (NOTE) The Xpert Xpress SARS-CoV-2/FLU/RSV plus assay is intended as an aid in the diagnosis of influenza from Nasopharyngeal swab specimens and should not be used as a sole basis for treatment. Nasal washings and aspirates are unacceptable for Xpert Xpress SARS-CoV-2/FLU/RSV testing.  Fact Sheet for Patients: EntrepreneurPulse.com.au  Fact Sheet for Healthcare Providers: IncredibleEmployment.be  This test is not yet approved or cleared by the [place] FDA and has been authorized for detection and/or diagnosis of SARS-CoV-2 by FDA under an Emergency Use Authorization (EUA). This EUA will remain in effect (meaning this test can be used) for the duration of the COVID-19 declaration under Section 564(b)(1) of the Act,  21 U.S.C. section 360bbb-3(b)(1), unless the authorization is terminated or revoked.  Performed at [facility], [place] [address] [place], [place] [address]   Blood culture (routine x 2)     Status: None   Collection Time: 03/09/22  2:04 PM   Specimen: BLOOD  Result Value Ref Range Status   Specimen Description BLOOD BLOOD RIGHT FOREARM  Final   Special Requests   Final    BOTTLES DRAWN AEROBIC AND ANAEROBIC Blood Culture adequate volume   Culture   Final    NO GROWTH 5 DAYS Performed at [facility], [place] [address] [place], [place] [address]    Report Status 03/14/2022 FINAL  Final  Urine Culture     Status: None   Collection Time: 03/09/22  5:35 PM   Specimen: Urine, Catheterized  Result Value Ref Range Status   Specimen Description URINE, CATHETERIZED  Final   Special Requests NONE  Final   Culture   Final    NO GROWTH Performed at [facility], [place] [address], [place], [place] [address]    Report Status 03/11/2022 FINAL  Final  MRSA Next Gen by PCR, Nasal     Status: None   Collection Time: 03/10/22  6:40 AM   Specimen: Urine, Catheterized; Nasal Swab  Result Value Ref Range Status   MRSA by PCR Next Gen NOT DETECTED NOT DETECTED Final    Comment: (NOTE) The GeneXpert MRSA Assay (FDA approved for NASAL specimens only), is one component of a comprehensive MRSA colonization surveillance program. It is not intended to diagnose MRSA infection nor to guide or monitor treatment for MRSA infections. Test performance is not FDA approved in patients less than 67 years old. Performed at [facility], [place] [address] [place], [place] [address]      Radiology Studies: No results found.   Scheduled Meds:  alteplase  2 mg  Intracatheter Once   alteplase  2 mg Intracatheter Once   apixaban  5 mg Oral BID   budesonide (PULMICORT) nebulizer solution  0.5 mg Nebulization BID   Chlorhexidine Gluconate Cloth  6 each Topical Daily   [START ON 03/15/2022]  diltiazem  240 mg Oral Daily   empagliflozin  10 mg Oral Daily   folic acid  1 mg Oral Daily   furosemide  20 mg Oral BID   guaiFENesin  1,200 mg Oral BID   insulin aspart  0-15 Units Subcutaneous Q4H   ipratropium  0.5 mg Nebulization QID   levalbuterol  0.63 mg Nebulization QID   loratadine  10 mg Oral Daily   LORazepam  0-4 mg Intravenous Q8H   [START ON 03/15/2022] methylPREDNISolone (SOLU-MEDROL) injection  120 mg Intravenous Q24H   multivitamin with minerals  1 tablet Oral Daily   nicotine  7 mg Transdermal Daily   mouth rinse  15 mL Mouth Rinse 4 times per day   pantoprazole  40 mg Oral BID AC   sodium chloride flush  10-40 mL Intracatheter Q12H   sodium chloride flush  10-40 mL Intracatheter Q12H   sodium chloride flush  3 mL Intravenous Q12H   thiamine  100 mg Oral Daily   Or   thiamine  100 mg Intravenous Daily   Continuous Infusions:  azithromycin 500 mg (03/14/22 1438)     LOS: 5 days   Flora Lipps, MD [facility] 03/14/2022, 3:34 PM

## 2022-03-14 NOTE — Progress Notes (Signed)
Occupational Therapy Treatment Patient Details Name: Mark [name] MRN: [id number] DOB: 11-Nov-1950 Today's Date: 03/14/2022   History of present illness 71 y/o male admitted 03/09/22 for respiratory distress, chest pain, and diarrhea. Pt with sepsis due to PNA. 9/2 respiratory distress requiring bipap with transfer to ICU and Afib with RVR. PMHx: asthma, ETOH dependence, HTN, COPD   OT comments  Patient remains flat and confused.  Very little eye contact, and responds with one word answers.  Patient able to tolerate generalized HEP seated in the recliner with one sit to stand.  HR up to 143, and slow to normalize.  Patient with noted shortness of breath, with O2 sats acceptable on 4L of O2.  OT to continue efforts in the acute setting, and discharge plan revised to short term rehab at a local SNF prior to returning home.     Recommendations for follow up therapy are one component of a multi-disciplinary discharge planning process, led by the attending physician.  Recommendations may be updated based on patient status, additional functional criteria and insurance authorization.    Follow Up Recommendations  Skilled nursing-short term rehab (<3 hours/day)    Assistance Recommended at Discharge Frequent or constant Supervision/Assistance  Patient can return home with the following  Assistance with cooking/housework;Help with stairs or ramp for entrance;A lot of help with bathing/dressing/bathroom;A lot of help with walking and/or transfers;Assist for transportation;Direct supervision/assist for medications management   Equipment Recommendations  Tub/shower seat    Recommendations for Other Services      Precautions / Restrictions Precautions Precautions: Fall Precaution Comments: watch HR and O2 Restrictions Weight Bearing Restrictions: No       Mobility Bed Mobility               General bed mobility comments: up in the recliner    Transfers   Equipment used: 1 person hand  held assist Transfers: Sit to/from Stand Sit to Stand: Mod assist                 Balance Overall balance assessment: Needs assistance Sitting-balance support: Feet supported Sitting balance-Leahy Scale: Good     Standing balance support: Reliant on assistive device for balance Standing balance-Leahy Scale: Poor                             ADL either performed or assessed with clinical judgement   ADL           Upper Body Bathing: Moderate assistance;Sitting   Lower Body Bathing: Maximal assistance;Sitting/lateral leans   Upper Body Dressing : Moderate assistance;Sitting   Lower Body Dressing: Maximal assistance;Sit to/from stand   Toilet Transfer: Moderate assistance;Stand-pivot;BSC/3in1                  Extremity/Trunk Assessment Upper Extremity Assessment Upper Extremity Assessment: Generalized weakness   Lower Extremity Assessment Lower Extremity Assessment: Defer to PT evaluation   Cervical / Trunk Assessment Cervical / Trunk Assessment: Kyphotic                      Cognition Arousal/Alertness: Awake/alert Behavior During Therapy: Flat affect Overall Cognitive Status: Impaired/Different from baseline                     Current Attention Level: Focused         Problem Solving: Slow processing, Decreased initiation, Difficulty sequencing, Requires verbal cues, Requires tactile cues General Comments: remains flat with  one word answers.                           Pertinent Vitals/ Pain       Pain Assessment Pain Assessment: No/denies pain Pain Intervention(s): Monitored during session                                                          Frequency  Min 2X/week        Progress Toward Goals  OT Goals(current goals can now be found in the care plan section)  Progress towards OT goals: Progressing toward goals  Acute Rehab OT Goals OT Goal Formulation: With  patient Time For Goal Achievement: 03/24/22 Potential to Achieve Goals: Good  Plan Discharge plan needs to be updated    Co-evaluation                 AM-PAC OT "6 Clicks" Daily Activity     Outcome Measure   Help from another person eating meals?: A Little Help from another person taking care of personal grooming?: A Little Help from another person toileting, which includes using toliet, bedpan, or urinal?: A Lot Help from another person bathing (including washing, rinsing, drying)?: A Lot Help from another person to put on and taking off regular upper body clothing?: A Lot Help from another person to put on and taking off regular lower body clothing?: A Lot 6 Click Score: 14    End of Session Equipment Utilized During Treatment: Rolling walker (2 wheels);Oxygen  OT Visit Diagnosis: Unsteadiness on feet (R26.81);Other abnormalities of gait and mobility (R26.89);Muscle weakness (generalized) (M62.81);Other symptoms and signs involving cognitive function   Activity Tolerance Patient limited by fatigue   Patient Left in chair;with call bell/phone within reach;with chair alarm set   Nurse Communication Mobility status        Time: 9371-6967 OT Time Calculation (min): 14 min  Charges: OT General Charges $OT Visit: 1 Visit OT Treatments $Therapeutic Activity: 8-22 mins  03/14/2022  RP, OTR/L  Acute Rehabilitation Services  Office:  [phone]   Suzanna Obey 03/14/2022, 10:36 AM

## 2022-03-14 NOTE — [place] FL2 (Signed)
[place] MEDICAID FL2 LEVEL OF CARE SCREENING TOOL     IDENTIFICATION  Patient Name: Mark [name] Birthdate: Nov 08, 1950 Sex: male Admission Date (Current Location): 03/09/2022  [facility] and [place] Number:  Producer, television/film/video and Address:  [facility] [place], [address], [place], [place] [address]      Provider Number: [id number]  Attending Physician Name and Address:  Joycelyn Das, MD  Relative Name and Phone Number:  Burman Foster) [phone]    Current Level of Care: Hospital Recommended Level of Care: Skilled Nursing Facility Prior Approval Number:    Date Approved/Denied:   PASRR Number: [id number]  Discharge Plan: SNF    Current Diagnoses: Patient Active Problem List   Diagnosis Date Noted   COPD with acute exacerbation (HCC)    Hypervolemia    Atrial fibrillation with RVR (HCC) 03/10/2022   Acute respiratory failure with hypoxia (HCC) 03/09/2022   Sepsis due to pneumonia (HCC) 03/09/2022   Elevated troponin 03/09/2022   EtOH dependence (HCC) 03/09/2022   Tobacco abuse 03/09/2022   PNA (pneumonia) 03/09/2022   ARF (acute renal failure) (HCC) 03/09/2022   Dehydration 03/09/2022   Abnormal TSH 03/09/2022   Chronic alcohol abuse    Severe persistent asthma with acute exacerbation    AKI (acute kidney injury) (HCC)    Severe sepsis (HCC)    Asthma exacerbation 11/10/2019   Tremor 11/10/2019   Alcohol abuse with intoxication (HCC) 11/10/2019   Hypertensive urgency 11/10/2019   Transaminitis 11/10/2019    Orientation RESPIRATION BLADDER Height & Weight     Self, Time, Place  O2 (Nasal Cannula 4 liters) Incontinent, External catheter (External Urinary Catheter) Weight: 170 lb 3.1 oz (77.2 kg) Height:  6\' 2"  (188 cm)  BEHAVIORAL SYMPTOMS/MOOD NEUROLOGICAL BOWEL NUTRITION STATUS      Incontinent Diet (Please see discharge summary)  AMBULATORY STATUS COMMUNICATION OF NEEDS Skin   Limited Assist (Min assist, +2 safety/equipment)  Verbally Other (Comment) (WDL)                       Personal Care Assistance Level of Assistance  Bathing, Feeding, Dressing Bathing Assistance: Maximum assistance Feeding assistance: Limited assistance (Needs assist) Dressing Assistance: Maximum assistance     Functional Limitations Info  Sight, Hearing, Speech Sight Info: Adequate (WDL) Hearing Info: Adequate (WDL) Speech Info: Adequate (WDL)    SPECIAL CARE FACTORS FREQUENCY  PT (By licensed PT), OT (By licensed OT)     PT Frequency: 5x min weekly OT Frequency: 5x min weekly            Contractures Contractures Info: Not present    Additional Factors Info  Code Status, Allergies, Insulin Sliding Scale, Psychotropic Code Status Info: FULL Allergies Info: No Known Allergies Psychotropic Info: LORazepam (ATIVAN) injection 0-4 mg every 8 hours Insulin Sliding Scale Info: insulin aspart (novoLOG) injection 0-15 Units every 4 hours       Current Medications (03/14/2022):  This is the current hospital active medication list Current Facility-Administered Medications  Medication Dose Route Frequency Provider Last Rate Last Admin   acetaminophen (TYLENOL) tablet 650 mg  650 mg Oral Q6H PRN 05/14/2022, MD       Or   acetaminophen (TYLENOL) suppository 650 mg  650 mg Rectal Q6H PRN Rodolph Bong, MD       alteplase (CATHFLO ACTIVASE) injection 2 mg  2 mg Intracatheter Once Rodolph Bong, MD       alteplase (CATHFLO ACTIVASE)  injection 2 mg  2 mg Intracatheter Once Rodolph Bong, MD       apixaban Everlene Balls) tablet 5 mg  5 mg Oral BID Orbie Pyo, MD   5 mg at 03/14/22 0907   azithromycin (ZITHROMAX) 500 mg in sodium chloride 0.9 % 250 mL IVPB  500 mg Intravenous Q24H Rodolph Bong, MD   Stopped at 03/13/22 1630   budesonide (PULMICORT) nebulizer solution 0.5 mg  0.5 mg Nebulization BID Rodolph Bong, MD   0.5 mg at 03/14/22 0913   cefTRIAXone (ROCEPHIN) 2 g in sodium chloride 0.9 % 100  mL IVPB  2 g Intravenous Q24H Rodolph Bong, MD   Stopped at 03/13/22 1501   Chlorhexidine Gluconate Cloth 2 % PADS 6 each  6 each Topical Daily Rodolph Bong, MD   6 each at 03/14/22 1030   [START ON 03/15/2022] diltiazem (CARDIZEM CD) 24 hr capsule 240 mg  240 mg Oral Daily Jake Bathe, MD       empagliflozin (JARDIANCE) tablet 10 mg  10 mg Oral Daily Orbie Pyo, MD   10 mg at 03/14/22 0908   folic acid (FOLVITE) tablet 1 mg  1 mg Oral Daily Mosetta Anis, RPH   1 mg at 03/14/22 0347   furosemide (LASIX) tablet 20 mg  20 mg Oral BID Orbie Pyo, MD   20 mg at 03/14/22 4259   guaiFENesin (MUCINEX) 12 hr tablet 1,200 mg  1,200 mg Oral BID Rodolph Bong, MD   1,200 mg at 03/14/22 0908   hydrALAZINE (APRESOLINE) injection 5 mg  5 mg Intravenous Q6H PRN Rodolph Bong, MD   5 mg at 03/10/22 2253   insulin aspart (novoLOG) injection 0-15 Units  0-15 Units Subcutaneous Q4H Luciano Cutter, MD   5 Units at 03/14/22 1228   ipratropium (ATROVENT) nebulizer solution 0.5 mg  0.5 mg Nebulization Q2H PRN Rodolph Bong, MD   0.5 mg at 03/12/22 1049   ipratropium (ATROVENT) nebulizer solution 0.5 mg  0.5 mg Nebulization QID Rodolph Bong, MD   0.5 mg at 03/14/22 1208   levalbuterol (XOPENEX) nebulizer solution 0.63 mg  0.63 mg Nebulization Q2H PRN Rodolph Bong, MD   0.63 mg at 03/12/22 1049   levalbuterol (XOPENEX) nebulizer solution 0.63 mg  0.63 mg Nebulization QID Rodolph Bong, MD   0.63 mg at 03/14/22 1208   loratadine (CLARITIN) tablet 10 mg  10 mg Oral Daily Rodolph Bong, MD   10 mg at 03/14/22 0908   LORazepam (ATIVAN) injection 0-4 mg  0-4 mg Intravenous Q8H Agarwala, Daleen Bo, MD       meclizine (ANTIVERT) tablet 25 mg  25 mg Oral TID PRN Rodolph Bong, MD       methylPREDNISolone sodium succinate (SOLU-MEDROL) 125 mg/2 mL injection 60 mg  60 mg Intravenous Q8H Rodolph Bong, MD   60 mg at 03/14/22 5638   multivitamin with minerals  tablet 1 tablet  1 tablet Oral Daily Lynnell Catalan, MD   1 tablet at 03/14/22 0908   nicotine (NICODERM CQ - dosed in mg/24 hr) patch 7 mg  7 mg Transdermal Daily Rodolph Bong, MD   7 mg at 03/14/22 0914   ondansetron (ZOFRAN) injection 4 mg  4 mg Intravenous Q6H PRN Rodolph Bong, MD   4 mg at 03/10/22 2050   Oral care mouth rinse  15 mL Mouth Rinse 4 times per day Ramiro Harvest  V, MD   15 mL at 03/14/22 1232   Oral care mouth rinse  15 mL Mouth Rinse PRN Rodolph Bong, MD       oxyCODONE (Oxy IR/ROXICODONE) immediate release tablet 5 mg  5 mg Oral Q4H PRN Rodolph Bong, MD       pantoprazole (PROTONIX) EC tablet 40 mg  40 mg Oral BID AC Rodolph Bong, MD   40 mg at 03/14/22 4401   polyethylene glycol (MIRALAX / GLYCOLAX) packet 17 g  17 g Oral Daily PRN Rodolph Bong, MD       sodium chloride flush (NS) 0.9 % injection 10-40 mL  10-40 mL Intracatheter Q12H Rodolph Bong, MD   10 mL at 03/13/22 2151   sodium chloride flush (NS) 0.9 % injection 10-40 mL  10-40 mL Intracatheter PRN Rodolph Bong, MD       sodium chloride flush (NS) 0.9 % injection 10-40 mL  10-40 mL Intracatheter Q12H Rodolph Bong, MD   30 mL at 03/14/22 0915   sodium chloride flush (NS) 0.9 % injection 10-40 mL  10-40 mL Intracatheter PRN Rodolph Bong, MD       sodium chloride flush (NS) 0.9 % injection 3 mL  3 mL Intravenous Q12H Rodolph Bong, MD   3 mL at 03/14/22 0917   sorbitol 70 % solution 30 mL  30 mL Oral Daily PRN Rodolph Bong, MD       thiamine (VITAMIN B1) tablet 100 mg  100 mg Oral Daily Rodolph Bong, MD   100 mg at 03/14/22 0272   Or   thiamine (VITAMIN B1) injection 100 mg  100 mg Intravenous Daily Rodolph Bong, MD   100 mg at 03/12/22 0931   traZODone (DESYREL) tablet 100 mg  100 mg Oral QHS PRN Rodolph Bong, MD         Discharge Medications: Please see discharge summary for a list of discharge medications.  Relevant Imaging  Results:  Relevant Lab Results:   Additional Information [id number], Both Covid Vaccines 1 booster  Delilah Shan, LCSWA

## 2022-03-15 DIAGNOSIS — N179 Acute kidney failure, unspecified: Secondary | ICD-10-CM | POA: Diagnosis not present

## 2022-03-15 DIAGNOSIS — J9601 Acute respiratory failure with hypoxia: Secondary | ICD-10-CM | POA: Diagnosis not present

## 2022-03-15 DIAGNOSIS — J45901 Unspecified asthma with (acute) exacerbation: Secondary | ICD-10-CM | POA: Diagnosis not present

## 2022-03-15 DIAGNOSIS — R7989 Other specified abnormal findings of blood chemistry: Secondary | ICD-10-CM | POA: Diagnosis not present

## 2022-03-15 LAB — CBC WITH DIFFERENTIAL/PLATELET
Abs Immature Granulocytes: 0.4 10*3/uL — ABNORMAL HIGH (ref 0.00–0.07)
Basophils Absolute: 0 10*3/uL (ref 0.0–0.1)
Basophils Relative: 0 %
Eosinophils Absolute: 0 10*3/uL (ref 0.0–0.5)
Eosinophils Relative: 0 %
HCT: 33.6 % — ABNORMAL LOW (ref 39.0–52.0)
Hemoglobin: 10.8 g/dL — ABNORMAL LOW (ref 13.0–17.0)
Lymphocytes Relative: 4 %
Lymphs Abs: 0.9 10*3/uL (ref 0.7–4.0)
MCH: 33.2 pg (ref 26.0–34.0)
MCHC: 32.1 g/dL (ref 30.0–36.0)
MCV: 103.4 fL — ABNORMAL HIGH (ref 80.0–100.0)
Metamyelocytes Relative: 1 %
Monocytes Absolute: 0.4 10*3/uL (ref 0.1–1.0)
Monocytes Relative: 2 %
Myelocytes: 1 %
Neutro Abs: 20.1 10*3/uL — ABNORMAL HIGH (ref 1.7–7.7)
Neutrophils Relative %: 92 %
Platelets: 456 10*3/uL — ABNORMAL HIGH (ref 150–400)
RBC: 3.25 MIL/uL — ABNORMAL LOW (ref 4.22–5.81)
RDW: 12.8 % (ref 11.5–15.5)
WBC: 21.8 10*3/uL — ABNORMAL HIGH (ref 4.0–10.5)
nRBC: 0.4 % — ABNORMAL HIGH (ref 0.0–0.2)
nRBC: 2 /100 WBC — ABNORMAL HIGH

## 2022-03-15 LAB — GLUCOSE, CAPILLARY
Glucose-Capillary: 111 mg/dL — ABNORMAL HIGH (ref 70–99)
Glucose-Capillary: 132 mg/dL — ABNORMAL HIGH (ref 70–99)
Glucose-Capillary: 134 mg/dL — ABNORMAL HIGH (ref 70–99)
Glucose-Capillary: 138 mg/dL — ABNORMAL HIGH (ref 70–99)
Glucose-Capillary: 140 mg/dL — ABNORMAL HIGH (ref 70–99)
Glucose-Capillary: 147 mg/dL — ABNORMAL HIGH (ref 70–99)
Glucose-Capillary: 169 mg/dL — ABNORMAL HIGH (ref 70–99)
Glucose-Capillary: 216 mg/dL — ABNORMAL HIGH (ref 70–99)

## 2022-03-15 LAB — BASIC METABOLIC PANEL
Anion gap: 9 (ref 5–15)
BUN: 27 mg/dL — ABNORMAL HIGH (ref 8–23)
CO2: 29 mmol/L (ref 22–32)
Calcium: 8.6 mg/dL — ABNORMAL LOW (ref 8.9–10.3)
Chloride: 99 mmol/L (ref 98–111)
Creatinine, Ser: 0.89 mg/dL (ref 0.61–1.24)
GFR, Estimated: >60 mL/min (ref >60)
Glucose, Bld: 148 mg/dL — ABNORMAL HIGH (ref 70–99)
Potassium: 4 mmol/L (ref 3.5–5.1)
Sodium: 137 mmol/L (ref 135–145)

## 2022-03-15 MED ORDER — LEVALBUTEROL HCL 0.63 MG/3ML IN NEBU
0.6300 mg | INHALATION_SOLUTION | Freq: Two times a day (BID) | RESPIRATORY_TRACT | Status: DC
Start: 2022-03-15 — End: 2022-03-19
  Administered 2022-03-15 – 2022-03-18 (×7): 0.63 mg via RESPIRATORY_TRACT
  Filled 2022-03-15 (×8): qty 3

## 2022-03-15 MED ORDER — METOPROLOL SUCCINATE ER 100 MG PO TB24
100.0000 mg | ORAL_TABLET | Freq: Every day | ORAL | Status: DC
  Administered 2022-03-15 – 2022-03-23 (×9): 100 mg via ORAL
  Filled 2022-03-15 (×2): qty 2
  Filled 2022-03-15 (×3): qty 1
  Filled 2022-03-15: qty 2
  Filled 2022-03-15: qty 1
  Filled 2022-03-15: qty 2
  Filled 2022-03-15: qty 1

## 2022-03-15 MED ORDER — IPRATROPIUM BROMIDE 0.02 % IN SOLN
0.5000 mg | Freq: Two times a day (BID) | RESPIRATORY_TRACT | Status: DC
  Administered 2022-03-15 – 2022-03-18 (×7): 0.5 mg via RESPIRATORY_TRACT
  Filled 2022-03-15 (×8): qty 2.5

## 2022-03-15 MED ORDER — LORAZEPAM 1 MG PO TABS: 1.0000 mg | ORAL_TABLET | ORAL | Status: AC | PRN

## 2022-03-15 MED ORDER — LORAZEPAM 2 MG/ML IJ SOLN
1.0000 mg | INTRAMUSCULAR | Status: AC | PRN
  Administered 2022-03-15: 2 mg via INTRAVENOUS
  Filled 2022-03-15: qty 1

## 2022-03-15 MED ORDER — DILTIAZEM HCL ER COATED BEADS 180 MG PO CP24
360.0000 mg | ORAL_CAPSULE | Freq: Every day | ORAL | Status: DC
  Administered 2022-03-15 – 2022-03-23 (×9): 360 mg via ORAL
  Filled 2022-03-15: qty 1
  Filled 2022-03-15 (×2): qty 2
  Filled 2022-03-15: qty 1
  Filled 2022-03-15: qty 2
  Filled 2022-03-15: qty 1
  Filled 2022-03-15: qty 2
  Filled 2022-03-15: qty 1
  Filled 2022-03-15: qty 2

## 2022-03-15 NOTE — Progress Notes (Signed)
Rounding Note    Patient Name: Mark [name] Date of Encounter: 03/15/2022  [facility] Cardiologist: None   Subjective   Still tachy. AFIB. No major compl.  Inpatient Medications    Scheduled Meds:  alteplase  2 mg Intracatheter Once   alteplase  2 mg Intracatheter Once   apixaban  5 mg Oral BID   budesonide (PULMICORT) nebulizer solution  0.5 mg Nebulization BID   Chlorhexidine Gluconate Cloth  6 each Topical Daily   diltiazem  240 mg Oral Daily   empagliflozin  10 mg Oral Daily   folic acid  1 mg Oral Daily   furosemide  20 mg Oral BID   guaiFENesin  1,200 mg Oral BID   insulin aspart  0-15 Units Subcutaneous Q4H   ipratropium  0.5 mg Nebulization QID   levalbuterol  0.63 mg Nebulization QID   loratadine  10 mg Oral Daily   methylPREDNISolone (SOLU-MEDROL) injection  120 mg Intravenous Q24H   multivitamin with minerals  1 tablet Oral Daily   nicotine  7 mg Transdermal Daily   mouth rinse  15 mL Mouth Rinse 4 times per day   pantoprazole  40 mg Oral BID AC   sodium chloride flush  10-40 mL Intracatheter Q12H   sodium chloride flush  10-40 mL Intracatheter Q12H   sodium chloride flush  3 mL Intravenous Q12H   thiamine  100 mg Oral Daily   Or   thiamine  100 mg Intravenous Daily   Continuous Infusions:   PRN Meds: acetaminophen **OR** acetaminophen, hydrALAZINE, ipratropium, levalbuterol, meclizine, [DISCONTINUED] ondansetron **OR** ondansetron (ZOFRAN) IV, mouth rinse, oxyCODONE, polyethylene glycol, sodium chloride flush, sodium chloride flush, sorbitol, traZODone   Vital Signs    Vitals:   03/15/22 0420 03/15/22 0500 03/15/22 0600 03/15/22 0815  BP: 138/79 (!) 136/105 111/83   Pulse: (!) 105 (!) 109 (!) 115   Resp: (!) 22 (!) 25 (!) 21   Temp:    (!) 97.4 F (36.3 C)  TempSrc:    Oral  SpO2: 100% 99% 99%   Weight:      Height:        Intake/Output Summary (Last 24 hours) at 03/15/2022 0844 Last data filed at 03/15/2022 0400 Gross per 24  hour  Intake 250.01 ml  Output 1550 ml  Net -1299.99 ml      03/14/2022    5:00 AM 03/13/2022    6:00 AM 03/12/2022    5:00 AM  Last 3 Weights  Weight (lbs) 170 lb 3.1 oz 172 lb 13.5 oz 176 lb 12.9 oz  Weight (kg) 77.2 kg 78.4 kg 80.2 kg      Telemetry    Atrial fibrillation heart rate currently 100-130- Personally Reviewed  ECG    A-fib with RVR- Personally Reviewed  Physical Exam   Irreg tachy  Labs    High Sensitivity Troponin:   Recent Labs  Lab 03/09/22 1400 03/09/22 1653 03/09/22 1927 03/09/22 2250  TROPONINIHS 181* 150* 143* 118*     Chemistry Recent Labs  Lab 03/10/22 0258 03/10/22 1757 03/11/22 0430 03/12/22 0446 03/13/22 0440 03/14/22 0402 03/15/22 0341  NA 138 136 138 138 138 142 137  K 3.5 4.0 3.4* 4.3 4.3 4.3 4.0  CL 102 104 101 103 100 104 99  CO2 25 22 27 29 30 30 29   GLUCOSE 237* 285* 230* 161* 179* 124* 148*  BUN 39* 37* 34* 34* 30* 26* 27*  CREATININE 1.35* 1.15 1.12 0.94 0.97 0.82 0.89  CALCIUM 8.6* 8.5* 8.5* 8.7* 8.9 9.0 8.6*  MG 3.2* 3.1*  --  3.2*  --   --   --   PROT 6.8  --  6.8 6.9  --   --   --   ALBUMIN 1.9*  --  1.8* 1.8*  --   --   --   AST 125*  --  100* 75*  --   --   --   ALT 54*  --  66* 67*  --   --   --   ALKPHOS 70  --  81 91  --   --   --   BILITOT 0.6  --  0.4 0.4  --   --   --   GFRNONAA 57* >60 >60 >60 >60 >60 >60  ANIONGAP 11 10 10 6 8 8 9     Lipids No results for input(s): "CHOL", "TRIG", "HDL", "LABVLDL", "LDLCALC", "CHOLHDL" in the last 168 hours.  Hematology Recent Labs  Lab 03/13/22 0440 03/14/22 0402 03/15/22 0341  WBC 19.2* 20.5* 21.8*  RBC 3.29* 3.26* 3.25*  HGB 11.0* 10.8* 10.8*  HCT 34.2* 33.5* 33.6*  MCV 104.0* 102.8* 103.4*  MCH 33.4 33.1 33.2  MCHC 32.2 32.2 32.1  RDW 12.7 12.7 12.8  PLT 373 408* 456*   Thyroid  Recent Labs  Lab 03/09/22 1402 03/10/22 0258  TSH 0.195*  --   FREET4  --  1.39*    BNP Recent Labs  Lab 03/09/22 1400 03/11/22 0500 03/12/22 0446  BNP 1,309.2*  586.3* 642.5*    DDimer No results for input(s): "DDIMER" in the last 168 hours.   Radiology    No results found.  Cardiac Studies   ECHO 03/11/22    1. Abnormal septal motion . Left ventricular ejection fraction, by  estimation, is 50 to 55%. The left ventricle has low normal function. The  left ventricle has no regional wall motion abnormalities. Left ventricular  diastolic parameters are  indeterminate.   2. Right ventricular systolic function is moderately reduced. The right  ventricular size is moderately enlarged.   3. The mitral valve is abnormal. Trivial mitral valve regurgitation. No  evidence of mitral stenosis.   4. The aortic valve is tricuspid. There is mild calcification of the  aortic valve. There is mild thickening of the aortic valve. Aortic valve  regurgitation is not visualized. Aortic valve sclerosis is present, with  no evidence of aortic valve stenosis.   5. The inferior vena cava is dilated in size with >50% respiratory  variability, suggesting right atrial pressure of 8 mmHg.   Patient Profile     71 y.o. male with A-fib RVR, sepsis, alcohol use, smoker  Assessment & Plan    A-fib with RVR Acute diastolic heart failure - diltiazem 120 CD started 9/4, I will increase again to 360 from 240 yesterday -Prior heart rates were as high as 182 on 03/10/2022 driven by pneumonia, sepsis.  EF 55%, elevated free T4.  Trying to avoid amiodarone.   -- Now on Eliquis 5 mg twice a day for anticoagulation. -Lasix low-dose 20 mg twice daily to keep even.  On Jardiance 10 mg as well.  Elevated troponin secondary to type II MI with underlying pulmonary disease - Likely secondary to sepsis, COPD, atrial fibrillation with rapid ventricular response -Continue with supportive care.  Does not require cardiac rehab etc.  Alcohol use Delirium - Has fallen in the past.  May not be a good long-term anticoagulation candidate. For now  continue Eliquis.     For questions or  updates, please contact [facility] Please consult [email or web address] for contact info under        Signed, Donato Schultz, MD  03/15/2022, 8:44 AM

## 2022-03-15 NOTE — TOC Progression Note (Signed)
Transition of Care [facility]) - Progression Note    Patient Details  Name: Mark [name] MRN: [id number] Date of Birth: February 21, 1951  [facility] of [facility]) CM/SW Contact  Delilah Shan, LCSWA Phone Number: 03/15/2022, 3:04 PM  Clinical Narrative:     CSW spoke with patient at bedside and provided medicare compare SNF ratings list with accepted bed offers. Patient would like to review bed offers and discuss with his son Mark [name]. Patient gave CSW permission to call [facility] and go over bed offers with him. CSW called [name] and provided SNF bed offers for patient. Mark [name] is going to review and call patient to discuss. CSW will follow up with patient tomorrow on SNF choice. CSW following to start auth close to patient being medically ready for dc. CSW will continue to follow and assist with patients dc planning needs.  Expected Discharge Plan: Skilled Nursing Facility Barriers to Discharge: Continued Medical Work up  Expected Discharge Plan and Services Expected Discharge Plan: Skilled Nursing Facility In-house Referral: Clinical Social Work Discharge Planning Services: CM Consult Post Acute Care Choice: Home Health, Skilled Nursing Facility Living arrangements for the past 2 months: Single Family Home                                       Social Determinants of Health (SDOH) Interventions    Readmission Risk Interventions     No data to display

## 2022-03-15 NOTE — Progress Notes (Signed)
   Heart rate still remains between 130 and 150 with atrial fibrillation despite increasing diltiazem to 360 mg daily.  I will go ahead and add Toprol-XL 100 mg to his regimen as well.  Blood pressure stable.  Donato Schultz, MD

## 2022-03-15 NOTE — Progress Notes (Signed)
PROGRESS NOTE    Mark [name]  [id number] DOB: [birth year] DOA: 03/09/2022 PCP: Cipriano Mile, NP    Brief Narrative:  Patient 71 year old male with past medical history of asthma, alcohol dependence, hypertension, BPH, allergic rhinitis presented to the ED with 4-day history of productive cough, brownish sputum, acute respiratory distress requiring BiPAP.  Patient noted on work-up to be septic with concern for right upper lobe pneumonia, and also had rapid atrial fibrillation with RVR on 03/10/2022.  Patient placed on Cardizem drip and subsequently transition to oral Cardizem.  PCCM and cardiology following.  Assessment & Plan:   Principal Problem:   Acute respiratory failure with hypoxia (HCC) Active Problems:   Sepsis due to pneumonia ([place])   Asthma exacerbation   Transaminitis   Elevated troponin   EtOH dependence (HCC)   Tobacco abuse   PNA (pneumonia)   ARF (acute renal failure) (HCC)   Dehydration   Abnormal TSH   Chronic alcohol abuse   Severe persistent asthma with acute exacerbation   AKI (acute kidney injury) ([place])   Severe sepsis (HCC)   Atrial fibrillation with RVR (HCC)   Hypervolemia   COPD with acute exacerbation (HCC)  Acute respiratory failure with hypoxia . -Likely multifactorial secondary to right upper lobe pneumonia in the setting of an acute asthma /COPD exacerbation and concern for volume overload/CHF exacerbation.  Initially had received a CPAP, Solu-Medrol, BiPAP.  Currently on 3 L of nasal cannula oxygen.  Received BiPAP in the morning, has completed course of antibiotic for pneumonia.  Currently on oral Lasix twice daily.  Was initially in the ICU for respiratory failure.  Continue DuoNebs and supportive care.  Sepsis secondary to pneumonia,  Negative blood cultures so far in 5 days.  Respiratory viral panel was negative.  Lactate was significantly elevated on presentation which has improved at this time.  MRSA PCR and urinary Legionella. urine  pneumococcus antigen negative.  Has completed course of antibiotic with Rocephin and Zithromax.  Still symptomatic with cough with production.  New onset atrial fibrillation Likely multifactorial secondary to sepsis, pulmonary issues and abnormal TSH. 2D echo with EF of 50 to 55%. TSH abnormal.  Thyroid-stimulating immunoglobulin less than 0.10.  Elevated free T4.  Due to abnormal TSH amiodarone was not started.  Currently on Cardizem long-acting as per cardiology.  Was on IV heparin which has been transitioned to Eliquis.  Cardiology following and have increased dose of Cardizem to 360 from 240 mg.  We will continue to monitor.  Volume overload/acute diastolic CHF exacerbation Received IV fluids with sepsis diagnosis.  Received Lasix with improved status.  2 D echocardiogram with preserved LV function.  Continue oral Lasix twice daily.  Continue Jardiance.  Elevated troponin -Likely demand ischemia.  Cardiology on board.  2D echocardiogram with LV ejection fraction of 50 to 55% with no regional wall motion abnormalities.  Continue aspirin.  Essential hypertension Cardiology on board was on IV Cardizem and has been changed to oral.  Continue Lasix.  Dose of Cardizem has been increased.   History of alcohol use Continue as needed Ativan CIWA protocol.  Continue thiamine multivitamin folic acid.  Signs of active withdrawal  Tobacco dependence -Continue nicotine patch.  Elevated LFT. Latest AST at 75, ALT 67.  History of alcohol use.  Check LFTs in AM.   Acute renal failure Improved.  Likely secondary to prerenal azotemia in the setting of HCTZ, ARB.  UA was negative.  Had received Lasix during hospitalization.  Has been transitioned to  oral Lasix.  Renal function improving.  Latest creatinine at 0.8.   Abnormal TSH -TSH noted at 0.195.  Free T4 of 1.39.  Free T3 at 1.6. Total T3 at 79. Thyroid-stimulating immunoglobin less than 0.10.  Will need outpatient PCP follow-up for  this.  Anemia/folate deficiency Hemoglobin stable at 10.8 from 11.0.  FOBT pending.  Anemia panel consistent with anemia of chronic disease.  Folate level at 4.7 continue folic acid.  Transfuse for hemoglobin less than 7.  DVT prophylaxis: Eliquis  Code Status: Full code  Family Communication:   No family at bedside.  Disposition: SNF  Status is: Inpatient Remains inpatient appropriate because: Severity of illness, pending placement.   Consultants:  Cardiology: Dr. Cristal Deer 03/09/2022 PCCM: Dr. Marchelle Gearing 03/09/2022  Procedures:   PICC line 03/10/2022  Antimicrobials:  IV Rocephin 03/09/2022>>>>> IV azithromycin 03/09/2022   Subjective: Today, patient was seen and examined at bedside.  Denies any nausea vomiting fever chills rigors.  Complains of cough with productive sputum.  Still tachycardic.  Objective: Vitals:   03/15/22 0815 03/15/22 0830 03/15/22 0845 03/15/22 0900  BP:      Pulse: 69 66 65   Resp: (!) 29 (!) 22 (!) 32 (!) 21  Temp: (!) 97.4 F (36.3 C)     TempSrc: Oral     SpO2: 100% 100% 100%   Weight:      Height:        Intake/Output Summary (Last 24 hours) at 03/15/2022 1059 Last data filed at 03/15/2022 0400 Gross per 24 hour  Intake 250.01 ml  Output 1550 ml  Net -1299.99 ml    Filed Weights   03/12/22 0500 03/13/22 0600 03/14/22 0500  Weight: 80.2 kg 78.4 kg 77.2 kg    Physical examination: General:  Average built, not in obvious distress, on 3 L of oxygen by nasal cannula HENT:   No scleral pallor or icterus noted. Oral mucosa is moist.  Chest:    Diminished breath sounds bilaterally.  Coarse breath sounds noted. CVS: S1 &S2 heard. No murmur.  Regular rate and rhythm. Abdomen: Soft, nontender, nondistended.  Bowel sounds are heard.   Extremities: No cyanosis, clubbing with trace edema..  Peripheral pulses are palpable.  Right upper extremity PICC line in place  psych: Alert, awake and oriented, normal mood CNS:  No cranial nerve deficits.   Power equal in all extremities.   Skin: Warm and dry.  No rashes noted.   Data Reviewed: I have personally reviewed the following labs and imaging studies.    CBC: Recent Labs  Lab 03/11/22 0430 03/12/22 0446 03/13/22 0440 03/14/22 0402 03/15/22 0341  WBC 19.8* 21.1* 19.2* 20.5* 21.8*  NEUTROABS 18.6* 18.8* 17.1* 17.7* 20.1*  HGB 10.5* 10.3* 11.0* 10.8* 10.8*  HCT 32.4* 31.6* 34.2* 33.5* 33.6*  MCV 102.9* 102.9* 104.0* 102.8* 103.4*  PLT 303 336 373 408* 456*    Basic Metabolic Panel: Recent Labs  Lab 03/09/22 1400 03/09/22 1410 03/10/22 0258 03/10/22 1757 03/11/22 0430 03/12/22 0446 03/13/22 0440 03/14/22 0402 03/15/22 0341  NA 136   < > 138 136 138 138 138 142 137  K 3.6   < > 3.5 4.0 3.4* 4.3 4.3 4.3 4.0  CL 94*   < > 102 104 101 103 100 104 99  CO2 18*   < > 25 22 27 29 30 30 29   GLUCOSE 159*   < > 237* 285* 230* 161* 179* 124* 148*  BUN 46*   < > 39* 37* 34*  34* 30* 26* 27*  CREATININE 2.86*   < > 1.35* 1.15 1.12 0.94 0.97 0.82 0.89  CALCIUM 9.3   < > 8.6* 8.5* 8.5* 8.7* 8.9 9.0 8.6*  MG 3.4*  --  3.2* 3.1*  --  3.2*  --   --   --   PHOS  --   --  4.3 3.1  --   --   --   --   --    < > = values in this interval not displayed.     GFR: Estimated Creatinine Clearance: 85.5 mL/min (by C-G formula based on SCr of 0.89 mg/dL).  Liver Function Tests: Recent Labs  Lab 03/09/22 1400 03/10/22 0258 03/11/22 0430 03/12/22 0446  AST 70* 125* 100* 75*  ALT 42 54* 66* 67*  ALKPHOS 96 70 81 91  BILITOT 1.0 0.6 0.4 0.4  PROT 8.5* 6.8 6.8 6.9  ALBUMIN 2.4* 1.9* 1.8* 1.8*     CBG: Recent Labs  Lab 03/14/22 2123 03/14/22 2340 03/15/22 0337 03/15/22 0623 03/15/22 0813  GLUCAP 153* 111* 138* 140* 169*      Recent Results (from the past 240 hour(s))  Blood culture (routine x 2)     Status: None   Collection Time: 03/09/22  2:00 PM   Specimen: BLOOD  Result Value Ref Range Status   Specimen Description BLOOD SITE NOT SPECIFIED  Final   Special  Requests   Final    BOTTLES DRAWN AEROBIC AND ANAEROBIC Blood Culture adequate volume   Culture   Final    NO GROWTH 5 DAYS Performed at [facility], [place] [address] [place], [place] [address]    Report Status 03/14/2022 FINAL  Final  Respiratory (~20 pathogens) panel by PCR     Status: None   Collection Time: 03/09/22  2:00 PM   Specimen: Nasopharyngeal Swab; Respiratory  Result Value Ref Range Status   Adenovirus NOT DETECTED NOT DETECTED Final   Coronavirus 229E NOT DETECTED NOT DETECTED Final    Comment: (NOTE) The Coronavirus on the Respiratory Panel, DOES NOT test for the novel  Coronavirus (2019 nCoV)    Coronavirus HKU1 NOT DETECTED NOT DETECTED Final   Coronavirus NL63 NOT DETECTED NOT DETECTED Final   Coronavirus OC43 NOT DETECTED NOT DETECTED Final   Metapneumovirus NOT DETECTED NOT DETECTED Final   Rhinovirus / Enterovirus NOT DETECTED NOT DETECTED Final   Influenza A NOT DETECTED NOT DETECTED Final   Influenza B NOT DETECTED NOT DETECTED Final   Parainfluenza Virus 1 NOT DETECTED NOT DETECTED Final   Parainfluenza Virus 2 NOT DETECTED NOT DETECTED Final   Parainfluenza Virus 3 NOT DETECTED NOT DETECTED Final   Parainfluenza Virus 4 NOT DETECTED NOT DETECTED Final   Respiratory Syncytial Virus NOT DETECTED NOT DETECTED Final   Bordetella pertussis NOT DETECTED NOT DETECTED Final   Bordetella Parapertussis NOT DETECTED NOT DETECTED Final   Chlamydophila pneumoniae NOT DETECTED NOT DETECTED Final   Mycoplasma pneumoniae NOT DETECTED NOT DETECTED Final    Comment: Performed at [facility], [place], [place], [place] [address]  Resp Panel by RT-PCR (Flu A&B, Covid) Anterior Nasal Swab     Status: None   Collection Time: 03/09/22  2:01 PM   Specimen: Anterior Nasal Swab  Result Value Ref Range Status   SARS Coronavirus 2 by RT PCR NEGATIVE NEGATIVE Final    Comment: (NOTE) SARS-CoV-2 target nucleic acids are NOT DETECTED.  The SARS-CoV-2 RNA is  generally detectable in  upper respiratory specimens during the acute phase of infection. The lowest concentration of SARS-CoV-2 viral copies this assay can detect is 138 copies/mL. A negative result does not preclude SARS-Cov-2 infection and should not be used as the sole basis for treatment or other patient management decisions. A negative result may occur with  improper specimen collection/handling, submission of specimen other than nasopharyngeal swab, presence of viral mutation(s) within the areas targeted by this assay, and inadequate number of viral copies(<138 copies/mL). A negative result must be combined with clinical observations, patient history, and epidemiological information. The expected result is Negative.  Fact Sheet for Patients:  BloggerCourse.com  Fact Sheet for Healthcare Providers:  SeriousBroker.it  This test is no t yet approved or cleared by the [place] FDA and  has been authorized for detection and/or diagnosis of SARS-CoV-2 by FDA under an Emergency Use Authorization (EUA). This EUA will remain  in effect (meaning this test can be used) for the duration of the COVID-19 declaration under Section 564(b)(1) of the Act, 21 U.S.C.section 360bbb-3(b)(1), unless the authorization is terminated  or revoked sooner.       Influenza A by PCR NEGATIVE NEGATIVE Final   Influenza B by PCR NEGATIVE NEGATIVE Final    Comment: (NOTE) The Xpert Xpress SARS-CoV-2/FLU/RSV plus assay is intended as an aid in the diagnosis of influenza from Nasopharyngeal swab specimens and should not be used as a sole basis for treatment. Nasal washings and aspirates are unacceptable for Xpert Xpress SARS-CoV-2/FLU/RSV testing.  Fact Sheet for Patients: BloggerCourse.com  Fact Sheet for Healthcare Providers: SeriousBroker.it  This test is not yet approved or cleared by the [place] FDA and has been authorized for detection and/or diagnosis of SARS-CoV-2 by FDA under an Emergency Use Authorization (EUA). This EUA will remain in effect (meaning this test can be used) for the duration of the COVID-19 declaration under Section 564(b)(1) of the Act, 21 U.S.C. section 360bbb-3(b)(1), unless the authorization is terminated or revoked.  Performed at [facility], [address] [place], [place] [address]   Blood culture (routine x 2)     Status: None   Collection Time: 03/09/22  2:04 PM   Specimen: BLOOD  Result Value Ref Range Status   Specimen Description BLOOD BLOOD RIGHT FOREARM  Final   Special Requests   Final    BOTTLES DRAWN AEROBIC AND ANAEROBIC Blood Culture adequate volume   Culture   Final    NO GROWTH 5 DAYS Performed at [facility], [address], [place], [place] [address]    Report Status 03/14/2022 FINAL  Final  Urine Culture     Status: None   Collection Time: 03/09/22  5:35 PM   Specimen: Urine, Catheterized  Result Value Ref Range Status   Specimen Description URINE, CATHETERIZED  Final   Special Requests NONE  Final   Culture   Final    NO GROWTH Performed at [facility], [address] [place], [place] [address]    Report Status 03/11/2022 FINAL  Final  MRSA Next Gen by PCR, Nasal     Status: None   Collection Time: 03/10/22  6:40 AM   Specimen: Urine, Catheterized; Nasal Swab  Result Value Ref Range Status   MRSA by PCR Next Gen NOT DETECTED NOT DETECTED Final    Comment: (NOTE) The GeneXpert MRSA Assay (FDA approved for NASAL specimens only), is one component of a comprehensive MRSA colonization surveillance program. It is not intended to  diagnose MRSA infection nor to guide or monitor treatment for MRSA infections. Test performance is not FDA approved in patients less than 61 years old. Performed at [facility], [place] [address], [place],  [address]      Radiology Studies: No results  found.   Scheduled Meds:  alteplase  2 mg Intracatheter Once   apixaban  5 mg Oral BID   budesonide (PULMICORT) nebulizer solution  0.5 mg Nebulization BID   Chlorhexidine Gluconate Cloth  6 each Topical Daily   diltiazem  360 mg Oral Daily   empagliflozin  10 mg Oral Daily   folic acid  1 mg Oral Daily   furosemide  20 mg Oral BID   guaiFENesin  1,200 mg Oral BID   insulin aspart  0-15 Units Subcutaneous Q4H   ipratropium  0.5 mg Nebulization QID   levalbuterol  0.63 mg Nebulization QID   loratadine  10 mg Oral Daily   methylPREDNISolone (SOLU-MEDROL) injection  120 mg Intravenous Q24H   multivitamin with minerals  1 tablet Oral Daily   nicotine  7 mg Transdermal Daily   mouth rinse  15 mL Mouth Rinse 4 times per day   pantoprazole  40 mg Oral BID AC   sodium chloride flush  10-40 mL Intracatheter Q12H   sodium chloride flush  10-40 mL Intracatheter Q12H   sodium chloride flush  3 mL Intravenous Q12H   thiamine  100 mg Oral Daily   Or   thiamine  100 mg Intravenous Daily   Continuous Infusions:    LOS: 6 days   Flora Lipps, MD [facility] 03/15/2022, 10:59 AM

## 2022-03-16 DIAGNOSIS — J9601 Acute respiratory failure with hypoxia: Secondary | ICD-10-CM | POA: Diagnosis not present

## 2022-03-16 DIAGNOSIS — N179 Acute kidney failure, unspecified: Secondary | ICD-10-CM | POA: Diagnosis not present

## 2022-03-16 DIAGNOSIS — R7989 Other specified abnormal findings of blood chemistry: Secondary | ICD-10-CM | POA: Diagnosis not present

## 2022-03-16 DIAGNOSIS — J45901 Unspecified asthma with (acute) exacerbation: Secondary | ICD-10-CM | POA: Diagnosis not present

## 2022-03-16 LAB — GLUCOSE, CAPILLARY
Glucose-Capillary: 122 mg/dL — ABNORMAL HIGH (ref 70–99)
Glucose-Capillary: 126 mg/dL — ABNORMAL HIGH (ref 70–99)
Glucose-Capillary: 129 mg/dL — ABNORMAL HIGH (ref 70–99)
Glucose-Capillary: 131 mg/dL — ABNORMAL HIGH (ref 70–99)
Glucose-Capillary: 139 mg/dL — ABNORMAL HIGH (ref 70–99)
Glucose-Capillary: 143 mg/dL — ABNORMAL HIGH (ref 70–99)
Glucose-Capillary: 145 mg/dL — ABNORMAL HIGH (ref 70–99)
Glucose-Capillary: 148 mg/dL — ABNORMAL HIGH (ref 70–99)

## 2022-03-16 LAB — CBC WITH DIFFERENTIAL/PLATELET
Abs Immature Granulocytes: 1.1 10*3/uL — ABNORMAL HIGH (ref 0.00–0.07)
Basophils Absolute: 0.5 10*3/uL — ABNORMAL HIGH (ref 0.0–0.1)
Basophils Relative: 2 %
Eosinophils Absolute: 0 10*3/uL (ref 0.0–0.5)
Eosinophils Relative: 0 %
HCT: 36 % — ABNORMAL LOW (ref 39.0–52.0)
Hemoglobin: 11.8 g/dL — ABNORMAL LOW (ref 13.0–17.0)
Lymphocytes Relative: 1 %
Lymphs Abs: 0.2 10*3/uL — ABNORMAL LOW (ref 0.7–4.0)
MCH: 33.5 pg (ref 26.0–34.0)
MCHC: 32.8 g/dL (ref 30.0–36.0)
MCV: 102.3 fL — ABNORMAL HIGH (ref 80.0–100.0)
Metamyelocytes Relative: 1 %
Monocytes Absolute: 1.1 10*3/uL — ABNORMAL HIGH (ref 0.1–1.0)
Monocytes Relative: 5 %
Myelocytes: 4 %
Neutro Abs: 19.7 10*3/uL — ABNORMAL HIGH (ref 1.7–7.7)
Neutrophils Relative %: 87 %
Platelets: 488 10*3/uL — ABNORMAL HIGH (ref 150–400)
RBC: 3.52 MIL/uL — ABNORMAL LOW (ref 4.22–5.81)
RDW: 12.7 % (ref 11.5–15.5)
WBC: 22.7 10*3/uL — ABNORMAL HIGH (ref 4.0–10.5)
nRBC: 0.3 % — ABNORMAL HIGH (ref 0.0–0.2)
nRBC: 1 /100 WBC — ABNORMAL HIGH

## 2022-03-16 MED ORDER — ORAL CARE MOUTH RINSE: 15.0000 mL | OROMUCOSAL | Status: DC | PRN

## 2022-03-16 NOTE — Progress Notes (Signed)
Rounding Note    Patient Name: Mark [name] Date of Encounter: 03/16/2022  [facility] Cardiologist: None   Subjective   Atrial fibrillation converted to sinus rhythm yesterday with the addition of Toprol-XL 100 mg to his diltiazem 360 mg  Feels comfortable, no significant symptoms.   Inpatient Medications    Scheduled Meds:  alteplase  2 mg Intracatheter Once   apixaban  5 mg Oral BID   budesonide (PULMICORT) nebulizer solution  0.5 mg Nebulization BID   Chlorhexidine Gluconate Cloth  6 each Topical Daily   diltiazem  360 mg Oral Daily   empagliflozin  10 mg Oral Daily   folic acid  1 mg Oral Daily   furosemide  20 mg Oral BID   guaiFENesin  1,200 mg Oral BID   insulin aspart  0-15 Units Subcutaneous Q4H   ipratropium  0.5 mg Nebulization BID   levalbuterol  0.63 mg Nebulization BID   loratadine  10 mg Oral Daily   methylPREDNISolone (SOLU-MEDROL) injection  120 mg Intravenous Q24H   metoprolol succinate  100 mg Oral Daily   multivitamin with minerals  1 tablet Oral Daily   nicotine  7 mg Transdermal Daily   mouth rinse  15 mL Mouth Rinse 4 times per day   pantoprazole  40 mg Oral BID AC   sodium chloride flush  10-40 mL Intracatheter Q12H   sodium chloride flush  10-40 mL Intracatheter Q12H   sodium chloride flush  3 mL Intravenous Q12H   thiamine  100 mg Oral Daily   Or   thiamine  100 mg Intravenous Daily   Continuous Infusions:   PRN Meds: acetaminophen **OR** acetaminophen, hydrALAZINE, ipratropium, levalbuterol, LORazepam **OR** LORazepam, meclizine, [DISCONTINUED] ondansetron **OR** ondansetron (ZOFRAN) IV, mouth rinse, oxyCODONE, polyethylene glycol, sodium chloride flush, sodium chloride flush, sorbitol, traZODone   Vital Signs    Vitals:   03/16/22 0700 03/16/22 0719 03/16/22 0721 03/16/22 0807  BP: 138/80     Pulse: 86     Resp: (!) 28     Temp:    (!) 97.2 F (36.2 C)  TempSrc:    Axillary  SpO2: 94% 95% 95%   Weight:       Height:        Intake/Output Summary (Last 24 hours) at 03/16/2022 0823 Last data filed at 03/15/2022 1600 Gross per 24 hour  Intake 720 ml  Output 900 ml  Net -180 ml      03/16/2022    5:00 AM 03/14/2022    5:00 AM 03/13/2022    6:00 AM  Last 3 Weights  Weight (lbs) 167 lb 1.7 oz 170 lb 3.1 oz 172 lb 13.5 oz  Weight (kg) 75.8 kg 77.2 kg 78.4 kg      Telemetry    Sinus rhythm currently in the 70s.  Occasional PVCs.- Personally Reviewed  ECG    A-fib with RVR previously- Personally Reviewed  Physical Exam   Comfortable, regular rate and rhythm   Labs    High Sensitivity Troponin:   Recent Labs  Lab 03/09/22 1400 03/09/22 1653 03/09/22 1927 03/09/22 2250  TROPONINIHS 181* 150* 143* 118*     Chemistry Recent Labs  Lab 03/10/22 0258 03/10/22 1757 03/11/22 0430 03/12/22 0446 03/13/22 0440 03/14/22 0402 03/15/22 0341  NA 138 136 138 138 138 142 137  K 3.5 4.0 3.4* 4.3 4.3 4.3 4.0  CL 102 104 101 103 100 104 99  CO2 25 22 27 29 30 30  29  GLUCOSE 237* 285* 230* 161* 179* 124* 148*  BUN 39* 37* 34* 34* 30* 26* 27*  CREATININE 1.35* 1.15 1.12 0.94 0.97 0.82 0.89  CALCIUM 8.6* 8.5* 8.5* 8.7* 8.9 9.0 8.6*  MG 3.2* 3.1*  --  3.2*  --   --   --   PROT 6.8  --  6.8 6.9  --   --   --   ALBUMIN 1.9*  --  1.8* 1.8*  --   --   --   AST 125*  --  100* 75*  --   --   --   ALT 54*  --  66* 67*  --   --   --   ALKPHOS 70  --  81 91  --   --   --   BILITOT 0.6  --  0.4 0.4  --   --   --   GFRNONAA 57* >60 >60 >60 >60 >60 >60  ANIONGAP 11 10 10 6 8 8 9     Lipids No results for input(s): "CHOL", "TRIG", "HDL", "LABVLDL", "LDLCALC", "CHOLHDL" in the last 168 hours.  Hematology Recent Labs  Lab 03/14/22 0402 03/15/22 0341 03/16/22 0401  WBC 20.5* 21.8* 22.7*  RBC 3.26* 3.25* 3.52*  HGB 10.8* 10.8* 11.8*  HCT 33.5* 33.6* 36.0*  MCV 102.8* 103.4* 102.3*  MCH 33.1 33.2 33.5  MCHC 32.2 32.1 32.8  RDW 12.7 12.8 12.7  PLT 408* 456* 488*   Thyroid  Recent Labs  Lab  03/09/22 1402 03/10/22 0258  TSH 0.195*  --   FREET4  --  1.39*    BNP Recent Labs  Lab 03/09/22 1400 03/11/22 0500 03/12/22 0446  BNP 1,309.2* 586.3* 642.5*    DDimer No results for input(s): "DDIMER" in the last 168 hours.   Radiology    No results found.  Cardiac Studies   ECHO 03/11/22    1. Abnormal septal motion . Left ventricular ejection fraction, by  estimation, is 50 to 55%. The left ventricle has low normal function. The  left ventricle has no regional wall motion abnormalities. Left ventricular  diastolic parameters are  indeterminate.   2. Right ventricular systolic function is moderately reduced. The right  ventricular size is moderately enlarged.   3. The mitral valve is abnormal. Trivial mitral valve regurgitation. No  evidence of mitral stenosis.   4. The aortic valve is tricuspid. There is mild calcification of the  aortic valve. There is mild thickening of the aortic valve. Aortic valve  regurgitation is not visualized. Aortic valve sclerosis is present, with  no evidence of aortic valve stenosis.   5. The inferior vena cava is dilated in size with >50% respiratory  variability, suggesting right atrial pressure of 8 mmHg.   Patient Profile     71 y.o. male with A-fib RVR, sepsis, alcohol use, smoker  Assessment & Plan    A-fib with RVR-converted [id number] Acute diastolic heart failure - diltiazem 360.  Toprol XL 100.  Continue -Prior heart rates were as high as 182 on 03/10/2022 driven by pneumonia, sepsis.  EF 55%, elevated free T4.   -- Now on Eliquis 5 mg twice a day for anticoagulation. -Lasix low-dose 20 mg twice daily to keep even.  On Jardiance 10 mg as well.  Elevated troponin secondary to type II MI with underlying pulmonary disease - Likely secondary to sepsis, COPD, atrial fibrillation with rapid ventricular response -Continue with supportive care.  Does not require cardiac rehab etc.  Alcohol use Delirium -  Has fallen in the past.   May not be a good long-term anticoagulation candidate. For now continue Eliquis.  Stable without any signs of bleeding.    For questions or updates, please contact [facility] Please consult [email or web address] for contact info under        Signed, Donato Schultz, MD  03/16/2022, 8:23 AM

## 2022-03-16 NOTE — TOC Progression Note (Addendum)
Transition of Care [facility]) - Progression Note    Patient Details  Name: Mark [name] MRN: [id number] Date of Birth: [birth year]  [facility]) CM/SW Contact  Delilah Shan, LCSWA Phone Number: 03/16/2022, 12:17 PM  Clinical Narrative:     Update -12:30pm-CSW received callback from patients son Mark [name] who chose SNF placement for patient at [facility]. CSW spoke with Olegario Messier with [facility] who confirmed SNF bed for patient. CSW acknowledges substance consult for patient. CSW following to complete consult when appropriate. CSW following to start insurance authorization close to patient being medically ready for dc.  Due to patients current orientation CSW called and LVM for patients son Mark [name] to discuss SNF choice for patient.   Expected Discharge Plan: Skilled Nursing Facility Barriers to Discharge: Continued Medical Work up  Expected Discharge Plan and Services Expected Discharge Plan: Skilled Nursing Facility In-house Referral: Clinical Social Work Discharge Planning Services: CM Consult Post Acute Care Choice: Home Health, Skilled Nursing Facility Living arrangements for the past 2 months: Single Family Home                                       Social Determinants of Health (SDOH) Interventions    Readmission Risk Interventions     No data to display

## 2022-03-16 NOTE — Discharge Instructions (Signed)

## 2022-03-16 NOTE — Progress Notes (Signed)
Occupational Therapy Treatment Patient Details Name: Mark [name] MRN: [id number] DOB: 1951/04/09 Today's Date: 03/16/2022   History of present illness 71 y/o male admitted 03/09/22 for respiratory distress, chest pain, and diarrhea. Pt with sepsis due to PNA. 9/2 respiratory distress requiring bipap with transfer to ICU and Afib with RVR. PMHx: asthma, ETOH dependence, HTN, COPD   OT comments  Patient sleeping in the recliner, continues to need increased processing and cues.  Needed a little more assist with stand pivot transfers this date, and difficulty achieving a full stand, but continue to participate.  SNF level rehab is recommended prior to retuning home.  OT will continue our efforts in the acute setting.     Recommendations for follow up therapy are one component of a multi-disciplinary discharge planning process, led by the attending physician.  Recommendations may be updated based on patient status, additional functional criteria and insurance authorization.    Follow Up Recommendations  Skilled nursing-short term rehab (<3 hours/day)    Assistance Recommended at Discharge Frequent or constant Supervision/Assistance  Patient can return home with the following  Assistance with cooking/housework;Help with stairs or ramp for entrance;A lot of help with bathing/dressing/bathroom;A lot of help with walking and/or transfers;Assist for transportation;Direct supervision/assist for medications management   Equipment Recommendations  Wheelchair (measurements OT);Wheelchair cushion (measurements OT)    Recommendations for Other Services      Precautions / Restrictions Precautions Precautions: Fall Precaution Comments: watch HR and O2 Restrictions Weight Bearing Restrictions: No       Mobility Bed Mobility               General bed mobility comments: up in the recliner    Transfers Overall transfer level: Needs assistance Equipment used: Rolling walker (2  wheels) Transfers: Sit to/from Stand Sit to Stand: Mod assist Stand pivot transfers: Max assist               Balance Overall balance assessment: Needs assistance Sitting-balance support: Feet supported Sitting balance-Leahy Scale: Fair Sitting balance - Comments: confusion   Standing balance support: Reliant on assistive device for balance Standing balance-Leahy Scale: Poor Standing balance comment: more of a backward lean this session                           ADL either performed or assessed with clinical judgement   ADL                   Upper Body Dressing : Moderate assistance;Sitting   Lower Body Dressing: Maximal assistance;Sit to/from stand   Toilet Transfer: Stand-pivot;BSC/3in1;Maximal Dentist Details (indicate cue type and reason): difficulty achieving full stand this date                Extremity/Trunk Assessment Upper Extremity Assessment Upper Extremity Assessment: Generalized weakness;RUE deficits/detail;LUE deficits/detail RUE Deficits / Details: Increased difficulty with hand to mouth this date.  AA for shoulder ROM RUE Sensation: WNL RUE Coordination: WNL LUE Deficits / Details: Increased difficulty with hand to mouth this date.  AA for shoulder ROM LUE Coordination: WNL   Lower Extremity Assessment Lower Extremity Assessment: Defer to PT evaluation   Cervical / Trunk Assessment Cervical / Trunk Assessment: Kyphotic    Vision       Perception     Praxis      Cognition Arousal/Alertness: Awake/alert Behavior During Therapy: Flat affect Overall Cognitive Status: Impaired/Different from baseline  Problem Solving: Slow processing, Decreased initiation, Requires verbal cues          Exercises General Exercises - Upper Extremity Shoulder Flexion: AAROM, Both, 10 reps, Seated    Shoulder Instructions       General Comments      Pertinent Vitals/  Pain       Pain Assessment Pain Assessment: No/denies pain Pain Intervention(s): Monitored during session                                                          Frequency  Min 2X/week        Progress Toward Goals  OT Goals(current goals can now be found in the care plan section)  Progress towards OT goals:  (Incremental progress)  Acute Rehab OT Goals OT Goal Formulation: With patient Time For Goal Achievement: 03/24/22 Potential to Achieve Goals: Good  Plan Discharge plan remains appropriate    Co-evaluation                 AM-PAC OT "6 Clicks" Daily Activity     Outcome Measure   Help from another person eating meals?: A Little Help from another person taking care of personal grooming?: A Little Help from another person toileting, which includes using toliet, bedpan, or urinal?: A Lot Help from another person bathing (including washing, rinsing, drying)?: A Lot Help from another person to put on and taking off regular upper body clothing?: A Lot Help from another person to put on and taking off regular lower body clothing?: Total 6 Click Score: 13    End of Session Equipment Utilized During Treatment: Rolling walker (2 wheels);Oxygen  OT Visit Diagnosis: Unsteadiness on feet (R26.81);Other abnormalities of gait and mobility (R26.89);Muscle weakness (generalized) (M62.81);Other symptoms and signs involving cognitive function   Activity Tolerance Patient limited by fatigue   Patient Left in chair;with call bell/phone within reach;with chair alarm set   Nurse Communication Mobility status        Time: 1115-5208 OT Time Calculation (min): 19 min  Charges: OT General Charges $OT Visit: 1 Visit OT Treatments $Self Care/Home Management : 8-22 mins  03/16/2022  RP, OTR/L  Acute Rehabilitation Services  Office:  [phone]   Suzanna Obey 03/16/2022, 12:07 PM

## 2022-03-16 NOTE — Progress Notes (Addendum)
PROGRESS NOTE    Mark [name]  LKG:[id number] DOB: May 17, 1951 DOA: 03/09/2022 PCP: Hillery Aldo, NP    Brief Narrative:   Patient 71 year old male with past medical history of asthma, alcohol dependence, hypertension, BPH, allergic rhinitis presented to the ED with 4-day history of productive cough, brownish sputum, acute respiratory distress requiring BiPAP.  Patient noted on work-up to be septic with concern for right upper lobe pneumonia, and also had rapid atrial fibrillation with RVR on 03/10/2022.  Patient placed on Cardizem drip and subsequently transition to oral Cardizem.  PCCM and cardiology following.  Assessment & Plan:   Principal Problem:   Acute respiratory failure with hypoxia (HCC) Active Problems:   Sepsis due to pneumonia (HCC)   Asthma exacerbation   Transaminitis   Elevated troponin   EtOH dependence (HCC)   Tobacco abuse   PNA (pneumonia)   ARF (acute renal failure) (HCC)   Dehydration   Abnormal TSH   Chronic alcohol abuse   Severe persistent asthma with acute exacerbation   AKI (acute kidney injury) (HCC)   Severe sepsis (HCC)   Atrial fibrillation with RVR (HCC)   Hypervolemia   COPD with acute exacerbation (HCC)  Acute respiratory failure with hypoxia . -Likely multifactorial secondary to right upper lobe pneumonia in the setting of an acute asthma /COPD exacerbation and concern for volume overload/CHF exacerbation.  Initially had received a CPAP, Solu-Medrol, BiPAP.  Currently on 3 L of nasal cannula oxygen.  Had received BiPAP in the nighttime.  Patient has completed course of antibiotic for pneumonia.  Currently on oral Lasix twice daily.  Was initially in the ICU for respiratory failure.  Continue DuoNebs and supportive care.  Sepsis secondary to pneumonia,  Negative blood cultures so far in 5 days.  Respiratory viral panel was negative.  Lactate was significantly elevated on presentation which has improved at this time.  MRSA PCR and urinary  Legionella. urine pneumococcus antigen negative.  Has completed course of antibiotic with Rocephin and Zithromax.  Still on supplemental oxygen and requiring BiPAP at nighttime.  Encouraged using chest vest incentive spirometry.  New onset atrial fibrillation Likely multifactorial secondary to sepsis, pulmonary issues and abnormal TSH. 2D echo with EF of 50 to 55%. TSH abnormal.  Thyroid-stimulating immunoglobulin less than 0.10.  Elevated free T4.  Due to abnormal TSH amiodarone was not started.  Currently on Cardizem long-acting as per cardiology. Cardiology following and have increased dose of Cardizem to 360 from 240 mg.  Metoprolol has been added by cardiology with improvement in heart rate.  We will continue to monitor.  Volume overload/acute diastolic CHF exacerbation Received IV fluids with sepsis diagnosis.  Received Lasix with improved status.  2 D echocardiogram with preserved LV function.  Continue oral Lasix twice daily.  Continue Jardiance.  Patient is negative balance for 4427 mL  Elevated troponin -Likely demand ischemia.  Cardiology on board.  2D echocardiogram with LV ejection fraction of 50 to 55% with no regional wall motion abnormalities.  Continue aspirin.  Essential hypertension Cardiology on board was on IV Cardizem and has been changed to oral.  Continue Lasix.  Dose of Cardizem has been increased.  Metoprolol has been added to regimen.   History of alcohol use Continue as needed Ativan CIWA protocol.  Continue thiamine, multivitamin, folic acid.  Signs of active withdrawal  Tobacco dependence -Continue nicotine patch.  Elevated LFT. Latest AST at 75, ALT 67.  History of alcohol use.  Check LFTs in AM.   Acute renal  failure Improved.  Likely secondary to prerenal azotemia in the setting of HCTZ, ARB.  UA was negative.  oral Lasix.  Renal function improving.  Latest creatinine at 0.8.   Abnormal TSH -TSH noted at 0.195.  Free T4 of 1.39.  Free T3 at 1.6. Total T3 at  79. Thyroid-stimulating immunoglobin less than 0.10.  Will need outpatient PCP follow-up for this.  Anemia/folate deficiency Hemoglobin stable at 10.8 from 11.0.  FOBT pending.  Anemia panel consistent with anemia of chronic disease.  Folate level at 4.7 continue folic acid.  Transfuse for hemoglobin less than 7.  DVT prophylaxis: Eliquis  Code Status: Full code  Family Communication:   No family at bedside.  I tried to reach the patient's on the phone listed as contact but did not go through and was unable to reach voicemail.  Disposition: SNF  Status is: Inpatient Remains inpatient appropriate because: Severity of illness, pending placement.   Consultants:  Cardiology: Dr. Cristal Deer 03/09/2022 PCCM: Dr. Marchelle Gearing 03/09/2022  Procedures:   PICC line 03/10/2022  Antimicrobials:  IV Rocephin 03/09/2022>>>>> IV azithromycin 03/09/2022   Subjective: Today, patient was seen and examined at bedside.  Denies any nausea vomiting fever chills or rigor.  Poor historian.  Has impaired memory and appears to be disoriented but Communicative.  Objective: Vitals:   03/16/22 0721 03/16/22 0807 03/16/22 0900 03/16/22 1000  BP:  (!) 150/88 (!) 148/75 125/82  Pulse:  95 80 (!) 110  Resp:   (!) 23 (!) 30  Temp:  (!) 97.2 F (36.2 C)    TempSrc:  Axillary    SpO2: 95% 94% 94% 95%  Weight:      Height:        Intake/Output Summary (Last 24 hours) at 03/16/2022 1138 Last data filed at 03/16/2022 0800 Gross per 24 hour  Intake 670 ml  Output 1100 ml  Net -430 ml    Filed Weights   03/13/22 0600 03/14/22 0500 03/16/22 0500  Weight: 78.4 kg 77.2 kg 75.8 kg    Physical examination: Body mass index is 21.46 kg/m.   General:  Average built, not in obvious distress on 3 L of oxygen by nasal cannula HENT:   No scleral pallor or icterus noted. Oral mucosa is moist.  Chest:   Diminished breath sounds bilaterally.  Coarse breath sounds noted bilaterally. CVS: S1 &S2 heard. No murmur.  Regular  rate and rhythm. Abdomen: Soft, nontender, nondistended.  Bowel sounds are heard.   Extremities: No cyanosis, clubbing or edema.  Peripheral pulses are palpable.  Right upper extremity PICC line in place Psych: Alert, awake but disoriented, Communicative, normal mood CNS:  No cranial nerve deficits.  Power equal in all extremities.   Skin: Warm and dry.  No rashes noted.   Data Reviewed:  I have personally reviewed the following labs and imaging studies.    CBC: Recent Labs  Lab 03/12/22 0446 03/13/22 0440 03/14/22 0402 03/15/22 0341 03/16/22 0401  WBC 21.1* 19.2* 20.5* 21.8* 22.7*  NEUTROABS 18.8* 17.1* 17.7* 20.1* 19.7*  HGB 10.3* 11.0* 10.8* 10.8* 11.8*  HCT 31.6* 34.2* 33.5* 33.6* 36.0*  MCV 102.9* 104.0* 102.8* 103.4* 102.3*  PLT 336 373 408* 456* 488*    Basic Metabolic Panel: Recent Labs  Lab 03/09/22 1400 03/09/22 1410 03/10/22 0258 03/10/22 1757 03/11/22 0430 03/12/22 0446 03/13/22 0440 03/14/22 0402 03/15/22 0341  NA 136   < > 138 136 138 138 138 142 137  K 3.6   < > 3.5 4.0  3.4* 4.3 4.3 4.3 4.0  CL 94*   < > 102 104 101 103 100 104 99  CO2 18*   < > 25 22 27 29 30 30 29   GLUCOSE 159*   < > 237* 285* 230* 161* 179* 124* 148*  BUN 46*   < > 39* 37* 34* 34* 30* 26* 27*  CREATININE 2.86*   < > 1.35* 1.15 1.12 0.94 0.97 0.82 0.89  CALCIUM 9.3   < > 8.6* 8.5* 8.5* 8.7* 8.9 9.0 8.6*  MG 3.4*  --  3.2* 3.1*  --  3.2*  --   --   --   PHOS  --   --  4.3 3.1  --   --   --   --   --    < > = values in this interval not displayed.    GFR: Estimated Creatinine Clearance: 84 mL/min (by C-G formula based on SCr of 0.89 mg/dL).  Liver Function Tests: Recent Labs  Lab 03/09/22 1400 03/10/22 0258 03/11/22 0430 03/12/22 0446  AST 70* 125* 100* 75*  ALT 42 54* 66* 67*  ALKPHOS 96 70 81 91  BILITOT 1.0 0.6 0.4 0.4  PROT 8.5* 6.8 6.8 6.9  ALBUMIN 2.4* 1.9* 1.8* 1.8*    CBG: Recent Labs  Lab 03/15/22 1944 03/15/22 2234 03/16/22 0405 03/16/22 0655  03/16/22 0802  GLUCAP 216* 134* 139* 143* 126*      Recent Results (from the past 240 hour(s))  Blood culture (routine x 2)     Status: None   Collection Time: 03/09/22  2:00 PM   Specimen: BLOOD  Result Value Ref Range Status   Specimen Description BLOOD SITE NOT SPECIFIED  Final   Special Requests   Final    BOTTLES DRAWN AEROBIC AND ANAEROBIC Blood Culture adequate volume   Culture   Final    NO GROWTH 5 DAYS Performed at [facility], [place] [address], [place], [place] [address]    Report Status 03/14/2022 FINAL  Final  Respiratory (~20 pathogens) panel by PCR     Status: None   Collection Time: 03/09/22  2:00 PM   Specimen: Nasopharyngeal Swab; Respiratory  Result Value Ref Range Status   Adenovirus NOT DETECTED NOT DETECTED Final   Coronavirus 229E NOT DETECTED NOT DETECTED Final    Comment: (NOTE) The Coronavirus on the Respiratory Panel, DOES NOT test for the novel  Coronavirus (2019 nCoV)    Coronavirus HKU1 NOT DETECTED NOT DETECTED Final   Coronavirus NL63 NOT DETECTED NOT DETECTED Final   Coronavirus OC43 NOT DETECTED NOT DETECTED Final   Metapneumovirus NOT DETECTED NOT DETECTED Final   Rhinovirus / Enterovirus NOT DETECTED NOT DETECTED Final   Influenza A NOT DETECTED NOT DETECTED Final   Influenza B NOT DETECTED NOT DETECTED Final   Parainfluenza Virus 1 NOT DETECTED NOT DETECTED Final   Parainfluenza Virus 2 NOT DETECTED NOT DETECTED Final   Parainfluenza Virus 3 NOT DETECTED NOT DETECTED Final   Parainfluenza Virus 4 NOT DETECTED NOT DETECTED Final   Respiratory Syncytial Virus NOT DETECTED NOT DETECTED Final   Bordetella pertussis NOT DETECTED NOT DETECTED Final   Bordetella Parapertussis NOT DETECTED NOT DETECTED Final   Chlamydophila pneumoniae NOT DETECTED NOT DETECTED Final   Mycoplasma pneumoniae NOT DETECTED NOT DETECTED Final    Comment: Performed at [facility], [place]. [address] [place], [place] [address]  Resp Panel by RT-PCR (Flu  A&B, Covid) Anterior Nasal Swab  Status: None   Collection Time: 03/09/22  2:01 PM   Specimen: Anterior Nasal Swab  Result Value Ref Range Status   SARS Coronavirus 2 by RT PCR NEGATIVE NEGATIVE Final    Comment: (NOTE) SARS-CoV-2 target nucleic acids are NOT DETECTED.  The SARS-CoV-2 RNA is generally detectable in upper respiratory specimens during the acute phase of infection. The lowest concentration of SARS-CoV-2 viral copies this assay can detect is 138 copies/mL. A negative result does not preclude SARS-Cov-2 infection and should not be used as the sole basis for treatment or other patient management decisions. A negative result may occur with  improper specimen collection/handling, submission of specimen other than nasopharyngeal swab, presence of viral mutation(s) within the areas targeted by this assay, and inadequate number of viral copies(<138 copies/mL). A negative result must be combined with clinical observations, patient history, and epidemiological information. The expected result is Negative.  Fact Sheet for Patients:  BloggerCourse.comhttps://www.fda.gov/media/152166/download  Fact Sheet for Healthcare Providers:  SeriousBroker.ithttps://www.fda.gov/media/152162/download  This test is no t yet approved or cleared by the [place] FDA and  has been authorized for detection and/or diagnosis of SARS-CoV-2 by FDA under an Emergency Use Authorization (EUA). This EUA will remain  in effect (meaning this test can be used) for the duration of the COVID-19 declaration under Section 564(b)(1) of the Act, 21 U.S.C.section 360bbb-3(b)(1), unless the authorization is terminated  or revoked sooner.       Influenza A by PCR NEGATIVE NEGATIVE Final   Influenza B by PCR NEGATIVE NEGATIVE Final    Comment: (NOTE) The Xpert Xpress SARS-CoV-2/FLU/RSV plus assay is intended as an aid in the diagnosis of influenza from Nasopharyngeal swab specimens and should not be used as a sole basis for treatment.  Nasal washings and aspirates are unacceptable for Xpert Xpress SARS-CoV-2/FLU/RSV testing.  Fact Sheet for Patients: BloggerCourse.comhttps://www.fda.gov/media/152166/download  Fact Sheet for Healthcare Providers: SeriousBroker.ithttps://www.fda.gov/media/152162/download  This test is not yet approved or cleared by the [place] FDA and has been authorized for detection and/or diagnosis of SARS-CoV-2 by FDA under an Emergency Use Authorization (EUA). This EUA will remain in effect (meaning this test can be used) for the duration of the COVID-19 declaration under Section 564(b)(1) of the Act, 21 U.S.C. section 360bbb-3(b)(1), unless the authorization is terminated or revoked.  Performed at [facility], [address] [place], [place] [address]   Blood culture (routine x 2)     Status: None   Collection Time: 03/09/22  2:04 PM   Specimen: BLOOD  Result Value Ref Range Status   Specimen Description BLOOD BLOOD RIGHT FOREARM  Final   Special Requests   Final    BOTTLES DRAWN AEROBIC AND ANAEROBIC Blood Culture adequate volume   Culture   Final    NO GROWTH 5 DAYS Performed at [facility], [address] [place], [place] [address]    Report Status 03/14/2022 FINAL  Final  Urine Culture     Status: None   Collection Time: 03/09/22  5:35 PM   Specimen: Urine, Catheterized  Result Value Ref Range Status   Specimen Description URINE, CATHETERIZED  Final   Special Requests NONE  Final   Culture   Final    NO GROWTH Performed at [facility], [address] [place], [place] [address]    Report Status 03/11/2022 FINAL  Final  MRSA Next Gen by PCR, Nasal     Status: None   Collection Time: 03/10/22  6:40 AM  Specimen: Urine, Catheterized; Nasal Swab  Result Value Ref Range Status   MRSA by PCR Next Gen NOT DETECTED NOT DETECTED Final    Comment: (NOTE) The GeneXpert MRSA Assay (FDA approved for NASAL specimens only), is one component of a comprehensive MRSA colonization  surveillance program. It is not intended to diagnose MRSA infection nor to guide or monitor treatment for MRSA infections. Test performance is not FDA approved in patients less than 48 years old. Performed at [facility], [address], [place], [place] [address]      Radiology Studies: No results found.   Scheduled Meds:  alteplase  2 mg Intracatheter Once   apixaban  5 mg Oral BID   budesonide (PULMICORT) nebulizer solution  0.5 mg Nebulization BID   Chlorhexidine Gluconate Cloth  6 each Topical Daily   diltiazem  360 mg Oral Daily   empagliflozin  10 mg Oral Daily   folic acid  1 mg Oral Daily   furosemide  20 mg Oral BID   guaiFENesin  1,200 mg Oral BID   insulin aspart  0-15 Units Subcutaneous Q4H   ipratropium  0.5 mg Nebulization BID   levalbuterol  0.63 mg Nebulization BID   loratadine  10 mg Oral Daily   methylPREDNISolone (SOLU-MEDROL) injection  120 mg Intravenous Q24H   metoprolol succinate  100 mg Oral Daily   multivitamin with minerals  1 tablet Oral Daily   nicotine  7 mg Transdermal Daily   mouth rinse  15 mL Mouth Rinse 4 times per day   pantoprazole  40 mg Oral BID AC   sodium chloride flush  10-40 mL Intracatheter Q12H   sodium chloride flush  10-40 mL Intracatheter Q12H   sodium chloride flush  3 mL Intravenous Q12H   thiamine  100 mg Oral Daily   Or   thiamine  100 mg Intravenous Daily   Continuous Infusions:    LOS: 7 days   Joycelyn Das, MD [facility] 03/16/2022, 11:38 AM

## 2022-03-16 NOTE — Progress Notes (Signed)
Physical Therapy Treatment Patient Details Name: Mark [name] MRN: [id number] DOB: 23-Apr-1951 Today's Date: 03/16/2022   History of Present Illness 71 y/o male admitted 03/09/22 for respiratory distress, chest pain, and diarrhea. Pt with sepsis due to PNA. 9/2 respiratory distress requiring bipap with transfer to ICU and Afib with RVR. PMHx: asthma, ETOH dependence, HTN, COPD    PT Comments    Pt more conversational today, oriented to self and fact he is in the hospital but otherwise remains confused and no awareness to deficits. Pt continues to have left weakness with decreased ability and tolerance for stance and gait. Will continue to follow.   SPO2 4L at rest 95% and required 6L for 94% with limited gait trials due to desaturation on 4L to 84%  HR 103-127   Recommendations for follow up therapy are one component of a multi-disciplinary discharge planning process, led by the attending physician.  Recommendations may be updated based on patient status, additional functional criteria and insurance authorization.  Follow Up Recommendations  Skilled nursing-short term rehab (<3 hours/day) Can patient physically be transported by private vehicle: No   Assistance Recommended at Discharge Frequent or constant Supervision/Assistance  Patient can return home with the following Assistance with cooking/housework;Assist for transportation;A lot of help with bathing/dressing/bathroom;Direct supervision/assist for medications management;Direct supervision/assist for financial management;Two people to help with walking and/or transfers   Equipment Recommendations  Rolling walker (2 wheels);BSC/3in1    Recommendations for Other Services       Precautions / Restrictions Precautions Precautions: Fall Precaution Comments: watch HR and O2 Restrictions Weight Bearing Restrictions: No     Mobility  Bed Mobility Overal bed mobility: Needs Assistance Bed Mobility: Supine to Sit     Supine to  sit: Min guard     General bed mobility comments: HOB 30 degrees with increased time and cues but no physical assist    Transfers Overall transfer level: Needs assistance   Transfers: Sit to/from Stand Sit to Stand: Mod assist           General transfer comment: mod assist to stand from bed and chair x 3 trials with max cues for hand placement and sequence with assist to power up    Ambulation/Gait Ambulation/Gait assistance: Min assist, +2 safety/equipment Gait Distance (Feet): 3 Feet Assistive device: Rolling walker (2 wheels) Gait Pattern/deviations: Trunk flexed, Shuffle, Decreased weight shift to left, Decreased stance time - left   Gait velocity interpretation: <1.31 ft/sec, indicative of household ambulator   General Gait Details: pt with decreased strength, clearance and step length on left with shuffling gait and flexed trunk. Pt able to walk 4', 3' then only single step on 3rd attempt at gait with close chair follow and HR 127 with need for 6L for gait with sats 94%   Stairs             Wheelchair Mobility    Modified Rankin (Stroke Patients Only)       Balance Overall balance assessment: Needs assistance   Sitting balance-Leahy Scale: Fair Sitting balance - Comments: EOB without physical assist   Standing balance support: Reliant on assistive device for balance, Bilateral upper extremity supported Standing balance-Leahy Scale: Poor Standing balance comment: bil UE support on RW and physical assist                            Cognition Arousal/Alertness: Awake/alert Behavior During Therapy: Flat affect Overall Cognitive Status: Impaired/Different from baseline Area of  Impairment: Orientation, Attention, Memory, Following commands, Safety/judgement, Problem solving                 Orientation Level: Disoriented to, Time, Situation Current Attention Level: Sustained Memory: Decreased short-term memory Following Commands:  Follows one step commands inconsistently, Follows one step commands with increased time Safety/Judgement: Decreased awareness of deficits, Decreased awareness of safety   Problem Solving: Slow processing, Decreased initiation, Requires verbal cues General Comments: pt slightly more conversational, aware he is in the hospital but cannot state name of facility, continues to state he can go home by himself with no awareness of deficits        Exercises      General Comments        Pertinent Vitals/Pain Pain Assessment Pain Score: 4  Pain Location: left leg Pain Descriptors / Indicators: Aching, Guarding, Sore Pain Intervention(s): Limited activity within patient's tolerance, Monitored during session, Repositioned    Home Living                          Prior Function            PT Goals (current goals can now be found in the care plan section) Progress towards PT goals: Progressing toward goals (limited)    Frequency    Min 3X/week      PT Plan Current plan remains appropriate    Co-evaluation              AM-PAC PT "6 Clicks" Mobility   Outcome Measure  Help needed turning from your back to your side while in a flat bed without using bedrails?: A Little Help needed moving from lying on your back to sitting on the side of a flat bed without using bedrails?: A Little Help needed moving to and from a bed to a chair (including a wheelchair)?: A Lot Help needed standing up from a chair using your arms (e.g., wheelchair or bedside chair)?: A Lot Help needed to walk in hospital room?: A Lot Help needed climbing 3-5 steps with a railing? : Total 6 Click Score: 13    End of Session Equipment Utilized During Treatment: Gait belt;Oxygen Activity Tolerance: Patient tolerated treatment well Patient left: in chair;with call bell/phone within reach;with chair alarm set Nurse Communication: Mobility status PT Visit Diagnosis: Unsteadiness on feet  (R26.81);Muscle weakness (generalized) (M62.81);Difficulty in walking, not elsewhere classified (R26.2);Other abnormalities of gait and mobility (R26.89)     Time: 1017-5102 PT Time Calculation (min) (ACUTE ONLY): 28 min  Charges:  $Therapeutic Activity: 23-37 mins                     Merryl Hacker, PT Acute Rehabilitation Services Office: [phone]    Diane Mochizuki B Patrik Turnbaugh 03/16/2022, 1:10 PM

## 2022-03-17 DIAGNOSIS — I5031 Acute diastolic (congestive) heart failure: Secondary | ICD-10-CM | POA: Diagnosis not present

## 2022-03-17 DIAGNOSIS — R778 Other specified abnormalities of plasma proteins: Secondary | ICD-10-CM | POA: Diagnosis not present

## 2022-03-17 DIAGNOSIS — R7989 Other specified abnormal findings of blood chemistry: Secondary | ICD-10-CM | POA: Diagnosis not present

## 2022-03-17 DIAGNOSIS — N179 Acute kidney failure, unspecified: Secondary | ICD-10-CM | POA: Diagnosis not present

## 2022-03-17 DIAGNOSIS — J45901 Unspecified asthma with (acute) exacerbation: Secondary | ICD-10-CM | POA: Diagnosis not present

## 2022-03-17 DIAGNOSIS — J9601 Acute respiratory failure with hypoxia: Secondary | ICD-10-CM | POA: Diagnosis not present

## 2022-03-17 DIAGNOSIS — I4891 Unspecified atrial fibrillation: Secondary | ICD-10-CM | POA: Diagnosis not present

## 2022-03-17 LAB — CBC WITH DIFFERENTIAL/PLATELET
Abs Immature Granulocytes: 0 10*3/uL (ref 0.00–0.07)
Basophils Absolute: 0 10*3/uL (ref 0.0–0.1)
Basophils Relative: 0 %
Eosinophils Absolute: 0 10*3/uL (ref 0.0–0.5)
Eosinophils Relative: 0 %
HCT: 40.6 % (ref 39.0–52.0)
Hemoglobin: 13.2 g/dL (ref 13.0–17.0)
Lymphocytes Relative: 3 %
Lymphs Abs: 0.8 10*3/uL (ref 0.7–4.0)
MCH: 33 pg (ref 26.0–34.0)
MCHC: 32.5 g/dL (ref 30.0–36.0)
MCV: 101.5 fL — ABNORMAL HIGH (ref 80.0–100.0)
Monocytes Absolute: 1.4 10*3/uL — ABNORMAL HIGH (ref 0.1–1.0)
Monocytes Relative: 5 %
Neutro Abs: 24.8 10*3/uL — ABNORMAL HIGH (ref 1.7–7.7)
Neutrophils Relative %: 92 %
Platelets: 551 10*3/uL — ABNORMAL HIGH (ref 150–400)
RBC: 4 MIL/uL — ABNORMAL LOW (ref 4.22–5.81)
RDW: 12.8 % (ref 11.5–15.5)
WBC: 27 10*3/uL — ABNORMAL HIGH (ref 4.0–10.5)
nRBC: 0 /100 WBC
nRBC: 0.1 % (ref 0.0–0.2)

## 2022-03-17 LAB — COMPREHENSIVE METABOLIC PANEL
ALT: 60 U/L — ABNORMAL HIGH (ref 0–44)
AST: 36 U/L (ref 15–41)
Albumin: 2.1 g/dL — ABNORMAL LOW (ref 3.5–5.0)
Alkaline Phosphatase: 84 U/L (ref 38–126)
Anion gap: 9 (ref 5–15)
BUN: 27 mg/dL — ABNORMAL HIGH (ref 8–23)
CO2: 27 mmol/L (ref 22–32)
Calcium: 9.1 mg/dL (ref 8.9–10.3)
Chloride: 100 mmol/L (ref 98–111)
Creatinine, Ser: 0.82 mg/dL (ref 0.61–1.24)
GFR, Estimated: >60 mL/min (ref >60)
Glucose, Bld: 133 mg/dL — ABNORMAL HIGH (ref 70–99)
Potassium: 3.9 mmol/L (ref 3.5–5.1)
Sodium: 136 mmol/L (ref 135–145)
Total Bilirubin: 0.5 mg/dL (ref 0.3–1.2)
Total Protein: 6.5 g/dL (ref 6.5–8.1)

## 2022-03-17 LAB — GLUCOSE, CAPILLARY
Glucose-Capillary: 120 mg/dL — ABNORMAL HIGH (ref 70–99)
Glucose-Capillary: 128 mg/dL — ABNORMAL HIGH (ref 70–99)
Glucose-Capillary: 134 mg/dL — ABNORMAL HIGH (ref 70–99)
Glucose-Capillary: 147 mg/dL — ABNORMAL HIGH (ref 70–99)
Glucose-Capillary: 161 mg/dL — ABNORMAL HIGH (ref 70–99)
Glucose-Capillary: 195 mg/dL — ABNORMAL HIGH (ref 70–99)

## 2022-03-17 LAB — MAGNESIUM: Magnesium: 2.2 mg/dL (ref 1.7–2.4)

## 2022-03-17 MED ORDER — METHYLPREDNISOLONE SODIUM SUCC 125 MG IJ SOLR
60.0000 mg | Freq: Every day | INTRAMUSCULAR | Status: DC
  Administered 2022-03-17 – 2022-03-18 (×2): 60 mg via INTRAVENOUS
  Filled 2022-03-17 (×2): qty 2

## 2022-03-17 MED ORDER — FUROSEMIDE 40 MG PO TABS
40.0000 mg | ORAL_TABLET | Freq: Every day | ORAL | Status: DC
  Administered 2022-03-18 – 2022-03-20 (×3): 40 mg via ORAL
  Filled 2022-03-17 (×3): qty 1

## 2022-03-17 NOTE — Progress Notes (Signed)
PROGRESS NOTE    Mark [name]  [id number] DOB: July 13, 1950 DOA: 03/09/2022 PCP: Cipriano Mile, NP    Brief Narrative:   Patient 71 year old male with past medical history of asthma, alcohol dependence, hypertension, BPH, allergic rhinitis presented to the ED with 4-day history of productive cough, brownish sputum, acute respiratory distress requiring BiPAP.  Patient noted on work-up to be septic with concern for right upper lobe pneumonia, and also had rapid atrial fibrillation with RVR on 03/10/2022.  Patient placed on Cardizem drip and subsequently transition to oral Cardizem.  PCCM and cardiology following.  Assessment & Plan:   Principal Problem:   Acute respiratory failure with hypoxia (HCC) Active Problems:   Sepsis due to pneumonia ([place])   Asthma exacerbation   Transaminitis   Elevated troponin   EtOH dependence (HCC)   Tobacco abuse   PNA (pneumonia)   ARF (acute renal failure) (HCC)   Dehydration   Abnormal TSH   Chronic alcohol abuse   Severe persistent asthma with acute exacerbation   AKI (acute kidney injury) ([place])   Severe sepsis (HCC)   Atrial fibrillation with RVR (HCC)   Hypervolemia   COPD with acute exacerbation (HCC)  Acute respiratory failure with hypoxia . -Likely multifactorial secondary to right upper lobe pneumonia in the setting of an acute asthma /COPD exacerbation and concern for volume overload/CHF exacerbation.  Initially had received  CPAP, Solu-Medrol, BiPAP.  Patient was weaned to room air and did not require BiPAP in the nighttime.   Patient has completed course of antibiotic for pneumonia.  Currently on oral Lasix twice daily.  Was initially in the ICU for respiratory failure.  Continue DuoNebs and supportive care.  Decrease Solu-Medrol dose today.  Sepsis secondary to pneumonia,  Negative blood cultures so far.  Respiratory viral panel was negative.  Lactate was significantly elevated on presentation which has improved at this time.  MRSA  PCR and urinary Legionella. urine pneumococcus antigen negative.  Has completed course of antibiotic with Rocephin and Zithromax.  Patient has improved and was off BiPAP in the nighttime.  Encouraged using chest vest incentive spirometry.  New onset atrial fibrillation Controlled at this time.  Likely multifactorial secondary to sepsis, pulmonary issues and abnormal TSH. 2D echo with EF of 50 to 55%. TSH abnormal.  Thyroid-stimulating immunoglobulin less than 0.10.  Elevated free T4.  Due to abnormal TSH amiodarone was not started.  Currently on Cardizem and metoprolol.  Cardiology  followed the patient during hospitalization.  Plan is to continue Cardizem 360 daily, Toprol 100 mg daily and Eliquis 5 mg twice a day.  Cardiology recommends follow-up with Dr. Gypsy Balsam in 1 to 2 weeks.  Volume overload/acute diastolic CHF exacerbation Continue oral Lasix Continue Jardiance.  Patient is negative balance for 5037 mL.  Seen by cardiology today.  Elevated troponin -Likely demand ischemia.  Cardiology followed the patient during hospitalization.  2D echocardiogram with LV ejection fraction of 50 to 55% with no regional wall motion abnormalities.  Continue aspirin.  Essential hypertension On Cardizem and metoprolol at this time.  Blood pressure seems to be stable.   History of alcohol use No signs of withdrawal.   CIWA protocol..  Continue thiamine, multivitamin, folic acid.    Tobacco dependence -Continue nicotine patch.  Elevated LFT. Latest AST at 75, ALT 67.  AST at 36 and ALT of 60 at this time.   Acute renal failure Improved.  Latest creatinine at 0.8.  Was on ARB and HCTZ as outpatient which is on  hold.  On Lasix orally.   Abnormal TSH -TSH noted at 0.195.  Free T4 of 1.39.  Free T3 at 1.6. Total T3 at 79. Thyroid-stimulating immunoglobin less than 0.10.  Will need outpatient PCP follow-up for this.  Anemia/folate deficiency Hemoglobin stable at 10.8 from 11.0.  FOBT has not been collected.   Anemia panel consistent with anemia of chronic disease.  Folate level at 4.7 continue folic acid.  Transfuse for hemoglobin less than 7.  DVT prophylaxis: Eliquis  Code Status: Full code  Family Communication:   No family at bedside.  I tried to reach the patient's son the phone listed as contact but did not go through and was unable to reach voicemail.  I tried to reach the patient's ex-wife but was unable to reach her as well.  Disposition: SNF  Status is: Inpatient Remains inpatient appropriate because: Severity of illness, pending placement.   Consultants:  Cardiology PCCM:   Procedures:   PICC line 03/10/2022  Antimicrobials:  IV Rocephin 03/09/2022>>>>> IV azithromycin 03/09/2022   Subjective: Today, patient was seen and examined at bedside.  Patient denies any nausea vomiting fever chills or rigor.  Was off oxygen.  Poor historian answering questions but poor orientation  Objective: Vitals:   03/17/22 1100 03/17/22 1126 03/17/22 1200 03/17/22 1300  BP: (!) 157/133   (!) 122/56  Pulse: 79 99 (!) 107 (!) 46  Resp: (!) 23 18 (!) 26 20  Temp:  (!) 96.4 F (35.8 C)    TempSrc:  Axillary    SpO2: 94% (!) 81% (!) 89% 94%  Weight:      Height:        Intake/Output Summary (Last 24 hours) at 03/17/2022 1423 Last data filed at 03/17/2022 1300 Gross per 24 hour  Intake 1120 ml  Output 1475 ml  Net -355 ml    Filed Weights   03/14/22 0500 03/16/22 0500 03/17/22 0500  Weight: 77.2 kg 75.8 kg 71.3 kg    Physical examination: Body mass index is 20.18 kg/m.   General:  Average built, not in obvious distress, on room air HENT:   No scleral pallor or icterus noted. Oral mucosa is moist.  Chest:  Diminished breath sounds bilaterally.  CVS: S1 &S2 heard. No murmur.  Regular rate and rhythm. Abdomen: Soft, nontender, nondistended.  Bowel sounds are heard.   Extremities: No cyanosis, clubbing or edema.  Peripheral pulses are palpable.  Right upper extremity PICC line in  place. Psych: Alert, awake disoriented, normal mood CNS:  No cranial nerve deficits.  Moves all extremities. Skin: Warm and dry.  No rashes noted.   Data Reviewed:  I have personally reviewed the following labs and imaging studies.    CBC: Recent Labs  Lab 03/13/22 0440 03/14/22 0402 03/15/22 0341 03/16/22 0401 03/17/22 0445  WBC 19.2* 20.5* 21.8* 22.7* 27.0*  NEUTROABS 17.1* 17.7* 20.1* 19.7* 24.8*  HGB 11.0* 10.8* 10.8* 11.8* 13.2  HCT 34.2* 33.5* 33.6* 36.0* 40.6  MCV 104.0* 102.8* 103.4* 102.3* 101.5*  PLT 373 408* 456* 488* 551*    Basic Metabolic Panel: Recent Labs  Lab 03/10/22 1757 03/11/22 0430 03/12/22 0446 03/13/22 0440 03/14/22 0402 03/15/22 0341 03/17/22 0445  NA 136   < > 138 138 142 137 136  K 4.0   < > 4.3 4.3 4.3 4.0 3.9  CL 104   < > 103 100 104 99 100  CO2 22   < > 29 30 30 29 27   GLUCOSE 285*   < >  161* 179* 124* 148* 133*  BUN 37*   < > 34* 30* 26* 27* 27*  CREATININE 1.15   < > 0.94 0.97 0.82 0.89 0.82  CALCIUM 8.5*   < > 8.7* 8.9 9.0 8.6* 9.1  MG 3.1*  --  3.2*  --   --   --  2.2  PHOS 3.1  --   --   --   --   --   --    < > = values in this interval not displayed.    GFR: Estimated Creatinine Clearance: 85.7 mL/min (by C-G formula based on SCr of 0.82 mg/dL).  Liver Function Tests: Recent Labs  Lab 03/11/22 0430 03/12/22 0446 03/17/22 0445  AST 100* 75* 36  ALT 66* 67* 60*  ALKPHOS 81 91 84  BILITOT 0.4 0.4 0.5  PROT 6.8 6.9 6.5  ALBUMIN 1.8* 1.8* 2.1*    CBG: Recent Labs  Lab 03/16/22 2006 03/16/22 2330 03/17/22 0332 03/17/22 0807 03/17/22 1131  GLUCAP 145* 129* 128* 134* 120*      Recent Results (from the past 240 hour(s))  Blood culture (routine x 2)     Status: None   Collection Time: 03/09/22  2:00 PM   Specimen: BLOOD  Result Value Ref Range Status   Specimen Description BLOOD SITE NOT SPECIFIED  Final   Special Requests   Final    BOTTLES DRAWN AEROBIC AND ANAEROBIC Blood Culture adequate volume    Culture   Final    NO GROWTH 5 DAYS Performed at [facility], [address], [place], [place] [address]    Report Status 03/14/2022 FINAL  Final  Respiratory (~20 pathogens) panel by PCR     Status: None   Collection Time: 03/09/22  2:00 PM   Specimen: Nasopharyngeal Swab; Respiratory  Result Value Ref Range Status   Adenovirus NOT DETECTED NOT DETECTED Final   Coronavirus 229E NOT DETECTED NOT DETECTED Final    Comment: (NOTE) The Coronavirus on the Respiratory Panel, DOES NOT test for the novel  Coronavirus (2019 nCoV)    Coronavirus HKU1 NOT DETECTED NOT DETECTED Final   Coronavirus NL63 NOT DETECTED NOT DETECTED Final   Coronavirus OC43 NOT DETECTED NOT DETECTED Final   Metapneumovirus NOT DETECTED NOT DETECTED Final   Rhinovirus / Enterovirus NOT DETECTED NOT DETECTED Final   Influenza A NOT DETECTED NOT DETECTED Final   Influenza B NOT DETECTED NOT DETECTED Final   Parainfluenza Virus 1 NOT DETECTED NOT DETECTED Final   Parainfluenza Virus 2 NOT DETECTED NOT DETECTED Final   Parainfluenza Virus 3 NOT DETECTED NOT DETECTED Final   Parainfluenza Virus 4 NOT DETECTED NOT DETECTED Final   Respiratory Syncytial Virus NOT DETECTED NOT DETECTED Final   Bordetella pertussis NOT DETECTED NOT DETECTED Final   Bordetella Parapertussis NOT DETECTED NOT DETECTED Final   Chlamydophila pneumoniae NOT DETECTED NOT DETECTED Final   Mycoplasma pneumoniae NOT DETECTED NOT DETECTED Final    Comment: Performed at [facility], [address] [place], [place] [address]  Resp Panel by RT-PCR (Flu A&B, Covid) Anterior Nasal Swab     Status: None   Collection Time: 03/09/22  2:01 PM   Specimen: Anterior Nasal Swab  Result Value Ref Range Status   SARS Coronavirus 2 by RT PCR NEGATIVE NEGATIVE Final    Comment: (NOTE) SARS-CoV-2 target nucleic acids are NOT DETECTED.  The SARS-CoV-2 RNA is generally detectable in upper respiratory specimens during the acute phase of infection. The  lowest concentration of SARS-CoV-2 viral copies this assay can detect is 138 copies/mL. A negative result does not preclude SARS-Cov-2 infection and should not be used as the sole basis for treatment or other patient management decisions. A negative result may occur with  improper specimen collection/handling, submission of specimen other than nasopharyngeal swab, presence of viral mutation(s) within the areas targeted by this assay, and inadequate number of viral copies(<138 copies/mL). A negative result must be combined with clinical observations, patient history, and epidemiological information. The expected result is Negative.  Fact Sheet for Patients:  BloggerCourse.com  Fact Sheet for Healthcare Providers:  SeriousBroker.it  This test is no t yet approved or cleared by the [place] FDA and  has been authorized for detection and/or diagnosis of SARS-CoV-2 by FDA under an Emergency Use Authorization (EUA). This EUA will remain  in effect (meaning this test can be used) for the duration of the COVID-19 declaration under Section 564(b)(1) of the Act, 21 U.S.C.section 360bbb-3(b)(1), unless the authorization is terminated  or revoked sooner.       Influenza A by PCR NEGATIVE NEGATIVE Final   Influenza B by PCR NEGATIVE NEGATIVE Final    Comment: (NOTE) The Xpert Xpress SARS-CoV-2/FLU/RSV plus assay is intended as an aid in the diagnosis of influenza from Nasopharyngeal swab specimens and should not be used as a sole basis for treatment. Nasal washings and aspirates are unacceptable for Xpert Xpress SARS-CoV-2/FLU/RSV testing.  Fact Sheet for Patients: BloggerCourse.com  Fact Sheet for Healthcare Providers: SeriousBroker.it  This test is not yet approved or cleared by the [place] FDA and has been authorized for detection and/or diagnosis of SARS-CoV-2 by FDA under  an Emergency Use Authorization (EUA). This EUA will remain in effect (meaning this test can be used) for the duration of the COVID-19 declaration under Section 564(b)(1) of the Act, 21 U.S.C. section 360bbb-3(b)(1), unless the authorization is terminated or revoked.  Performed at [facility], [address], [place], [place] [address]   Blood culture (routine x 2)     Status: None   Collection Time: 03/09/22  2:04 PM   Specimen: BLOOD  Result Value Ref Range Status   Specimen Description BLOOD BLOOD RIGHT FOREARM  Final   Special Requests   Final    BOTTLES DRAWN AEROBIC AND ANAEROBIC Blood Culture adequate volume   Culture   Final    NO GROWTH 5 DAYS Performed at [facility], [address], [place], [place] [address]    Report Status 03/14/2022 FINAL  Final  Urine Culture     Status: None   Collection Time: 03/09/22  5:35 PM   Specimen: Urine, Catheterized  Result Value Ref Range Status   Specimen Description URINE, CATHETERIZED  Final   Special Requests NONE  Final   Culture   Final    NO GROWTH Performed at [facility], [address] [place], [place] [address]    Report Status 03/11/2022 FINAL  Final  MRSA Next Gen by PCR, Nasal     Status: None   Collection Time: 03/10/22  6:40 AM   Specimen: Urine, Catheterized; Nasal Swab  Result Value Ref Range Status   MRSA by PCR Next Gen NOT DETECTED NOT DETECTED Final    Comment: (NOTE) The GeneXpert MRSA Assay (FDA approved for NASAL specimens only), is one component of a comprehensive MRSA colonization surveillance program. It is not intended to diagnose MRSA infection nor to guide or monitor treatment for  MRSA infections. Test performance is not FDA approved in patients less than 22 years old. Performed at [facility], [place] [address], [place], [place] [address]      Radiology Studies: No results found.   Scheduled Meds:  alteplase  2 mg Intracatheter Once   apixaban  5 mg Oral BID    budesonide (PULMICORT) nebulizer solution  0.5 mg Nebulization BID   Chlorhexidine Gluconate Cloth  6 each Topical Daily   diltiazem  360 mg Oral Daily   empagliflozin  10 mg Oral Daily   folic acid  1 mg Oral Daily   furosemide  20 mg Oral BID   guaiFENesin  1,200 mg Oral BID   insulin aspart  0-15 Units Subcutaneous Q4H   ipratropium  0.5 mg Nebulization BID   levalbuterol  0.63 mg Nebulization BID   loratadine  10 mg Oral Daily   methylPREDNISolone (SOLU-MEDROL) injection  60 mg Intravenous Daily   metoprolol succinate  100 mg Oral Daily   multivitamin with minerals  1 tablet Oral Daily   nicotine  7 mg Transdermal Daily   mouth rinse  15 mL Mouth Rinse 4 times per day   pantoprazole  40 mg Oral BID AC   sodium chloride flush  10-40 mL Intracatheter Q12H   sodium chloride flush  10-40 mL Intracatheter Q12H   sodium chloride flush  3 mL Intravenous Q12H   thiamine  100 mg Oral Daily   Or   thiamine  100 mg Intravenous Daily   Continuous Infusions:    LOS: 8 days   Flora Lipps, MD [facility] 03/17/2022, 2:23 PM

## 2022-03-17 NOTE — Progress Notes (Addendum)
Rounding Note    Patient Name: Mark [name] Date of Encounter: 03/17/2022  [place] Cardiologist: None (SKAINS)  Subjective   Atrial fibrillation converted to sinus rhythm with the addition of Toprol-XL 100 mg to his diltiazem 360 mg  Complains of pleuritic sharp pain with deep breathing   Inpatient Medications    Scheduled Meds:  alteplase  2 mg Intracatheter Once   apixaban  5 mg Oral BID   budesonide (PULMICORT) nebulizer solution  0.5 mg Nebulization BID   Chlorhexidine Gluconate Cloth  6 each Topical Daily   diltiazem  360 mg Oral Daily   empagliflozin  10 mg Oral Daily   folic acid  1 mg Oral Daily   furosemide  20 mg Oral BID   guaiFENesin  1,200 mg Oral BID   insulin aspart  0-15 Units Subcutaneous Q4H   ipratropium  0.5 mg Nebulization BID   levalbuterol  0.63 mg Nebulization BID   loratadine  10 mg Oral Daily   methylPREDNISolone (SOLU-MEDROL) injection  60 mg Intravenous Daily   metoprolol succinate  100 mg Oral Daily   multivitamin with minerals  1 tablet Oral Daily   nicotine  7 mg Transdermal Daily   mouth rinse  15 mL Mouth Rinse 4 times per day   pantoprazole  40 mg Oral BID AC   sodium chloride flush  10-40 mL Intracatheter Q12H   sodium chloride flush  10-40 mL Intracatheter Q12H   sodium chloride flush  3 mL Intravenous Q12H   thiamine  100 mg Oral Daily   Or   thiamine  100 mg Intravenous Daily   Continuous Infusions:   PRN Meds: acetaminophen **OR** acetaminophen, hydrALAZINE, ipratropium, levalbuterol, LORazepam **OR** LORazepam, meclizine, [DISCONTINUED] ondansetron **OR** ondansetron (ZOFRAN) IV, mouth rinse, mouth rinse, mouth rinse, oxyCODONE, polyethylene glycol, sodium chloride flush, sodium chloride flush, sorbitol, traZODone   Vital Signs    Vitals:   03/17/22 0100 03/17/22 0500 03/17/22 0749 03/17/22 0806  BP: 109/80  102/89   Pulse: 87  64   Resp: (!) 22  18   Temp:    (!) 97.5 F (36.4 C)  TempSrc:    Oral   SpO2: 99%  99%   Weight:  71.3 kg    Height:        Intake/Output Summary (Last 24 hours) at 03/17/2022 0817 Last data filed at 03/17/2022 0600 Gross per 24 hour  Intake 880 ml  Output 1525 ml  Net -645 ml       03/17/2022    5:00 AM 03/16/2022    5:00 AM 03/14/2022    5:00 AM  Last 3 Weights  Weight (lbs) 157 lb 3 oz 167 lb 1.7 oz 170 lb 3.1 oz  Weight (kg) 71.3 kg 75.8 kg 77.2 kg      Telemetry    Normal sinus rhythm- Personally Reviewed  ECG    No new EKG to review- Personally Reviewed  Physical Exam   Comfortable, regular rate and rhythm   Labs    High Sensitivity Troponin:   Recent Labs  Lab 03/09/22 1400 03/09/22 1653 03/09/22 1927 03/09/22 2250  TROPONINIHS 181* 150* 143* 118*      Chemistry Recent Labs  Lab 03/10/22 1757 03/11/22 0430 03/12/22 0446 03/13/22 0440 03/14/22 0402 03/15/22 0341 03/17/22 0445  NA 136 138 138   < > 142 137 136  K 4.0 3.4* 4.3   < > 4.3 4.0 3.9  CL 104 101 103   < >  104 99 100  CO2 22 27 29    < > 30 29 27   GLUCOSE 285* 230* 161*   < > 124* 148* 133*  BUN 37* 34* 34*   < > 26* 27* 27*  CREATININE 1.15 1.12 0.94   < > 0.82 0.89 0.82  CALCIUM 8.5* 8.5* 8.7*   < > 9.0 8.6* 9.1  MG 3.1*  --  3.2*  --   --   --  2.2  PROT  --  6.8 6.9  --   --   --  6.5  ALBUMIN  --  1.8* 1.8*  --   --   --  2.1*  AST  --  100* 75*  --   --   --  36  ALT  --  66* 67*  --   --   --  60*  ALKPHOS  --  81 91  --   --   --  84  BILITOT  --  0.4 0.4  --   --   --  0.5  GFRNONAA >60 >60 >60   < > >60 >60 >60  ANIONGAP 10 10 6    < > 8 9 9    < > = values in this interval not displayed.     Lipids No results for input(s): "CHOL", "TRIG", "HDL", "LABVLDL", "LDLCALC", "CHOLHDL" in the last 168 hours.  Hematology Recent Labs  Lab 03/15/22 0341 03/16/22 0401 03/17/22 0445  WBC 21.8* 22.7* 27.0*  RBC 3.25* 3.52* 4.00*  HGB 10.8* 11.8* 13.2  HCT 33.6* 36.0* 40.6  MCV 103.4* 102.3* 101.5*  MCH 33.2 33.5 33.0  MCHC 32.1 32.8 32.5  RDW 12.8  12.7 12.8  PLT 456* 488* 551*    Thyroid  No results for input(s): "TSH", "FREET4" in the last 168 hours.   BNP Recent Labs  Lab 03/11/22 0500 03/12/22 0446  BNP 586.3* 642.5*     DDimer No results for input(s): "DDIMER" in the last 168 hours.   Radiology    No results found.  Cardiac Studies   ECHO 03/11/22    1. Abnormal septal motion . Left ventricular ejection fraction, by  estimation, is 50 to 55%. The left ventricle has low normal function. The  left ventricle has no regional wall motion abnormalities. Left ventricular  diastolic parameters are  indeterminate.   2. Right ventricular systolic function is moderately reduced. The right  ventricular size is moderately enlarged.   3. The mitral valve is abnormal. Trivial mitral valve regurgitation. No  evidence of mitral stenosis.   4. The aortic valve is tricuspid. There is mild calcification of the  aortic valve. There is mild thickening of the aortic valve. Aortic valve  regurgitation is not visualized. Aortic valve sclerosis is present, with  no evidence of aortic valve stenosis.   5. The inferior vena cava is dilated in size with >50% respiratory  variability, suggesting right atrial pressure of 8 mmHg.   Patient Profile     71 y.o. male with A-fib RVR, sepsis, alcohol use, smoker  Assessment & Plan    A-fib with RVR-converted 03/15/2022 - Prior heart rates were as high as 182 on 03/10/2022 driven by pneumonia, sepsis.  EF 55%, elevated free T4.   - He continues to maintain normal sinus rhythm on exam  -- Continue Cardizem CD 360 mg daily, Toprol-XL 100 mg daily and Eliquis 5 mg twice daily  Acute on chronic diastolic CHF - He is currently on Lasix low-dose 20 mg  twice daily to keep nose even -- He put out 2 L yesterday and is net -5 L since admission --Weight is down 10 pounds since yesterday and 15 pounds since admission --Serum creatinine stable at 0.82 today --Continue Jardiance 10 mg daily and  consolidate Lasix to 40 mg once daily  Elevated troponin secondary to type II MI with underlying pulmonary disease - 2D echo showed low normal LV function with EF 50 to 55% with no focal wall motion abnormalities.  RV function moderately reduced.  Trop mildly elevated with flat trend c/w demand ischemia --Likely secondary to sepsis, COPD, atrial fibrillation with rapid ventricular response -Continue with supportive care.  Does not require cardiac rehab etc. --RV dysfunction likely related to acute hypoxemia with strain>>recommend repeating echo once he recovers from multilobar PNA --Could consider outpatient coronary CTA  Alcohol use Delirium - Has fallen in the past.  May not be a good long-term anticoagulation candidate. For now continue Eliquis.  Stable without any signs of bleeding.  I have spent a total of 35 minutes with patient reviewing 2D echo, telemetry, EKGs, labs and examining patient as well as establishing an assessment and plan that was discussed with the patient.  > 50% of time was spent in direct patient care.     For questions or updates, please contact [facility] Please consult [email or web address] for contact info under    [facility] will sign off.   Medication Recommendations: Eliquis 5 mg twice daily, Cardizem CD 360 mg daily, Jardiance 10 mg daily, Lasix 40 mg daily, Toprol-XL 100 mg daily Other recommendations (labs, testing, etc): None Follow up as an outpatient: Dr. Anne Fu in 2 weeks    Signed, Armanda Magic, MD  03/17/2022, 8:17 AM

## 2022-03-17 NOTE — Progress Notes (Signed)
Pt not wearing Bipap at this time. Pt vitals are stable, spo2 98%. RT will continue to monitor.

## 2022-03-18 DIAGNOSIS — J45901 Unspecified asthma with (acute) exacerbation: Secondary | ICD-10-CM | POA: Diagnosis not present

## 2022-03-18 DIAGNOSIS — R7989 Other specified abnormal findings of blood chemistry: Secondary | ICD-10-CM | POA: Diagnosis not present

## 2022-03-18 DIAGNOSIS — J9601 Acute respiratory failure with hypoxia: Secondary | ICD-10-CM | POA: Diagnosis not present

## 2022-03-18 DIAGNOSIS — N179 Acute kidney failure, unspecified: Secondary | ICD-10-CM | POA: Diagnosis not present

## 2022-03-18 LAB — MAGNESIUM: Magnesium: 2.1 mg/dL (ref 1.7–2.4)

## 2022-03-18 LAB — CBC
HCT: 36.8 % — ABNORMAL LOW (ref 39.0–52.0)
Hemoglobin: 12.3 g/dL — ABNORMAL LOW (ref 13.0–17.0)
MCH: 33.9 pg (ref 26.0–34.0)
MCHC: 33.4 g/dL (ref 30.0–36.0)
MCV: 101.4 fL — ABNORMAL HIGH (ref 80.0–100.0)
Platelets: 480 10*3/uL — ABNORMAL HIGH (ref 150–400)
RBC: 3.63 MIL/uL — ABNORMAL LOW (ref 4.22–5.81)
RDW: 12.9 % (ref 11.5–15.5)
WBC: 25.5 10*3/uL — ABNORMAL HIGH (ref 4.0–10.5)
nRBC: 0 % (ref 0.0–0.2)

## 2022-03-18 LAB — BASIC METABOLIC PANEL
Anion gap: 7 (ref 5–15)
BUN: 24 mg/dL — ABNORMAL HIGH (ref 8–23)
CO2: 28 mmol/L (ref 22–32)
Calcium: 9 mg/dL (ref 8.9–10.3)
Chloride: 103 mmol/L (ref 98–111)
Creatinine, Ser: 0.85 mg/dL (ref 0.61–1.24)
GFR, Estimated: >60 mL/min (ref >60)
Glucose, Bld: 130 mg/dL — ABNORMAL HIGH (ref 70–99)
Potassium: 3.8 mmol/L (ref 3.5–5.1)
Sodium: 138 mmol/L (ref 135–145)

## 2022-03-18 LAB — GLUCOSE, CAPILLARY
Glucose-Capillary: 142 mg/dL — ABNORMAL HIGH (ref 70–99)
Glucose-Capillary: 106 mg/dL — ABNORMAL HIGH (ref 70–99)
Glucose-Capillary: 135 mg/dL — ABNORMAL HIGH (ref 70–99)
Glucose-Capillary: 216 mg/dL — ABNORMAL HIGH (ref 70–99)

## 2022-03-18 MED ORDER — PREDNISONE 20 MG PO TABS
40.0000 mg | ORAL_TABLET | Freq: Every day | ORAL | Status: DC
  Administered 2022-03-19: 40 mg via ORAL
  Filled 2022-03-18: qty 2

## 2022-03-18 NOTE — Progress Notes (Deleted)
Cardiology Office Note:    Date:  03/18/2022   ID:  Mark [name], DOB [birth year], MRN [id number]  PCP:  Hillery Aldo, NP   [facility] Providers Cardiologist:  Jodelle Red, MD { Click to update primary MD,subspecialty MD or APP then REFRESH:1}    Referring MD: Hillery Aldo, NP   Chief Complaint: ***  History of Present Illness:    Mark [name] is a *** 71 y.o. male with a hx of asthma, tobacco abuse, alcohol abuse, hypertension, atrial fibrillation  Admission 9/1- seen by cardiology for elevated BNP.  Brought in by EMS with severe respiratory distress found to have acute pneumonia of right upper lobe, sepsis 2/2 pneumonia, AKI, elevated BNP, elevated troponin to be 2/2 sepsis.  He went into A-fib with RVR on 9/2 placed on diltiazem drip then transitioned to diltiazem 180 mg daily and started on Eliquis 5 mg twice daily, started on Jardiance 10 mg daily for acute diastolic/systolic heart failure and mild RV dysfunction. Toprol XL 100 mg daily eas added and diltiazem increased to 360 mg daily, in addition to Lasix 40 mg daily   Past Medical History:  Diagnosis Date   Allergic rhinitis    Asthma    BPH (benign prostatic hyperplasia)    Carpal tunnel syndrome    Hypertension     No past surgical history on file.  Current Medications: No outpatient medications have been marked as taking for the 03/26/22 encounter (Appointment) with Lissa Hoard, Zachary George, NP.     Allergies:   Patient has no known allergies.   Social History   Socioeconomic History   Marital status: Widowed    Spouse name: Not on file   Number of children: Not on file   Years of education: Not on file   Highest education level: Not on file  Occupational History   Not on file  Tobacco Use   Smoking status: Every Day    Packs/day: 0.10    Types: Cigarettes   Smokeless tobacco: Never  Substance and Sexual Activity   Alcohol use: Yes    Comment: 2 beers daily   Drug use: No   Sexual  activity: Not on file  Other Topics Concern   Not on file  Social History Narrative   Not on file   Social Determinants of Health   Financial Resource Strain: Not on file  Food Insecurity: Not on file  Transportation Needs: Not on file  Physical Activity: Not on file  Stress: Not on file  Social Connections: Not on file     Family History: The patient's ***family history includes Colon cancer in his father.  ROS:   Please see the history of present illness.    *** All other systems reviewed and are negative.  Labs/Other Studies Reviewed:    The following studies were reviewed today:  Echo 03/11/22   1. Abnormal septal motion . Left ventricular ejection fraction, by  estimation, is 50 to 55%. The left ventricle has low normal function. The  left ventricle has no regional wall motion abnormalities. Left ventricular  diastolic parameters are  indeterminate.   2. Right ventricular systolic function is moderately reduced. The right  ventricular size is moderately enlarged.   3. The mitral valve is abnormal. Trivial mitral valve regurgitation. No  evidence of mitral stenosis.   4. The aortic valve is tricuspid. There is mild calcification of the  aortic valve. There is mild thickening of the aortic valve. Aortic valve  regurgitation is not visualized.  Aortic valve sclerosis is present, with  no evidence of aortic valve stenosis.   5. The inferior vena cava is dilated in size with >50% respiratory  variability, suggesting right atrial pressure of 8 mmHg.     Recent Labs: 03/09/2022: TSH 0.195 03/12/2022: B Natriuretic Peptide 642.5 03/17/2022: ALT 60 03/18/2022: BUN 24; Creatinine, Ser 0.85; Hemoglobin 12.3; Magnesium 2.1; Platelets 480; Potassium 3.8; Sodium 138  Recent Lipid Panel No results found for: "CHOL", "TRIG", "HDL", "CHOLHDL", "VLDL", "LDLCALC", "LDLDIRECT"   Risk Assessment/Calculations:   {Does this patient have ATRIAL FIBRILLATION?:[phone]}       Physical  Exam:    VS:  There were no vitals taken for this visit.    Wt Readings from Last 3 Encounters:  03/18/22 170 lb 13.7 oz (77.5 kg)  11/20/21 169 lb 15.6 oz (77.1 kg)  11/10/19 170 lb (77.1 kg)     GEN: *** Well nourished, well developed in no acute distress HEENT: Normal NECK: No JVD; No carotid bruits CARDIAC: ***RRR, no murmurs, rubs, gallops RESPIRATORY:  Clear to auscultation without rales, wheezing or rhonchi  ABDOMEN: Soft, non-tender, non-distended MUSCULOSKELETAL:  No edema; No deformity. *** pedal pulses, ***bilaterally SKIN: Warm and dry NEUROLOGIC:  Alert and oriented x 3 PSYCHIATRIC:  Normal affect   EKG:  EKG is *** ordered today.  The ekg ordered today demonstrates ***       Diagnoses:    No diagnosis found. Assessment and Plan:     Chronic combined CHF: Elevated troponin: Atrial fibrillation:   {Are you ordering a CV Procedure (e.g. stress test, cath, DCCV, TEE, etc)?   Press F2        :[id number]}   Disposition:  Medication Adjustments/Labs and Tests Ordered: Current medicines are reviewed at length with the patient today.  Concerns regarding medicines are outlined above.  No orders of the defined types were placed in this encounter.  No orders of the defined types were placed in this encounter.   There are no Patient Instructions on file for this visit.   Signed, Levi Aland, NP  03/18/2022 8:58 AM    [facility]

## 2022-03-18 NOTE — Progress Notes (Signed)
PROGRESS NOTE    Mark [name]  KDX:[id number] DOB: 1951/05/03 DOA: 03/09/2022 PCP: Hillery Aldo, NP    Brief Narrative:   Patient 71 year old male with past medical history of asthma, alcohol dependence, hypertension, BPH, allergic rhinitis presented to the ED with 4-day history of productive cough, brownish sputum, acute respiratory distress requiring BiPAP.  Patient noted on work-up to be septic with concern for right upper lobe pneumonia, and also had rapid atrial fibrillation with RVR on 03/10/2022.  Patient placed on Cardizem drip and subsequently transition to oral Cardizem.  PCCM and cardiology followed the patient during hospitalization.    Patient has clinically remained stable and has been weaned off of oxygen and BiPAP at this time.  He is stable for transfer out of the ICU to medical bed.  Awaiting for skilled nursing facility.  Assessment & Plan:   Principal Problem:   Acute respiratory failure with hypoxia (HCC) Active Problems:   Sepsis due to pneumonia (HCC)   Asthma exacerbation   Transaminitis   Elevated troponin   EtOH dependence (HCC)   Tobacco abuse   PNA (pneumonia)   ARF (acute renal failure) (HCC)   Dehydration   Abnormal TSH   Chronic alcohol abuse   Severe persistent asthma with acute exacerbation   AKI (acute kidney injury) (HCC)   Severe sepsis (HCC)   Atrial fibrillation with RVR (HCC)   Hypervolemia   COPD with acute exacerbation (HCC)  Acute respiratory failure with hypoxia . -Likely multifactorial secondary to right upper lobe pneumonia in the setting of an acute asthma /COPD exacerbation and concern for volume overload/CHF exacerbation.  Initially had received  CPAP, Solu-Medrol, BiPAP.    Patient has completed course of antibiotic for pneumonia.  Currently on oral Lasix. Continue DuoNebs and supportive care.  We will change Solu-Medrol to oral prednisone starting today.  Been weaned off oxygen.  Not needed BiPAP.  Sepsis secondary to  pneumonia,  Negative blood cultures so far.  Respiratory viral panel was negative.  Lactate was significantly elevated on presentation which has improved at this time.  MRSA PCR and urinary Legionella. urine pneumococcus antigen negative.  Has completed course of antibiotic with Rocephin and Zithromax.  Currently on room air off BiPAP.  Continue chest vest and incentive spirometry.  New onset atrial fibrillation Controlled at this time.  Likely multifactorial secondary to sepsis, pulmonary issues and abnormal TSH. 2D echo with EF of 50 to 55%. TSH abnormal.  Thyroid-stimulating immunoglobulin less than 0.10.  Elevated free T4.  Due to abnormal TSH amiodarone was not started.  Cardiology has recommended to continue Cardizem 360 daily, Toprol 100 mg daily and Eliquis 5 mg twice a day.  Cardiology recommends follow-up with Dr. Anne Fu in 1 to 2 weeks.  Volume overload/acute diastolic CHF exacerbation Continue oral Lasix Continue Jardiance.  Patient is negative balance for 5382 mL.  Volume status continues to improve.  Elevated troponin -Likely demand ischemia.  Cardiology followed the patient during hospitalization.  2D echocardiogram with LV ejection fraction of 50 to 55% with no regional wall motion abnormalities.  Continue metoprolol and Eliquis.  Essential hypertension On Cardizem and metoprolol at this time.  Blood pressure seems to be stable.   History of alcohol use, possible mild cognitive dysfunction No signs of withdrawal.   CIWA protocol..  Continue thiamine, multivitamin, folic acid.    Tobacco dependence -Continue nicotine patch.  Elevated LFT. Noted alcohol use.  Check LFTs in 1 to 2 days   Acute renal failure Improved.  Latest creatinine at 0.8.  Was on ARB and HCTZ as outpatient which is currently on hold.  On Lasix orally.   Abnormal TSH -TSH noted at 0.195.  Free T4 of 1.39.  Free T3 at 1.6. Total T3 at 79. Thyroid-stimulating immunoglobin less than 0.10.  Will need  outpatient PCP follow-up for this.  Anemia/folate deficiency Hemoglobin today at 12.3.    Folate level at 4.7 continue folic acid.    DVT prophylaxis: Eliquis  Code Status: Full code  Family Communication:   Spoke with the patient's ex-wife on the phone yesterday.  Son is the care power of attorney for the patient is an Teaching laboratory technician and was unable to reach him.  Disposition: SNF  Status is: Inpatient Remains inpatient appropriate because: Severity of illness, pending placement.   Consultants:  Cardiology PCCM:   Procedures:   PICC line 03/10/2022  Antimicrobials:  IV Rocephin 03/09/2022>>>>> IV azithromycin 03/09/2022   Subjective: Today, patient was seen and examined at bedside.  Patient continues to feel okay.  Has some cough.  Denies any fever chills or rigor.  Answering questions but not oriented to place.  Did not require BiPAP or supplemental oxygen.  Objective: Vitals:   03/18/22 0805 03/18/22 0900 03/18/22 1000 03/18/22 1100  BP:  113/61 118/78 128/73  Pulse:  78 76 76  Resp:  (!) 26 (!) 23 (!) 25  Temp:      TempSrc:      SpO2: 97% 92% 94% 94%  Weight:      Height:        Intake/Output Summary (Last 24 hours) at 03/18/2022 1109 Last data filed at 03/18/2022 1000 Gross per 24 hour  Intake 1290 ml  Output 1395 ml  Net -105 ml    Filed Weights   03/16/22 0500 03/17/22 0500 03/18/22 0500  Weight: 75.8 kg 71.3 kg 77.5 kg    Physical examination: Body mass index is 21.94 kg/m.   General:  Average built, not in obvious distress, on room air HENT:   No scleral pallor or icterus noted. Oral mucosa is moist.  Chest:    Diminished breath sounds bilaterally.  CVS: S1 &S2 heard. No murmur.  Regular rate and rhythm. Abdomen: Soft, nontender, nondistended.  Bowel sounds are heard.   Extremities: No cyanosis, clubbing with pitting edema, peripheral pulses are palpable.  Right upper extremity PICC line in place. Psych: Alert, awake and oriented to time,  disoriented to place, normal mood CNS:  No cranial nerve deficits.  Moving extremities but generalized weakness Skin: Warm and dry.  No rashes noted.   Data Reviewed:  I have personally reviewed the following labs and imaging studies.    CBC: Recent Labs  Lab 03/13/22 0440 03/14/22 0402 03/15/22 0341 03/16/22 0401 03/17/22 0445 03/18/22 0523  WBC 19.2* 20.5* 21.8* 22.7* 27.0* 25.5*  NEUTROABS 17.1* 17.7* 20.1* 19.7* 24.8*  --   HGB 11.0* 10.8* 10.8* 11.8* 13.2 12.3*  HCT 34.2* 33.5* 33.6* 36.0* 40.6 36.8*  MCV 104.0* 102.8* 103.4* 102.3* 101.5* 101.4*  PLT 373 408* 456* 488* 551* 480*    Basic Metabolic Panel: Recent Labs  Lab 03/12/22 0446 03/13/22 0440 03/14/22 0402 03/15/22 0341 03/17/22 0445 03/18/22 0523  NA 138 138 142 137 136 138  K 4.3 4.3 4.3 4.0 3.9 3.8  CL 103 100 104 99 100 103  CO2 29 30 30 29 27 28   GLUCOSE 161* 179* 124* 148* 133* 130*  BUN 34* 30* 26* 27* 27* 24*  CREATININE 0.94  0.97 0.82 0.89 0.82 0.85  CALCIUM 8.7* 8.9 9.0 8.6* 9.1 9.0  MG 3.2*  --   --   --  2.2 2.1    GFR: Estimated Creatinine Clearance: 89.9 mL/min (by C-G formula based on SCr of 0.85 mg/dL).  Liver Function Tests: Recent Labs  Lab 03/12/22 0446 03/17/22 0445  AST 75* 36  ALT 67* 60*  ALKPHOS 91 84  BILITOT 0.4 0.5  PROT 6.9 6.5  ALBUMIN 1.8* 2.1*    CBG: Recent Labs  Lab 03/17/22 1608 03/17/22 2029 03/17/22 2354 03/18/22 0400 03/18/22 0749  GLUCAP 161* 195* 147* 142* 106*      Recent Results (from the past 240 hour(s))  Blood culture (routine x 2)     Status: None   Collection Time: 03/09/22  2:00 PM   Specimen: BLOOD  Result Value Ref Range Status   Specimen Description BLOOD SITE NOT SPECIFIED  Final   Special Requests   Final    BOTTLES DRAWN AEROBIC AND ANAEROBIC Blood Culture adequate volume   Culture   Final    NO GROWTH 5 DAYS Performed at [facility], [place] [address] [place], [place] [address]    Report Status 03/14/2022 FINAL   Final  Respiratory (~20 pathogens) panel by PCR     Status: None   Collection Time: 03/09/22  2:00 PM   Specimen: Nasopharyngeal Swab; Respiratory  Result Value Ref Range Status   Adenovirus NOT DETECTED NOT DETECTED Final   Coronavirus 229E NOT DETECTED NOT DETECTED Final    Comment: (NOTE) The Coronavirus on the Respiratory Panel, DOES NOT test for the novel  Coronavirus (2019 nCoV)    Coronavirus HKU1 NOT DETECTED NOT DETECTED Final   Coronavirus NL63 NOT DETECTED NOT DETECTED Final   Coronavirus OC43 NOT DETECTED NOT DETECTED Final   Metapneumovirus NOT DETECTED NOT DETECTED Final   Rhinovirus / Enterovirus NOT DETECTED NOT DETECTED Final   Influenza A NOT DETECTED NOT DETECTED Final   Influenza B NOT DETECTED NOT DETECTED Final   Parainfluenza Virus 1 NOT DETECTED NOT DETECTED Final   Parainfluenza Virus 2 NOT DETECTED NOT DETECTED Final   Parainfluenza Virus 3 NOT DETECTED NOT DETECTED Final   Parainfluenza Virus 4 NOT DETECTED NOT DETECTED Final   Respiratory Syncytial Virus NOT DETECTED NOT DETECTED Final   Bordetella pertussis NOT DETECTED NOT DETECTED Final   Bordetella Parapertussis NOT DETECTED NOT DETECTED Final   Chlamydophila pneumoniae NOT DETECTED NOT DETECTED Final   Mycoplasma pneumoniae NOT DETECTED NOT DETECTED Final    Comment: Performed at [facility], [place]. [address] [place], [place] [address]  Resp Panel by RT-PCR (Flu A&B, Covid) Anterior Nasal Swab     Status: None   Collection Time: 03/09/22  2:01 PM   Specimen: Anterior Nasal Swab  Result Value Ref Range Status   SARS Coronavirus 2 by RT PCR NEGATIVE NEGATIVE Final    Comment: (NOTE) SARS-CoV-2 target nucleic acids are NOT DETECTED.  The SARS-CoV-2 RNA is generally detectable in upper respiratory specimens during the acute phase of infection. The lowest concentration of SARS-CoV-2 viral copies this assay can detect is 138 copies/mL. A negative result does not preclude SARS-Cov-2 infection  and should not be used as the sole basis for treatment or other patient management decisions. A negative result may occur with  improper specimen collection/handling, submission of specimen other than nasopharyngeal swab, presence of viral mutation(s) within the areas targeted by this assay, and inadequate number of viral  copies(<138 copies/mL). A negative result must be combined with clinical observations, patient history, and epidemiological information. The expected result is Negative.  Fact Sheet for Patients:  BloggerCourse.com  Fact Sheet for Healthcare Providers:  SeriousBroker.it  This test is no t yet approved or cleared by the [place] FDA and  has been authorized for detection and/or diagnosis of SARS-CoV-2 by FDA under an Emergency Use Authorization (EUA). This EUA will remain  in effect (meaning this test can be used) for the duration of the COVID-19 declaration under Section 564(b)(1) of the Act, 21 U.S.C.section 360bbb-3(b)(1), unless the authorization is terminated  or revoked sooner.       Influenza A by PCR NEGATIVE NEGATIVE Final   Influenza B by PCR NEGATIVE NEGATIVE Final    Comment: (NOTE) The Xpert Xpress SARS-CoV-2/FLU/RSV plus assay is intended as an aid in the diagnosis of influenza from Nasopharyngeal swab specimens and should not be used as a sole basis for treatment. Nasal washings and aspirates are unacceptable for Xpert Xpress SARS-CoV-2/FLU/RSV testing.  Fact Sheet for Patients: BloggerCourse.com  Fact Sheet for Healthcare Providers: SeriousBroker.it  This test is not yet approved or cleared by the [place] FDA and has been authorized for detection and/or diagnosis of SARS-CoV-2 by FDA under an Emergency Use Authorization (EUA). This EUA will remain in effect (meaning this test can be used) for the duration of the COVID-19 declaration  under Section 564(b)(1) of the Act, 21 U.S.C. section 360bbb-3(b)(1), unless the authorization is terminated or revoked.  Performed at [facility], [address] [place], [place] [address]   Blood culture (routine x 2)     Status: None   Collection Time: 03/09/22  2:04 PM   Specimen: BLOOD  Result Value Ref Range Status   Specimen Description BLOOD BLOOD RIGHT FOREARM  Final   Special Requests   Final    BOTTLES DRAWN AEROBIC AND ANAEROBIC Blood Culture adequate volume   Culture   Final    NO GROWTH 5 DAYS Performed at [facility], [address], [place], [place] [address]    Report Status 03/14/2022 FINAL  Final  Urine Culture     Status: None   Collection Time: 03/09/22  5:35 PM   Specimen: Urine, Catheterized  Result Value Ref Range Status   Specimen Description URINE, CATHETERIZED  Final   Special Requests NONE  Final   Culture   Final    NO GROWTH Performed at [facility], [address], [place], [place] [address]    Report Status 03/11/2022 FINAL  Final  MRSA Next Gen by PCR, Nasal     Status: None   Collection Time: 03/10/22  6:40 AM   Specimen: Urine, Catheterized; Nasal Swab  Result Value Ref Range Status   MRSA by PCR Next Gen NOT DETECTED NOT DETECTED Final    Comment: (NOTE) The GeneXpert MRSA Assay (FDA approved for NASAL specimens only), is one component of a comprehensive MRSA colonization surveillance program. It is not intended to diagnose MRSA infection nor to guide or monitor treatment for MRSA infections. Test performance is not FDA approved in patients less than 23 years old. Performed at [facility], [address], [place], [place] [address]      Radiology Studies: No results found.   Scheduled Meds:  alteplase  2 mg Intracatheter Once   apixaban  5 mg Oral BID   budesonide (PULMICORT) nebulizer solution  0.5 mg Nebulization BID  Chlorhexidine Gluconate Cloth  6 each Topical Daily   diltiazem  360 mg Oral  Daily   empagliflozin  10 mg Oral Daily   folic acid  1 mg Oral Daily   furosemide  40 mg Oral Daily   guaiFENesin  1,200 mg Oral BID   insulin aspart  0-15 Units Subcutaneous Q4H   ipratropium  0.5 mg Nebulization BID   levalbuterol  0.63 mg Nebulization BID   loratadine  10 mg Oral Daily   methylPREDNISolone (SOLU-MEDROL) injection  60 mg Intravenous Daily   metoprolol succinate  100 mg Oral Daily   multivitamin with minerals  1 tablet Oral Daily   nicotine  7 mg Transdermal Daily   mouth rinse  15 mL Mouth Rinse 4 times per day   pantoprazole  40 mg Oral BID AC   sodium chloride flush  10-40 mL Intracatheter Q12H   sodium chloride flush  10-40 mL Intracatheter Q12H   sodium chloride flush  3 mL Intravenous Q12H   thiamine  100 mg Oral Daily   Or   thiamine  100 mg Intravenous Daily   Continuous Infusions:    LOS: 9 days   Flora Lipps, MD [facility] 03/18/2022, 11:09 AM

## 2022-03-19 ENCOUNTER — Telehealth: Payer: Self-pay | Admitting: Critical Care Medicine

## 2022-03-19 DIAGNOSIS — J9601 Acute respiratory failure with hypoxia: Secondary | ICD-10-CM | POA: Diagnosis not present

## 2022-03-19 DIAGNOSIS — J45901 Unspecified asthma with (acute) exacerbation: Secondary | ICD-10-CM | POA: Diagnosis not present

## 2022-03-19 DIAGNOSIS — Z720 Tobacco use: Secondary | ICD-10-CM | POA: Diagnosis not present

## 2022-03-19 DIAGNOSIS — J4541 Moderate persistent asthma with (acute) exacerbation: Secondary | ICD-10-CM

## 2022-03-19 DIAGNOSIS — N179 Acute kidney failure, unspecified: Secondary | ICD-10-CM | POA: Diagnosis not present

## 2022-03-19 DIAGNOSIS — R7989 Other specified abnormal findings of blood chemistry: Secondary | ICD-10-CM | POA: Diagnosis not present

## 2022-03-19 LAB — MAGNESIUM: Magnesium: 2 mg/dL (ref 1.7–2.4)

## 2022-03-19 LAB — GLUCOSE, CAPILLARY
Glucose-Capillary: 213 mg/dL — ABNORMAL HIGH (ref 70–99)
Glucose-Capillary: 164 mg/dL — ABNORMAL HIGH (ref 70–99)
Glucose-Capillary: 179 mg/dL — ABNORMAL HIGH (ref 70–99)
Glucose-Capillary: 70 mg/dL (ref 70–99)
Glucose-Capillary: 90 mg/dL (ref 70–99)
Glucose-Capillary: 130 mg/dL — ABNORMAL HIGH (ref 70–99)
Glucose-Capillary: 135 mg/dL — ABNORMAL HIGH (ref 70–99)
Glucose-Capillary: 182 mg/dL — ABNORMAL HIGH (ref 70–99)

## 2022-03-19 LAB — COMPREHENSIVE METABOLIC PANEL
ALT: 76 U/L — ABNORMAL HIGH (ref 0–44)
AST: 36 U/L (ref 15–41)
Albumin: 2 g/dL — ABNORMAL LOW (ref 3.5–5.0)
Alkaline Phosphatase: 74 U/L (ref 38–126)
Anion gap: 6 (ref 5–15)
BUN: 21 mg/dL (ref 8–23)
CO2: 28 mmol/L (ref 22–32)
Calcium: 8.9 mg/dL (ref 8.9–10.3)
Chloride: 103 mmol/L (ref 98–111)
Creatinine, Ser: 0.98 mg/dL (ref 0.61–1.24)
GFR, Estimated: >60 mL/min (ref >60)
Glucose, Bld: 121 mg/dL — ABNORMAL HIGH (ref 70–99)
Potassium: 4.5 mmol/L (ref 3.5–5.1)
Sodium: 137 mmol/L (ref 135–145)
Total Bilirubin: 0.7 mg/dL (ref 0.3–1.2)
Total Protein: 5.6 g/dL — ABNORMAL LOW (ref 6.5–8.1)

## 2022-03-19 LAB — CBC
HCT: 35.9 % — ABNORMAL LOW (ref 39.0–52.0)
Hemoglobin: 12.2 g/dL — ABNORMAL LOW (ref 13.0–17.0)
MCH: 33.8 pg (ref 26.0–34.0)
MCHC: 34 g/dL (ref 30.0–36.0)
MCV: 99.4 fL (ref 80.0–100.0)
Platelets: 447 10*3/uL — ABNORMAL HIGH (ref 150–400)
RBC: 3.61 MIL/uL — ABNORMAL LOW (ref 4.22–5.81)
RDW: 12.7 % (ref 11.5–15.5)
WBC: 27.3 10*3/uL — ABNORMAL HIGH (ref 4.0–10.5)
nRBC: 0 % (ref 0.0–0.2)

## 2022-03-19 MED ORDER — OXYCODONE HCL 5 MG PO TABS: 5.0000 mg | ORAL_TABLET | Freq: Four times a day (QID) | ORAL | Status: DC | PRN

## 2022-03-19 MED ORDER — PREDNISONE 20 MG PO TABS
30.0000 mg | ORAL_TABLET | Freq: Every day | ORAL | Status: DC
  Administered 2022-03-20 – 2022-03-21 (×2): 30 mg via ORAL
  Filled 2022-03-19 (×2): qty 1

## 2022-03-19 NOTE — Telephone Encounter (Signed)
Follow up requested with [facility] in 1 month for ongoing asthma management.  Steffanie Dunn, DO 03/19/22 11:58 AM [facility]

## 2022-03-19 NOTE — Progress Notes (Signed)
   NAME:  Mark [name], MRN:  [id number], DOB:  [birth year], LOS: 10 ADMISSION DATE:  03/09/2022, CONSULTATION DATE: 9/1 REFERRING MD: Janee Morn, REASON FOR CONSULT: Acute respiratory failure with hypoxia  History of Present Illness:  Mark [name] is an 71 y.o. male who presented to [facility] on 9/1 after EMS was called for dark red/brown stool per rectum, on arrival EMS found the patient in respiratory distress.  He has a pertinent past medical history of asthma, EtOH abuse, hypertension.  ED work-up notable for WBC 34.3, Lactate 7.6 > 4.6, Creatinine 2.86 (baseline 0.9-1),BNP 1.3K, troponin 181> 150.  CXR concerning for RUL PNU with acute asthma exacerbation. A code sepsis was called.  Blood cultures, urine cultures, respirate culture ordered.  Patient was started on IV Rocephin and IV azithromycin.  Per hospitalist notes patient hypoxic and emergency department.  VBG 7.27/46/57/22, patient was started on BiPAP.  Patient was admitted to the hospital service.  PCCM was consulted for acute respiratory failure with hypoxia.  Pertinent  Medical History   Past Medical History:  Diagnosis Date   Allergic rhinitis    Asthma    BPH (benign prostatic hyperplasia)    Carpal tunnel syndrome    Hypertension      Significant Hospital Events: Including procedures, antibiotic start and stop dates in addition to other pertinent events   9/1 presented [facility].  Treated as RUL pneumonia with sepsis. 9/2 remains short of breath on BiPAP.  Obtunded. 9/3 TTE> LVEF 55-60%, RV mildly enlarged 9/4 called back to assess for dyspnea  Interim History / Subjective:  Breathing is improved, still coughing some.  Objective   Blood pressure 132/73, pulse 77, temperature 97.7 F (36.5 C), temperature source Oral, resp. rate 20, height 6\' 3"  (1.905 m), weight 80.1 kg, SpO2 96 %.        Intake/Output Summary (Last 24 hours) at 03/19/2022 1146 Last data filed at 03/19/2022 0919 Gross per 24 hour   Intake 598 ml  Output 2400 ml  Net -1802 ml    Filed Weights   03/18/22 0500 03/18/22 1504 03/19/22 0431  Weight: 77.5 kg 78 kg 80.1 kg    Examination:  General: elderly man sitting up in the chair watching TV HENT: [place]/AT, eyes anicteric PULM: rhonchi resolved with coughing, no wheezing, on conversational dyspnea CV: S1S2, RRR GI: soft, ND MSK: no edema, no cyanosis Neuro: Awake, alert, answering questions appropriately. Moving all extremities.    BUN  21 Cr 0.98 WBC 27.3 H/H 12.2/35.9 Platelets 447  Ancillary tests personally reviewed    Assessment & Plan:   Atrial fibrillation with RVR Acute respiratory failure with hypoxemia from RUL, RLL pneumonia Acute COPD exacerbation due to pneumonia Acute diastolic heart failure Acute metabolic encephalopathy either due to underying dementia or acute EtOH withdrawal Tobacco abuse  Discussion: -completed antibiotics -recommend con't steroid taper- con't prednisone 40mg  daily x 3 days, 30mg  x 2 days, 20mg  x 2 days, 10mg  x 2 days, then stop  -pulmonary hygiene, OOB mobility -recommend resuming home Symbicort, consider adding spiriva -needs OP PFTs -strongly recommended complete tobacco cessation  PCCM will sign off. Please call with questions.   Best Practice (right click and "Reselect all SmartList Selections" daily)   Per primary     07-28-1977, DO 03/19/22 11:46 AM [facility]

## 2022-03-19 NOTE — TOC Progression Note (Signed)
Transition of Care [facility]) - Progression Note    Patient Details  Name: Mark [name] MRN: [id number] Date of Birth: 05-10-1951  Transition [facility] CM/SW Contact  Erin Sons, [place] Phone Number: 03/19/2022, 2:43 PM  Clinical Narrative:     CSW confirmed bed with [facility] [employer]. Pt's [employer] is not managed by [facility]; [facility] will initiate auth request. Plan to DC tomorrow pending insurance auth. CSW updated pt.   Expected Discharge Plan: Skilled Nursing Facility Barriers to Discharge: English as a second language teacher  Expected Discharge Plan and Services Expected Discharge Plan: Skilled Nursing Facility In-house Referral: Clinical Social Work Discharge Planning Services: CM Consult Post Acute Care Choice: Home Health, Skilled Nursing Facility Living arrangements for the past 2 months: Single Family Home                                       Social Determinants of Health (SDOH) Interventions    Readmission Risk Interventions     No data to display

## 2022-03-19 NOTE — Progress Notes (Signed)
PROGRESS NOTE    Mark [name]  GDJ:[id number] DOB: Aug 25, 1950 DOA: 03/09/2022 PCP: Hillery Aldo, NP    Brief Narrative:   Patient 71 year old male with past medical history of asthma, alcohol dependence, hypertension, BPH, allergic rhinitis presented to the ED with 4-day history of productive cough, brownish sputum, acute respiratory distress requiring BiPAP.  Patient noted on work-up to be septic with concern for right upper lobe pneumonia, and also had rapid atrial fibrillation with RVR on 03/10/2022.  Patient placed on Cardizem drip and subsequently transition to oral Cardizem.  PCCM and cardiology followed the patient during hospitalization.    Patient has clinically remained stable and has been weaned off of oxygen and BiPAP at this time.  Mentation has improved.  Awaiting for skilled nursing facility.  Assessment & Plan:   Principal Problem:   Acute respiratory failure with hypoxia (HCC) Active Problems:   Sepsis due to pneumonia (HCC)   Asthma exacerbation   Transaminitis   Elevated troponin   EtOH dependence (HCC)   Tobacco abuse   PNA (pneumonia)   ARF (acute renal failure) (HCC)   Dehydration   Abnormal TSH   Chronic alcohol abuse   Severe persistent asthma with acute exacerbation   AKI (acute kidney injury) (HCC)   Severe sepsis (HCC)   Atrial fibrillation with RVR (HCC)   Hypervolemia   COPD with acute exacerbation (HCC)  Acute respiratory failure with hypoxia . -Likely multifactorial secondary to right upper lobe pneumonia in the setting of an acute asthma /COPD exacerbation and concern for volume overload/CHF exacerbation.  Initially had received  CPAP, Solu-Medrol, BiPAP.    Patient has completed course of antibiotic for pneumonia.  Currently on oral Lasix. Continue DuoNebs and supportive care.  On prednisone p.o.  We will continue to cut down the doses  Sepsis secondary to pneumonia,  Sepsis physiology has resolved.  Completed course of antibiotic.    New  onset atrial fibrillation Controlled at this time.  Likely multifactorial secondary to sepsis, pulmonary issues and abnormal TSH. 2D echo with EF of 50 to 55%. TSH abnormal.  Thyroid-stimulating immunoglobulin less than 0.10.  Elevated free T4.  Due to abnormal TSH amiodarone was not started.  Cardiology has recommended to continue Cardizem 360 daily, Toprol 100 mg daily and Eliquis 5 mg twice a day.  Cardiology recommends follow-up with Dr. Anne Fu in 1 to 2 weeks.  Volume overload/acute diastolic CHF exacerbation Has improved.  Continue oral Lasix Continue Jardiance.  Patient is negative balance for 7184 mL.  Volume status continues to improve.  Elevated troponin -Likely demand ischemia.  Cardiology followed the patient during hospitalization.  2D echocardiogram with LV ejection fraction of 50 to 55% with no regional wall motion abnormalities.  Continue metoprolol and Eliquis.  Essential hypertension On Cardizem and metoprolol at this time.  Blood pressure seems to be stable.   History of alcohol use, possible mild cognitive dysfunction No signs of withdrawal.  Continue thiamine, multivitamin, folic acid.    Tobacco dependence -Continue nicotine patch.  Elevated LFT. Noted alcohol use.  Check LFTs in 1 to 2 days   Acute renal failure Improved.  Latest creatinine at 0.8.  Was on ARB and HCTZ as outpatient which is currently on hold.  On Lasix orally.   Abnormal TSH -TSH noted at 0.195.  Free T4 of 1.39.  Free T3 at 1.6. Total T3 at 79. Thyroid-stimulating immunoglobin less than 0.10.  Will need outpatient PCP follow-up for this.  Anemia/folate deficiency Hemoglobin today at 12.3.  Folate level at 4.7 continue folic acid.    Generalized deconditioning, debility.  PT has recommended the skilled nursing facility at this time.  DVT prophylaxis: Eliquis  Code Status: Full code  Family Communication:    Son is the health care power of attorney for the patient is an Engineer, technical sales  and was unable to reach him.  Disposition: SNF, medically stable for disposition when bed is found.  Status is: Inpatient Remains inpatient appropriate because: Awaiting for placement to skilled nursing facility.   Consultants:  Cardiology PCCM:   Procedures:   PICC line 03/10/2022  Antimicrobials:  IV Rocephin 03/09/2022>>>>> IV azithromycin 03/09/2022   Subjective: Today, patient was seen and examined at bedside.  Continues to feel better.  Denies any nausea vomiting fever chills shortness of breath chest pain.  Objective: Vitals:   03/19/22 0048 03/19/22 0431 03/19/22 0724 03/19/22 1055  BP: 122/69 (!) 130/93 125/89 132/73  Pulse: 78 79 73 77  Resp: 20 20 20 20   Temp: 98.6 F (37 C) 97.8 F (36.6 C) (!) 97.4 F (36.3 C) 97.7 F (36.5 C)  TempSrc: Oral Oral Oral Oral  SpO2: 93% 96% 96% 96%  Weight:  80.1 kg    Height:        Intake/Output Summary (Last 24 hours) at 03/19/2022 1256 Last data filed at 03/19/2022 0919 Gross per 24 hour  Intake 478 ml  Output 2200 ml  Net -1722 ml    Filed Weights   03/18/22 0500 03/18/22 1504 03/19/22 0431  Weight: 77.5 kg 78 kg 80.1 kg    Physical examination: Body mass index is 22.07 kg/m.   General:  Average built, not in obvious distress HENT:   No scleral pallor or icterus noted. Oral mucosa is moist.  Chest:  Diminished breath sounds bilaterally. No crackles or wheezes.  CVS: S1 &S2 heard. No murmur.  Regular rate and rhythm. Abdomen: Soft, nontender, nondistended.  Bowel sounds are heard.   Extremities: No cyanosis, clubbing with bilateral lower extremity edema.  Peripheral pulses are palpable.  Right upper extremity PICC line in place. Psych: Alert, awake and oriented, normal mood CNS:  No cranial nerve deficits.  Power equal in all extremities.   Skin: Warm and dry.  No rashes noted.  Data Reviewed:  I have personally reviewed the following labs and imaging studies.    CBC: Recent Labs  Lab 03/13/22 0440  03/14/22 0402 03/15/22 0341 03/16/22 0401 03/17/22 0445 03/18/22 0523 03/19/22 0459  WBC 19.2* 20.5* 21.8* 22.7* 27.0* 25.5* 27.3*  NEUTROABS 17.1* 17.7* 20.1* 19.7* 24.8*  --   --   HGB 11.0* 10.8* 10.8* 11.8* 13.2 12.3* 12.2*  HCT 34.2* 33.5* 33.6* 36.0* 40.6 36.8* 35.9*  MCV 104.0* 102.8* 103.4* 102.3* 101.5* 101.4* 99.4  PLT 373 408* 456* 488* 551* 480* 447*    Basic Metabolic Panel: Recent Labs  Lab 03/14/22 0402 03/15/22 0341 03/17/22 0445 03/18/22 0523 03/19/22 0459  NA 142 137 136 138 137  K 4.3 4.0 3.9 3.8 4.5  CL 104 99 100 103 103  CO2 30 29 27 28 28   GLUCOSE 124* 148* 133* 130* 121*  BUN 26* 27* 27* 24* 21  CREATININE 0.82 0.89 0.82 0.85 0.98  CALCIUM 9.0 8.6* 9.1 9.0 8.9  MG  --   --  2.2 2.1 2.0    GFR: Estimated Creatinine Clearance: 80.6 mL/min (by C-G formula based on SCr of 0.98 mg/dL).  Liver Function Tests: Recent Labs  Lab 03/17/22 0445 03/19/22 0459  AST 36 36  ALT 60* 76*  ALKPHOS 84 74  BILITOT 0.5 0.7  PROT 6.5 5.6*  ALBUMIN 2.1* 2.0*    CBG: Recent Labs  Lab 03/18/22 2158 03/19/22 0255 03/19/22 0428 03/19/22 0727 03/19/22 1053  GLUCAP 213* 164* 179* 70 90      Recent Results (from the past 240 hour(s))  Blood culture (routine x 2)     Status: None   Collection Time: 03/09/22  2:00 PM   Specimen: BLOOD  Result Value Ref Range Status   Specimen Description BLOOD SITE NOT SPECIFIED  Final   Special Requests   Final    BOTTLES DRAWN AEROBIC AND ANAEROBIC Blood Culture adequate volume   Culture   Final    NO GROWTH 5 DAYS Performed at [facility], [place] [address], [place], [place] [address]    Report Status 03/14/2022 FINAL  Final  Respiratory (~20 pathogens) panel by PCR     Status: None   Collection Time: 03/09/22  2:00 PM   Specimen: Nasopharyngeal Swab; Respiratory  Result Value Ref Range Status   Adenovirus NOT DETECTED NOT DETECTED Final   Coronavirus 229E NOT DETECTED NOT DETECTED Final    Comment:  (NOTE) The Coronavirus on the Respiratory Panel, DOES NOT test for the novel  Coronavirus (2019 nCoV)    Coronavirus HKU1 NOT DETECTED NOT DETECTED Final   Coronavirus NL63 NOT DETECTED NOT DETECTED Final   Coronavirus OC43 NOT DETECTED NOT DETECTED Final   Metapneumovirus NOT DETECTED NOT DETECTED Final   Rhinovirus / Enterovirus NOT DETECTED NOT DETECTED Final   Influenza A NOT DETECTED NOT DETECTED Final   Influenza B NOT DETECTED NOT DETECTED Final   Parainfluenza Virus 1 NOT DETECTED NOT DETECTED Final   Parainfluenza Virus 2 NOT DETECTED NOT DETECTED Final   Parainfluenza Virus 3 NOT DETECTED NOT DETECTED Final   Parainfluenza Virus 4 NOT DETECTED NOT DETECTED Final   Respiratory Syncytial Virus NOT DETECTED NOT DETECTED Final   Bordetella pertussis NOT DETECTED NOT DETECTED Final   Bordetella Parapertussis NOT DETECTED NOT DETECTED Final   Chlamydophila pneumoniae NOT DETECTED NOT DETECTED Final   Mycoplasma pneumoniae NOT DETECTED NOT DETECTED Final    Comment: Performed at [facility], [place]. [address], [place], [place] [address]  Resp Panel by RT-PCR (Flu A&B, Covid) Anterior Nasal Swab     Status: None   Collection Time: 03/09/22  2:01 PM   Specimen: Anterior Nasal Swab  Result Value Ref Range Status   SARS Coronavirus 2 by RT PCR NEGATIVE NEGATIVE Final    Comment: (NOTE) SARS-CoV-2 target nucleic acids are NOT DETECTED.  The SARS-CoV-2 RNA is generally detectable in upper respiratory specimens during the acute phase of infection. The lowest concentration of SARS-CoV-2 viral copies this assay can detect is 138 copies/mL. A negative result does not preclude SARS-Cov-2 infection and should not be used as the sole basis for treatment or other patient management decisions. A negative result may occur with  improper specimen collection/handling, submission of specimen other than nasopharyngeal swab, presence of viral mutation(s) within the areas targeted by this  assay, and inadequate number of viral copies(<138 copies/mL). A negative result must be combined with clinical observations, patient history, and epidemiological information. The expected result is Negative.  Fact Sheet for Patients:  EntrepreneurPulse.com.au  Fact Sheet for Healthcare Providers:  IncredibleEmployment.be  This test is no t yet approved or cleared by the [place] FDA and  has been authorized for detection  and/or diagnosis of SARS-CoV-2 by FDA under an Emergency Use Authorization (EUA). This EUA will remain  in effect (meaning this test can be used) for the duration of the COVID-19 declaration under Section 564(b)(1) of the Act, 21 U.S.C.section 360bbb-3(b)(1), unless the authorization is terminated  or revoked sooner.       Influenza A by PCR NEGATIVE NEGATIVE Final   Influenza B by PCR NEGATIVE NEGATIVE Final    Comment: (NOTE) The Xpert Xpress SARS-CoV-2/FLU/RSV plus assay is intended as an aid in the diagnosis of influenza from Nasopharyngeal swab specimens and should not be used as a sole basis for treatment. Nasal washings and aspirates are unacceptable for Xpert Xpress SARS-CoV-2/FLU/RSV testing.  Fact Sheet for Patients: EntrepreneurPulse.com.au  Fact Sheet for Healthcare Providers: IncredibleEmployment.be  This test is not yet approved or cleared by the [place] FDA and has been authorized for detection and/or diagnosis of SARS-CoV-2 by FDA under an Emergency Use Authorization (EUA). This EUA will remain in effect (meaning this test can be used) for the duration of the COVID-19 declaration under Section 564(b)(1) of the Act, 21 U.S.C. section 360bbb-3(b)(1), unless the authorization is terminated or revoked.  Performed at [facility], [place] [address], [place], [place] [address]   Blood culture (routine x 2)     Status: None   Collection Time: 03/09/22  2:04 PM    Specimen: BLOOD  Result Value Ref Range Status   Specimen Description BLOOD BLOOD RIGHT FOREARM  Final   Special Requests   Final    BOTTLES DRAWN AEROBIC AND ANAEROBIC Blood Culture adequate volume   Culture   Final    NO GROWTH 5 DAYS Performed at [facility], [place] [address] [place], [place] [address]    Report Status 03/14/2022 FINAL  Final  Urine Culture     Status: None   Collection Time: 03/09/22  5:35 PM   Specimen: Urine, Catheterized  Result Value Ref Range Status   Specimen Description URINE, CATHETERIZED  Final   Special Requests NONE  Final   Culture   Final    NO GROWTH Performed at [facility], [place] [address] [place], [place] [address]    Report Status 03/11/2022 FINAL  Final  MRSA Next Gen by PCR, Nasal     Status: None   Collection Time: 03/10/22  6:40 AM   Specimen: Urine, Catheterized; Nasal Swab  Result Value Ref Range Status   MRSA by PCR Next Gen NOT DETECTED NOT DETECTED Final    Comment: (NOTE) The GeneXpert MRSA Assay (FDA approved for NASAL specimens only), is one component of a comprehensive MRSA colonization surveillance program. It is not intended to diagnose MRSA infection nor to guide or monitor treatment for MRSA infections. Test performance is not FDA approved in patients less than 27 years old. Performed at [facility], [place] [address], [place], [place] [address]      Radiology Studies: No results found.   Scheduled Meds:  alteplase  2 mg Intracatheter Once   apixaban  5 mg Oral BID   budesonide (PULMICORT) nebulizer solution  0.5 mg Nebulization BID   Chlorhexidine Gluconate Cloth  6 each Topical Daily   diltiazem  360 mg Oral Daily   empagliflozin  10 mg Oral Daily   folic acid  1 mg Oral Daily   furosemide  40 mg Oral Daily   guaiFENesin  1,200 mg Oral BID   insulin aspart  0-15 Units Subcutaneous Q4H  loratadine  10 mg Oral Daily   metoprolol succinate  100 mg Oral Daily   multivitamin with minerals   1 tablet Oral Daily   nicotine  7 mg Transdermal Daily   pantoprazole  40 mg Oral BID AC   predniSONE  40 mg Oral Q breakfast   sodium chloride flush  10-40 mL Intracatheter Q12H   sodium chloride flush  3 mL Intravenous Q12H   thiamine  100 mg Oral Daily   Continuous Infusions:    LOS: 10 days   Joycelyn Das, MD [facility] 03/19/2022, 12:56 PM

## 2022-03-19 NOTE — Progress Notes (Signed)
   03/19/22 1345  Mobility  Activity Ambulated with assistance in room;Transferred from chair to bed  Level of Assistance Moderate assist, patient does 50-74%  Assistive Device None  Distance Ambulated (ft) 5 ft  Activity Response Tolerated well  $Mobility charge 1 Mobility   Mobility Specialist Progress Note  Pt was in bed and agreeable. Left in chair with all needs met and call bell in reach.   Lucious Groves Mobility Specialist

## 2022-03-19 NOTE — Telephone Encounter (Signed)
Scheduled consult on 10/11 with Dr Henrene Pastor. This will be printed on discharge papers as well as a mailed reminder.

## 2022-03-19 NOTE — Care Management Important Message (Signed)
Important Message  Patient Details  Name: Mark [name] MRN: [id number] Date of Birth: Feb 05, 1951   Medicare Important Message Given:  Yes     Renie Ora 03/19/2022, 12:11 PM

## 2022-03-20 DIAGNOSIS — Z720 Tobacco use: Secondary | ICD-10-CM | POA: Diagnosis not present

## 2022-03-20 DIAGNOSIS — J4541 Moderate persistent asthma with (acute) exacerbation: Secondary | ICD-10-CM | POA: Diagnosis not present

## 2022-03-20 DIAGNOSIS — R7989 Other specified abnormal findings of blood chemistry: Secondary | ICD-10-CM | POA: Diagnosis not present

## 2022-03-20 DIAGNOSIS — J9601 Acute respiratory failure with hypoxia: Secondary | ICD-10-CM | POA: Diagnosis not present

## 2022-03-20 LAB — BASIC METABOLIC PANEL
Anion gap: 7 (ref 5–15)
BUN: 22 mg/dL (ref 8–23)
CO2: 28 mmol/L (ref 22–32)
Calcium: 8.8 mg/dL — ABNORMAL LOW (ref 8.9–10.3)
Chloride: 102 mmol/L (ref 98–111)
Creatinine, Ser: 0.97 mg/dL (ref 0.61–1.24)
GFR, Estimated: >60 mL/min (ref >60)
Glucose, Bld: 124 mg/dL — ABNORMAL HIGH (ref 70–99)
Potassium: 4 mmol/L (ref 3.5–5.1)
Sodium: 137 mmol/L (ref 135–145)

## 2022-03-20 LAB — GLUCOSE, CAPILLARY
Glucose-Capillary: 111 mg/dL — ABNORMAL HIGH (ref 70–99)
Glucose-Capillary: 127 mg/dL — ABNORMAL HIGH (ref 70–99)
Glucose-Capillary: 142 mg/dL — ABNORMAL HIGH (ref 70–99)
Glucose-Capillary: 149 mg/dL — ABNORMAL HIGH (ref 70–99)
Glucose-Capillary: 204 mg/dL — ABNORMAL HIGH (ref 70–99)
Glucose-Capillary: 84 mg/dL (ref 70–99)
Glucose-Capillary: 90 mg/dL (ref 70–99)

## 2022-03-20 LAB — CBC
HCT: 37.8 % — ABNORMAL LOW (ref 39.0–52.0)
Hemoglobin: 12.6 g/dL — ABNORMAL LOW (ref 13.0–17.0)
MCH: 33.4 pg (ref 26.0–34.0)
MCHC: 33.3 g/dL (ref 30.0–36.0)
MCV: 100.3 fL — ABNORMAL HIGH (ref 80.0–100.0)
Platelets: 459 10*3/uL — ABNORMAL HIGH (ref 150–400)
RBC: 3.77 MIL/uL — ABNORMAL LOW (ref 4.22–5.81)
RDW: 12.8 % (ref 11.5–15.5)
WBC: 23.6 10*3/uL — ABNORMAL HIGH (ref 4.0–10.5)
nRBC: 0 % (ref 0.0–0.2)

## 2022-03-20 LAB — MAGNESIUM: Magnesium: 1.9 mg/dL (ref 1.7–2.4)

## 2022-03-20 MED ORDER — FUROSEMIDE 20 MG PO TABS
20.0000 mg | ORAL_TABLET | Freq: Every day | ORAL | Status: DC
  Administered 2022-03-21 – 2022-03-23 (×3): 20 mg via ORAL
  Filled 2022-03-20 (×3): qty 1

## 2022-03-20 NOTE — Progress Notes (Signed)
PROGRESS NOTE    Mark [name]  ZOX:[id number] DOB: May 26, 1951 DOA: 03/09/2022 PCP: Hillery Aldo, NP    Brief Narrative:   Patient 71 year old male with past medical history of asthma, alcohol dependence, hypertension, BPH, allergic rhinitis presented to the ED with 4-day history of productive cough, brownish sputum, acute respiratory distress requiring BiPAP.  Patient noted on work-up to be septic with concern for right upper lobe pneumonia, and also had rapid atrial fibrillation with RVR on 03/10/2022.  Patient placed on Cardizem drip and subsequently transition to oral Cardizem and metoprolol.  PCCM and cardiology followed the patient during hospitalization patient has subsequently improved..    At this time, clinically remained stable and has been weaned off of oxygen and BiPAP at this time.  Mentation has improved.  Awaiting for skilled nursing facility, pending insurance authorization.  Assessment & Plan:   Principal Problem:   Acute respiratory failure with hypoxia (HCC) Active Problems:   Sepsis due to pneumonia (HCC)   Asthma exacerbation   Transaminitis   Elevated troponin   EtOH dependence (HCC)   Tobacco abuse   PNA (pneumonia)   ARF (acute renal failure) (HCC)   Dehydration   Abnormal TSH   Chronic alcohol abuse   Severe persistent asthma with acute exacerbation   AKI (acute kidney injury) (HCC)   Severe sepsis (HCC)   Atrial fibrillation with RVR (HCC)   Hypervolemia   COPD with acute exacerbation (HCC)  Acute respiratory failure with hypoxia . Improved.  Off oxygen at this time.  Likely multifactorial secondary to right upper lobe pneumonia in the setting of an acute asthma /COPD exacerbation and concern for volume overload/CHF exacerbation.  Initially had received  CPAP, Solu-Medrol, BiPAP.    Patient has completed course of antibiotic for pneumonia.  Currently on oral Lasix. Continue DuoNebs and supportive care.  On prednisone p.o continue to taper down    Sepsis secondary to pneumonia,  Sepsis physiology has resolved.  Completed course of antibiotic.    New onset atrial fibrillation Controlled at this time.  Likely multifactorial secondary to sepsis, pulmonary issues and abnormal TSH. . TSH abnormal.  Thyroid-stimulating immunoglobulin less than 0.10.  Elevated free T4.  Due to abnormal TSH amiodarone was not started.  2D echo with EF of 50 to 55%. Cardiology has recommended to continue Cardizem 360 daily, Toprol 100 mg daily and Eliquis 5 mg twice a day.  Cardiology recommends follow-up with Dr. Anne Fu in 1 to 2 weeks.  Volume overload/acute diastolic CHF exacerbation Has improved.  Continue oral Lasix, Jardiance.  Patient is negative balance for 10,143 ml   Volume status continues to improve.  We will cut down the dose of Lasix to 20 mg daily for now due to large diuresis  Elevated troponin -Likely demand ischemia.  Cardiology followed the patient during hospitalization.  2D echocardiogram with LV ejection fraction of 50 to 55% with no regional wall motion abnormalities.  Continue metoprolol and Eliquis.  Essential hypertension On Cardizem and metoprolol at this time.  Blood pressure seems to be stable.   History of alcohol use, possible mild cognitive dysfunction No signs of withdrawal.  Continue thiamine, multivitamin, folic acid.  Mentation has significantly improved.  Patient is fully oriented.  Tobacco dependence -Continue nicotine patch.  Elevated LFT. Noted alcohol use.  Just AST of 36 and ALT of 76.   Acute renal failure Improved.  Latest creatinine at 0.9.  Was on ARB and HCTZ as outpatient which is currently on hold.  On Lasix  orally.   Abnormal TSH -TSH noted at 0.195.  Free T4 of 1.39.  Free T3 at 1.6. Total T3 at 79. Thyroid-stimulating immunoglobin less than 0.10.  Will need outpatient PCP follow-up for this.  Anemia/folate deficiency Hemoglobin today at 12.6.    Folate level at 4.7 continue folic acid.  MCV  elevated.  Generalized deconditioning, debility.  PT has recommended the skilled nursing facility at this time.  DVT prophylaxis: Eliquis  Code Status: Full code  Family Communication:    Son is the health care power of attorney for the patient is an Engineer, technical sales and unable to reach him despite multiple attempts.  Disposition: SNF, medically stable for disposition when bed is found.  Status is: Inpatient Remains inpatient appropriate because: Awaiting for placement to skilled nursing facility, insurance authorization pending..   Consultants:  Cardiology PCCM:   Procedures:   PICC line 03/10/2022  Antimicrobials:  IV Rocephin 03/09/2022>>>>> IV azithromycin 03/09/2022   Subjective: Today, patient was seen and examined at bedside.  Denies interval complaints.  Denies any headache nausea vomiting fever or chills.  More alert awake and communicative.   Objective: Vitals:   03/20/22 0130 03/20/22 0523 03/20/22 0726 03/20/22 1107  BP: 136/72 129/75 123/78 119/80  Pulse: 71  (!) 58   Resp: (!) 21 20 20    Temp: 98 F (36.7 C) 97.7 F (36.5 C)  (!) 97.3 F (36.3 C)  TempSrc: Oral Oral  Oral  SpO2: 95% 94% 94%   Weight: 76.6 kg 75.7 kg    Height:        Intake/Output Summary (Last 24 hours) at 03/20/2022 1255 Last data filed at 03/20/2022 1251 Gross per 24 hour  Intake 742 ml  Output 3701 ml  Net -2959 ml    Filed Weights   03/19/22 0431 03/20/22 0130 03/20/22 0523  Weight: 80.1 kg 76.6 kg 75.7 kg    Physical examination: Body mass index is 20.86 kg/m.   General:  Average built, not in obvious distress HENT:   No scleral pallor or icterus noted. Oral mucosa is moist.  Chest:    Diminished breath sounds bilaterally. No crackles or wheezes.  CVS: S1 &S2 heard. No murmur.  Regular rate and rhythm. Abdomen: Soft, nontender, nondistended.  Bowel sounds are heard.   Extremities: No cyanosis, clubbing with bilateral lower extremity edema.  Peripheral pulses are  palpable. Psych: Alert, awake and oriented, normal mood CNS:  No cranial nerve deficits.  Generalized weakness noted. Skin: Warm and dry.  No rashes noted.   Data Reviewed:  I have personally reviewed the following labs and imaging studies.    CBC: Recent Labs  Lab 03/14/22 0402 03/15/22 0341 03/16/22 0401 03/17/22 0445 03/18/22 0523 03/19/22 0459 03/20/22 0525  WBC 20.5* 21.8* 22.7* 27.0* 25.5* 27.3* 23.6*  NEUTROABS 17.7* 20.1* 19.7* 24.8*  --   --   --   HGB 10.8* 10.8* 11.8* 13.2 12.3* 12.2* 12.6*  HCT 33.5* 33.6* 36.0* 40.6 36.8* 35.9* 37.8*  MCV 102.8* 103.4* 102.3* 101.5* 101.4* 99.4 100.3*  PLT 408* 456* 488* 551* 480* 447* 459*    Basic Metabolic Panel: Recent Labs  Lab 03/15/22 0341 03/17/22 0445 03/18/22 0523 03/19/22 0459 03/20/22 0525  NA 137 136 138 137 137  K 4.0 3.9 3.8 4.5 4.0  CL 99 100 103 103 102  CO2 29 27 28 28 28   GLUCOSE 148* 133* 130* 121* 124*  BUN 27* 27* 24* 21 22  CREATININE 0.89 0.82 0.85 0.98 0.97  CALCIUM  8.6* 9.1 9.0 8.9 8.8*  MG  --  2.2 2.1 2.0 1.9    GFR: Estimated Creatinine Clearance: 77 mL/min (by C-G formula based on SCr of 0.97 mg/dL).  Liver Function Tests: Recent Labs  Lab 03/17/22 0445 03/19/22 0459  AST 36 36  ALT 60* 76*  ALKPHOS 84 74  BILITOT 0.5 0.7  PROT 6.5 5.6*  ALBUMIN 2.1* 2.0*    CBG: Recent Labs  Lab 03/19/22 1926 03/20/22 0120 03/20/22 0421 03/20/22 0902 03/20/22 1118  GLUCAP 182* 142* 127* 84 90      No results found for this or any previous visit (from the past 240 hour(s)).    Radiology Studies: No results found.   Scheduled Meds:  alteplase  2 mg Intracatheter Once   apixaban  5 mg Oral BID   budesonide (PULMICORT) nebulizer solution  0.5 mg Nebulization BID   Chlorhexidine Gluconate Cloth  6 each Topical Daily   diltiazem  360 mg Oral Daily   empagliflozin  10 mg Oral Daily   folic acid  1 mg Oral Daily   furosemide  40 mg Oral Daily   guaiFENesin  1,200 mg Oral BID    insulin aspart  0-15 Units Subcutaneous Q4H   loratadine  10 mg Oral Daily   metoprolol succinate  100 mg Oral Daily   multivitamin with minerals  1 tablet Oral Daily   nicotine  7 mg Transdermal Daily   pantoprazole  40 mg Oral BID AC   predniSONE  30 mg Oral Q breakfast   sodium chloride flush  10-40 mL Intracatheter Q12H   sodium chloride flush  3 mL Intravenous Q12H   thiamine  100 mg Oral Daily   Continuous Infusions:    LOS: 11 days   Joycelyn Das, MD [facility] 03/20/2022, 12:55 PM

## 2022-03-20 NOTE — Plan of Care (Signed)
  Problem: Clinical Measurements: Goal: Ability to maintain clinical measurements within normal limits will improve Outcome: Adequate for Discharge   Problem: Clinical Measurements: Goal: Respiratory complications will improve Outcome: Adequate for Discharge   Problem: Clinical Measurements: Goal: Cardiovascular complication will be avoided Outcome: Adequate for Discharge   

## 2022-03-20 NOTE — Progress Notes (Signed)
Physical Therapy Treatment Patient Details Name: Mark [name] MRN: [id number] DOB: [birth year] Today's Date: 03/20/2022   History of Present Illness 71 y/o male admitted 03/09/22 for respiratory distress, chest pain, and diarrhea. Pt with sepsis due to PNA. 9/2 respiratory distress requiring bipap with transfer to ICU and Afib with RVR. PMHx: asthma, ETOH dependence, HTN, COPD    PT Comments    Pt much improved today from a mobility standpoint as well as cognitive. Pt following all commands and able to relay that he wants to go home and not to rehab. For now leaving recommendation for SNF since ambulation distance not functional for household environment but pt is progressing well. Needed mod A with first sit>stand but progressed to min guard on subsequent trials. Ambulated forward and bkwd 10' 3x with seated rest break in between. HR 82 bpm, SPO2 94% on RA. PT will continue to follow.    Recommendations for follow up therapy are one component of a multi-disciplinary discharge planning process, led by the attending physician.  Recommendations may be updated based on patient status, additional functional criteria and insurance authorization.  Follow Up Recommendations  Skilled nursing-short term rehab (<3 hours/day) Can patient physically be transported by private vehicle: Yes   Assistance Recommended at Discharge Frequent or constant Supervision/Assistance  Patient can return home with the following Assistance with cooking/housework;Assist for transportation;Direct supervision/assist for medications management;Direct supervision/assist for financial management;A little help with walking and/or transfers;A little help with bathing/dressing/bathroom   Equipment Recommendations  Rolling walker (2 wheels);BSC/3in1    Recommendations for Other Services       Precautions / Restrictions Precautions Precautions: Fall Precaution Comments: watch HR and O2 Restrictions Weight Bearing  Restrictions: No     Mobility  Bed Mobility Overal bed mobility: Needs Assistance Bed Mobility: Supine to Sit     Supine to sit: Supervision     General bed mobility comments: pt able to come to EOB with increased time but  no physical assist    Transfers Overall transfer level: Needs assistance Equipment used: Rolling walker (2 wheels) Transfers: Sit to/from Stand Sit to Stand: Mod assist, Min assist, Min guard   Step pivot transfers: Min assist       General transfer comment: mod A for first sit>stand from bed, progressed to min A from recliner and then min guard with last 3 sit>stand    Ambulation/Gait Ambulation/Gait assistance: Min assist Gait Distance (Feet): 10 Feet (3x) Assistive device: Rolling walker (2 wheels) Gait Pattern/deviations: Trunk flexed, Decreased weight shift to left, Decreased stance time - left Gait velocity: decreased Gait velocity interpretation: <1.8 ft/sec, indicate of risk for recurrent falls   General Gait Details: Initially pt with L knee mild buckling but the more he used it the better it was. He ambulated 5' fwd and bkwd followed by seated rest break, 3 bouts. Min A to steady   Social research officer, government Rankin (Stroke Patients Only)       Balance Overall balance assessment: Needs assistance Sitting-balance support: Feet supported Sitting balance-Leahy Scale: Good Sitting balance - Comments: EOB without physical assist   Standing balance support: Reliant on assistive device for balance, Bilateral upper extremity supported Standing balance-Leahy Scale: Poor Standing balance comment: bil UE support on RW and physical assist  Cognition Arousal/Alertness: Awake/alert Behavior During Therapy: Flat affect Overall Cognitive Status: Impaired/Different from baseline Area of Impairment: Safety/judgement, Problem solving                   Current Attention  Level: Selective   Following Commands: Follows one step commands consistently Safety/Judgement: Decreased awareness of deficits, Decreased awareness of safety   Problem Solving: Slow processing, Decreased initiation, Requires verbal cues General Comments: pt much improved from cognitive standpoint, expressing desires for d/c, able to talk about PLOF and home set up as well as help available        Exercises      General Comments General comments (skin integrity, edema, etc.): SPO2 remained in 90's on RA, HR 82 bpm      Pertinent Vitals/Pain Pain Assessment Pain Assessment: No/denies pain    Home Living                          Prior Function            PT Goals (current goals can now be found in the care plan section) Acute Rehab PT Goals Patient Stated Goal: to get better PT Goal Formulation: With patient Time For Goal Achievement: 03/24/22 Potential to Achieve Goals: Good Progress towards PT goals: Progressing toward goals    Frequency    Min 3X/week      PT Plan Current plan remains appropriate    Co-evaluation              AM-PAC PT "6 Clicks" Mobility   Outcome Measure  Help needed turning from your back to your side while in a flat bed without using bedrails?: A Little Help needed moving from lying on your back to sitting on the side of a flat bed without using bedrails?: A Little Help needed moving to and from a bed to a chair (including a wheelchair)?: A Little Help needed standing up from a chair using your arms (e.g., wheelchair or bedside chair)?: A Little Help needed to walk in hospital room?: A Little Help needed climbing 3-5 steps with a railing? : A Lot 6 Click Score: 17    End of Session Equipment Utilized During Treatment: Gait belt Activity Tolerance: Patient tolerated treatment well Patient left: in chair;with call bell/phone within reach;with chair alarm set Nurse Communication: Mobility status PT Visit Diagnosis:  Unsteadiness on feet (R26.81);Muscle weakness (generalized) (M62.81);Difficulty in walking, not elsewhere classified (R26.2);Other abnormalities of gait and mobility (R26.89)     Time: 3382-5053 PT Time Calculation (min) (ACUTE ONLY): 30 min  Charges:  $Gait Training: 23-37 mins                     Lyanne Co, PT  Acute Rehab Services Secure chat preferred Office [phone]    Lawana Chambers Casmere Hollenbeck 03/20/2022, 4:56 PM

## 2022-03-20 NOTE — Progress Notes (Signed)
A consult had been placed to IV Therapy to dc the pt's picc line; that was done earlier this am;  see flowsheet;  RN still requesting PIV for pt; pt asking to leave the iv out, as he is no longer receiving any IV medications and is waiting placement at a SNF;  sent secure chat to Dr Tyson Babinski explaining vein preservation; received order back:  Ok to leave IV out unless needed.  RN aware and will place the order.  Thank you;

## 2022-03-21 DIAGNOSIS — J9601 Acute respiratory failure with hypoxia: Secondary | ICD-10-CM | POA: Diagnosis not present

## 2022-03-21 DIAGNOSIS — R7989 Other specified abnormal findings of blood chemistry: Secondary | ICD-10-CM | POA: Diagnosis not present

## 2022-03-21 DIAGNOSIS — J4541 Moderate persistent asthma with (acute) exacerbation: Secondary | ICD-10-CM | POA: Diagnosis not present

## 2022-03-21 DIAGNOSIS — Z720 Tobacco use: Secondary | ICD-10-CM | POA: Diagnosis not present

## 2022-03-21 LAB — CBC
HCT: 37.1 % — ABNORMAL LOW (ref 39.0–52.0)
Hemoglobin: 12.1 g/dL — ABNORMAL LOW (ref 13.0–17.0)
MCH: 33.2 pg (ref 26.0–34.0)
MCHC: 32.6 g/dL (ref 30.0–36.0)
MCV: 101.6 fL — ABNORMAL HIGH (ref 80.0–100.0)
Platelets: 374 10*3/uL (ref 150–400)
RBC: 3.65 MIL/uL — ABNORMAL LOW (ref 4.22–5.81)
RDW: 13 % (ref 11.5–15.5)
WBC: 19.2 10*3/uL — ABNORMAL HIGH (ref 4.0–10.5)
nRBC: 0 % (ref 0.0–0.2)

## 2022-03-21 LAB — MAGNESIUM: Magnesium: 1.9 mg/dL (ref 1.7–2.4)

## 2022-03-21 LAB — BASIC METABOLIC PANEL
Anion gap: 8 (ref 5–15)
BUN: 19 mg/dL (ref 8–23)
CO2: 30 mmol/L (ref 22–32)
Calcium: 8.5 mg/dL — ABNORMAL LOW (ref 8.9–10.3)
Chloride: 97 mmol/L — ABNORMAL LOW (ref 98–111)
Creatinine, Ser: 0.95 mg/dL (ref 0.61–1.24)
GFR, Estimated: >60 mL/min (ref >60)
Glucose, Bld: 85 mg/dL (ref 70–99)
Potassium: 3.8 mmol/L (ref 3.5–5.1)
Sodium: 135 mmol/L (ref 135–145)

## 2022-03-21 LAB — GLUCOSE, CAPILLARY
Glucose-Capillary: 75 mg/dL (ref 70–99)
Glucose-Capillary: 114 mg/dL — ABNORMAL HIGH (ref 70–99)
Glucose-Capillary: 125 mg/dL — ABNORMAL HIGH (ref 70–99)
Glucose-Capillary: 192 mg/dL — ABNORMAL HIGH (ref 70–99)
Glucose-Capillary: 222 mg/dL — ABNORMAL HIGH (ref 70–99)

## 2022-03-21 MED ORDER — INSULIN ASPART 100 UNIT/ML IJ SOLN
0.0000 [IU] | Freq: Three times a day (TID) | INTRAMUSCULAR | Status: DC
  Administered 2022-03-21: 3 [IU] via SUBCUTANEOUS
  Administered 2022-03-21: 5 [IU] via SUBCUTANEOUS
  Administered 2022-03-22: 3 [IU] via SUBCUTANEOUS

## 2022-03-21 MED ORDER — PREDNISONE 20 MG PO TABS
20.0000 mg | ORAL_TABLET | Freq: Every day | ORAL | Status: DC
  Administered 2022-03-22 – 2022-03-23 (×2): 20 mg via ORAL
  Filled 2022-03-21 (×2): qty 1

## 2022-03-21 NOTE — Progress Notes (Signed)
Physical Therapy Treatment Patient Details Name: Mark [name] MRN: [id number] DOB: Jun 13, 1951 Today's Date: 03/21/2022   History of Present Illness 71 y/o male admitted 03/09/22 for respiratory distress, chest pain, and diarrhea. Pt with sepsis due to PNA. 9/2 respiratory distress requiring bipap with transfer to ICU and Afib with RVR. PMHx: asthma, ETOH dependence, HTN, COPD    PT Comments    Pt tolerates treatment well and is progressing with gait training, ambulating for increased distances. Pt also demonstrates improved transfer quality, only requiring verbal cueing and no physical assistance. PT continues to recommend SNF placement at this time, however if pt's son or family are able to provide frequent assistance (help with ADLs and close supervision for ambulation) within the home initially then the pt could return home with HHPT.  Recommendations for follow up therapy are one component of a multi-disciplinary discharge planning process, led by the attending physician.  Recommendations may be updated based on patient status, additional functional criteria and insurance authorization.  Follow Up Recommendations  Skilled nursing-short term rehab (<3 hours/day) Can patient physically be transported by private vehicle: Yes   Assistance Recommended at Discharge Intermittent Supervision/Assistance  Patient can return home with the following A little help with walking and/or transfers;A little help with bathing/dressing/bathroom;Assistance with cooking/housework;Direct supervision/assist for medications management;Direct supervision/assist for financial management;Assist for transportation;Help with stairs or ramp for entrance   Equipment Recommendations  Rolling walker (2 wheels);BSC/3in1 (pt reports he owns a RW, would need to confirm with family)    Recommendations for Other Services       Precautions / Restrictions Precautions Precautions: Fall Precaution Comments: watch HR and  O2 Restrictions Weight Bearing Restrictions: No     Mobility  Bed Mobility Overal bed mobility: Needs Assistance Bed Mobility: Supine to Sit     Supine to sit: Modified independent (Device/Increase time), HOB elevated          Transfers Overall transfer level: Needs assistance Equipment used: Rolling walker (2 wheels) Transfers: Sit to/from Stand Sit to Stand: Min guard           General transfer comment: cues for hand placement    Ambulation/Gait Ambulation/Gait assistance: Min guard Gait Distance (Feet): 40 Feet (additional trial of 20') Assistive device: Rolling walker (2 wheels) Gait Pattern/deviations: Step-through pattern Gait velocity: reduced Gait velocity interpretation: <1.8 ft/sec, indicate of risk for recurrent falls   General Gait Details: pt with slowed step-through gait, picking up walker to turn. PT provides cues for safety when transitioning into transfer, turning walker and feeling both legs against chair rather than pivoting into a sit   Stairs             Wheelchair Mobility    Modified Rankin (Stroke Patients Only)       Balance Overall balance assessment: Needs assistance Sitting-balance support: No upper extremity supported, Feet supported Sitting balance-Leahy Scale: Good     Standing balance support: Reliant on assistive device for balance, Single extremity supported Standing balance-Leahy Scale: Poor                              Cognition Arousal/Alertness: Awake/alert Behavior During Therapy: Agitated (mildly agitated initially, becomes more calm as session progresses) Overall Cognitive Status: Impaired/Different from baseline Area of Impairment: Safety/judgement, Problem solving                         Safety/Judgement: Decreased awareness of safety, Decreased  awareness of deficits   Problem Solving: Slow processing          Exercises      General Comments General comments (skin  integrity, edema, etc.): VSS on RA, pt with intermittent coughing during session      Pertinent Vitals/Pain Pain Assessment Pain Assessment: No/denies pain    Home Living                          Prior Function            PT Goals (current goals can now be found in the care plan section) Acute Rehab PT Goals Patient Stated Goal: to get better Progress towards PT goals: Progressing toward goals    Frequency    Min 3X/week      PT Plan Current plan remains appropriate    Co-evaluation              AM-PAC PT "6 Clicks" Mobility   Outcome Measure  Help needed turning from your back to your side while in a flat bed without using bedrails?: None Help needed moving from lying on your back to sitting on the side of a flat bed without using bedrails?: None Help needed moving to and from a bed to a chair (including a wheelchair)?: A Little Help needed standing up from a chair using your arms (e.g., wheelchair or bedside chair)?: A Little Help needed to walk in hospital room?: A Little Help needed climbing 3-5 steps with a railing? : A Lot 6 Click Score: 19    End of Session   Activity Tolerance: Patient tolerated treatment well Patient left: in bed;with call bell/phone within reach;with bed alarm set Nurse Communication: Mobility status PT Visit Diagnosis: Unsteadiness on feet (R26.81);Muscle weakness (generalized) (M62.81);Difficulty in walking, not elsewhere classified (R26.2);Other abnormalities of gait and mobility (R26.89)     Time: 4235-3614 PT Time Calculation (min) (ACUTE ONLY): 20 min  Charges:  $Gait Training: 8-22 mins                     Arlyss Gandy, PT, DPT Acute Rehabilitation Office [phone]    Arlyss Gandy 03/21/2022, 10:34 AM

## 2022-03-21 NOTE — TOC Progression Note (Addendum)
Transition of Care [facility]) - Progression Note    Patient Details  Name: Mark [name] MRN: [id number] Date of Birth: [birth year]  Transition of Care [facility]) CM/SW Contact  Mark [name], [place] Phone Number: 03/21/2022, 10:32 AM  Clinical Narrative:     CSW is informed by [facility] liaison that their staff working on [employer] is being informed by [facility] that their facility is not in network though liaison explains that they are in network with all [employer]. Liaison requests CSW double check [employer] portal to see if [facility] plan is managed by [employer]. CSW checked [employer] which states pt's [employer] is not managed by [employer]. CSW called [employer] and confirmed that pt's [employer] has not been managed by [employer] since January 2023.   1530: As of 01/06/22, Mark [name] is supposed to be managing all [facility] SNF auths though [employer] portal says they do not manage pt and CSW received same information when calling [employer]. Notified TOC Director who has escalated issue to [employer] rep.   CSW called [employer] directly to check on SNF auth status. CSW is informed they do not have a SNF auth on file and only have inpatient hospital auth for [facility]. CSW notified liaison; they will follow up with [employer] regarding auth issues.   Expected Discharge Plan: Skilled Nursing Facility Barriers to Discharge: English as a second language teacher  Expected Discharge Plan and Services Expected Discharge Plan: Skilled Nursing Facility In-house Referral: Clinical Social Work Discharge Planning Services: CM Consult Post Acute Care Choice: Home Health, Skilled Nursing Facility Living arrangements for the past 2 months: Single Family Home                                       Social Determinants of Health (SDOH) Interventions    Readmission Risk Interventions     No data to display

## 2022-03-21 NOTE — Progress Notes (Signed)
Occupational Therapy Treatment Patient Details Name: Mark [name] MRN: [id number] DOB: 10/26/1950 Today's Date: 03/21/2022   History of present illness 71 y/o male admitted 03/09/22 for respiratory distress, chest pain, and diarrhea. Pt with sepsis due to PNA. 9/2 respiratory distress requiring bipap with transfer to ICU and Afib with RVR. PMHx: asthma, ETOH dependence, HTN, COPD   OT comments  Patient received sitting on EOB and was resistant to OT initially stating he was tired from PT session but agreeable to self care tasks at sink, seated. Patient was HHA to transfer from EOB to chair at sink and performed grooming with setup and max assist to change socks due to difficulty reaching feet. Patient expressed desire to return home and would require assistance from family to safely return home with HHOT.  Currently discharge recommendations continue with SNF.    Recommendations for follow up therapy are one component of a multi-disciplinary discharge planning process, led by the attending physician.  Recommendations may be updated based on patient status, additional functional criteria and insurance authorization.    Follow Up Recommendations  Skilled nursing-short term rehab (<3 hours/day)    Assistance Recommended at Discharge Frequent or constant Supervision/Assistance  Patient can return home with the following  Assistance with cooking/housework;Help with stairs or ramp for entrance;A lot of help with bathing/dressing/bathroom;A lot of help with walking and/or transfers;Assist for transportation;Direct supervision/assist for medications management   Equipment Recommendations  Wheelchair (measurements OT);Wheelchair cushion (measurements OT)    Recommendations for Other Services      Precautions / Restrictions Precautions Precautions: Fall Precaution Comments: watch HR and O2 Restrictions Weight Bearing Restrictions: No       Mobility Bed Mobility Overal bed mobility: Needs  Assistance             General bed mobility comments: sitting on EOB upon entry and at end of session    Transfers Overall transfer level: Needs assistance Equipment used: None Transfers: Sit to/from Stand, Bed to chair/wheelchair/BSC Sit to Stand: Min guard           General transfer comment: transfer from EOB to chair at sink with min guard assist     Balance Overall balance assessment: Needs assistance Sitting-balance support: No upper extremity supported, Feet supported Sitting balance-Leahy Scale: Good Sitting balance - Comments: sitting on EOB upon entry   Standing balance support: Reliant on assistive device for balance Standing balance-Leahy Scale: Poor Standing balance comment: reliant on HHA and sink when standing                           ADL either performed or assessed with clinical judgement   ADL Overall ADL's : Needs assistance/impaired     Grooming: Wash/dry hands;Wash/dry face;Oral care;Set up;Sitting Grooming Details (indicate cue type and reason): declined standing at sink stating he was fatigued from PT session             Lower Body Dressing: Maximal assistance;Sit to/from stand Lower Body Dressing Details (indicate cue type and reason): difficulty reaching feet to doff and donn socks Toilet Transfer: Hydrographic surveyor Details (indicate cue type and reason): simulated to chair           General ADL Comments: resistance to therapy initially but agreed to grooming and LB dressing seated at sink    Extremity/Trunk Assessment Upper Extremity Assessment RUE Sensation: WNL RUE Coordination: WNL LUE Coordination: WNL  Vision       Perception     Praxis      Cognition Arousal/Alertness: Awake/alert Behavior During Therapy: Agitated (agitated initally stating he is constantly being asked to do something and that PT was with him earlier. Patient was easily calmed and agreed to participate) Overall  Cognitive Status: Impaired/Different from baseline Area of Impairment: Safety/judgement, Problem solving                         Safety/Judgement: Decreased awareness of safety, Decreased awareness of deficits   Problem Solving: Slow processing General Comments: expressed desire to discharge home        Exercises      Shoulder Instructions       General Comments VSS on RA, pt with intermittent coughing during session    Pertinent Vitals/ Pain       Pain Assessment Pain Assessment: No/denies pain Pain Intervention(s): Monitored during session  Home Living                                          Prior Functioning/Environment              Frequency  Min 2X/week        Progress Toward Goals  OT Goals(current goals can now be found in the care plan section)  Progress towards OT goals: Progressing toward goals  Acute Rehab OT Goals Patient Stated Goal: go home OT Goal Formulation: With patient Time For Goal Achievement: 03/24/22 Potential to Achieve Goals: Good ADL Goals Pt Will Perform Grooming: Independently;standing Pt Will Perform Lower Body Bathing: Independently;sit to/from stand;sitting/lateral leans Pt Will Perform Lower Body Dressing: Independently;sitting/lateral leans;sit to/from stand Pt Will Transfer to Toilet: Independently;ambulating Pt Will Perform Toileting - Clothing Manipulation and hygiene: Independently;sitting/lateral leans;sit to/from stand  Plan Discharge plan remains appropriate    Co-evaluation                 AM-PAC OT "6 Clicks" Daily Activity     Outcome Measure   Help from another person eating meals?: A Little Help from another person taking care of personal grooming?: A Little Help from another person toileting, which includes using toliet, bedpan, or urinal?: A Lot Help from another person bathing (including washing, rinsing, drying)?: A Lot Help from another person to put on and taking  off regular upper body clothing?: A Lot Help from another person to put on and taking off regular lower body clothing?: A Lot 6 Click Score: 14    End of Session Equipment Utilized During Treatment: Gait belt  OT Visit Diagnosis: Unsteadiness on feet (R26.81);Other abnormalities of gait and mobility (R26.89);Muscle weakness (generalized) (M62.81);Other symptoms and signs involving cognitive function   Activity Tolerance Patient tolerated treatment well   Patient Left in bed;with call bell/phone within reach;with bed alarm set;Other (comment) (seated on EOB)   Nurse Communication Mobility status        Time: 7858-8502 OT Time Calculation (min): 21 min  Charges: OT General Charges $OT Visit: 1 Visit OT Treatments $Self Care/Home Management : 8-22 mins  Alfonse Flavors, OTA Acute Rehabilitation Services  Office [phone]   Dewain Penning 03/21/2022, 1:10 PM

## 2022-03-21 NOTE — Plan of Care (Signed)
  Problem: Education: Goal: Knowledge of General Education information will improve Description Including pain rating scale, medication(s)/side effects and non-pharmacologic comfort measures Outcome: Progressing   Problem: Health Behavior/Discharge Planning: Goal: Ability to manage health-related needs will improve Outcome: Progressing   

## 2022-03-21 NOTE — Care Management Important Message (Signed)
Important Message  Patient Details  Name: Mark [name] MRN: [id number] Date of Birth: 1950/10/26   Medicare Important Message Given:  Yes     Renie Ora 03/21/2022, 10:45 AM

## 2022-03-21 NOTE — Progress Notes (Signed)
PROGRESS NOTE    Mark [name]  KWI:[id number] DOB: [birth year] DOA: 03/09/2022 PCP: Hillery Aldo, NP    Brief Narrative:   Patient 71 year old male with past medical history of asthma, alcohol dependence, hypertension, BPH, allergic rhinitis presented to the ED with 4-day history of productive cough, brownish sputum, acute respiratory distress requiring BiPAP.  Patient noted on work-up to be septic with concern for right upper lobe pneumonia, and also had rapid atrial fibrillation with RVR on 03/10/2022.  Patient placed on Cardizem drip and subsequently transition to oral Cardizem and metoprolol.  PCCM and cardiology followed the patient during hospitalization patient has subsequently improved..    At this time, clinically remained stable and has been weaned off of oxygen and BiPAP at this time.  Mentation has improved.  Awaiting for skilled nursing facility, pending insurance authorization.  Assessment & Plan:   Principal Problem:   Acute respiratory failure with hypoxia (HCC) Active Problems:   Sepsis due to pneumonia (HCC)   Asthma exacerbation   Transaminitis   Elevated troponin   EtOH dependence (HCC)   Tobacco abuse   PNA (pneumonia)   ARF (acute renal failure) (HCC)   Dehydration   Abnormal TSH   Chronic alcohol abuse   Severe persistent asthma with acute exacerbation   AKI (acute kidney injury) (HCC)   Severe sepsis (HCC)   Atrial fibrillation with RVR (HCC)   Hypervolemia   COPD with acute exacerbation (HCC)  Acute respiratory failure with hypoxia . Improved.  Off oxygen at this time.  Likely multifactorial secondary to right upper lobe pneumonia in the setting of an acute asthma /COPD exacerbation and concern for volume overload/CHF exacerbation.  Initially had received  CPAP, Solu-Medrol, BiPAP.    Patient has completed course of antibiotic for pneumonia.  Currently on oral Lasix. Continue DuoNebs and supportive care.  On prednisone p.o continue to taper down. Will  change to 20 mg from 30 today.  Sepsis secondary to pneumonia,  Sepsis physiology has resolved.  Completed course of antibiotic.    New onset atrial fibrillation  Controlled. Due to abnormal TSH amiodarone was not started.  2D echo with EF of 50 to 55%. Cardiology has recommended to continue Cardizem 360 daily, Toprol 100 mg daily and Eliquis 5 mg twice a day.  Cardiology recommends follow-up with Dr. Anne Fu in 1 to 2 weeks.  Volume overload/acute diastolic CHF exacerbation Has improved.  Continue oral Lasix, Jardiance.  Patient is negative balance for 24268 ml  Volume status continues to improve.  Have reduced the dose of Lasix to 20 mg from 40 mg daily for now due to large diuresis  Elevated troponin -Likely demand ischemia.  Cardiology followed the patient during hospitalization.  2D echocardiogram with LV ejection fraction of 50 to 55% with no regional wall motion abnormalities.  Continue metoprolol and Eliquis.  Essential hypertension On Cardizem and metoprolol at this time.  Blood pressure seems to be stable.   History of alcohol use, possible mild cognitive dysfunction No signs of withdrawal.  Continue thiamine, multivitamin, folic acid.  Mentation has significantly improved.  Patient is fully oriented.  Tobacco dependence -Continue nicotine patch.  Elevated LFT. Noted alcohol use.  Latest AST of 36 and ALT of 76.   Acute renal failure Improved.  Latest creatinine at 0.9.  Was on ARB and HCTZ as outpatient which is currently on hold.  On Lasix orally.   Abnormal TSH -TSH noted at 0.195.  Free T4 of 1.39.  Free T3 at 1.6. Total  T3 at 79. Thyroid-stimulating immunoglobin less than 0.10.  Will need outpatient PCP follow-up for this.  Anemia/folate deficiency Hemoglobin today at 12.1.    Folate level at 4.7 continue folic acid.  MCV elevated.  Generalized deconditioning, debility.  PT has recommended the skilled nursing facility at this time.  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  Disposition: SNF, medically stable for disposition when bed is found.  Status is: Inpatient Remains inpatient appropriate because: Awaiting for placement to skilled nursing facility, insurance authorization pending..   Consultants:  Cardiology PCCM:   Procedures:   PICC line 03/10/2022  Antimicrobials:  IV Rocephin 03/09/2022>>>>> IV azithromycin 03/09/2022   Subjective: Today, patient was seen and examined at bedside.  Denies any interval complaints.  Denies any nausea vomiting fever chills.  Denies any dizziness lightheadedness shortness of breath chest pain.  Objective: Vitals:   03/20/22 1931 03/21/22 0340 03/21/22 0802 03/21/22 1108  BP: 113/65 133/76 110/66 127/71  Pulse: 76 63 74 83  Resp: 18 18 (!) 23 18  Temp: 98 F (36.7 C) (!) 97.3 F (36.3 C) 97.6 F (36.4 C)   TempSrc: Oral Oral Oral Oral  SpO2: 95% 95% 92% 96%  Weight:  74.6 kg    Height:        Intake/Output Summary (Last 24 hours) at 03/21/2022 1158 Last data filed at 03/21/2022 1054 Gross per 24 hour  Intake 720 ml  Output 2900 ml  Net -2180 ml    Filed Weights   03/20/22 0130 03/20/22 0523 03/21/22 0340  Weight: 76.6 kg 75.7 kg 74.6 kg    Physical examination: Body mass index is 20.56 kg/m.   General:  Average built, not in obvious distress HENT:   No scleral pallor or icterus noted. Oral mucosa is moist.  Chest:  Clear breath sounds.  No crackles or wheezes.  CVS: S1 &S2 heard. No murmur.  Regular rate and rhythm. Abdomen: Soft, nontender, nondistended.  Bowel sounds are heard.   Extremities: No cyanosis, clubbing with bilateral lower extremity edema.  Peripheral pulses are palpable. Psych: Alert, awake and oriented, normal mood CNS:  No cranial nerve deficits.  Generalized weakness noted. Skin: Warm and dry.   No rashes noted.  Data Reviewed:  I have personally reviewed the following labs and imaging studies.    CBC: Recent Labs  Lab 03/15/22 0341 03/16/22 0401 03/17/22 0445 03/18/22 0523 03/19/22 0459 03/20/22 0525 03/21/22 0456  WBC 21.8* 22.7* 27.0* 25.5* 27.3* 23.6* 19.2*  NEUTROABS 20.1* 19.7* 24.8*  --   --   --   --   HGB 10.8* 11.8* 13.2 12.3* 12.2* 12.6* 12.1*  HCT 33.6* 36.0* 40.6 36.8* 35.9* 37.8* 37.1*  MCV 103.4* 102.3* 101.5* 101.4* 99.4 100.3* 101.6*  PLT 456* 488* 551* 480* 447* 459* 374    Basic Metabolic Panel: Recent Labs  Lab 03/17/22 0445 03/18/22 0523 03/19/22 0459 03/20/22 0525 03/21/22 0456  NA 136 138 137 137 135  K 3.9 3.8 4.5 4.0 3.8  CL 100 103 103 102 97*  CO2 27 28 28 28 30   GLUCOSE 133* 130* 121* 124* 85  BUN 27* 24* 21 22 19   CREATININE 0.82 0.85 0.98 0.97 0.95  CALCIUM 9.1 9.0 8.9 8.8* 8.5*  MG 2.2 2.1 2.0 1.9 1.9    GFR: Estimated Creatinine Clearance:  77.4 mL/min (by C-G formula based on SCr of 0.95 mg/dL).  Liver Function Tests: Recent Labs  Lab 03/17/22 0445 03/19/22 0459  AST 36 36  ALT 60* 76*  ALKPHOS 84 74  BILITOT 0.5 0.7  PROT 6.5 5.6*  ALBUMIN 2.1* 2.0*    CBG: Recent Labs  Lab 03/20/22 1931 03/20/22 2343 03/21/22 0411 03/21/22 0810 03/21/22 1105  GLUCAP 204* 111* 75 114* 125*      No results found for this or any previous visit (from the past 240 hour(s)).    Radiology Studies: No results found.   Scheduled Meds:  alteplase  2 mg Intracatheter Once   apixaban  5 mg Oral BID   budesonide (PULMICORT) nebulizer solution  0.5 mg Nebulization BID   Chlorhexidine Gluconate Cloth  6 each Topical Daily   diltiazem  360 mg Oral Daily   empagliflozin  10 mg Oral Daily   folic acid  1 mg Oral Daily   furosemide  20 mg Oral Daily   guaiFENesin  1,200 mg Oral BID   insulin aspart  0-15 Units Subcutaneous Q4H   loratadine  10 mg Oral Daily   metoprolol succinate  100 mg Oral Daily   multivitamin with  minerals  1 tablet Oral Daily   nicotine  7 mg Transdermal Daily   pantoprazole  40 mg Oral BID AC   predniSONE  30 mg Oral Q breakfast   sodium chloride flush  10-40 mL Intracatheter Q12H   sodium chloride flush  3 mL Intravenous Q12H   thiamine  100 mg Oral Daily   Continuous Infusions:    LOS: 12 days   Flora Lipps, MD [facility] 03/21/2022, 11:58 AM

## 2022-03-22 DIAGNOSIS — J9601 Acute respiratory failure with hypoxia: Secondary | ICD-10-CM | POA: Diagnosis not present

## 2022-03-22 LAB — BASIC METABOLIC PANEL
Anion gap: 11 (ref 5–15)
BUN: 16 mg/dL (ref 8–23)
CO2: 24 mmol/L (ref 22–32)
Calcium: 8.4 mg/dL — ABNORMAL LOW (ref 8.9–10.3)
Chloride: 102 mmol/L (ref 98–111)
Creatinine, Ser: 1.22 mg/dL (ref 0.61–1.24)
GFR, Estimated: >60 mL/min (ref >60)
Glucose, Bld: 104 mg/dL — ABNORMAL HIGH (ref 70–99)
Potassium: 4.3 mmol/L (ref 3.5–5.1)
Sodium: 137 mmol/L (ref 135–145)

## 2022-03-22 LAB — CBC
HCT: 32.4 % — ABNORMAL LOW (ref 39.0–52.0)
Hemoglobin: 11.2 g/dL — ABNORMAL LOW (ref 13.0–17.0)
MCH: 34.9 pg — ABNORMAL HIGH (ref 26.0–34.0)
MCHC: 34.6 g/dL (ref 30.0–36.0)
MCV: 100.9 fL — ABNORMAL HIGH (ref 80.0–100.0)
Platelets: 343 10*3/uL (ref 150–400)
RBC: 3.21 MIL/uL — ABNORMAL LOW (ref 4.22–5.81)
RDW: 13 % (ref 11.5–15.5)
WBC: 16.6 10*3/uL — ABNORMAL HIGH (ref 4.0–10.5)
nRBC: 0 % (ref 0.0–0.2)

## 2022-03-22 LAB — GLUCOSE, CAPILLARY
Glucose-Capillary: 101 mg/dL — ABNORMAL HIGH (ref 70–99)
Glucose-Capillary: 106 mg/dL — ABNORMAL HIGH (ref 70–99)
Glucose-Capillary: 134 mg/dL — ABNORMAL HIGH (ref 70–99)
Glucose-Capillary: 117 mg/dL — ABNORMAL HIGH (ref 70–99)
Glucose-Capillary: 172 mg/dL — ABNORMAL HIGH (ref 70–99)

## 2022-03-22 LAB — MAGNESIUM: Magnesium: 2 mg/dL (ref 1.7–2.4)

## 2022-03-22 NOTE — Progress Notes (Signed)
PT Cancellation Note  Patient Details Name: Mark [name] MRN: [id number] DOB: Jan 05, 1951   Cancelled Treatment:    Reason Eval/Treat Not Completed: Patient declined, no reason specified. Pt adamantly refusing stating he walked two times yesterday. Explained to pt that he needs to continue to mobilize to incr his strength. Pt continued to refuse.   Angelina Ok [facility] 03/22/2022, 11:08 AM Skip Mayer PT Acute [employer] [phone]

## 2022-03-22 NOTE — Progress Notes (Signed)
PROGRESS NOTE    Mark [name]  WJX:[id number] DOB: [birth year] DOA: 03/09/2022 PCP: Hillery Aldo, NP    Brief Narrative:   Patient 71 year old male with past medical history of asthma, alcohol dependence, hypertension, BPH, allergic rhinitis presented to the ED with 4-day history of productive cough, brownish sputum, acute respiratory distress requiring BiPAP.  Patient noted on work-up to be septic with concern for right upper lobe pneumonia, and also had rapid atrial fibrillation with RVR on 03/10/2022.  Patient placed on Cardizem drip and subsequently transition to oral Cardizem and metoprolol.  PCCM and cardiology followed the patient during hospitalization patient has subsequently improved..    At this time, clinically remained stable and has been weaned off of oxygen and BiPAP at this time.  Mentation has improved.  Awaiting for skilled nursing facility, pending insurance authorization.  03/22/22: The patient was seen and examined at his bedside.  He is alert and oriented x1.  States he does not want to go to SNF, he wants to go home.  His son is in the [employer] and may return home on Sunday.  Assessment & Plan:   Principal Problem:   Acute respiratory failure with hypoxia (HCC) Active Problems:   Sepsis due to pneumonia (HCC)   Asthma exacerbation   Transaminitis   Elevated troponin   EtOH dependence (HCC)   Tobacco abuse   PNA (pneumonia)   ARF (acute renal failure) (HCC)   Dehydration   Abnormal TSH   Chronic alcohol abuse   Severe persistent asthma with acute exacerbation   AKI (acute kidney injury) (HCC)   Severe sepsis (HCC)   Atrial fibrillation with RVR (HCC)   Hypervolemia   COPD with acute exacerbation (HCC)  Resolved acute respiratory failure with hypoxia . Off oxygen at this time.  Likely multifactorial secondary to right upper lobe pneumonia in the setting of an acute asthma /COPD exacerbation and concern for volume overload/CHF exacerbation.  Initially had  received  CPAP, Solu-Medrol, BiPAP.    Patient has completed course of antibiotic for pneumonia.  Currently on oral Lasix. Continue DuoNebs and supportive care.  On prednisone p.o continue to taper down. Will change to 20 mg from 30 today.  Resolved sepsis secondary to pneumonia,  Sepsis physiology has resolved.  Completed course of antibiotic.    New onset atrial fibrillation, rate controlled. Due to abnormal TSH, amiodarone was not started.  2D echo with EF of 50 to 55%. Cardiology has recommended to continue Cardizem 360 daily, Toprol 100 mg daily and Eliquis 5 mg twice a day.  Cardiology recommends follow-up with Dr. Anne Fu in 1 to 2 weeks.  Volume overload/acute diastolic CHF exacerbation Has improved.  Continue oral Lasix, Jardiance.   Net I&O -12.7 L.    Elevated troponin -Likely demand ischemia.  Cardiology followed the patient during hospitalization.  2D echocardiogram with LV ejection fraction of 50 to 55% with no regional wall motion abnormalities.  Continue metoprolol and Eliquis. Troponin down trended Denies any anginal symptoms at the time of the visit.  Essential hypertension BP is at goal 119/57. On Cardizem and metoprolol at this time.   Continue to monitor vital signs.  History of alcohol use, possible mild cognitive dysfunction No signs of withdrawal.  Continue thiamine, multivitamin, folic acid.   The patient lives alone.  He declines SNF placement.  Tobacco dependence -Continue nicotine patch.  Elevated LFT. Noted alcohol use.  Avoid hepatotoxic agents.   Resolved acute renal failure Was on ARB and HCTZ as outpatient which  is currently on hold.   On Lasix orally. Continue to monitor.   Abnormal TSH -TSH noted at 0.195.  Free T4 of 1.39.  Free T3 at 1.6. Total T3 at 79. Thyroid-stimulating immunoglobin less than 0.10.  Will need outpatient PCP follow-up for this.  Anemia/folate deficiency Hemoglobin today at 12.1.    Folate level at 4.7 continue folic  acid.  MCV elevated.  Generalized deconditioning, debility.  PT has recommended the skilled nursing facility at this time.  The patient declines SNF placement.  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  Disposition: Unknown at this time.  The patient declines SNF.  Lives alone-high fall risk.   Consultants:  Cardiology PCCM:   Procedures:   PICC line 03/10/2022  Antimicrobials:  IV Rocephin 03/09/2022>>>>> IV azithromycin 03/09/2022   Subjective: Today, patient was seen and examined at bedside.  Denies any interval complaints.  Denies any nausea vomiting fever chills.  Denies any dizziness lightheadedness shortness of breath chest pain.  Objective: Vitals:   03/22/22 0210 03/22/22 0300 03/22/22 0726 03/22/22 1112  BP:  121/69 128/75 (!) 119/57  Pulse:  69 82 86  Resp:  (!) 22 19 19   Temp:  98 F (36.7 C) (!) 97.3 F (36.3 C)   TempSrc:  Oral Oral   SpO2:  96% 90% 96%  Weight: 74.4 kg     Height:        Intake/Output Summary (Last 24 hours) at 03/22/2022 1302 Last data filed at 03/22/2022 0827 Gross per 24 hour  Intake 938 ml  Output 2000 ml  Net -1062 ml   Filed Weights   03/20/22 0523 03/21/22 0340 03/22/22 0210  Weight: 75.7 kg 74.6 kg 74.4 kg    Physical examination: Body mass index is 20.5 kg/m.   General: Well-developed well-nourished in no acute distress.  He is alert and oriented x1 HENT:   No scleral pallor or icterus noted. Oral mucosa is moist.  Chest:  Clear breath sounds.  No crackles or wheezes.  CVS: Irregular rate and rhythm no rubs or gallops. Abdomen: Soft, nontender, nondistended.  Bowel sounds are heard.   Extremities: Trace lower extremity edema bilaterally.  Peripheral pulses are palpable. Psych: Mood is appropriate for condition and setting. CNS:  No cranial nerve  deficits.  Generalized weakness noted. Skin: No rashes noted.  Data Reviewed:  I have personally reviewed the following labs and imaging studies.    CBC: Recent Labs  Lab 03/16/22 0401 03/17/22 0445 03/18/22 0523 03/19/22 0459 03/20/22 0525 03/21/22 0456 03/22/22 0341  WBC 22.7* 27.0* 25.5* 27.3* 23.6* 19.2* 16.6*  NEUTROABS 19.7* 24.8*  --   --   --   --   --   HGB 11.8* 13.2 12.3* 12.2* 12.6* 12.1* 11.2*  HCT 36.0* 40.6 36.8* 35.9* 37.8* 37.1* 32.4*  MCV 102.3* 101.5* 101.4* 99.4 100.3* 101.6* 100.9*  PLT 488* 551* 480* 447* 459* 374 343   Basic Metabolic Panel: Recent Labs  Lab 03/18/22 0523 03/19/22 0459 03/20/22 0525 03/21/22 0456 03/22/22 0341  NA 138 137 137 135 137  K 3.8 4.5 4.0 3.8 4.3  CL 103 103 102 97* 102  CO2 28 28 28 30 24   GLUCOSE 130* 121* 124* 85 104*  BUN 24* 21 22 19 16   CREATININE 0.85 0.98 0.97 0.95 1.22  CALCIUM 9.0 8.9 8.8* 8.5* 8.4*  MG 2.1 2.0 1.9 1.9 2.0   GFR: Estimated Creatinine Clearance: 60.1 mL/min (by C-G formula based on SCr of 1.22 mg/dL).  Liver Function Tests: Recent Labs  Lab 03/17/22 0445 03/19/22 0459  AST 36 36  ALT 60* 76*  ALKPHOS 84 74  BILITOT 0.5 0.7  PROT 6.5 5.6*  ALBUMIN 2.1* 2.0*   CBG: Recent Labs  Lab 03/21/22 1639 03/21/22 2002 03/22/22 0603 03/22/22 0810 03/22/22 1109  GLUCAP 192* 222* 101* 106* 134*     No results found for this or any previous visit (from the past 240 hour(s)).    Radiology Studies: No results found.   Scheduled Meds:  alteplase  2 mg Intracatheter Once   apixaban  5 mg Oral BID   budesonide (PULMICORT) nebulizer solution  0.5 mg Nebulization BID   Chlorhexidine Gluconate Cloth  6 each Topical Daily   diltiazem  360 mg Oral Daily   empagliflozin  10 mg Oral Daily   folic acid  1 mg Oral Daily   furosemide  20 mg Oral Daily   guaiFENesin  1,200 mg Oral BID   insulin aspart  0-15 Units Subcutaneous TID AC & HS   loratadine  10 mg Oral Daily   metoprolol  succinate  100 mg Oral Daily   multivitamin with minerals  1 tablet Oral Daily   nicotine  7 mg Transdermal Daily   pantoprazole  40 mg Oral BID AC   predniSONE  20 mg Oral Q breakfast   sodium chloride flush  3 mL Intravenous Q12H   thiamine  100 mg Oral Daily   Continuous Infusions:    LOS: 13 days   Kayleen Memos, MD [facility] 03/22/2022, 1:02 PM

## 2022-03-22 NOTE — TOC Progression Note (Addendum)
Transition of Care [facility]) - Progression Note    Patient Details  Name: Mark [name] MRN: [id number] Date of Birth: 02/09/1951  Transition of Care [facility] CM/SW [place], [place] Phone Number: 03/22/2022, 1:04 PM  Clinical Narrative:     CSW called [employer] and is informed pt's [employer] Medicare is not in network with [employer] and pt has no out of network SNF coverage. Only one facility that offered bed for pt is in network; [facility]. CSW attempted to call son to update and confirm he would like to pursue [facility]; no answer, left message requesting return call.   CSW met with pt and explained this situation. Pt states he is not going to SNF. He states he talked to his son who is returning from out of town and that his son plans on taking him home. Pt states that he lives alone but that son is nearby and comes over about everyday. RN and MD notified. Awaiting return call from pt's son.   1450: Received call back from pt's son. Explained auth situation and that [facility] is only other in network SNF that offered bed. Son states he is going to discuss with his mother and will call CSW back.   1605: Pt's son called CSW back and explained that he spoke with family and they do not want him to go to [facility]. They will opt for pt returning home. Family is working on plan for pt to return home and indicated that son's sister may be assisting pt once at home. CSW explained HH; son has no preference on [facility] agency.   Expected Discharge Plan: Skilled Nursing Facility Barriers to Discharge: [employer]  Expected Discharge Plan and Services Expected Discharge Plan: [place] In-house Referral: Clinical Social Work Discharge Planning Services: CM Consult Post Acute Care Choice: [place], [place] Living arrangements for the past 2 months: Single Family Home                                       Social Determinants of  Health (SDOH) Interventions    Readmission Risk Interventions     No data to display

## 2022-03-22 NOTE — Plan of Care (Signed)
  Problem: Clinical Measurements: Goal: Diagnostic test results will improve Outcome: Progressing Goal: Respiratory complications will improve Outcome: Progressing   Problem: Activity: Goal: Risk for activity intolerance will decrease Outcome: Progressing   Problem: Safety: Goal: Ability to remain free from injury will improve Outcome: Progressing   

## 2022-03-23 DIAGNOSIS — J9601 Acute respiratory failure with hypoxia: Secondary | ICD-10-CM | POA: Diagnosis not present

## 2022-03-23 LAB — GLUCOSE, CAPILLARY
Glucose-Capillary: 92 mg/dL (ref 70–99)
Glucose-Capillary: 102 mg/dL — ABNORMAL HIGH (ref 70–99)
Glucose-Capillary: 118 mg/dL — ABNORMAL HIGH (ref 70–99)

## 2022-03-23 MED ORDER — DILTIAZEM HCL ER COATED BEADS 360 MG PO CP24: 360.0000 mg | ORAL_CAPSULE | Freq: Every day | ORAL | 0 refills | Status: DC

## 2022-03-23 MED ORDER — VITAMIN B-1 100 MG PO TABS: 100.0000 mg | ORAL_TABLET | Freq: Every day | ORAL | 0 refills | Status: AC

## 2022-03-23 MED ORDER — FOLIC ACID 1 MG PO TABS: 1.0000 mg | ORAL_TABLET | Freq: Every day | ORAL | 0 refills | Status: AC

## 2022-03-23 MED ORDER — FUROSEMIDE 20 MG PO TABS: 20.0000 mg | ORAL_TABLET | Freq: Every day | ORAL | 0 refills | Status: DC

## 2022-03-23 MED ORDER — NICOTINE 7 MG/24HR TD PT24: 7.0000 mg | MEDICATED_PATCH | Freq: Every day | TRANSDERMAL | 0 refills | Status: DC

## 2022-03-23 MED ORDER — METOPROLOL SUCCINATE ER 100 MG PO TB24: 100.0000 mg | ORAL_TABLET | Freq: Every day | ORAL | 0 refills | Status: DC

## 2022-03-23 MED ORDER — APIXABAN 5 MG PO TABS: 5.0000 mg | ORAL_TABLET | Freq: Two times a day (BID) | ORAL | 0 refills | Status: DC

## 2022-03-23 MED ORDER — PREDNISONE 10 MG PO TABS: 20.0000 mg | ORAL_TABLET | Freq: Every day | ORAL | 0 refills | Status: AC

## 2022-03-23 NOTE — Discharge Summary (Signed)
Discharge Summary  Mark [name] [id number] DOB: 12/29/1950  PCP: Cipriano Mile, NP  Admit date: 03/09/2022 Discharge date: 03/23/2022  Time spent: 35 minutes.  Recommendations for Outpatient Follow-up:  Follow-up with your primary care provider. Follow-up with cardiology in 1 to 2 weeks. Take your medications as prescribed. Continue PT OT with assistance and fall precautions.  Discharge Diagnoses:  Active Hospital Problems   Diagnosis Date Noted   Acute respiratory failure with hypoxia ([place]) 03/09/2022   Sepsis due to pneumonia ([place]) 03/09/2022    Priority: High   Asthma exacerbation 11/10/2019    Priority: Medium    COPD with acute exacerbation (HCC)    Hypervolemia    Atrial fibrillation with RVR (HCC) 03/10/2022   Elevated troponin 03/09/2022   EtOH dependence ([place]) 03/09/2022   Tobacco abuse 03/09/2022   PNA (pneumonia) 03/09/2022   ARF (acute renal failure) ([place]) 03/09/2022   Dehydration 03/09/2022   Abnormal TSH 03/09/2022   Chronic alcohol abuse    Severe persistent asthma with acute exacerbation    AKI (acute kidney injury) ([place])    Severe sepsis ([place])    Transaminitis 11/10/2019    Resolved Hospital Problems  No resolved problems to display.    Discharge Condition: Stable  Diet recommendation: Resume previous diet.  Vitals:   03/23/22 0533 03/23/22 0723  BP: (!) 118/57 (!) 120/46  Pulse:  74  Resp: 20 20  Temp: 98.7 F (37.1 C) (!) 97.5 F (36.4 C)  SpO2: 90% 94%    History of present illness:  Patient 71 year old male with past medical history of asthma, alcohol dependence, hypertension, BPH, allergic rhinitis presented to the ED with 4-day history of productive cough, brownish sputum, acute respiratory distress requiring BiPAP.  Work-up revealed sepsis with concern for right upper lobe pneumonia, asthma/COPD exacerbation.  Additionally, the patient had rapid atrial fibrillation with RVR on 03/10/2022.  Placed on Cardizem drip and subsequently  transitioned to oral Cardizem and metoprolol.  Seen by PCCM and cardiology.     At this time, clinically remained stable and has been weaned off of oxygen and BiPAP at this time.  Mentation has improved and is back to baseline.  03/23/22: The patient was seen and examined at his bedside.  He is alert and interactive.  He declines SNF and wants to go home.     Hospital Course:  Principal Problem:   Acute respiratory failure with hypoxia (HCC) Active Problems:   Sepsis due to pneumonia ([place])   Asthma exacerbation   Transaminitis   Elevated troponin   EtOH dependence (HCC)   Tobacco abuse   PNA (pneumonia)   ARF (acute renal failure) (HCC)   Dehydration   Abnormal TSH   Chronic alcohol abuse   Severe persistent asthma with acute exacerbation   AKI (acute kidney injury) ([place])   Severe sepsis (HCC)   Atrial fibrillation with RVR (HCC)   Hypervolemia   COPD with acute exacerbation (HCC)  Resolved acute respiratory failure with hypoxia . Off oxygen at this time.  Likely multifactorial secondary to right upper lobe pneumonia in the setting of an acute asthma /COPD exacerbation and concern for volume overload/CHF exacerbation.   Completed course of antibiotic for pneumonia.   Currently on oral Lasix.  Continue home bronchodilators Continue to taper off prednisone as recommended by pulmonary.   Resolved sepsis secondary to pneumonia,  Sepsis physiology has resolved.  Completed course of antibiotic.     New onset atrial fibrillation, rate controlled. Due to abnormal TSH, amiodarone was  not started.  2D echo with EF of 50 to 55%. Cardiology has recommended to continue Cardizem 360 daily, Toprol 100 mg daily and Eliquis 5 mg twice a day.  Cardiology recommends follow-up with Dr. Anne Fu in 1 to 2 weeks.   Volume overload/acute diastolic CHF exacerbation Has improved.  Continue oral Lasix, Jardiance.   Net I&O -14.4 L   Elevated troponin, likely demand ischemia Cardiology followed the  patient during hospitalization.  2D echocardiogram with LV ejection fraction of 50 to 55% with no regional wall motion abnormalities.   Continue metoprolol and Eliquis. Troponin down trended Denies any anginal symptoms at the time of the visit.   Essential hypertension BP is at goal 119/57. On Cardizem and metoprolol.   Continue to monitor vital signs.   History of alcohol use, possible mild cognitive dysfunction No signs of withdrawal.  Continue thiamine, multivitamin, folic acid.   The patient lives alone.  He declines SNF placement. Alcohol cessation counseling was provided   Tobacco dependence -Continue nicotine patch.   Elevated LFT. Noted history of alcohol use.  Continue to avoid hepatotoxic agents.   Resolved acute renal failure Continue to avoid nephrotoxic agents, dehydration and hypotension. Follow-up with your primary care provider   Abnormal TSH -TSH noted at 0.195.  Free T4 of 1.39.  Free T3 at 1.6. Total T3 at 79. Thyroid-stimulating immunoglobin less than 0.10.  Will need outpatient PCP follow-up for this.   Anemia/folate deficiency Hemoglobin today at 12.1.    Folate level at 4.7 continue folic acid.  MCV elevated.   Generalized deconditioning, debility.  PT has recommended the skilled nursing facility at this time.  The patient declines SNF placement.   DVT prophylaxis: Eliquis   Code Status: Full code   Family Communication: None at bedside.     Discharge Exam: BP (!) 120/46 (BP Location: Right Wrist)   Pulse 74   Temp (!) 97.5 F (36.4 C) (Oral)   Resp 20   Ht 6\' 3"  (1.905 m)   Wt 72 kg   SpO2 94%   BMI 19.84 kg/m  General: 71 y.o. year-old male well developed well nourished in no acute distress.  Alert and interactive. Cardiovascular: Irregular rate and rhythm with no rubs or gallops.  No thyromegaly or JVD noted.   Respiratory: Clear to auscultation with no wheezes or rales. Good inspiratory effort. Abdomen: Soft nontender nondistended  with normal bowel sounds x4 quadrants. Musculoskeletal: No lower extremity edema. 2/4 pulses in all 4 extremities. Skin: No ulcerative lesions noted or rashes, Psychiatry: Mood is appropriate for condition and setting  Discharge Instructions You were cared for by a hospitalist during your hospital stay. If you have any questions about your discharge medications or the care you received while you were in the hospital after you are discharged, you can call the unit and asked to speak with the hospitalist on call if the hospitalist that took care of you is not available. Once you are discharged, your primary care physician will handle any further medical issues. Please note that NO REFILLS for any discharge medications will be authorized once you are discharged, as it is imperative that you return to your primary care physician (or establish a relationship with a primary care physician if you do not have one) for your aftercare needs so that they can reassess your need for medications and monitor your lab values.   Allergies as of 03/23/2022   No Known Allergies      Medication List  STOP taking these medications    amLODipine 10 MG tablet Commonly known as: NORVASC   azithromycin 250 MG tablet Commonly known as: Zithromax Z-Pak   losartan 25 MG tablet Commonly known as: COZAAR   meclizine 25 MG tablet Commonly known as: ANTIVERT   traZODone 100 MG tablet Commonly known as: DESYREL       TAKE these medications    albuterol 108 (90 Base) MCG/ACT inhaler Commonly known as: VENTOLIN HFA Inhale 2 puffs into the lungs every 4 (four) hours as needed for wheezing or shortness of breath.   apixaban 5 MG Tabs tablet Commonly known as: ELIQUIS Take 1 tablet (5 mg total) by mouth 2 (two) times daily.   atorvastatin 20 MG tablet Commonly known as: LIPITOR Take 20 mg by mouth daily.   diltiazem 360 MG 24 hr capsule Commonly known as: CARDIZEM CD Take 1 capsule (360 mg total) by  mouth daily. Start taking on: September 16, [id number]   folic acid 1 MG tablet Commonly known as: FOLVITE Take 1 tablet (1 mg total) by mouth daily. Start taking on: March 24, 2022   furosemide 20 MG tablet Commonly known as: LASIX Take 1 tablet (20 mg total) by mouth daily. Start taking on: March 24, 2022   hydrOXYzine 10 MG tablet Commonly known as: ATARAX Take 10-20 mg by mouth at bedtime.   metoprolol succinate 100 MG 24 hr tablet Commonly known as: TOPROL-XL Take 1 tablet (100 mg total) by mouth daily. Take with or immediately following a meal. Start taking on: March 24, 2022   multivitamin with minerals Tabs tablet Take 1 tablet by mouth daily.   nicotine 7 mg/24hr patch Commonly known as: NICODERM CQ - dosed in mg/24 hr Place 1 patch (7 mg total) onto the skin daily. Start taking on: March 24, 2022   omeprazole 20 MG capsule Commonly known as: PRILOSEC Take 1 capsule (20 mg total) by mouth daily. What changed:  when to take this reasons to take this   predniSONE 10 MG tablet Commonly known as: DELTASONE Take 2 tablets (20 mg total) by mouth daily with breakfast for 3 days. Start taking on: March 24, 2022   Symbicort 160-4.5 MCG/ACT inhaler Generic drug: budesonide-formoterol Inhale 2 puffs into the lungs daily.   thiamine 100 MG tablet Commonly known as: Vitamin B-1 Take 1 tablet (100 mg total) by mouth daily. Start taking on: March 24, 2022   Trelegy Ellipta 200-62.5-25 MCG/ACT Aepb Generic drug: Fluticasone-Umeclidin-Vilant Inhale 1 puff into the lungs daily.               Durable Medical Equipment  (From admission, onward)           Start     Ordered   03/10/22 1445  For home use only DME 4 wheeled rolling walker with seat  Once       Question:  Patient needs a walker to treat with the following condition  Answer:  Debility   03/10/22 1444           No Known Allergies  Contact information for follow-up  providers     Swinyer, Lanice Schwab, NP Follow up.   Specialty: Nurse Practitioner Why: Hospital f/u with Cardiology scheduled for 03/26/2022 at 8:25am. Please arrive 15 minutes early for check-in. If this date/time does not work for you, please call our office to reschedule. Contact information: [address] [place] [address] [phone]         Care,  [facility] Follow up.   Specialty: Home Health Services Why: Agency will contact you with apt times Contact information: [address] [place] [address] [phone]         Hillery Aldo, NP. Go on 04/06/2022.   Why: @12 :40pm Contact information: [address] [place] [phone]              Contact information for after-discharge care     Destination     [facility] Preferred SNF .   Service: Skilled Nursing Contact information: [address] [place] [phone]                      The results of significant diagnostics from this hospitalization (including imaging, microbiology, ancillary and laboratory) are listed below for reference.    Significant Diagnostic Studies: DG CHEST PORT 1 VIEW  Result Date: 03/12/2022 CLINICAL DATA:  Pneumonia EXAM: PORTABLE CHEST 1 VIEW COMPARISON:  Previous studies including the examination of 03/10/2022 FINDINGS: Cardiac size is within normal limits. There is large infiltrate in right upper lobe. There is some decrease in opacity in the infiltrate. However, area of infiltrate has increased. There is new infiltrate in left lower lung field and retrocardiac region suggesting pneumonia in left lower lobe. Rest of the lung fields are clear. There is no significant pleural effusion or pneumothorax. IMPRESSION: Large infiltrate is seen in right upper lobe with increasing area of pneumonia. Infiltrate in the right upper lobe appears less dense. There is new infiltrate in left  lower lung fields suggesting new focus of pneumonia in left lower lobe. Electronically Signed   By: 05/10/2022 M.D.   On: 03/12/2022 12:51   ECHOCARDIOGRAM COMPLETE  Result Date: 03/11/2022    ECHOCARDIOGRAM REPORT   Patient Name:   Mark [name] Date of Exam: 03/11/2022 Medical Rec #:  05/11/2022       Height:       74.0 in Accession #:    [id number]      Weight:       174.4 lb Date of Birth:  [birth year]       BSA:          2.050 m Patient Age:    82 years        BP:           131/81 mmHg Patient Gender: M               HR:           117 bpm. Exam Location:  Inpatient Procedure: 2D Echo, Cardiac Doppler, Color Doppler and Intracardiac            Opacification Agent Indications:    Elevated troponin  History:        Patient has no prior history of Echocardiogram examinations.                 Signs/Symptoms:Shortness of Breath. Atrial fibrillation with                 RVR.  Sonographer:    78 RDCS Referring Phys: Roosvelt Maser, V IMPRESSIONS  1. Abnormal septal motion . Left ventricular ejection fraction, by estimation, is 50 to 55%. The left ventricle has low normal function. The left ventricle has no regional wall motion abnormalities. Left ventricular diastolic parameters are indeterminate.  2. Right ventricular systolic function is moderately reduced. The right ventricular size is moderately enlarged.  3.  The mitral valve is abnormal. Trivial mitral valve regurgitation. No evidence of mitral stenosis.  4. The aortic valve is tricuspid. There is mild calcification of the aortic valve. There is mild thickening of the aortic valve. Aortic valve regurgitation is not visualized. Aortic valve sclerosis is present, with no evidence of aortic valve stenosis.  5. The inferior vena cava is dilated in size with >50% respiratory variability, suggesting right atrial pressure of 8 mmHg. FINDINGS  Left Ventricle: Abnormal septal motion. Left ventricular ejection fraction, by estimation, is 50 to 55%. The  left ventricle has low normal function. The left ventricle has no regional wall motion abnormalities. Definity contrast agent was given IV to delineate the left ventricular endocardial borders. The left ventricular internal cavity size was normal in size. There is no left ventricular hypertrophy. Left ventricular diastolic parameters are indeterminate. Right Ventricle: The right ventricular size is moderately enlarged. No increase in right ventricular wall thickness. Right ventricular systolic function is moderately reduced. Left Atrium: Left atrial size was normal in size. Right Atrium: Right atrial size was normal in size. Pericardium: There is no evidence of pericardial effusion. Mitral Valve: The mitral valve is abnormal. There is mild thickening of the mitral valve leaflet(s). There is mild calcification of the mitral valve leaflet(s). Mild mitral annular calcification. Trivial mitral valve regurgitation. No evidence of mitral valve stenosis. Tricuspid Valve: The tricuspid valve is normal in structure. Tricuspid valve regurgitation is mild . No evidence of tricuspid stenosis. Aortic Valve: The aortic valve is tricuspid. There is mild calcification of the aortic valve. There is mild thickening of the aortic valve. Aortic valve regurgitation is not visualized. Aortic valve sclerosis is present, with no evidence of aortic valve stenosis. Pulmonic Valve: The pulmonic valve was normal in structure. Pulmonic valve regurgitation is not visualized. No evidence of pulmonic stenosis. Aorta: The aortic root is normal in size and structure. Venous: The inferior vena cava is dilated in size with greater than 50% respiratory variability, suggesting right atrial pressure of 8 mmHg. IAS/Shunts: No atrial level shunt detected by color flow Doppler.  LEFT VENTRICLE PLAX 2D LVIDd:         4.10 cm LVIDs:         3.00 cm LV PW:         1.10 cm LV IVS:        1.10 cm LVOT diam:     2.20 cm LV SV:         51 LV SV Index:   25 LVOT  Area:     3.80 cm  RIGHT VENTRICLE            IVC RV Basal diam:  5.00 cm    IVC diam: 2.70 cm RV Mid diam:    3.80 cm RV S prime:     7.95 cm/s TAPSE (M-mode): 1.3 cm LEFT ATRIUM             Index        RIGHT ATRIUM           Index LA diam:        3.70 cm 1.80 cm/m   RA Area:     22.80 cm LA Vol (A2C):   79.2 ml 38.63 ml/m  RA Volume:   92.00 ml  44.87 ml/m LA Vol (A4C):   56.0 ml 27.31 ml/m LA Biplane Vol: 68.7 ml 33.50 ml/m  AORTIC VALVE LVOT Vmax:   94.20 cm/s LVOT Vmean:  59.700 cm/s LVOT VTI:  0.135 m  AORTA Ao Root diam: 2.90 cm  SHUNTS Systemic VTI:  0.14 m Systemic Diam: 2.20 cm Jenkins Rouge MD Electronically signed by Jenkins Rouge MD Signature Date/Time: 03/11/2022/9:21:28 AM    Final    DG CHEST PORT 1 VIEW  Result Date: 03/10/2022 CLINICAL DATA:  Central line placement. EXAM: PORTABLE CHEST 1 VIEW COMPARISON:  Chest radiograph dated 03/09/2022. FINDINGS: Right-sided PICC with tip over central SVC. No pneumothorax. Right upper lobe opacities similar or slightly progressed since the prior radiograph. Left lung is clear. No pleural effusion. The cardiac silhouette is within normal limits. No acute osseous pathology. IMPRESSION: 1. Right-sided PICC with tip over central SVC. No pneumothorax. 2. Right upper lobe opacities similar or slightly progressed since the prior radiograph. Electronically Signed   By: Anner Crete M.D.   On: 03/10/2022 19:38   [place] EKG SITE RITE  Result Date: 03/10/2022 If Site Rite image not attached, placement could not be confirmed due to current cardiac rhythm.  US RENAL  Result Date: 03/10/2022 CLINICAL DATA:  Acute renal failure. EXAM: RENAL / URINARY TRACT ULTRASOUND COMPLETE COMPARISON:  None Available. FINDINGS: Right Kidney: Renal measurements: 11.6 x 4.3 x 6.1 cm = volume: 161.4 mL. Echogenicity within normal limits. No mass or hydronephrosis visualized. Left Kidney: Renal measurements: 11.2 x 6.1 x 6.0 cm = volume: 215.7 mL. Echogenicity within normal  limits. No mass or hydronephrosis visualized. Bladder: Appears normal for degree of bladder distention. Other: None. IMPRESSION: Normal renal ultrasound. Electronically Signed   By: Lajean Manes M.D.   On: 03/10/2022 11:57   CT Head Wo Contrast  Result Date: 03/09/2022 CLINICAL DATA:  Head trauma altered EXAM: CT HEAD WITHOUT CONTRAST TECHNIQUE: Contiguous axial images were obtained from the base of the skull through the vertex without intravenous contrast. RADIATION DOSE REDUCTION: This exam was performed according to the departmental dose-optimization program which includes automated exposure control, adjustment of the mA and/or kV according to patient size and/or use of iterative reconstruction technique. COMPARISON:  Brain MRI 11/10/2019, CT brain 11/10/2019 FINDINGS: Brain: No acute territorial infarction, hemorrhage or intracranial mass. Atrophy and chronic small vessel ischemic changes of the white matter. Stable ventricle size Vascular: No hyperdense vessels.  Carotid vascular calcification Skull: Normal. Negative for fracture or focal lesion. Sinuses/Orbits: No acute finding. Other: None IMPRESSION: 1. No CT evidence for acute intracranial abnormality. 2. Atrophy and chronic small vessel ischemic changes of the white matter. Electronically Signed   By: Donavan Foil M.D.   On: 03/09/2022 16:09   DG Chest Port 1 View  Result Date: 03/09/2022 CLINICAL DATA:  Respiratory distress EXAM: PORTABLE CHEST 1 VIEW COMPARISON:  11/10/2019 FINDINGS: The heart size and mediastinal contours are within normal limits. Extensive heterogeneous airspace opacity of the right upper lobe. The visualized skeletal structures are unremarkable. IMPRESSION: Extensive heterogeneous airspace opacity of the right upper lobe, most consistent with infection. Recommend follow-up radiographs in 6-8 weeks to ensure complete radiographic resolution and exclude underlying mass. Electronically Signed   By: Delanna Ahmadi M.D.   On:  03/09/2022 14:21    Microbiology: No results found for this or any previous visit (from the past 240 hour(s)).   Labs: Basic Metabolic Panel: Recent Labs  Lab 03/18/22 0523 03/19/22 0459 03/20/22 0525 03/21/22 0456 03/22/22 0341  NA 138 137 137 135 137  K 3.8 4.5 4.0 3.8 4.3  CL 103 103 102 97* 102  CO2 28 28 28 30 24   GLUCOSE 130* 121* 124* 85 104*  BUN 24* 21 22 19 16   CREATININE 0.85 0.98 0.97 0.95 1.22  CALCIUM 9.0 8.9 8.8* 8.5* 8.4*  MG 2.1 2.0 1.9 1.9 2.0   Liver Function Tests: Recent Labs  Lab 03/17/22 0445 03/19/22 0459  AST 36 36  ALT 60* 76*  ALKPHOS 84 74  BILITOT 0.5 0.7  PROT 6.5 5.6*  ALBUMIN 2.1* 2.0*   No results for input(s): "LIPASE", "AMYLASE" in the last 168 hours. No results for input(s): "AMMONIA" in the last 168 hours. CBC: Recent Labs  Lab 03/17/22 0445 03/18/22 0523 03/19/22 0459 03/20/22 0525 03/21/22 0456 03/22/22 0341  WBC 27.0* 25.5* 27.3* 23.6* 19.2* 16.6*  NEUTROABS 24.8*  --   --   --   --   --   HGB 13.2 12.3* 12.2* 12.6* 12.1* 11.2*  HCT 40.6 36.8* 35.9* 37.8* 37.1* 32.4*  MCV 101.5* 101.4* 99.4 100.3* 101.6* 100.9*  PLT 551* 480* 447* 459* 374 343   Cardiac Enzymes: No results for input(s): "CKTOTAL", "CKMB", "CKMBINDEX", "TROPONINI" in the last 168 hours. BNP: BNP (last 3 results) Recent Labs    03/09/22 1400 03/11/22 0500 03/12/22 0446  BNP 1,309.2* 586.3* 642.5*    ProBNP (last 3 results) No results for input(s): "PROBNP" in the last 8760 hours.  CBG: Recent Labs  Lab 03/22/22 1704 03/22/22 2118 03/23/22 0607 03/23/22 0807 03/23/22 1052  GLUCAP 117* 172* 92 102* 118*       Signed:  03/25/22, MD [facility] 03/23/2022, 5:08 PM

## 2022-03-23 NOTE — TOC Transition Note (Addendum)
Transition of Care [facility]) - CM/SW Discharge Note   Patient Details  Name: Mark [name] MRN: [id number] Date of Birth: 01/31/1951  Transition of Care [facility]) CM/SW Contact:  Leone Haven, RN Phone Number: 03/23/2022, 9:49 AM   Clinical Narrative:    Patient is set up with [facility] for [facility] services.  Will need to contact son to let him know when patient is ready for discharge.  NCM contacted son to let him know patient is for dc today, he states his mom will be here shortly to transport him home.    Final next level of care: Home w Home Health Services Barriers to Discharge: Continued Medical Work up   Patient Goals and CMS Choice Patient states their goals for this hospitalization and ongoing recovery are:: home with [facility] CMS Medicare.gov Compare Post Acute Care list provided to:: Patient Represenative (must comment) Choice offered to / list presented to : Adult Children  Discharge Placement                       Discharge Plan and Services In-house Referral: NA Discharge Planning Services: CM Consult Post Acute Care Choice: Home Health            DME Agency: NA       HH Arranged: RN, Disease Management, PT, OT, Social Work [employer] Agency: [facility] Date [facility] Agency Contacted: 03/23/22 Time HH Agency Contacted: 0945 Representative spoke with at [facility] Agency: Kandee Keen  Social Determinants of Health (SDOH) Interventions     Readmission Risk Interventions     No data to display

## 2022-03-23 NOTE — TOC Progression Note (Signed)
Transition of Care [facility]) - Progression Note    Patient Details  Name: Mark [name] MRN: [id number] Date of Birth: Dec 25, 1950  Transition of Care [facility]) CM/SW Contact  Erin Sons, [place] Phone Number: 03/23/2022, 10:03 AM  Clinical Narrative:     CSW provided alcohol treatment resources; left on counter. When discussing resources pt denies alcohol use or any other substance use. Explained he used to drink when he was in the [employer] but no longer drinks.   Expected Discharge Plan: Home w Home Health Services Barriers to Discharge: Continued Medical Work up  Expected Discharge Plan and Services Expected Discharge Plan: Home w Home Health Services In-house Referral: NA Discharge Planning Services: CM Consult Post Acute Care Choice: Home Health Living arrangements for the past 2 months: Single Family Home                   DME Agency: NA       HH Arranged: RN, Disease Management, PT, OT, Social Work [employer] Agency: [facility] Date [facility] Agency Contacted: 03/23/22 Time HH Agency Contacted: 0945 Representative spoke with at [facility] Agency: Kandee Keen   Social Determinants of Health (SDOH) Interventions    Readmission Risk Interventions     No data to display

## 2022-03-23 NOTE — Progress Notes (Signed)
PT Cancellation Note  Patient Details Name: Mark [name] MRN: [id number] DOB: 1950/12/31   Cancelled Treatment:    Reason Eval/Treat Not Completed: Patient declined, no reason specified, pt declining all mobility despite encouragement, stating he has been up this morning moving independently. Will check back as schedule allows to continue with PT POC.  Lenora Boys. PTA Acute Rehabilitation Services Office: [phone]    Catalina Antigua 03/23/2022, 8:25 AM

## 2022-03-23 NOTE — Progress Notes (Signed)
OT Cancellation Note  Patient Details Name: Ronn Smolinsky MRN: [id number] DOB: [birth year]   Cancelled Treatment:    Reason Eval/Treat Not Completed: Patient declined, no reason specified (Patient adamantly refused OT today. Patient stated he did not want to do anything because he is going home today. OT explained to patient to address self care and patient stated his son can help him when he get home. Will attempt again as schedule permit) Alfonse Flavors, OTA Acute Rehabilitation Services  Office [phone]  Dewain Penning 03/23/2022, 9:14 AM

## 2022-03-23 NOTE — TOC Progression Note (Signed)
Transition of Care [facility]) - Progression Note    Patient Details  Name: Mark [name] MRN: [id number] Date of Birth: [birth year]  [facility]) CM/SW Contact  Leone Haven, RN Phone Number: 03/23/2022, 9:46 AM  Clinical Narrative:    NCM informed by CSW that patient and family has decided to take him home with [facility] services, Son was offered choice by CSW , son states does not have a preference.  NCM made referral to [facility] with Frances Furbish for [facility], HHPT,HHOT, HHAIDE, and Child psychotherapist. He is able to take referral.  Soc will begin 24 to 48 hrs post dc.  Son states his mom or his sister will transport patient home at discharge.  Also he states he , his sister Loraine Grip and his mom are his support system they will be checking on him.    Expected Discharge Plan: Home w Home Health Services Barriers to Discharge: Continued Medical Work up  Expected Discharge Plan and Services Expected Discharge Plan: Home w Home Health Services In-house Referral: NA Discharge Planning Services: CM Consult Post Acute Care Choice: Home Health Living arrangements for the past 2 months: Single Family Home                   DME Agency: NA       HH Arranged: RN, Disease Management, PT, OT, Social Work [employer] Agency: [facility] Date [facility] Agency Contacted: 03/23/22 Time HH Agency Contacted: 0945 Representative spoke with at [facility] Agency: Kandee Keen   Social Determinants of Health (SDOH) Interventions    Readmission Risk Interventions     No data to display

## 2022-03-24 ENCOUNTER — Telehealth: Payer: Self-pay

## 2022-03-24 NOTE — Telephone Encounter (Signed)
Mark [name] messaged back to state he would be seen Monday. Called patient, not able to connect, called patients son and he appreciated the information.

## 2022-03-24 NOTE — Telephone Encounter (Signed)
Patient was discharged yesterday and is calling the unit to see when [facility] will come see him. Cory from [place] to make them aware.

## 2022-03-26 ENCOUNTER — Ambulatory Visit: Payer: Medicare HMO | Attending: Nurse Practitioner | Admitting: Nurse Practitioner

## 2022-04-09 ENCOUNTER — Encounter: Payer: Self-pay | Admitting: Pulmonary Disease

## 2022-04-18 ENCOUNTER — Institutional Professional Consult (permissible substitution): Payer: Medicare HMO | Admitting: Pulmonary Disease

## 2022-05-09 ENCOUNTER — Other Ambulatory Visit: Payer: Self-pay | Admitting: Student

## 2022-05-09 DIAGNOSIS — K4090 Unilateral inguinal hernia, without obstruction or gangrene, not specified as recurrent: Secondary | ICD-10-CM

## 2022-07-22 ENCOUNTER — Emergency Department (HOSPITAL_COMMUNITY): Payer: Medicare HMO

## 2022-07-22 ENCOUNTER — Encounter (HOSPITAL_COMMUNITY): Payer: Self-pay | Admitting: Emergency Medicine

## 2022-07-22 ENCOUNTER — Emergency Department (HOSPITAL_COMMUNITY)
Admission: EM | Admit: 2022-07-22 | Discharge: 2022-07-23 | Disposition: A | Discharge status: Home / Self Care | Payer: Medicare HMO | Attending: Emergency Medicine | Admitting: Emergency Medicine

## 2022-07-22 ENCOUNTER — Other Ambulatory Visit: Payer: Self-pay

## 2022-07-22 DIAGNOSIS — Y908 Blood alcohol level of 240 mg/100 ml or more: Secondary | ICD-10-CM | POA: Diagnosis not present

## 2022-07-22 DIAGNOSIS — R7401 Elevation of levels of liver transaminase levels: Secondary | ICD-10-CM | POA: Diagnosis not present

## 2022-07-22 DIAGNOSIS — I4891 Unspecified atrial fibrillation: Secondary | ICD-10-CM | POA: Insufficient documentation

## 2022-07-22 DIAGNOSIS — Z7951 Long term (current) use of inhaled steroids: Secondary | ICD-10-CM | POA: Insufficient documentation

## 2022-07-22 DIAGNOSIS — U071 COVID-19: Secondary | ICD-10-CM | POA: Insufficient documentation

## 2022-07-22 DIAGNOSIS — F10129 Alcohol abuse with intoxication, unspecified: Secondary | ICD-10-CM | POA: Insufficient documentation

## 2022-07-22 DIAGNOSIS — W1789XA Other fall from one level to another, initial encounter: Secondary | ICD-10-CM | POA: Diagnosis not present

## 2022-07-22 DIAGNOSIS — Z7901 Long term (current) use of anticoagulants: Secondary | ICD-10-CM | POA: Diagnosis not present

## 2022-07-22 DIAGNOSIS — R519 Headache, unspecified: Secondary | ICD-10-CM | POA: Diagnosis not present

## 2022-07-22 DIAGNOSIS — J449 Chronic obstructive pulmonary disease, unspecified: Secondary | ICD-10-CM | POA: Diagnosis not present

## 2022-07-22 DIAGNOSIS — W19XXXA Unspecified fall, initial encounter: Secondary | ICD-10-CM

## 2022-07-22 DIAGNOSIS — Z79899 Other long term (current) drug therapy: Secondary | ICD-10-CM | POA: Insufficient documentation

## 2022-07-22 DIAGNOSIS — I10 Essential (primary) hypertension: Secondary | ICD-10-CM | POA: Diagnosis not present

## 2022-07-22 DIAGNOSIS — R0602 Shortness of breath: Secondary | ICD-10-CM | POA: Diagnosis present

## 2022-07-22 LAB — CBC WITH DIFFERENTIAL/PLATELET
Abs Immature Granulocytes: 0.01 10*3/uL (ref 0.00–0.07)
Basophils Absolute: 0.1 10*3/uL (ref 0.0–0.1)
Basophils Relative: 1 %
Eosinophils Absolute: 0 10*3/uL (ref 0.0–0.5)
Eosinophils Relative: 0 %
HCT: 46.1 % (ref 39.0–52.0)
Hemoglobin: 15.6 g/dL (ref 13.0–17.0)
Immature Granulocytes: 0 %
Lymphocytes Relative: 54 %
Lymphs Abs: 3.8 10*3/uL (ref 0.7–4.0)
MCH: 32.4 pg (ref 26.0–34.0)
MCHC: 33.8 g/dL (ref 30.0–36.0)
MCV: 95.6 fL (ref 80.0–100.0)
Monocytes Absolute: 0.6 10*3/uL (ref 0.1–1.0)
Monocytes Relative: 9 %
Neutro Abs: 2.5 10*3/uL (ref 1.7–7.7)
Neutrophils Relative %: 36 %
Platelets: 261 10*3/uL (ref 150–400)
RBC: 4.82 MIL/uL (ref 4.22–5.81)
RDW: 12.7 % (ref 11.5–15.5)
WBC: 6.9 10*3/uL (ref 4.0–10.5)
nRBC: 0 % (ref 0.0–0.2)

## 2022-07-22 LAB — ETHANOL: Alcohol, Ethyl (B): 268 mg/dL — ABNORMAL HIGH (ref <10)

## 2022-07-22 NOTE — ED Provider Notes (Signed)
[place] EMERGENCY DEPARTMENT Provider Note   CSN: [id number] Arrival date & time: 07/22/22  2235     History {Add pertinent medical, surgical, social history, OB history to HPI:1} No chief complaint on file.   Mark [name] is a 72 y.o. male.  The history is provided by the patient and medical records.   72 year old male with history of alcohol abuse, A-fib on Eliquis, COPD, hypertension, presenting to the ED as a fall on thinners.  Reportedly he has been drinking heavily for the past 2 days, fell at home today and struck his head on the floor.  Unclear if loss of consciousness.  By time of EMS arrival he was sitting on the couch but complaining that he could not stand as he did not feel well.  He reports he has been short of breath.  Did have some wheezing with EMS and was given DuoNeb with improvement.  Does report frequent pneumonia.  He has had some cough but denies fever.  Home Medications Prior to Admission medications   Medication Sig Start Date End Date Taking? Authorizing Provider  albuterol (PROVENTIL HFA;VENTOLIN HFA) 108 (90 Base) MCG/ACT inhaler Inhale 2 puffs into the lungs every 4 (four) hours as needed for wheezing or shortness of breath. 07/03/16   Horton, Barbette Hair, MD  apixaban (ELIQUIS) 5 MG TABS tablet Take 1 tablet (5 mg total) by mouth 2 (two) times daily. 03/23/22 06/21/22  Kayleen Memos, DO  atorvastatin (LIPITOR) 20 MG tablet Take 20 mg by mouth daily. 01/03/22   [provider]  diltiazem (CARDIZEM CD) 360 MG 24 hr capsule Take 1 capsule (360 mg total) by mouth daily. 03/24/22 04/23/22  Kayleen Memos, DO  furosemide (LASIX) 20 MG tablet Take 1 tablet (20 mg total) by mouth daily. 03/24/22 04/23/22  Kayleen Memos, DO  hydrOXYzine (ATARAX) 10 MG tablet Take 10-20 mg by mouth at bedtime. 02/19/22   [provider]  metoprolol succinate (TOPROL-XL) 100 MG 24 hr tablet Take 1 tablet (100 mg total) by mouth daily. Take with or  immediately following a meal. 03/24/22 04/23/22  Kayleen Memos, DO  Multiple Vitamin (MULTIVITAMIN WITH MINERALS) TABS tablet Take 1 tablet by mouth daily.    [provider]  nicotine (NICODERM CQ - DOSED IN MG/24 HR) 7 mg/24hr patch Place 1 patch (7 mg total) onto the skin daily. 03/24/22   Kayleen Memos, DO  omeprazole (PRILOSEC) 20 MG capsule Take 1 capsule (20 mg total) by mouth daily. Patient taking differently: Take 20 mg by mouth daily as needed (acid reflux). 10/28/19   Tegeler, Gwenyth Allegra, MD  SYMBICORT 160-4.5 MCG/ACT inhaler Inhale 2 puffs into the lungs daily.  10/14/19   [provider]  Donnal Debar 200-62.5-25 MCG/ACT AEPB Inhale 1 puff into the lungs daily. 02/20/22   [provider]      Allergies    Patient has no known allergies.    Review of Systems   Review of Systems  Respiratory:  Positive for cough, shortness of breath and wheezing.   All other systems reviewed and are negative.   Physical Exam Updated Vital Signs BP (!) 149/91 (BP Location: Right Arm)   Pulse (!) 108   Temp 98.2 F (36.8 C) (Oral)   Resp 20   SpO2 96%  Physical Exam Vitals and nursing note reviewed.  Constitutional:      Appearance: He is well-developed.     Comments: Intoxicated  HENT:  Head: Normocephalic and atraumatic.     Mouth/Throat:     Comments: Breath smells of EtOH Eyes:     Conjunctiva/sclera: Conjunctivae normal.     Pupils: Pupils are equal, round, and reactive to light.  Cardiovascular:     Rate and Rhythm: Normal rate and regular rhythm.     Heart sounds: Normal heart sounds.  Pulmonary:     Effort: Pulmonary effort is normal.     Breath sounds: Normal breath sounds. No wheezing or rhonchi.  Abdominal:     General: Bowel sounds are normal.     Palpations: Abdomen is soft.  Musculoskeletal:        General: Normal range of motion.     Cervical back: Normal range of motion.  Skin:    General: Skin is warm and dry.  Neurological:      Mental Status: He is alert and oriented to person, place, and time.     ED Results / Procedures / Treatments   Labs (all labs ordered are listed, but only abnormal results are displayed) Labs Reviewed  RESP PANEL BY RT-PCR (RSV, FLU A&B, COVID)  RVPGX2  CBC WITH DIFFERENTIAL/PLATELET  COMPREHENSIVE METABOLIC PANEL  ETHANOL  RAPID URINE DRUG SCREEN, HOSP PERFORMED  BRAIN NATRIURETIC PEPTIDE  TROPONIN I (HIGH SENSITIVITY)    EKG None  Radiology No results found.  Procedures Procedures  {Document cardiac monitor, telemetry assessment procedure when appropriate:1}  Medications Ordered in ED Medications - No data to display  ED Course/ Medical Decision Making/ A&P   {   Click here for ABCD2, HEART and other calculatorsREFRESH Note before signing :1}                          Medical Decision Making Amount and/or Complexity of Data Reviewed Labs: ordered. Radiology: ordered.   ***  {Document critical care time when appropriate:1} {Document review of labs and clinical decision tools ie heart score, Chads2Vasc2 etc:1}  {Document your independent review of radiology images, and any outside records:1} {Document your discussion with family members, caretakers, and with consultants:1} {Document social determinants of health affecting pt's care:1} {Document your decision making why or why not admission, treatments were needed:1} Final Clinical Impression(s) / ED Diagnoses Final diagnoses:  None    Rx / DC Orders ED Discharge Orders     None

## 2022-07-22 NOTE — Progress Notes (Signed)
Orthopedic Tech Progress Note Patient Details:  Mark [name] [birth year] [id number]  Patient ID: Mark [name], male   DOB: 11-29-1950, 72 y.o.   MRN: [id number] I attended trauma page. Karolee Stamps 07/22/2022, 11:48 PM

## 2022-07-22 NOTE — ED Triage Notes (Signed)
Patient BIB [facility] from home for fall on thinners, takes Eliquis for a-fib. Patient states he was walking in his home and loss balance and fell, face down on wooden floor. He complaints of pain on nose, abrasions noted on top of the nose (no bleeding). He also complaints of right hip pain and no LOC. Patient states he drinks everyday, increased drinking in the last 2 days, watching football. Per EMS patient found wheezing with Spo2-87% on room air, given duo-neb 5mg  albuterol/2.5mg  atrovent.   Per EMS: BP-158/81 Spo2-100% on room air post duo-neb CBG-91

## 2022-07-23 DIAGNOSIS — U071 COVID-19: Secondary | ICD-10-CM | POA: Diagnosis not present

## 2022-07-23 LAB — RESP PANEL BY RT-PCR (RSV, FLU A&B, COVID)  RVPGX2
Influenza A by PCR: NEGATIVE
Influenza B by PCR: NEGATIVE
Resp Syncytial Virus by PCR: NEGATIVE
SARS Coronavirus 2 by RT PCR: POSITIVE — AB

## 2022-07-23 LAB — COMPREHENSIVE METABOLIC PANEL
ALT: 56 U/L — ABNORMAL HIGH (ref 0–44)
AST: 98 U/L — ABNORMAL HIGH (ref 15–41)
Albumin: 3.3 g/dL — ABNORMAL LOW (ref 3.5–5.0)
Alkaline Phosphatase: 99 U/L (ref 38–126)
Anion gap: 15 (ref 5–15)
BUN: 7 mg/dL — ABNORMAL LOW (ref 8–23)
CO2: 23 mmol/L (ref 22–32)
Calcium: 8.9 mg/dL (ref 8.9–10.3)
Chloride: 101 mmol/L (ref 98–111)
Creatinine, Ser: 1.1 mg/dL (ref 0.61–1.24)
GFR, Estimated: >60 mL/min (ref >60)
Glucose, Bld: 71 mg/dL (ref 70–99)
Potassium: 3.5 mmol/L (ref 3.5–5.1)
Sodium: 139 mmol/L (ref 135–145)
Total Bilirubin: 1.3 mg/dL — ABNORMAL HIGH (ref 0.3–1.2)
Total Protein: 7.7 g/dL (ref 6.5–8.1)

## 2022-07-23 LAB — TROPONIN I (HIGH SENSITIVITY)
Troponin I (High Sensitivity): 10 ng/L (ref <18)
Troponin I (High Sensitivity): 11 ng/L (ref <18)

## 2022-07-23 LAB — BRAIN NATRIURETIC PEPTIDE: B Natriuretic Peptide: 31 pg/mL (ref 0.0–100.0)

## 2022-07-23 MED ORDER — PREDNISONE 10 MG PO TABS: 20.0000 mg | ORAL_TABLET | Freq: Every day | ORAL | 0 refills | Status: DC

## 2022-07-23 MED ORDER — MOLNUPIRAVIR 200 MG PO CAPS: 4.0000 | ORAL_CAPSULE | Freq: Two times a day (BID) | ORAL | 0 refills | Status: AC

## 2022-07-23 MED ORDER — BENZONATATE 100 MG PO CAPS: 100.0000 mg | ORAL_CAPSULE | Freq: Three times a day (TID) | ORAL | 0 refills | Status: DC

## 2022-07-23 NOTE — Discharge Instructions (Addendum)
As we discussed you do have covid-19. Take the prescribed medication as directed-- have sent to your pharmacy for you.  Use your inhalers when needed. Follow-up with your primary care doctor. Return to the ED for new or worsening symptoms.

## 2022-08-02 ENCOUNTER — Encounter: Payer: Self-pay | Admitting: Student

## 2022-08-02 DIAGNOSIS — F17210 Nicotine dependence, cigarettes, uncomplicated: Secondary | ICD-10-CM

## 2022-08-03 ENCOUNTER — Encounter: Payer: Self-pay | Admitting: Student

## 2022-08-15 ENCOUNTER — Other Ambulatory Visit: Payer: Self-pay | Admitting: Student

## 2022-08-15 DIAGNOSIS — F17210 Nicotine dependence, cigarettes, uncomplicated: Secondary | ICD-10-CM

## 2022-08-15 DIAGNOSIS — Z0000 Encounter for general adult medical examination without abnormal findings: Secondary | ICD-10-CM

## 2022-10-23 ENCOUNTER — Other Ambulatory Visit: Payer: Self-pay

## 2022-10-23 ENCOUNTER — Emergency Department (HOSPITAL_COMMUNITY): Payer: Medicare HMO

## 2022-10-23 ENCOUNTER — Emergency Department (HOSPITAL_COMMUNITY)
Admission: EM | Admit: 2022-10-23 | Discharge: 2022-10-23 | Discharge status: Against Medical Advice | Payer: Medicare HMO | Attending: Emergency Medicine | Admitting: Emergency Medicine

## 2022-10-23 DIAGNOSIS — Z5321 Procedure and treatment not carried out due to patient leaving prior to being seen by health care provider: Secondary | ICD-10-CM | POA: Insufficient documentation

## 2022-10-23 DIAGNOSIS — N492 Inflammatory disorders of scrotum: Secondary | ICD-10-CM | POA: Diagnosis present

## 2022-10-23 LAB — CBC WITH DIFFERENTIAL/PLATELET
Abs Immature Granulocytes: 0.01 10*3/uL (ref 0.00–0.07)
Basophils Absolute: 0.1 10*3/uL (ref 0.0–0.1)
Basophils Relative: 1 %
Eosinophils Absolute: 0 10*3/uL (ref 0.0–0.5)
Eosinophils Relative: 1 %
HCT: 41.8 % (ref 39.0–52.0)
Hemoglobin: 13.7 g/dL (ref 13.0–17.0)
Immature Granulocytes: 0 %
Lymphocytes Relative: 39 %
Lymphs Abs: 2.7 10*3/uL (ref 0.7–4.0)
MCH: 31.9 pg (ref 26.0–34.0)
MCHC: 32.8 g/dL (ref 30.0–36.0)
MCV: 97.4 fL (ref 80.0–100.0)
Monocytes Absolute: 0.7 10*3/uL (ref 0.1–1.0)
Monocytes Relative: 11 %
Neutro Abs: 3.3 10*3/uL (ref 1.7–7.7)
Neutrophils Relative %: 48 %
Platelets: 229 10*3/uL (ref 150–400)
RBC: 4.29 MIL/uL (ref 4.22–5.81)
RDW: 13.2 % (ref 11.5–15.5)
WBC: 6.8 10*3/uL (ref 4.0–10.5)
nRBC: 0 % (ref 0.0–0.2)

## 2022-10-23 LAB — BASIC METABOLIC PANEL
Anion gap: 14 (ref 5–15)
BUN: 9 mg/dL (ref 8–23)
CO2: 23 mmol/L (ref 22–32)
Calcium: 9.6 mg/dL (ref 8.9–10.3)
Chloride: 104 mmol/L (ref 98–111)
Creatinine, Ser: 1.12 mg/dL (ref 0.61–1.24)
GFR, Estimated: >60 mL/min (ref >60)
Glucose, Bld: 116 mg/dL — ABNORMAL HIGH (ref 70–99)
Potassium: 3 mmol/L — ABNORMAL LOW (ref 3.5–5.1)
Sodium: 141 mmol/L (ref 135–145)

## 2022-10-23 NOTE — ED Triage Notes (Signed)
Pt reports R inguinal hernia x 1 month. Started off small but as he continues to lift at work it has grown.

## 2022-10-23 NOTE — ED Provider Triage Note (Signed)
Emergency Medicine Provider Triage Evaluation Note  Mark [name], a 72 y.o. male  was evaluated in triage.  Pt complains of scrotal swelling for a month.  States that he believes it is due to frequently lifting at work.  States that he believes it is a hernia.  He has not previously seen a Careers adviser for this.  Denies previous evaluation for this.  He denies associated pain or difficulty urinating or having a bowel movement.  He denies fever, chills, nausea, vomiting, or diarrhea.  Last bowel movement was yesterday and normal.  Review of Systems  Positive: See HPI Negative: See HPI  Physical Exam  There were no vitals taken for this visit. Gen:   Awake, no distress   Resp:  Normal effort  MSK:   Moves extremities without difficulty  Other:  Abdomen soft, nontender, and without rebound, guarding, peritoneal signs, or distention; moderate scrotal swelling with minimal overlying erythema, no tenderness, no discharge  Medical Decision Making  Medically screening exam initiated at 3:59 PM.  Appropriate orders placed.  Mark [name] was informed that the remainder of the evaluation will be completed by another provider, this initial triage assessment does not replace that evaluation, and the importance of remaining in the ED until their evaluation is complete.     Tonette Lederer, PA-C 10/23/22 1600

## 2022-11-15 ENCOUNTER — Inpatient Hospital Stay: Admission: RE | Admit: 2022-11-15 | Payer: Medicare HMO | Source: Ambulatory Visit

## 2022-12-11 ENCOUNTER — Encounter: Payer: Self-pay | Admitting: Student

## 2022-12-14 ENCOUNTER — Inpatient Hospital Stay: Admission: RE | Admit: 2022-12-14 | Payer: Medicare HMO | Source: Ambulatory Visit

## 2022-12-27 ENCOUNTER — Other Ambulatory Visit: Payer: Medicare HMO

## 2023-01-15 ENCOUNTER — Ambulatory Visit
Admission: RE | Admit: 2023-01-15 | Discharge: 2023-01-15 | Disposition: A | Discharge status: Home / Self Care | Payer: Medicare HMO | Source: Ambulatory Visit | Attending: Student | Admitting: Student

## 2023-01-15 DIAGNOSIS — K4090 Unilateral inguinal hernia, without obstruction or gangrene, not specified as recurrent: Secondary | ICD-10-CM

## 2023-01-15 MED ORDER — IOPAMIDOL (ISOVUE-300) INJECTION 61%
100.0000 mL | Freq: Once | INTRAVENOUS | Status: AC | PRN
  Administered 2023-01-15: 100 mL via INTRAVENOUS

## 2023-03-31 ENCOUNTER — Emergency Department (HOSPITAL_COMMUNITY): Payer: Medicare HMO

## 2023-03-31 ENCOUNTER — Emergency Department (HOSPITAL_COMMUNITY)
Admission: EM | Admit: 2023-03-31 | Discharge: 2023-03-31 | Disposition: A | Discharge status: Home / Self Care | Payer: Medicare HMO | Attending: Emergency Medicine | Admitting: Emergency Medicine

## 2023-03-31 ENCOUNTER — Other Ambulatory Visit: Payer: Self-pay

## 2023-03-31 ENCOUNTER — Encounter (HOSPITAL_COMMUNITY): Payer: Self-pay

## 2023-03-31 DIAGNOSIS — Z7901 Long term (current) use of anticoagulants: Secondary | ICD-10-CM | POA: Insufficient documentation

## 2023-03-31 DIAGNOSIS — R918 Other nonspecific abnormal finding of lung field: Secondary | ICD-10-CM | POA: Diagnosis not present

## 2023-03-31 DIAGNOSIS — W19XXXA Unspecified fall, initial encounter: Secondary | ICD-10-CM | POA: Diagnosis not present

## 2023-03-31 DIAGNOSIS — S2242XA Multiple fractures of ribs, left side, initial encounter for closed fracture: Secondary | ICD-10-CM | POA: Diagnosis not present

## 2023-03-31 DIAGNOSIS — S299XXA Unspecified injury of thorax, initial encounter: Secondary | ICD-10-CM | POA: Diagnosis present

## 2023-03-31 MED ORDER — HYDROCODONE-ACETAMINOPHEN 5-325 MG PO TABS
1.0000 | ORAL_TABLET | ORAL | 0 refills | Status: DC | PRN
Start: 2023-03-31 — End: 2023-05-08

## 2023-03-31 MED ORDER — HYDROCODONE-ACETAMINOPHEN 5-325 MG PO TABS
1.0000 | ORAL_TABLET | Freq: Once | ORAL | Status: AC
  Administered 2023-03-31: 1 via ORAL
  Filled 2023-03-31: qty 1

## 2023-03-31 NOTE — ED Provider Notes (Signed)
[name] EMERGENCY DEPARTMENT AT [facility] Provider Note   CSN: [id number] Arrival date & time: 03/31/23  4098     History  Chief Complaint  Patient presents with   Fall    On thinners    Mark [name] is a 72 y.o. male.  Patient chronically on Eliquis presents to the emergency department with complaints of left rib pain after a fall.  Patient did not fall tonight, thinks he fell sometime this week.  He fell up against an object, hitting the left side of his chest.  He has been having pain in the left ribs that worsens when he moves or breathes.  He reports that it feels like something is popping and moving in the rib cage.       Home Medications Prior to Admission medications   Medication Sig Start Date End Date Taking? Authorizing Provider  HYDROcodone-acetaminophen (NORCO/VICODIN) 5-325 MG tablet Take 1-2 tablets by mouth every 4 (four) hours as needed for moderate pain. 03/31/23  Yes Sheran Newstrom, Canary Brim, MD  albuterol (PROVENTIL HFA;VENTOLIN HFA) 108 (90 Base) MCG/ACT inhaler Inhale 2 puffs into the lungs every 4 (four) hours as needed for wheezing or shortness of breath. 07/03/16   Horton, Mayer Masker, MD  apixaban (ELIQUIS) 5 MG TABS tablet Take 1 tablet (5 mg total) by mouth 2 (two) times daily. 03/23/22 06/21/22  Darlin Drop, DO  atorvastatin (LIPITOR) 20 MG tablet Take 20 mg by mouth daily. 01/03/22   [provider]  benzonatate (TESSALON) 100 MG capsule Take 1 capsule (100 mg total) by mouth every 8 (eight) hours. 07/23/22   Garlon Hatchet, PA-C  diltiazem (CARDIZEM CD) 360 MG 24 hr capsule Take 1 capsule (360 mg total) by mouth daily. 03/24/22 04/23/22  Darlin Drop, DO  furosemide (LASIX) 20 MG tablet Take 1 tablet (20 mg total) by mouth daily. 03/24/22 04/23/22  Darlin Drop, DO  hydrOXYzine (ATARAX) 10 MG tablet Take 10-20 mg by mouth at bedtime. 02/19/22   [provider]  metoprolol succinate (TOPROL-XL) 100 MG 24 hr tablet Take  1 tablet (100 mg total) by mouth daily. Take with or immediately following a meal. 03/24/22 04/23/22  Darlin Drop, DO  Multiple Vitamin (MULTIVITAMIN WITH MINERALS) TABS tablet Take 1 tablet by mouth daily.    [provider]  nicotine (NICODERM CQ - DOSED IN MG/24 HR) 7 mg/24hr patch Place 1 patch (7 mg total) onto the skin daily. 03/24/22   Darlin Drop, DO  omeprazole (PRILOSEC) 20 MG capsule Take 1 capsule (20 mg total) by mouth daily. Patient taking differently: Take 20 mg by mouth daily as needed (acid reflux). 10/28/19   Tegeler, Canary Brim, MD  predniSONE (DELTASONE) 10 MG tablet Take 2 tablets (20 mg total) by mouth daily. 07/23/22   Garlon Hatchet, PA-C  SYMBICORT 160-4.5 MCG/ACT inhaler Inhale 2 puffs into the lungs daily.  10/14/19   [provider]  Dwyane Luo 200-62.5-25 MCG/ACT AEPB Inhale 1 puff into the lungs daily. 02/20/22   [provider]      Allergies    Patient has no known allergies.    Review of Systems   Review of Systems  Physical Exam Updated Vital Signs BP (!) 151/91 (BP Location: Right Arm)   Pulse 92   Temp (!) 97.5 F (36.4 C) (Oral)   Resp 18   Ht 6\' 2"  (1.88 m)   Wt 77.1 kg   SpO2 100%   BMI  21.83 kg/m  Physical Exam Vitals and nursing note reviewed.  Constitutional:      General: He is not in acute distress.    Appearance: He is well-developed.  HENT:     Head: Normocephalic and atraumatic.     Mouth/Throat:     Mouth: Mucous membranes are moist.  Eyes:     General: Vision grossly intact. Gaze aligned appropriately.     Extraocular Movements: Extraocular movements intact.     Conjunctiva/sclera: Conjunctivae normal.  Cardiovascular:     Rate and Rhythm: Normal rate and regular rhythm.     Pulses: Normal pulses.     Heart sounds: Normal heart sounds, S1 normal and S2 normal. No murmur heard.    No friction rub. No gallop.  Pulmonary:     Effort: Pulmonary effort is normal. No respiratory distress.      Breath sounds: Normal breath sounds.  Chest:     Chest wall: Tenderness present.    Abdominal:     Palpations: Abdomen is soft.     Tenderness: There is no abdominal tenderness. There is no guarding or rebound.     Hernia: No hernia is present.  Musculoskeletal:        General: No swelling.     Cervical back: Full passive range of motion without pain, normal range of motion and neck supple. No pain with movement, spinous process tenderness or muscular tenderness. Normal range of motion.     Right lower leg: No edema.     Left lower leg: No edema.  Skin:    General: Skin is warm and dry.     Capillary Refill: Capillary refill takes less than 2 seconds.     Findings: No ecchymosis, erythema, lesion or wound.  Neurological:     Mental Status: He is alert and oriented to person, place, and time.     GCS: GCS eye subscore is 4. GCS verbal subscore is 5. GCS motor subscore is 6.     Cranial Nerves: Cranial nerves 2-12 are intact.     Sensory: Sensation is intact.     Motor: Motor function is intact. No weakness or abnormal muscle tone.     Coordination: Coordination is intact.  Psychiatric:        Mood and Affect: Mood normal.        Speech: Speech normal.        Behavior: Behavior normal.     ED Results / Procedures / Treatments   Labs (all labs ordered are listed, but only abnormal results are displayed) Labs Reviewed - No data to display  EKG None  Radiology DG Ribs Unilateral W/Chest Left  Result Date: 03/31/2023 CLINICAL DATA:  72 year old male with history of trauma from a fall complaining of left-sided rib pain. EXAM: LEFT RIBS AND CHEST - 3+ VIEW COMPARISON:  Chest x-ray 07/22/2022. FINDINGS: Lung volumes are increased with emphysematous changes. Previously noted apparent airspace consolidation in the apex of the right lung now appears to be a thick-walled area of cavitation. Architectural distortion extending from the right hilar region into the right apex, with right  hilar fullness. Left lung is clear. No pleural effusions. No pneumothorax. No evidence of pulmonary edema. Heart size is normal. Atherosclerotic calcifications are noted in the thoracic aorta. Dedicated views of the left ribs demonstrate a subtle minimally displaced fractures of the anterolateral left eighth and ninth ribs. IMPRESSION: 1. Minimally displaced fractures of the anterolateral left eighth and ninth ribs. 2. Abnormal appearance of the chest  with what appears to be extensive post infectious scarring in the right upper lobe including a thick-walled cavitary region in the right apex. Given the right hilar fullness and architectural distortion extending toward the right hilar lobe, the possibility of a centrally obstructing neoplasm should be considered. Further evaluation with nonemergent contrast-enhanced chest CT is recommended in the near future to better evaluate these findings and establish a baseline for future follow-up examinations. 3. Emphysema. 4. Aortic atherosclerosis. Electronically Signed   By: Trudie Reed M.D.   On: 03/31/2023 07:14   CT HEAD WO CONTRAST ( )  Result Date: 03/31/2023 CLINICAL DATA:  Minor head trauma EXAM: CT HEAD WITHOUT CONTRAST TECHNIQUE: Contiguous axial images were obtained from the base of the skull through the vertex without intravenous contrast. RADIATION DOSE REDUCTION: This exam was performed according to the departmental dose-optimization program which includes automated exposure control, adjustment of the mA and/or kV according to patient size and/or use of iterative reconstruction technique. COMPARISON:  07/23/2022 FINDINGS: Brain: No evidence of acute infarction, hemorrhage, hydrocephalus, extra-axial collection or mass lesion/mass effect. Chronic moderate size infarct in the posterior left frontal cortex. Mild cerebral volume loss and chronic small vessel ischemia for age. Vascular: No hyperdense vessel or unexpected calcification. Skull: Normal.  Negative for fracture or focal lesion. Sinuses/Orbits: No acute finding. IMPRESSION: 1. No evidence of intracranial injury. 2. Chronic left frontal infarct. Electronically Signed   By: Tiburcio Pea M.D.   On: 03/31/2023 06:31    Procedures Procedures    Medications Ordered in ED Medications  HYDROcodone-acetaminophen (NORCO/VICODIN) 5-325 MG per tablet 1 tablet (has no administration in time range)    ED Course/ Medical Decision Making/ A&P                                 Medical Decision Making Amount and/or Complexity of Data Reviewed Radiology: ordered and independent interpretation performed. Decision-making details documented in ED Course.  Risk Prescription drug management.   Differential diagnosis considered includes, but not limited to: ICH; rib fracture; pulmonary contusion; hemo/pneumo-thorax  Presents to the emergency department with complaints of left-sided chest wall pain that started after he fell sometime this week.  Patient reports falling up against an object and having progressively worsening left-sided rib pain since the fall.  He is on Eliquis.  Patient also reportedly has a history of alcohol abuse.  For these reasons, patient had a head CT to rule out intracranial injury.  CT was unremarkable.  Patient underwent chest x-ray with left rib focus.  Patient does have 2 nondisplaced rib fractures.  This will be treated with analgesia.  X-ray also shows concerning changes that could represent lung mass.  Findings shared with patient and he is referred to pulmonology for further workup.        Final Clinical Impression(s) / ED Diagnoses Final diagnoses:  Closed fracture of multiple ribs of left side, initial encounter  Lung mass    Rx / DC Orders ED Discharge Orders          Ordered    Ambulatory referral to Pulmonology        03/31/23 0718    HYDROcodone-acetaminophen (NORCO/VICODIN) 5-325 MG tablet  Every 4 hours PRN        03/31/23 0720               Gilda Crease, MD 03/31/23 [phone]

## 2023-03-31 NOTE — ED Triage Notes (Signed)
Patient reports falling about 1 week ago, hit left side on edge of table, injuring left ribs side rib area. Patient reports the pain has worsened.Patient denies Hitting head, denies LOC. Patient able to ambulate from stretcher to bed. Patient has HX of alcohol dependency. Patient is on Eliquis.

## 2023-03-31 NOTE — Discharge Instructions (Signed)
You have 2 broken ribs on the left side.  Even for this is a pain medication and making sure you cough and breathe deeply so you do not get pneumonia.  The x-ray did show changes in your lungs that are concerning for possible tumor.  I am referring you to a lung specialist to further work this up to make sure you do not have lung cancer.

## 2023-04-04 ENCOUNTER — Other Ambulatory Visit: Payer: Self-pay | Admitting: Student

## 2023-04-04 DIAGNOSIS — R918 Other nonspecific abnormal finding of lung field: Secondary | ICD-10-CM

## 2023-04-16 ENCOUNTER — Encounter: Payer: Self-pay | Admitting: Student

## 2023-04-25 ENCOUNTER — Other Ambulatory Visit: Payer: Medicare HMO

## 2023-05-05 ENCOUNTER — Other Ambulatory Visit: Payer: Self-pay

## 2023-05-05 ENCOUNTER — Emergency Department (HOSPITAL_COMMUNITY): Payer: Medicare HMO

## 2023-05-05 ENCOUNTER — Inpatient Hospital Stay (HOSPITAL_COMMUNITY)
Admission: EM | Admit: 2023-05-05 | Discharge: 2023-05-08 | DRG: 350 | Disposition: A | Discharge status: Home / Self Care | Payer: Medicare HMO | Attending: Internal Medicine | Admitting: Internal Medicine

## 2023-05-05 ENCOUNTER — Encounter (HOSPITAL_COMMUNITY): Payer: Self-pay

## 2023-05-05 DIAGNOSIS — F17210 Nicotine dependence, cigarettes, uncomplicated: Secondary | ICD-10-CM | POA: Diagnosis present

## 2023-05-05 DIAGNOSIS — F1020 Alcohol dependence, uncomplicated: Secondary | ICD-10-CM | POA: Diagnosis present

## 2023-05-05 DIAGNOSIS — I10 Essential (primary) hypertension: Secondary | ICD-10-CM | POA: Diagnosis not present

## 2023-05-05 DIAGNOSIS — Z7951 Long term (current) use of inhaled steroids: Secondary | ICD-10-CM | POA: Diagnosis not present

## 2023-05-05 DIAGNOSIS — I429 Cardiomyopathy, unspecified: Secondary | ICD-10-CM | POA: Diagnosis present

## 2023-05-05 DIAGNOSIS — J4489 Other specified chronic obstructive pulmonary disease: Secondary | ICD-10-CM | POA: Diagnosis present

## 2023-05-05 DIAGNOSIS — I2510 Atherosclerotic heart disease of native coronary artery without angina pectoris: Secondary | ICD-10-CM | POA: Diagnosis present

## 2023-05-05 DIAGNOSIS — R7401 Elevation of levels of liver transaminase levels: Secondary | ICD-10-CM | POA: Diagnosis present

## 2023-05-05 DIAGNOSIS — G250 Essential tremor: Secondary | ICD-10-CM | POA: Diagnosis present

## 2023-05-05 DIAGNOSIS — I5042 Chronic combined systolic (congestive) and diastolic (congestive) heart failure: Secondary | ICD-10-CM | POA: Diagnosis present

## 2023-05-05 DIAGNOSIS — Z7901 Long term (current) use of anticoagulants: Secondary | ICD-10-CM

## 2023-05-05 DIAGNOSIS — N400 Enlarged prostate without lower urinary tract symptoms: Secondary | ICD-10-CM | POA: Diagnosis present

## 2023-05-05 DIAGNOSIS — I252 Old myocardial infarction: Secondary | ICD-10-CM

## 2023-05-05 DIAGNOSIS — K4000 Bilateral inguinal hernia, with obstruction, without gangrene, not specified as recurrent: Secondary | ICD-10-CM | POA: Diagnosis present

## 2023-05-05 DIAGNOSIS — Z79899 Other long term (current) drug therapy: Secondary | ICD-10-CM

## 2023-05-05 DIAGNOSIS — K4020 Bilateral inguinal hernia, without obstruction or gangrene, not specified as recurrent: Secondary | ICD-10-CM | POA: Diagnosis not present

## 2023-05-05 DIAGNOSIS — K5900 Constipation, unspecified: Secondary | ICD-10-CM | POA: Diagnosis present

## 2023-05-05 DIAGNOSIS — E785 Hyperlipidemia, unspecified: Secondary | ICD-10-CM | POA: Diagnosis present

## 2023-05-05 DIAGNOSIS — K760 Fatty (change of) liver, not elsewhere classified: Secondary | ICD-10-CM | POA: Diagnosis present

## 2023-05-05 DIAGNOSIS — I5021 Acute systolic (congestive) heart failure: Secondary | ICD-10-CM | POA: Diagnosis present

## 2023-05-05 DIAGNOSIS — Y901 Blood alcohol level of 20-39 mg/100 ml: Secondary | ICD-10-CM | POA: Diagnosis present

## 2023-05-05 DIAGNOSIS — Z5919 Other inadequate housing: Secondary | ICD-10-CM

## 2023-05-05 DIAGNOSIS — K4030 Unilateral inguinal hernia, with obstruction, without gangrene, not specified as recurrent: Principal | ICD-10-CM

## 2023-05-05 DIAGNOSIS — I5022 Chronic systolic (congestive) heart failure: Secondary | ICD-10-CM | POA: Diagnosis present

## 2023-05-05 DIAGNOSIS — K4090 Unilateral inguinal hernia, without obstruction or gangrene, not specified as recurrent: Secondary | ICD-10-CM

## 2023-05-05 DIAGNOSIS — Z91148 Patient's other noncompliance with medication regimen for other reason: Secondary | ICD-10-CM

## 2023-05-05 DIAGNOSIS — W57XXXA Bitten or stung by nonvenomous insect and other nonvenomous arthropods, initial encounter: Secondary | ICD-10-CM | POA: Diagnosis present

## 2023-05-05 DIAGNOSIS — I110 Hypertensive heart disease with heart failure: Secondary | ICD-10-CM | POA: Diagnosis present

## 2023-05-05 DIAGNOSIS — K4021 Bilateral inguinal hernia, without obstruction or gangrene, recurrent: Secondary | ICD-10-CM | POA: Diagnosis not present

## 2023-05-05 DIAGNOSIS — I480 Paroxysmal atrial fibrillation: Secondary | ICD-10-CM | POA: Diagnosis present

## 2023-05-05 DIAGNOSIS — F1029 Alcohol dependence with unspecified alcohol-induced disorder: Secondary | ICD-10-CM | POA: Diagnosis not present

## 2023-05-05 DIAGNOSIS — J449 Chronic obstructive pulmonary disease, unspecified: Secondary | ICD-10-CM | POA: Diagnosis present

## 2023-05-05 DIAGNOSIS — R222 Localized swelling, mass and lump, trunk: Secondary | ICD-10-CM | POA: Diagnosis not present

## 2023-05-05 DIAGNOSIS — I4891 Unspecified atrial fibrillation: Secondary | ICD-10-CM | POA: Diagnosis not present

## 2023-05-05 DIAGNOSIS — E876 Hypokalemia: Secondary | ICD-10-CM | POA: Diagnosis present

## 2023-05-05 DIAGNOSIS — Z8616 Personal history of COVID-19: Secondary | ICD-10-CM

## 2023-05-05 DIAGNOSIS — R251 Tremor, unspecified: Secondary | ICD-10-CM | POA: Diagnosis not present

## 2023-05-05 DIAGNOSIS — Z800 Family history of malignant neoplasm of digestive organs: Secondary | ICD-10-CM | POA: Diagnosis not present

## 2023-05-05 HISTORY — DX: Myocardial infarction type 2: I21.A1

## 2023-05-05 HISTORY — DX: Chronic diastolic (congestive) heart failure: I50.32

## 2023-05-05 HISTORY — DX: Disorientation, unspecified: R41.0

## 2023-05-05 HISTORY — DX: Paroxysmal atrial fibrillation: I48.0

## 2023-05-05 HISTORY — DX: Alcohol abuse, uncomplicated: F10.10

## 2023-05-05 HISTORY — DX: Tobacco use: Z72.0

## 2023-05-05 HISTORY — DX: Abnormal results of thyroid function studies: R94.6

## 2023-05-05 LAB — RAPID URINE DRUG SCREEN, HOSP PERFORMED
Amphetamines: NOT DETECTED
Barbiturates: NOT DETECTED
Benzodiazepines: NOT DETECTED
Cocaine: NOT DETECTED
Opiates: NOT DETECTED
Tetrahydrocannabinol: POSITIVE — AB

## 2023-05-05 LAB — CK: Total CK: 74 U/L (ref 49–397)

## 2023-05-05 LAB — CBC WITH DIFFERENTIAL/PLATELET
Abs Immature Granulocytes: 0.01 10*3/uL (ref 0.00–0.07)
Basophils Absolute: 0.1 10*3/uL (ref 0.0–0.1)
Basophils Relative: 1 %
Eosinophils Absolute: 0.1 10*3/uL (ref 0.0–0.5)
Eosinophils Relative: 2 %
HCT: 41 % (ref 39.0–52.0)
Hemoglobin: 13.7 g/dL (ref 13.0–17.0)
Immature Granulocytes: 0 %
Lymphocytes Relative: 32 %
Lymphs Abs: 1.6 10*3/uL (ref 0.7–4.0)
MCH: 33.7 pg (ref 26.0–34.0)
MCHC: 33.4 g/dL (ref 30.0–36.0)
MCV: 101 fL — ABNORMAL HIGH (ref 80.0–100.0)
Monocytes Absolute: 0.6 10*3/uL (ref 0.1–1.0)
Monocytes Relative: 12 %
Neutro Abs: 2.7 10*3/uL (ref 1.7–7.7)
Neutrophils Relative %: 53 %
Platelets: 286 10*3/uL (ref 150–400)
RBC: 4.06 MIL/uL — ABNORMAL LOW (ref 4.22–5.81)
RDW: 12.1 % (ref 11.5–15.5)
WBC: 5.1 10*3/uL (ref 4.0–10.5)
nRBC: 0 % (ref 0.0–0.2)

## 2023-05-05 LAB — MAGNESIUM: Magnesium: 1.7 mg/dL (ref 1.7–2.4)

## 2023-05-05 LAB — PROTIME-INR
INR: 1 (ref 0.8–1.2)
Prothrombin Time: 13.4 s (ref 11.4–15.2)

## 2023-05-05 LAB — TROPONIN I (HIGH SENSITIVITY): Troponin I (High Sensitivity): 8 ng/L (ref <18)

## 2023-05-05 LAB — URINALYSIS, ROUTINE W REFLEX MICROSCOPIC
Bacteria, UA: NONE SEEN
Bilirubin Urine: NEGATIVE
Glucose, UA: NEGATIVE mg/dL
Hgb urine dipstick: NEGATIVE
Ketones, ur: 20 mg/dL — AB
Nitrite: NEGATIVE
Protein, ur: NEGATIVE mg/dL
Specific Gravity, Urine: 1.018 (ref 1.005–1.030)
pH: 5 (ref 5.0–8.0)

## 2023-05-05 LAB — BASIC METABOLIC PANEL
Anion gap: 11 (ref 5–15)
BUN: 13 mg/dL (ref 8–23)
CO2: 25 mmol/L (ref 22–32)
Calcium: 9.1 mg/dL (ref 8.9–10.3)
Chloride: 102 mmol/L (ref 98–111)
Creatinine, Ser: 0.96 mg/dL (ref 0.61–1.24)
GFR, Estimated: >60 mL/min (ref >60)
Glucose, Bld: 78 mg/dL (ref 70–99)
Potassium: 3.8 mmol/L (ref 3.5–5.1)
Sodium: 138 mmol/L (ref 135–145)

## 2023-05-05 LAB — I-STAT CHEM 8, ED
BUN: 12 mg/dL (ref 8–23)
Calcium, Ion: 1.18 mmol/L (ref 1.15–1.40)
Chloride: 102 mmol/L (ref 98–111)
Creatinine, Ser: 1.1 mg/dL (ref 0.61–1.24)
Glucose, Bld: 71 mg/dL (ref 70–99)
HCT: 41 % (ref 39.0–52.0)
Hemoglobin: 13.9 g/dL (ref 13.0–17.0)
Potassium: 3.9 mmol/L (ref 3.5–5.1)
Sodium: 141 mmol/L (ref 135–145)
TCO2: 27 mmol/L (ref 22–32)

## 2023-05-05 LAB — HEPATIC FUNCTION PANEL
ALT: 49 U/L — ABNORMAL HIGH (ref 0–44)
AST: 71 U/L — ABNORMAL HIGH (ref 15–41)
Albumin: 3.8 g/dL (ref 3.5–5.0)
Alkaline Phosphatase: 110 U/L (ref 38–126)
Bilirubin, Direct: 0.2 mg/dL (ref 0.0–0.2)
Indirect Bilirubin: 1 mg/dL — ABNORMAL HIGH (ref 0.3–0.9)
Total Bilirubin: 1.2 mg/dL (ref 0.3–1.2)
Total Protein: 7.7 g/dL (ref 6.5–8.1)

## 2023-05-05 LAB — ETHANOL: Alcohol, Ethyl (B): <10 mg/dL (ref <10)

## 2023-05-05 LAB — LIPASE, BLOOD: Lipase: 31 U/L (ref 11–51)

## 2023-05-05 LAB — I-STAT CG4 LACTIC ACID, ED: Lactic Acid, Venous: 1.9 mmol/L (ref 0.5–1.9)

## 2023-05-05 LAB — AMMONIA: Ammonia: 22 umol/L (ref 9–35)

## 2023-05-05 LAB — PHOSPHORUS: Phosphorus: 2.7 mg/dL (ref 2.5–4.6)

## 2023-05-05 MED ORDER — FENTANYL CITRATE PF 50 MCG/ML IJ SOSY: 12.5000 ug | PREFILLED_SYRINGE | INTRAMUSCULAR | Status: DC | PRN

## 2023-05-05 MED ORDER — ONDANSETRON HCL 4 MG/2ML IJ SOLN: 4.0000 mg | Freq: Four times a day (QID) | INTRAMUSCULAR | Status: DC | PRN

## 2023-05-05 MED ORDER — LORAZEPAM 1 MG PO TABS: 1.0000 mg | ORAL_TABLET | ORAL | Status: DC | PRN

## 2023-05-05 MED ORDER — ATORVASTATIN CALCIUM 20 MG PO TABS: 20.0000 mg | ORAL_TABLET | Freq: Every day | ORAL | Status: DC

## 2023-05-05 MED ORDER — ONDANSETRON HCL 4 MG PO TABS: 4.0000 mg | ORAL_TABLET | Freq: Four times a day (QID) | ORAL | Status: DC | PRN

## 2023-05-05 MED ORDER — MOMETASONE FURO-FORMOTEROL FUM 200-5 MCG/ACT IN AERO
2.0000 | INHALATION_SPRAY | Freq: Two times a day (BID) | RESPIRATORY_TRACT | Status: DC
  Administered 2023-05-06 – 2023-05-07 (×3): 2 via RESPIRATORY_TRACT
  Filled 2023-05-05: qty 8.8

## 2023-05-05 MED ORDER — ALBUTEROL SULFATE (2.5 MG/3ML) 0.083% IN NEBU
2.5000 mg | INHALATION_SOLUTION | RESPIRATORY_TRACT | Status: DC | PRN
  Administered 2023-05-06: 2.5 mg via RESPIRATORY_TRACT
  Filled 2023-05-05: qty 3

## 2023-05-05 MED ORDER — ACETAMINOPHEN 650 MG RE SUPP: 650.0000 mg | Freq: Four times a day (QID) | RECTAL | Status: DC | PRN

## 2023-05-05 MED ORDER — THIAMINE MONONITRATE 100 MG PO TABS
100.0000 mg | ORAL_TABLET | Freq: Every day | ORAL | Status: DC
  Administered 2023-05-05 – 2023-05-08 (×3): 100 mg via ORAL
  Filled 2023-05-05 (×3): qty 1

## 2023-05-05 MED ORDER — NICOTINE 7 MG/24HR TD PT24
7.0000 mg | MEDICATED_PATCH | Freq: Every day | TRANSDERMAL | Status: DC
  Administered 2023-05-07: 7 mg via TRANSDERMAL
  Filled 2023-05-05 (×3): qty 1

## 2023-05-05 MED ORDER — HYDROMORPHONE HCL 1 MG/ML IJ SOLN
0.5000 mg | Freq: Once | INTRAMUSCULAR | Status: AC
  Administered 2023-05-05: 0.5 mg via INTRAVENOUS
  Filled 2023-05-05: qty 1

## 2023-05-05 MED ORDER — UMECLIDINIUM BROMIDE 62.5 MCG/ACT IN AEPB: 1.0000 | INHALATION_SPRAY | Freq: Every day | RESPIRATORY_TRACT | Status: DC

## 2023-05-05 MED ORDER — SENNA 8.6 MG PO TABS
1.0000 | ORAL_TABLET | Freq: Two times a day (BID) | ORAL | Status: DC
Start: 2023-05-05 — End: 2023-05-08
  Administered 2023-05-05 – 2023-05-08 (×5): 8.6 mg via ORAL
  Filled 2023-05-05 (×6): qty 1

## 2023-05-05 MED ORDER — FLUTICASONE FUROATE-VILANTEROL 200-25 MCG/ACT IN AEPB: 1.0000 | INHALATION_SPRAY | Freq: Every day | RESPIRATORY_TRACT | Status: DC

## 2023-05-05 MED ORDER — THIAMINE HCL 100 MG/ML IJ SOLN
100.0000 mg | Freq: Every day | INTRAMUSCULAR | Status: DC
  Administered 2023-05-06: 100 mg via INTRAVENOUS
  Filled 2023-05-05: qty 2

## 2023-05-05 MED ORDER — HYDROCODONE-ACETAMINOPHEN 5-325 MG PO TABS
1.0000 | ORAL_TABLET | ORAL | Status: DC | PRN
  Administered 2023-05-05: 2 via ORAL
  Administered 2023-05-06: 1 via ORAL
  Administered 2023-05-06 – 2023-05-07 (×2): 2 via ORAL
  Filled 2023-05-05 (×3): qty 2

## 2023-05-05 MED ORDER — METOPROLOL TARTRATE 25 MG PO TABS
12.5000 mg | ORAL_TABLET | Freq: Two times a day (BID) | ORAL | Status: DC
  Administered 2023-05-05 – 2023-05-06 (×2): 12.5 mg via ORAL
  Filled 2023-05-05 (×2): qty 1

## 2023-05-05 MED ORDER — IOHEXOL 300 MG/ML  SOLN
100.0000 mL | Freq: Once | INTRAMUSCULAR | Status: AC | PRN
  Administered 2023-05-05: 100 mL via INTRAVENOUS

## 2023-05-05 MED ORDER — ADULT MULTIVITAMIN W/MINERALS CH
1.0000 | ORAL_TABLET | Freq: Every day | ORAL | Status: DC
  Administered 2023-05-05 – 2023-05-08 (×3): 1 via ORAL
  Filled 2023-05-05 (×4): qty 1

## 2023-05-05 MED ORDER — SODIUM CHLORIDE 0.9 % IV SOLN: INTRAVENOUS | Status: AC

## 2023-05-05 MED ORDER — ACETAMINOPHEN 325 MG PO TABS: 650.0000 mg | ORAL_TABLET | Freq: Four times a day (QID) | ORAL | Status: DC | PRN

## 2023-05-05 MED ORDER — LORAZEPAM 2 MG/ML IJ SOLN: 1.0000 mg | INTRAMUSCULAR | Status: DC | PRN

## 2023-05-05 MED ORDER — DOCUSATE SODIUM 100 MG PO CAPS
100.0000 mg | ORAL_CAPSULE | Freq: Two times a day (BID) | ORAL | Status: DC
  Administered 2023-05-05 – 2023-05-08 (×5): 100 mg via ORAL
  Filled 2023-05-05 (×5): qty 1

## 2023-05-05 MED ORDER — FOLIC ACID 1 MG PO TABS
1.0000 mg | ORAL_TABLET | Freq: Every day | ORAL | Status: DC
  Administered 2023-05-05 – 2023-05-08 (×3): 1 mg via ORAL
  Filled 2023-05-05 (×4): qty 1

## 2023-05-05 MED ORDER — ALBUTEROL SULFATE (2.5 MG/3ML) 0.083% IN NEBU
2.5000 mg | INHALATION_SOLUTION | Freq: Once | RESPIRATORY_TRACT | Status: AC
  Administered 2023-05-05: 2.5 mg via RESPIRATORY_TRACT
  Filled 2023-05-05: qty 3

## 2023-05-05 MED ORDER — GUAIFENESIN ER 600 MG PO TB12
600.0000 mg | ORAL_TABLET | Freq: Two times a day (BID) | ORAL | Status: DC
  Administered 2023-05-05 – 2023-05-08 (×5): 600 mg via ORAL
  Filled 2023-05-05 (×5): qty 1

## 2023-05-05 NOTE — Assessment & Plan Note (Signed)
Hold diuretics for tonight and monitor fluid status

## 2023-05-05 NOTE — Consult Note (Signed)
Reason for Consult: Bilateral inguinal hernia Referring Physician: [name] is an 72 y.o. male.  HPI: 72 year old male with a past history of alcohol abuse, COPD and a chronic right inguinal hernia presents with right groin pain.  He was seen in July 2024 and CT scan showed a large right inguinal scrotal hernia.  He also has a small left inguinal hernia.  No nausea vomiting.  Last bowel movement 3 days ago.  No abdominal pain.  Asked to evaluate hernia.  Overall has poor personal hygiene and negligent to his healthcare  Past Medical History:  Diagnosis Date   Allergic rhinitis    Asthma    BPH (benign prostatic hyperplasia)    Carpal tunnel syndrome    Hypertension     No past surgical history on file.  Family History  Problem Relation Age of Onset   Colon cancer Father     Social History:  reports that he has been smoking cigarettes. He has never used smokeless tobacco. He reports current alcohol use. He reports that he does not use drugs.  Allergies: No Known Allergies  Medications: I have reviewed the patient's current medications.  Results for orders placed or performed during the hospital encounter of 05/05/23 (from the past 48 hour(s))  Lipase, blood     Status: None   Collection Time: 05/05/23  2:30 PM  Result Value Ref Range   Lipase 31 11 - 51 U/L    Comment: Performed at [facility], [address], [place], [place] [address]  CBC with Diff     Status: Abnormal   Collection Time: 05/05/23  2:30 PM  Result Value Ref Range   WBC 5.1 4.0 - 10.5 K/uL   RBC 4.06 (L) 4.22 - 5.81 MIL/uL   Hemoglobin 13.7 13.0 - 17.0 g/dL   HCT 66.0 63.0 - 16.0 %   MCV 101.0 (H) 80.0 - 100.0 fL   MCH 33.7 26.0 - 34.0 pg   MCHC 33.4 30.0 - 36.0 g/dL   RDW 10.9 32.3 - 55.7 %   Platelets 286 150 - 400 K/uL   nRBC 0.0 0.0 - 0.2 %   Neutrophils Relative % 53 %   Neutro Abs 2.7 1.7 - 7.7 K/uL   Lymphocytes Relative 32 %   Lymphs Abs 1.6 0.7 - 4.0 K/uL    Monocytes Relative 12 %   Monocytes Absolute 0.6 0.1 - 1.0 K/uL   Eosinophils Relative 2 %   Eosinophils Absolute 0.1 0.0 - 0.5 K/uL   Basophils Relative 1 %   Basophils Absolute 0.1 0.0 - 0.1 K/uL   Immature Granulocytes 0 %   Abs Immature Granulocytes 0.01 0.00 - 0.07 K/uL    Comment: Performed at [facility], [address] [place], [place] [address]  Hepatic function panel     Status: Abnormal   Collection Time: 05/05/23  2:30 PM  Result Value Ref Range   Total Protein 7.7 6.5 - 8.1 g/dL   Albumin 3.8 3.5 - 5.0 g/dL   AST 71 (H) 15 - 41 U/L   ALT 49 (H) 0 - 44 U/L   Alkaline Phosphatase 110 38 - 126 U/L   Total Bilirubin 1.2 0.3 - 1.2 mg/dL   Bilirubin, Direct 0.2 0.0 - 0.2 mg/dL   Indirect Bilirubin 1.0 (H) 0.3 - 0.9 mg/dL    Comment: Performed at [facility], [address] [place], [place] [address]  Basic metabolic panel  Status: None   Collection Time: 05/05/23  2:30 PM  Result Value Ref Range   Sodium 138 135 - 145 mmol/L   Potassium 3.8 3.5 - 5.1 mmol/L   Chloride 102 98 - 111 mmol/L   CO2 25 22 - 32 mmol/L   Glucose, Bld 78 70 - 99 mg/dL    Comment: Glucose reference range applies only to samples taken after fasting for at least 8 hours.   BUN 13 8 - 23 mg/dL   Creatinine, Ser 1.61 0.61 - 1.24 mg/dL   Calcium 9.1 8.9 - 09.6 mg/dL   GFR, Estimated >04 >54 mL/min    Comment: (NOTE) Calculated using the CKD-EPI Creatinine Equation (2021)    Anion gap 11 5 - 15    Comment: Performed at [facility], [address] [place], [place] [address]  Magnesium     Status: None   Collection Time: 05/05/23  2:30 PM  Result Value Ref Range   Magnesium 1.7 1.7 - 2.4 mg/dL    Comment: Performed at [facility], [address], [place], [place] [address]  I-Stat Chem 8, ED     Status: None   Collection Time: 05/05/23  2:44 PM  Result Value Ref Range   Sodium 141 135 - 145 mmol/L   Potassium  3.9 3.5 - 5.1 mmol/L   Chloride 102 98 - 111 mmol/L   BUN 12 8 - 23 mg/dL   Creatinine, Ser 2.95 0.61 - 1.24 mg/dL   Glucose, Bld 71 70 - 99 mg/dL    Comment: Glucose reference range applies only to samples taken after fasting for at least 8 hours.   Calcium, Ion 1.18 1.15 - 1.40 mmol/L   TCO2 27 22 - 32 mmol/L   Hemoglobin 13.9 13.0 - 17.0 g/dL   HCT 62.1 30.8 - 65.7 %  I-Stat Lactic Acid, ED     Status: None   Collection Time: 05/05/23  2:45 PM  Result Value Ref Range   Lactic Acid, Venous 1.9 0.5 - 1.9 mmol/L  Urinalysis, Routine w reflex microscopic -Urine, Clean Catch     Status: Abnormal   Collection Time: 05/05/23  3:00 PM  Result Value Ref Range   Color, Urine YELLOW YELLOW   APPearance CLEAR CLEAR   Specific Gravity, Urine 1.018 1.005 - 1.030   pH 5.0 5.0 - 8.0   Glucose, UA NEGATIVE NEGATIVE mg/dL   Hgb urine dipstick NEGATIVE NEGATIVE   Bilirubin Urine NEGATIVE NEGATIVE   Ketones, ur 20 (A) NEGATIVE mg/dL   Protein, ur NEGATIVE NEGATIVE mg/dL   Nitrite NEGATIVE NEGATIVE   Leukocytes,Ua TRACE (A) NEGATIVE   RBC / HPF 0-5 0 - 5 RBC/hpf   WBC, UA 11-20 0 - 5 WBC/hpf   Bacteria, UA NONE SEEN NONE SEEN   Squamous Epithelial / HPF 0-5 0 - 5 /HPF   Mucus PRESENT     Comment: Performed at [facility], [address], [place], [place] [address]    CT ABDOMEN PELVIS W CONTRAST  Result Date: 05/05/2023 CLINICAL DATA:  Right groin pain.  Known inguinal hernia. EXAM: CT ABDOMEN AND PELVIS WITH CONTRAST TECHNIQUE: Multidetector CT imaging of the abdomen and pelvis was performed using the standard protocol following bolus administration of intravenous contrast. RADIATION DOSE REDUCTION: This exam was performed according to the departmental dose-optimization program which includes automated exposure control, adjustment of the mA and/or kV according to patient size and/or use of iterative reconstruction technique. CONTRAST:   OMNIPAQUE IOHEXOL 300 MG/ML  SOLN  COMPARISON:  CT scan 01/15/2023 FINDINGS: Lower chest: The lung bases are clear of acute process. No pleural effusion or pulmonary lesions. The heart is normal in size. No pericardial effusion. The distal esophagus and aorta are unremarkable. Hepatobiliary: Diffuse fatty infiltration of the liver. 2 cm lesion in segment 4 B demonstrates peripheral nodular enhancement and is consistent with a benign hemangioma. Mild gallbladder contraction. No biliary dilatation. Pancreas: Unremarkable. No pancreatic ductal dilatation or surrounding inflammatory changes. Spleen: Normal in size without focal abnormality. Adrenals/Urinary Tract: Adrenal glands are unremarkable. Kidneys are normal, without renal calculi, focal lesion, or hydronephrosis. Bladder is unremarkable. Stomach/Bowel: The stomach, duodenum, small bowel and colon are unremarkable. No acute inflammatory changes, mass lesions obstructive findings. The terminal ileum and appendix are normal. Vascular/Lymphatic: Stable atherosclerotic calcifications involving the aorta and iliac arteries but no aneurysm or dissection. The major venous structures are patent. No mesenteric or retroperitoneal mass or adenopathy. Reproductive: The prostate gland and seminal vesicles are unremarkable. Other: Very large right inguinal hernia which contains the cecum, part of the ascending colon, the appendix and distal ileal small bowel loops. No findings for incarceration or obstruction. Small left inguinal hernia containing a small portion of the sigmoid colon but no findings for obstruction or incarceration. Musculoskeletal: No significant bony findings. Remote L1 compression fracture and stable degenerative changes involving the spine and hips. IMPRESSION: 1. Very large right inguinal hernia which contains the cecum, part of the ascending colon, the appendix and distal ileal small bowel loops. No findings for incarceration or obstruction. 2. Small left inguinal hernia containing a  small portion of the sigmoid colon but no findings for obstruction or incarceration. 3. Diffuse fatty infiltration of the liver. 4. 2 cm benign appearing hemangioma in segment 4B of the liver. Aortic Atherosclerosis (ICD10-I70.0). Electronically Signed   By: Rudie Meyer M.D.   On: 05/05/2023 18:50    Review of Systems  Constitutional: Negative.   HENT: Negative.    Eyes: Negative.   Respiratory:  Negative for shortness of breath and stridor.   Cardiovascular: Negative.   Gastrointestinal:  Negative for abdominal pain, nausea and vomiting.  Genitourinary: Negative.   Musculoskeletal: Negative.   Neurological: Negative.   Hematological: Negative.   Psychiatric/Behavioral: Negative.     Blood pressure (!) 173/97, pulse 73, temperature 97.6 F (36.4 C), resp. rate 17, SpO2 96%. Physical Exam HENT:     Head: Normocephalic.  Cardiovascular:     Rate and Rhythm: Normal rate.  Pulmonary:     Effort: Pulmonary effort is normal.  Abdominal:     Tenderness: There is no abdominal tenderness.     Hernia: A hernia is present. Hernia is present in the left inguinal area and right inguinal area. There is no hernia in the umbilical area or ventral area.       Comments: Large right chronic incarcerated inguinal hernia.  Soft nontender to palpation.  Musculoskeletal:     Cervical back: Normal range of motion.  Skin:    General: Skin is warm.  Neurological:     General: No focal deficit present.     Mental Status: He is alert.  Psychiatric:        Mood and Affect: Mood normal.     Assessment/Plan: Large right inguinal scrotal hernia-chronically incarcerated.  No evidence of strangulation.  No evidence of bowel obstruction.  He is not compliant he may be best to have this repaired while he is here.  He has  an interest in getting it repaired.  CT shows a left inguinal hernia as well.  Primary service around in a.m. to determine timing of surgery.  Due to increasing pain, recommend repair with  admission.  History of multiple medical problems and of asked medicine to assist.  Patient can eat.  N.p.o. after midnight.  He is a poor historian and appears to not take good care of himself.  He has not been reliable for follow-up of this large hernia.  Clovis Pu Alessa Mazur MD  05/05/2023, 7:31 PM

## 2023-05-05 NOTE — Assessment & Plan Note (Signed)
Continue to hold Eliquis as patient will likely need surgical interventions.  Low-dose beta-blocker metoprolol 12.5 twice daily and monitor heart rate/blood pressure 80s unclear if patient has been taking his home blood pressure meds

## 2023-05-05 NOTE — ED Notes (Signed)
Pt has been cleaned and changed into blue paper scrubs.

## 2023-05-05 NOTE — Assessment & Plan Note (Signed)
Order CIWA protocol  Encourage alcohol cessation

## 2023-05-05 NOTE — Assessment & Plan Note (Signed)
Contact precautions °

## 2023-05-05 NOTE — Assessment & Plan Note (Signed)
Appreciate general surgery consult Npo post midnight

## 2023-05-05 NOTE — ED Notes (Signed)
ED TO INPATIENT HANDOFF REPORT  ED Nurse Name and Phone #: Dalia Heading RN  S Name/Age/Gender Mark [name] 72 y.o. male Room/Bed: WA01/WA01  Code Status   Code Status: Prior  Home/SNF/Other Home Patient oriented to: self, place, time, and situation Is this baseline? Yes   Triage Complete: Triage complete  Chief Complaint Inguinal hernia [K40.90]  Triage Note Patient BIB EMS for abdominal pain and constipation, complains of pain in groin area from hernia.    Allergies No Known Allergies  Level of Care/Admitting Diagnosis ED Disposition     ED Disposition  Admit   Condition  --   Comment  Hospital Area: [facility] [[id number]]  Level of Care: Progressive [102]  Admit to Progressive based on following criteria: NEUROLOGICAL AND NEUROSURGICAL complex patients with significant risk of instability, who do not meet ICU criteria, yet require close observation or frequent assessment (< / = every 2 - 4 hours) with medical / nursing intervention.  May admit patient to [facility] or [facility] if equivalent level of care is available:: No  Covid Evaluation: Asymptomatic - no recent exposure (last 10 days) testing not required  Diagnosis: Inguinal hernia [782956]  Admitting Physician: Therisa Doyne [[id number]]  Attending Physician: Therisa Doyne [[id number]]  Certification:: I certify this patient will need inpatient services for at least 2 midnights  Expected Medical Readiness: 05/07/2023          B Medical/Surgery History Past Medical History:  Diagnosis Date   Allergic rhinitis    Asthma    BPH (benign prostatic hyperplasia)    Carpal tunnel syndrome    Hypertension    No past surgical history on file.   A IV Location/Drains/Wounds Patient Lines/Drains/Airways Status     Active Line/Drains/Airways     Name Placement date Placement time Site Days   Peripheral IV 05/05/23 20 G 1" Right Antecubital 05/05/23  1438  Antecubital  less than  1            Intake/Output Last 24 hours No intake or output data in the 24 hours ending 05/05/23 2007  Labs/Imaging Results for orders placed or performed during the hospital encounter of 05/05/23 (from the past 48 hour(s))  Lipase, blood     Status: None   Collection Time: 05/05/23  2:30 PM  Result Value Ref Range   Lipase 31 11 - 51 U/L    Comment: Performed at [facility], [address], [place], [place] [address]  CBC with Diff     Status: Abnormal   Collection Time: 05/05/23  2:30 PM  Result Value Ref Range   WBC 5.1 4.0 - 10.5 K/uL   RBC 4.06 (L) 4.22 - 5.81 MIL/uL   Hemoglobin 13.7 13.0 - 17.0 g/dL   HCT 65.7 84.6 - 96.2 %   MCV 101.0 (H) 80.0 - 100.0 fL   MCH 33.7 26.0 - 34.0 pg   MCHC 33.4 30.0 - 36.0 g/dL   RDW 95.2 84.1 - 32.4 %   Platelets 286 150 - 400 K/uL   nRBC 0.0 0.0 - 0.2 %   Neutrophils Relative % 53 %   Neutro Abs 2.7 1.7 - 7.7 K/uL   Lymphocytes Relative 32 %   Lymphs Abs 1.6 0.7 - 4.0 K/uL   Monocytes Relative 12 %   Monocytes Absolute 0.6 0.1 - 1.0 K/uL   Eosinophils Relative 2 %   Eosinophils Absolute 0.1 0.0 - 0.5 K/uL   Basophils Relative 1 %  Basophils Absolute 0.1 0.0 - 0.1 K/uL   Immature Granulocytes 0 %   Abs Immature Granulocytes 0.01 0.00 - 0.07 K/uL    Comment: Performed at [facility], [address] [place], [place] [address]  Hepatic function panel     Status: Abnormal   Collection Time: 05/05/23  2:30 PM  Result Value Ref Range   Total Protein 7.7 6.5 - 8.1 g/dL   Albumin 3.8 3.5 - 5.0 g/dL   AST 71 (H) 15 - 41 U/L   ALT 49 (H) 0 - 44 U/L   Alkaline Phosphatase 110 38 - 126 U/L   Total Bilirubin 1.2 0.3 - 1.2 mg/dL   Bilirubin, Direct 0.2 0.0 - 0.2 mg/dL   Indirect Bilirubin 1.0 (H) 0.3 - 0.9 mg/dL    Comment: Performed at [facility], [address] [place], [place] [address]  Basic metabolic panel     Status: None   Collection Time: 05/05/23  2:30 PM   Result Value Ref Range   Sodium 138 135 - 145 mmol/L   Potassium 3.8 3.5 - 5.1 mmol/L   Chloride 102 98 - 111 mmol/L   CO2 25 22 - 32 mmol/L   Glucose, Bld 78 70 - 99 mg/dL    Comment: Glucose reference range applies only to samples taken after fasting for at least 8 hours.   BUN 13 8 - 23 mg/dL   Creatinine, Ser 0.10 0.61 - 1.24 mg/dL   Calcium 9.1 8.9 - 27.2 mg/dL   GFR, Estimated >53 >66 mL/min    Comment: (NOTE) Calculated using the CKD-EPI Creatinine Equation (2021)    Anion gap 11 5 - 15    Comment: Performed at [facility], [address], [place], [place] [address]  Magnesium     Status: None   Collection Time: 05/05/23  2:30 PM  Result Value Ref Range   Magnesium 1.7 1.7 - 2.4 mg/dL    Comment: Performed at [facility], [address] [place], [place] [address]  I-Stat Chem 8, ED     Status: None   Collection Time: 05/05/23  2:44 PM  Result Value Ref Range   Sodium 141 135 - 145 mmol/L   Potassium 3.9 3.5 - 5.1 mmol/L   Chloride 102 98 - 111 mmol/L   BUN 12 8 - 23 mg/dL   Creatinine, Ser 5.63 0.61 - 1.24 mg/dL   Glucose, Bld 71 70 - 99 mg/dL    Comment: Glucose reference range applies only to samples taken after fasting for at least 8 hours.   Calcium, Ion 1.18 1.15 - 1.40 mmol/L   TCO2 27 22 - 32 mmol/L   Hemoglobin 13.9 13.0 - 17.0 g/dL   HCT 87.5 64.3 - 32.9 %  I-Stat Lactic Acid, ED     Status: None   Collection Time: 05/05/23  2:45 PM  Result Value Ref Range   Lactic Acid, Venous 1.9 0.5 - 1.9 mmol/L  Urinalysis, Routine w reflex microscopic -Urine, Clean Catch     Status: Abnormal   Collection Time: 05/05/23  3:00 PM  Result Value Ref Range   Color, Urine YELLOW YELLOW   APPearance CLEAR CLEAR   Specific Gravity, Urine 1.018 1.005 - 1.030   pH 5.0 5.0 - 8.0   Glucose, UA NEGATIVE NEGATIVE mg/dL   Hgb urine dipstick NEGATIVE NEGATIVE   Bilirubin Urine NEGATIVE NEGATIVE   Ketones, ur 20 (A) NEGATIVE mg/dL    Protein, ur  NEGATIVE NEGATIVE mg/dL   Nitrite NEGATIVE NEGATIVE   Leukocytes,Ua TRACE (A) NEGATIVE   RBC / HPF 0-5 0 - 5 RBC/hpf   WBC, UA 11-20 0 - 5 WBC/hpf   Bacteria, UA NONE SEEN NONE SEEN   Squamous Epithelial / HPF 0-5 0 - 5 /HPF   Mucus PRESENT     Comment: Performed at [facility], [address] [place], [place] [address]   CT ABDOMEN PELVIS W CONTRAST  Result Date: 05/05/2023 CLINICAL DATA:  Right groin pain.  Known inguinal hernia. EXAM: CT ABDOMEN AND PELVIS WITH CONTRAST TECHNIQUE: Multidetector CT imaging of the abdomen and pelvis was performed using the standard protocol following bolus administration of intravenous contrast. RADIATION DOSE REDUCTION: This exam was performed according to the departmental dose-optimization program which includes automated exposure control, adjustment of the mA and/or kV according to patient size and/or use of iterative reconstruction technique. CONTRAST:  OMNIPAQUE IOHEXOL 300 MG/ML  SOLN COMPARISON:  CT scan 01/15/2023 FINDINGS: Lower chest: The lung bases are clear of acute process. No pleural effusion or pulmonary lesions. The heart is normal in size. No pericardial effusion. The distal esophagus and aorta are unremarkable. Hepatobiliary: Diffuse fatty infiltration of the liver. 2 cm lesion in segment 4 B demonstrates peripheral nodular enhancement and is consistent with a benign hemangioma. Mild gallbladder contraction. No biliary dilatation. Pancreas: Unremarkable. No pancreatic ductal dilatation or surrounding inflammatory changes. Spleen: Normal in size without focal abnormality. Adrenals/Urinary Tract: Adrenal glands are unremarkable. Kidneys are normal, without renal calculi, focal lesion, or hydronephrosis. Bladder is unremarkable. Stomach/Bowel: The stomach, duodenum, small bowel and colon are unremarkable. No acute inflammatory changes, mass lesions obstructive findings. The terminal ileum and appendix are normal.  Vascular/Lymphatic: Stable atherosclerotic calcifications involving the aorta and iliac arteries but no aneurysm or dissection. The major venous structures are patent. No mesenteric or retroperitoneal mass or adenopathy. Reproductive: The prostate gland and seminal vesicles are unremarkable. Other: Very large right inguinal hernia which contains the cecum, part of the ascending colon, the appendix and distal ileal small bowel loops. No findings for incarceration or obstruction. Small left inguinal hernia containing a small portion of the sigmoid colon but no findings for obstruction or incarceration. Musculoskeletal: No significant bony findings. Remote L1 compression fracture and stable degenerative changes involving the spine and hips. IMPRESSION: 1. Very large right inguinal hernia which contains the cecum, part of the ascending colon, the appendix and distal ileal small bowel loops. No findings for incarceration or obstruction. 2. Small left inguinal hernia containing a small portion of the sigmoid colon but no findings for obstruction or incarceration. 3. Diffuse fatty infiltration of the liver. 4. 2 cm benign appearing hemangioma in segment 4B of the liver. Aortic Atherosclerosis (ICD10-I70.0). Electronically Signed   By: Rudie Meyer M.D.   On: 05/05/2023 18:50    Pending Labs Unresulted Labs (From admission, onward)     Start     Ordered   05/06/23 0500  Vitamin B12  Tomorrow morning,   R        05/05/23 1938   05/06/23 0500  Folate  Tomorrow morning,   R        05/05/23 1938   05/05/23 1939  Rapid urine drug screen (hospital performed)  ONCE - STAT,   STAT        05/05/23 1938   05/05/23 1939  Ethanol  Add-on,   AD        05/05/23 1938   05/05/23 1939  Vitamin B1  Add-on,   AD        05/05/23 1938   05/05/23 1939  Ammonia  Once,   R       Question:  Release to patient  Answer:  Immediate   05/05/23 1938   05/05/23 1939  CK  Add-on,   AD        05/05/23 1938   05/05/23 1939  Phosphorus   Add-on,   AD        05/05/23 1938   05/05/23 1915  Protime-INR  Once,   STAT        05/05/23 1914            Vitals/Pain Today's Vitals   05/05/23 1530 05/05/23 1700 05/05/23 1807 05/05/23 1937  BP: (!) 176/93  (!) 173/97   Pulse: 70  73   Resp: 16  17   Temp:  97.6 F (36.4 C) 97.6 F (36.4 C)   SpO2: 99%  96%   PainSc:    8     Isolation Precautions No active isolations  Medications Medications  LORazepam (ATIVAN) tablet 1-4 mg (has no administration in time range)    Or  LORazepam (ATIVAN) injection 1-4 mg (has no administration in time range)  thiamine (VITAMIN B1) tablet 100 mg (100 mg Oral Given 05/05/23 1934)    Or  thiamine (VITAMIN B1) injection 100 mg ( Intravenous See Alternative 05/05/23 1934)  folic acid (FOLVITE) tablet 1 mg (1 mg Oral Given 05/05/23 1934)  multivitamin with minerals tablet 1 tablet (1 tablet Oral Given 05/05/23 1934)  HYDROmorphone (DILAUDID) injection 0.5 mg (0.5 mg Intravenous Given 05/05/23 1650)  albuterol (PROVENTIL) (2.5 MG/3ML) 0.083% nebulizer solution 2.5 mg (2.5 mg Nebulization Given 05/05/23 1651)  iohexol (OMNIPAQUE) 300 MG/ML solution 100 mL (100 mLs Intravenous Contrast Given 05/05/23 1814)    Mobility walks with device     Focused Assessments    R Recommendations: See Admitting Provider Note  Report given to:   Additional Notes: Bed Bugs were found on patient. Pt has been cleaned and scrubs put on.

## 2023-05-05 NOTE — ED Provider Notes (Signed)
[name] [facility]ERGENCY DEPARTMENT AT [facility] Provider Note   CSN: [id number] Arrival date & time: 05/05/23  1350     History  No chief complaint on file.   Mark [name] is a 72 y.o. male.  HPI Patient presents for right swelling and constipation.  Medical history includes alcohol abuse, COPD, atrial fibrillation, BPH.  He has a pre-existing inguinal hernia.  Typically, this will cause him intermittent discomfort.  Recently, it has increased in size, become more painful, and he has not been able to have a bowel movement in the past 3 days.  Typically, he is regular every morning with his bowel movements.  He has tried a stool softener without relief.  He denies any abdominal pain or nausea.  He has not had any trouble urinating.  He has had some recent shortness of breath which he attributes to his asthma/COPD.  He does treat this with a rescue inhaler.  Currently, he denies shortness of breath.  He has also had chronic right-sided weakness with intermittent falls.  He denies any recent falls.    Home Medications Prior to Admission medications   Medication Sig Start Date End Date Taking? Authorizing Provider  albuterol (PROVENTIL HFA;VENTOLIN HFA) 108 (90 Base) MCG/ACT inhaler Inhale 2 puffs into the lungs every 4 (four) hours as needed for wheezing or shortness of breath. 07/03/16   Horton, Mayer Masker, MD  apixaban (ELIQUIS) 5 MG TABS tablet Take 1 tablet (5 mg total) by mouth 2 (two) times daily. 03/23/22 06/21/22  Darlin Drop, DO  atorvastatin (LIPITOR) 20 MG tablet Take 20 mg by mouth daily. 01/03/22   [provider]  benzonatate (TESSALON) 100 MG capsule Take 1 capsule (100 mg total) by mouth every 8 (eight) hours. 07/23/22   Garlon Hatchet, PA-C  diltiazem (CARDIZEM CD) 360 MG 24 hr capsule Take 1 capsule (360 mg total) by mouth daily. 03/24/22 04/23/22  Darlin Drop, DO  furosemide (LASIX) 20 MG tablet Take 1 tablet (20 mg total) by mouth daily. 03/24/22  04/23/22  Darlin Drop, DO  HYDROcodone-acetaminophen (NORCO/VICODIN) 5-325 MG tablet Take 1-2 tablets by mouth every 4 (four) hours as needed for moderate pain. 03/31/23   Gilda Crease, MD  hydrOXYzine (ATARAX) 10 MG tablet Take 10-20 mg by mouth at bedtime. 02/19/22   [provider]  metoprolol succinate (TOPROL-XL) 100 MG 24 hr tablet Take 1 tablet (100 mg total) by mouth daily. Take with or immediately following a meal. 03/24/22 04/23/22  Darlin Drop, DO  Multiple Vitamin (MULTIVITAMIN WITH MINERALS) TABS tablet Take 1 tablet by mouth daily.    [provider]  nicotine (NICODERM CQ - DOSED IN MG/24 HR) 7 mg/24hr patch Place 1 patch (7 mg total) onto the skin daily. 03/24/22   Darlin Drop, DO  omeprazole (PRILOSEC) 20 MG capsule Take 1 capsule (20 mg total) by mouth daily. Patient taking differently: Take 20 mg by mouth daily as needed (acid reflux). 10/28/19   Tegeler, Canary Brim, MD  predniSONE (DELTASONE) 10 MG tablet Take 2 tablets (20 mg total) by mouth daily. 07/23/22   Garlon Hatchet, PA-C  SYMBICORT 160-4.5 MCG/ACT inhaler Inhale 2 puffs into the lungs daily.  10/14/19   [provider]  Dwyane Luo 200-62.5-25 MCG/ACT AEPB Inhale 1 puff into the lungs daily. 02/20/22   [provider]      Allergies    Patient has no known allergies.    Review of Systems  Review of Systems  Respiratory:  Positive for shortness of breath.   Gastrointestinal:  Positive for constipation.  Genitourinary:  Positive for scrotal swelling.  All other systems reviewed and are negative.   Physical Exam Updated Vital Signs BP (!) 173/97   Pulse 73   Temp 97.6 F (36.4 C)   Resp 17   SpO2 96%  Physical Exam Vitals and nursing note reviewed.  Constitutional:      General: He is not in acute distress.    Appearance: Normal appearance. He is well-developed. He is not ill-appearing, toxic-appearing or diaphoretic.  HENT:     Head: Normocephalic  and atraumatic.     Right Ear: External ear normal.     Left Ear: External ear normal.     Nose: Nose normal.     Mouth/Throat:     Mouth: Mucous membranes are moist.  Eyes:     Extraocular Movements: Extraocular movements intact.     Conjunctiva/sclera: Conjunctivae normal.  Cardiovascular:     Rate and Rhythm: Normal rate and regular rhythm.     Heart sounds: No murmur heard. Pulmonary:     Effort: Pulmonary effort is normal. No respiratory distress.     Breath sounds: Wheezing present. No rhonchi or rales.  Chest:     Chest wall: No tenderness.  Abdominal:     General: There is no distension.     Palpations: Abdomen is soft.     Tenderness: There is no abdominal tenderness.  Genitourinary:    Comments: Large right-sided inguinal hernia with firm scrotal swelling. Musculoskeletal:        General: No swelling.     Cervical back: Normal range of motion and neck supple.  Skin:    General: Skin is warm and dry.     Coloration: Skin is not jaundiced or pale.  Neurological:     General: No focal deficit present.     Mental Status: He is alert and oriented to person, place, and time.     Motor: Tremor (Right hand) present.  Psychiatric:        Mood and Affect: Mood normal.        Behavior: Behavior normal.     ED Results / Procedures / Treatments   Labs (all labs ordered are listed, but only abnormal results are displayed) Labs Reviewed  CBC WITH DIFFERENTIAL/PLATELET - Abnormal; Notable for the following components:      Result Value   RBC 4.06 (*)    MCV 101.0 (*)    All other components within normal limits  URINALYSIS, ROUTINE W REFLEX MICROSCOPIC - Abnormal; Notable for the following components:   Ketones, ur 20 (*)    Leukocytes,Ua TRACE (*)    All other components within normal limits  HEPATIC FUNCTION PANEL - Abnormal; Notable for the following components:   AST 71 (*)    ALT 49 (*)    Indirect Bilirubin 1.0 (*)    All other components within normal limits   LIPASE, BLOOD  BASIC METABOLIC PANEL  MAGNESIUM  I-STAT CHEM 8, ED  I-STAT CG4 LACTIC ACID, ED    EKG EKG Interpretation Date/Time:  Sunday May 05 2023 15:12:53 EDT Ventricular Rate:  70 PR Interval:  125 QRS Duration:  109 QT Interval:  431 QTC Calculation: 466 R Axis:   -65  Text Interpretation: Sinus rhythm LAD, consider left anterior fascicular block Consider anterior infarct Borderline T abnormalities, inferior leads Confirmed by Gloris Manchester [phone]) on 05/05/2023 6:30:25 PM  Radiology  CT ABDOMEN PELVIS W CONTRAST  Result Date: 05/05/2023 CLINICAL DATA:  Right groin pain.  Known inguinal hernia. EXAM: CT ABDOMEN AND PELVIS WITH CONTRAST TECHNIQUE: Multidetector CT imaging of the abdomen and pelvis was performed using the standard protocol following bolus administration of intravenous contrast. RADIATION DOSE REDUCTION: This exam was performed according to the departmental dose-optimization program which includes automated exposure control, adjustment of the mA and/or kV according to patient size and/or use of iterative reconstruction technique. CONTRAST:  OMNIPAQUE IOHEXOL 300 MG/ML  SOLN COMPARISON:  CT scan 01/15/2023 FINDINGS: Lower chest: The lung bases are clear of acute process. No pleural effusion or pulmonary lesions. The heart is normal in size. No pericardial effusion. The distal esophagus and aorta are unremarkable. Hepatobiliary: Diffuse fatty infiltration of the liver. 2 cm lesion in segment 4 B demonstrates peripheral nodular enhancement and is consistent with a benign hemangioma. Mild gallbladder contraction. No biliary dilatation. Pancreas: Unremarkable. No pancreatic ductal dilatation or surrounding inflammatory changes. Spleen: Normal in size without focal abnormality. Adrenals/Urinary Tract: Adrenal glands are unremarkable. Kidneys are normal, without renal calculi, focal lesion, or hydronephrosis. Bladder is unremarkable. Stomach/Bowel: The stomach, duodenum,  small bowel and colon are unremarkable. No acute inflammatory changes, mass lesions obstructive findings. The terminal ileum and appendix are normal. Vascular/Lymphatic: Stable atherosclerotic calcifications involving the aorta and iliac arteries but no aneurysm or dissection. The major venous structures are patent. No mesenteric or retroperitoneal mass or adenopathy. Reproductive: The prostate gland and seminal vesicles are unremarkable. Other: Very large right inguinal hernia which contains the cecum, part of the ascending colon, the appendix and distal ileal small bowel loops. No findings for incarceration or obstruction. Small left inguinal hernia containing a small portion of the sigmoid colon but no findings for obstruction or incarceration. Musculoskeletal: No significant bony findings. Remote L1 compression fracture and stable degenerative changes involving the spine and hips. IMPRESSION: 1. Very large right inguinal hernia which contains the cecum, part of the ascending colon, the appendix and distal ileal small bowel loops. No findings for incarceration or obstruction. 2. Small left inguinal hernia containing a small portion of the sigmoid colon but no findings for obstruction or incarceration. 3. Diffuse fatty infiltration of the liver. 4. 2 cm benign appearing hemangioma in segment 4B of the liver. Aortic Atherosclerosis (ICD10-I70.0). Electronically Signed   By: Rudie Meyer M.D.   On: 05/05/2023 18:50    Procedures Procedures    Medications Ordered in ED Medications  HYDROmorphone (DILAUDID) injection 0.5 mg (0.5 mg Intravenous Given 05/05/23 1650)  albuterol (PROVENTIL) (2.5 MG/3ML) 0.083% nebulizer solution 2.5 mg (2.5 mg Nebulization Given 05/05/23 1651)  iohexol (OMNIPAQUE) 300 MG/ML solution 100 mL (100 mLs Intravenous Contrast Given 05/05/23 1814)    ED Course/ Medical Decision Making/ A&P                                 Medical Decision Making Amount and/or Complexity of Data  Reviewed Labs: ordered. Radiology: ordered.  Risk Prescription drug management.   This patient presents to the ED for concern of groin swelling and pain, this involves an extensive number of treatment options, and is a complaint that carries with it a high risk of complications and morbidity.  The differential diagnosis includes incarcerated hernia, strangulated hernia, bowel obstruction   Co morbidities that complicate the patient evaluation  alcohol abuse, COPD, atrial fibrillation, BPH   Additional history obtained:  Additional history obtained  from N/A External records from outside source obtained and reviewed including EMR   Lab Tests:  I Ordered, and personally interpreted labs.  The pertinent results include: Normal hemoglobin, no leukocytosis, normal kidney function, normal electrolytes   Imaging Studies ordered:  I ordered imaging studies including CT of abdomen and pelvis I independently visualized and interpreted imaging which showed large right inguinal hernia containing cecum, colon, appendix, and small bowel loops, small left inguinal hernia containing small portion of sigmoid colon I agree with the radiologist interpretation   Cardiac Monitoring: / EKG:  The patient was maintained on a cardiac monitor.  I personally viewed and interpreted the cardiac monitored which showed an underlying rhythm of: Sinus rhythm   Consultations Obtained:  I requested consultation with the general surgeon, Dr. Luisa Hart,  and discussed lab and imaging findings as well as pertinent plan - they recommend: Admission to medicine.  Surgery will see in consult.   Problem List / ED Course / Critical interventions / Medication management  Patient presents for constipation for the past 3 days.  This is in the setting of size and worsening pain of his pre-existing right sided inguinal hernia.  On exam, patient is overall well-appearing.  He has not had any abdominal pain or nausea.   Abdomen is soft and nondistended.  GU exam was performed with nurse chaperone present.  He has a right-sided inguinal hernia with firm hernia contents within his scrotum.  There is concern of incarcerated hernia.  Dilaudid was ordered for analgesia.  Will obtain CT imaging.  Exam is also notable for expiratory wheezing on lung auscultation.  His current breathing is unlabored and he is able to speak in complete sentences.  Nebulized breathing treatment was ordered for further comfort.  Patient's lab work is unremarkable.  CT imaging shows very large right inguinal hernia containing colon, appendix, and small bowel loops in addition to a small left-sided inguinal hernia.  Given irreducibility, I spoke with general surgeon on-call, Dr. Luisa Hart, who will see the patient in consult.  He advised admission to medicine.  Patient is prescribed Eliquis but states that he has not taken in the past 2 days.  In terms of his drinking, he does drink beer every day.  Last beer was last night.  He denies any history of severe alcohol withdrawal.  Currently, his pain has improved.  Patient was admitted for further management. I ordered medication including Dilaudid for analgesia; albuterol for COPD Reevaluation of the patient after these medicines showed that the patient improved I have reviewed the patients home medicines and have made adjustments as needed   Social Determinants of Health:  Ongoing alcohol use        Final Clinical Impression(s) / ED Diagnoses Final diagnoses:  Irreducible inguinal hernia    Rx / DC Orders ED Discharge Orders     None         Gloris Manchester, MD 05/05/23 1911

## 2023-05-05 NOTE — ED Notes (Signed)
All of pts personal items have been bagged properly.

## 2023-05-05 NOTE — Plan of Care (Signed)
  Problem: Activity: Goal: Risk for activity intolerance will decrease Outcome: Progressing   Problem: Nutrition: Goal: Adequate nutrition will be maintained Outcome: Progressing   Problem: Coping: Goal: Level of anxiety will decrease Outcome: Progressing   Problem: Elimination: Goal: Will not experience complications related to urinary retention Outcome: Progressing   

## 2023-05-05 NOTE — H&P (Signed)
Mark [name] UJW:[id number] DOB: 11-21-1950 DOA: 05/05/2023     PCP: Hillery Aldo, NP   Outpatient Specialists:  CARDS:  Dr. Jodelle Red, MD   Patient arrived to ER on 05/05/23 at 1350 Referred by Attending Therisa Doyne, MD   Patient coming from:    home Lives alone,     Chief Complaint: constipation   HPI: Mark [name] is a 72 y.o. male with medical history significant of  asthma, COPD, asthma, Etoh Abuse, tobacco abuse, bilateral inguinal hernia, a.fib on Eliquis, systolic CHF    Presented with constipation  3 day hx of Constipation came to ER  Has hx of inguinal hernia but has been getting a bit bigger  More pain  Hx of COPD/Asthma Reports daily EtOH use but no hx of DTs has chronic essential tremmor worse in right hand Denies any fever no chills  Has chronic SOB and cough due to COPD/ashtma Still smokes on occasion No nausea vomiting.     Has not had his Eliquis for the past 2 days   significant ETOH intake  but denies ETOH withdrawal  Smokes  but interested in quitting   Lab Results  Component Value Date   SARSCOV2NAA POSITIVE (A) 07/22/2022   SARSCOV2NAA NEGATIVE 03/09/2022   SARSCOV2NAA NEGATIVE 11/10/2019   SARSCOV2NAA NEGATIVE 09/09/2019        Regarding pertinent Chronic problems:    Hyperlipidemia - on statins Lipitor (atorvastatin)     chronic CHF  systolic  - last echo  Recent Results (from the past 56213 hour(s))  ECHOCARDIOGRAM COMPLETE   Collection Time: 03/11/22  9:15 AM  Result Value   Weight 2,790.14   Height 74   BP 127/61   S' Lateral 3.00   Narrative      ECHOCARDIOGRAM REPORT       Patient Name:   Mark [name] Date of Exam: 03/11/2022 Medical Rec #:  [id number]       Height:       74.0 in Accession #:    [id number]      Weight:       174.4 lb Date of Birth:  1950-12-03       BSA:          2.050 m Patient Age:    71 years        BP:           131/81 mmHg Patient Gender: M               HR:            117 bpm. Exam Location:  Inpatient  Procedure: 2D Echo, Cardiac Doppler, Color Doppler and Intracardiac            Opacification Agent  Indications:    Elevated troponin   History:        Patient has no prior history of Echocardiogram examinations.                 Signs/Symptoms:Shortness of Breath. Atrial fibrillation with                 RVR.   Sonographer:    Roosvelt Maser RDCS Referring Phys: Ramiro Harvest, V  IMPRESSIONS    1. Abnormal septal motion . Left ventricular ejection fraction, by estimation, is 50 to 55%. The left ventricle has low normal function. The left ventricle has no regional wall motion abnormalities. Left ventricular diastolic parameters are  indeterminate.  2. Right ventricular systolic function is moderately  reduced. The right ventricular size is moderately enlarged.  3. The mitral valve is abnormal. Trivial mitral valve regurgitation. No evidence of mitral stenosis.  4. The aortic valve is tricuspid. There is mild calcification of the aortic valve. There is mild thickening of the aortic valve. Aortic valve regurgitation is not visualized. Aortic valve sclerosis is present, with no evidence of aortic valve stenosis.  5. The inferior vena cava is dilated in size with >50% respiratory variability, suggesting right atrial pressure of 8 mmHg.             Asthma -well   controlled on home inhalers/ nebs                       COPD - not  followed by pulmonology   not  on baseline oxygen ,    A. Fib -   atrial fibrillation CHA2DS2 vas score   3   current  on anticoagulation with   Eliquis, not compliant          -  Rate control:  Currently controlled with  Toprolol,  Diltiazem unclear if he actually take this   Liver disease   Hepatic Function Panel     Component Value Date/Time   PROT 7.7 05/05/2023 1430   ALBUMIN 3.8 05/05/2023 1430   AST 71 (H) 05/05/2023 1430   ALT 49 (H) 05/05/2023 1430   ALKPHOS 110 05/05/2023 1430   BILITOT 1.2 05/05/2023  1430   BILIDIR 0.2 05/05/2023 1430   IBILI 1.0 (H) 05/05/2023 1430   INR 1.3    While in ER: CT scan showed large hernia General surgery has been  consulted  Keep NPO postmidnight in case theres is availability in AM   Medical stabilization   Pt also found to have bedbug infestation  Lab Orders         Lipase, blood         CBC with Diff         Urinalysis, Routine w reflex microscopic -Urine, Clean Catch         Hepatic function panel         Basic metabolic panel         Magnesium         Protime-INR         Vitamin B12         Folate         Rapid urine drug screen (hospital performed)         Ethanol         Vitamin B1         Ammonia         CK         Phosphorus         I-Stat Chem 8, ED         I-Stat Lactic Acid, ED       CXR -  Stable irregular opacity in the right upper lobe, which may reflect scarring, infectious, or inflammatory process. Underlying neoplasm is not excluded. CT chest is recommended for further evaluation. 2. Hyperinflation of the lungs.  CTabd/pelvis -  large  large right inguinal hernia which contains the cecum, part of the ascending colon, the appendix and distal ileal small bowel loops. No findings for incarceration or obstruction.  Small left inguinal hernia  Diffuse fatty infiltration of the liver.     Following Medications were ordered in ER: Medications  LORazepam (ATIVAN) tablet 1-4 mg (has  no administration in time range)    Or  LORazepam (ATIVAN) injection 1-4 mg (has no administration in time range)  thiamine (VITAMIN B1) tablet 100 mg (100 mg Oral Given 05/05/23 1934)    Or  thiamine (VITAMIN B1) injection 100 mg ( Intravenous See Alternative 05/05/23 1934)  folic acid (FOLVITE) tablet 1 mg (1 mg Oral Given 05/05/23 1934)  multivitamin with minerals tablet 1 tablet (1 tablet Oral Given 05/05/23 1934)  HYDROmorphone (DILAUDID) injection 0.5 mg (0.5 mg Intravenous Given 05/05/23 1650)  albuterol (PROVENTIL) (2.5 MG/3ML)  0.083% nebulizer solution 2.5 mg (2.5 mg Nebulization Given 05/05/23 1651)  iohexol (OMNIPAQUE) 300 MG/ML solution 100 mL (100 mLs Intravenous Contrast Given 05/05/23 1814)    _______________________________________________________ ER Provider Called:   General surgery    Dr.  Luisa Hart They Recommend admit to medicine    SEEN in ER Rec NPO post midnight    ED Triage Vitals  Encounter Vitals Group     BP 05/05/23 1530 (!) 176/93     Systolic BP Percentile --      Diastolic BP Percentile --      Pulse Rate 05/05/23 1530 70     Resp 05/05/23 1530 16     Temp 05/05/23 1700 97.6 F (36.4 C)     Temp src --      SpO2 05/05/23 1530 99 %     Weight --      Height --      Head Circumference --      Peak Flow --      Pain Score 05/05/23 1937 8     Pain Loc --      Pain Education --      Exclude from Growth Chart --   ZOXW(96)@     _________________________________________ Significant initial  Findings: Abnormal Labs Reviewed  CBC WITH DIFFERENTIAL/PLATELET - Abnormal; Notable for the following components:      Result Value   RBC 4.06 (*)    MCV 101.0 (*)    All other components within normal limits  URINALYSIS, ROUTINE W REFLEX MICROSCOPIC - Abnormal; Notable for the following components:   Ketones, ur 20 (*)    Leukocytes,Ua TRACE (*)    All other components within normal limits  HEPATIC FUNCTION PANEL - Abnormal; Notable for the following components:   AST 71 (*)    ALT 49 (*)    Indirect Bilirubin 1.0 (*)    All other components within normal limits     ECG: Ordered Personally reviewed and interpreted by me showing: HR : 70 Rhythm: *Sinus rhythm LAD, consider left anterior fascicular block Consider anterior infarct Borderline T abnormalities, inferior lead QTC 466  BNP (last 3 results) Recent Labs    07/22/22 2247  BNP 31.0     COVID-19 Labs  No results for input(s): "DDIMER", "FERRITIN", "LDH", "CRP" in the last 72 hours.  Lab Results  Component Value  Date   SARSCOV2NAA POSITIVE (A) 07/22/2022   SARSCOV2NAA NEGATIVE 03/09/2022   SARSCOV2NAA NEGATIVE 11/10/2019   SARSCOV2NAA NEGATIVE 09/09/2019    The recent clinical data is shown below. Vitals:   05/05/23 1530 05/05/23 1700 05/05/23 1807  BP: (!) 176/93  (!) 173/97  Pulse: 70  73  Resp: 16  17  Temp:  97.6 F (36.4 C) 97.6 F (36.4 C)  SpO2: 99%  96%      WBC     Component Value Date/Time   WBC 5.1 05/05/2023 1430   LYMPHSABS 1.6 05/05/2023 1430  MONOABS 0.6 05/05/2023 1430   EOSABS 0.1 05/05/2023 1430   BASOSABS 0.1 05/05/2023 1430    Lactic Acid, Venous    Component Value Date/Time   LATICACIDVEN 1.9 05/05/2023 1445      Procalcitonin   Ordered      UA leukouria   Urine analysis:    Component Value Date/Time   COLORURINE YELLOW 05/05/2023 1500   APPEARANCEUR CLEAR 05/05/2023 1500   LABSPEC 1.018 05/05/2023 1500   PHURINE 5.0 05/05/2023 1500   GLUCOSEU NEGATIVE 05/05/2023 1500   HGBUR NEGATIVE 05/05/2023 1500   BILIRUBINUR NEGATIVE 05/05/2023 1500   KETONESUR 20 (A) 05/05/2023 1500   PROTEINUR NEGATIVE 05/05/2023 1500   NITRITE NEGATIVE 05/05/2023 1500   LEUKOCYTESUR TRACE (A) 05/05/2023 1500     __________________________________________________________ Recent Labs  Lab 05/05/23 1430 05/05/23 1444  NA 138 141  K 3.8 3.9  CO2 25  --   GLUCOSE 78 71  BUN 13 12  CREATININE 0.96 1.10  CALCIUM 9.1  --   MG 1.7  --     Cr   stable,    Lab Results  Component Value Date   CREATININE 1.10 05/05/2023   CREATININE 0.96 05/05/2023   CREATININE 1.12 10/23/2022    Recent Labs  Lab 05/05/23 1430  AST 71*  ALT 49*  ALKPHOS 110  BILITOT 1.2  PROT 7.7  ALBUMIN 3.8   Lab Results  Component Value Date   CALCIUM 9.1 05/05/2023   PHOS 3.1 03/10/2022        Plt: Lab Results  Component Value Date   PLT 286 05/05/2023       Recent Labs  Lab 05/05/23 1430 05/05/23 1444  WBC 5.1  --   NEUTROABS 2.7  --   HGB 13.7 13.9  HCT 41.0  41.0  MCV 101.0*  --   PLT 286  --     HG/HCT  stable,     Component Value Date/Time   HGB 13.9 05/05/2023 1444   HCT 41.0 05/05/2023 1444   MCV 101.0 (H) 05/05/2023 1430    Recent Labs  Lab 05/05/23 1430  LIPASE 31    ____________________________________________ Hospitalist was called for admission for   Irreducible inguinal hernia      The following Work up has been ordered so far:  Orders Placed This Encounter  Procedures   CT ABDOMEN PELVIS W CONTRAST   DG Chest Portable 1 View   Lipase, blood   CBC with Diff   Urinalysis, Routine w reflex microscopic -Urine, Clean Catch   Hepatic function panel   Basic metabolic panel   Magnesium   Protime-INR   Vitamin B12   Folate   Rapid urine drug screen (hospital performed)   Ethanol   Vitamin B1   Ammonia   CK   Phosphorus   Diet NPO time specified   Initiate Carrier Fluid Protocol   Vital signs every 6 hours X 48 hours, then per unit protocol   Refer to Sidebar Report for reference: ETOH Withdrawal Guidelines   Clinical Institute Withdrawal Assessent (CIWA)   If Ativan given, reassess Clinical Institute Withdrawal Assessment (CIWA) q 1 hour   Notify Pharmacy to change IV Ativan to PO if tolerating POs well.   Notify physician (specify)   Cardiac Monitoring - Continuous Indefinite   Consult to general surgery   Consult to hospitalist   Consult to Transition of Care Team   I-Stat Chem 8, ED   I-Stat Lactic Acid, ED   ED EKG  Insert peripheral IV   Admit to Inpatient (patient's expected length of stay will be greater than 2 midnights or inpatient only procedure)     OTHER Significant initial  Findings:  labs showing:     DM  labs:  HbA1C: No results for input(s): "HGBA1C" in the last 8760 hours.     CBG (last 3)  No results for input(s): "GLUCAP" in the last 72 hours.        Cultures:    Component Value Date/Time   SDES URINE, CATHETERIZED 03/09/2022 1735   SPECREQUEST NONE 03/09/2022 1735    CULT  03/09/2022 1735    NO GROWTH Performed at [facility], [address] [place], [place] [address]    REPTSTATUS 03/11/2022 FINAL 03/09/2022 1735     Radiological Exams on Admission: CT ABDOMEN PELVIS W CONTRAST  Result Date: 05/05/2023 CLINICAL DATA:  Right groin pain.  Known inguinal hernia. EXAM: CT ABDOMEN AND PELVIS WITH CONTRAST TECHNIQUE: Multidetector CT imaging of the abdomen and pelvis was performed using the standard protocol following bolus administration of intravenous contrast. RADIATION DOSE REDUCTION: This exam was performed according to the departmental dose-optimization program which includes automated exposure control, adjustment of the mA and/or kV according to patient size and/or use of iterative reconstruction technique. CONTRAST:  OMNIPAQUE IOHEXOL 300 MG/ML  SOLN COMPARISON:  CT scan 01/15/2023 FINDINGS: Lower chest: The lung bases are clear of acute process. No pleural effusion or pulmonary lesions. The heart is normal in size. No pericardial effusion. The distal esophagus and aorta are unremarkable. Hepatobiliary: Diffuse fatty infiltration of the liver. 2 cm lesion in segment 4 B demonstrates peripheral nodular enhancement and is consistent with a benign hemangioma. Mild gallbladder contraction. No biliary dilatation. Pancreas: Unremarkable. No pancreatic ductal dilatation or surrounding inflammatory changes. Spleen: Normal in size without focal abnormality. Adrenals/Urinary Tract: Adrenal glands are unremarkable. Kidneys are normal, without renal calculi, focal lesion, or hydronephrosis. Bladder is unremarkable. Stomach/Bowel: The stomach, duodenum, small bowel and colon are unremarkable. No acute inflammatory changes, mass lesions obstructive findings. The terminal ileum and appendix are normal. Vascular/Lymphatic: Stable atherosclerotic calcifications involving the aorta and iliac arteries but no aneurysm or dissection. The major venous structures are  patent. No mesenteric or retroperitoneal mass or adenopathy. Reproductive: The prostate gland and seminal vesicles are unremarkable. Other: Very large right inguinal hernia which contains the cecum, part of the ascending colon, the appendix and distal ileal small bowel loops. No findings for incarceration or obstruction. Small left inguinal hernia containing a small portion of the sigmoid colon but no findings for obstruction or incarceration. Musculoskeletal: No significant bony findings. Remote L1 compression fracture and stable degenerative changes involving the spine and hips. IMPRESSION: 1. Very large right inguinal hernia which contains the cecum, part of the ascending colon, the appendix and distal ileal small bowel loops. No findings for incarceration or obstruction. 2. Small left inguinal hernia containing a small portion of the sigmoid colon but no findings for obstruction or incarceration. 3. Diffuse fatty infiltration of the liver. 4. 2 cm benign appearing hemangioma in segment 4B of the liver. Aortic Atherosclerosis (ICD10-I70.0). Electronically Signed   By: Rudie Meyer M.D.   On: 05/05/2023 18:50   _______________________________________________________________________________________________________ Latest  Blood pressure (!) 173/97, pulse 73, temperature 97.6 F (36.4 C), resp. rate 17, SpO2 96%.   Vitals  labs and radiology finding personally reviewed  Review of Systems:    Pertinent positives include constipation shortness of breath at rest.  dyspnea on  exertion  Constitutional:  No weight loss, night sweats, Fevers, chills, fatigue, weight loss  HEENT:  No headaches, Difficulty swallowing,Tooth/dental problems,Sore throat,  No sneezing, itching, ear ache, nasal congestion, post nasal drip,  Cardio-vascular:  No chest pain, Orthopnea, PND, anasarca, dizziness, palpitations.no Bilateral lower extremity swelling  GI:  No heartburn, indigestion, abdominal pain, nausea, vomiting,  diarrhea, change in bowel habits, loss of appetite, melena, blood in stool, hematemesis Resp:  no , No excess mucus, no productive cough, No non-productive cough, No coughing up of blood.No change in color of mucus.No wheezing. Skin:  no rash or lesions. No jaundice GU:  no dysuria, change in color of urine, no urgency or frequency. No straining to urinate.  No flank pain.  Musculoskeletal:  No joint pain or no joint swelling. No decreased range of motion. No back pain.  Psych:  No change in mood or affect. No depression or anxiety. No memory loss.  Neuro: no localizing neurological complaints, no tingling, no weakness, no double vision, no gait abnormality, no slurred speech, no confusion  All systems reviewed and apart from HOPI all are negative _______________________________________________________________________________________________ Past Medical History:   Past Medical History:  Diagnosis Date   Allergic rhinitis    Asthma    BPH (benign prostatic hyperplasia)    Carpal tunnel syndrome    Hypertension       No past surgical history on file.  Social History:  Ambulatory  cane,     reports that he has been smoking cigarettes. He has never used smokeless tobacco. He reports current alcohol use. He reports that he does not use drugs.     Family History:   Family History  Problem Relation Age of Onset   Colon cancer Father    ______________________________________________________________________________________________ Allergies: No Known Allergies   Prior to Admission medications   Medication Sig Start Date End Date Taking? Authorizing Provider  albuterol (PROVENTIL HFA;VENTOLIN HFA) 108 (90 Base) MCG/ACT inhaler Inhale 2 puffs into the lungs every 4 (four) hours as needed for wheezing or shortness of breath. 07/03/16   Horton, Mayer Masker, MD  apixaban (ELIQUIS) 5 MG TABS tablet Take 1 tablet (5 mg total) by mouth 2 (two) times daily. 03/23/22 06/21/22  Darlin Drop, DO  atorvastatin (LIPITOR) 20 MG tablet Take 20 mg by mouth daily. 01/03/22   [provider]  benzonatate (TESSALON) 100 MG capsule Take 1 capsule (100 mg total) by mouth every 8 (eight) hours. 07/23/22   Garlon Hatchet, PA-C  diltiazem (CARDIZEM CD) 360 MG 24 hr capsule Take 1 capsule (360 mg total) by mouth daily. 03/24/22 04/23/22  Darlin Drop, DO  furosemide (LASIX) 20 MG tablet Take 1 tablet (20 mg total) by mouth daily. 03/24/22 04/23/22  Darlin Drop, DO  HYDROcodone-acetaminophen (NORCO/VICODIN) 5-325 MG tablet Take 1-2 tablets by mouth every 4 (four) hours as needed for moderate pain. 03/31/23   Gilda Crease, MD  hydrOXYzine (ATARAX) 10 MG tablet Take 10-20 mg by mouth at bedtime. 02/19/22   [provider]  metoprolol succinate (TOPROL-XL) 100 MG 24 hr tablet Take 1 tablet (100 mg total) by mouth daily. Take with or immediately following a meal. 03/24/22 04/23/22  Darlin Drop, DO  Multiple Vitamin (MULTIVITAMIN WITH MINERALS) TABS tablet Take 1 tablet by mouth daily.    [provider]  nicotine (NICODERM CQ - DOSED IN MG/24 HR) 7 mg/24hr patch Place 1 patch (7 mg total) onto the skin daily. 03/24/22   Margo Aye,  Oliver Pila, DO  omeprazole (PRILOSEC) 20 MG capsule Take 1 capsule (20 mg total) by mouth daily. Patient taking differently: Take 20 mg by mouth daily as needed (acid reflux). 10/28/19   Tegeler, Canary Brim, MD  predniSONE (DELTASONE) 10 MG tablet Take 2 tablets (20 mg total) by mouth daily. 07/23/22   Garlon Hatchet, PA-C  SYMBICORT 160-4.5 MCG/ACT inhaler Inhale 2 puffs into the lungs daily.  10/14/19   [provider]  Dwyane Luo 200-62.5-25 MCG/ACT AEPB Inhale 1 puff into the lungs daily. 02/20/22   [provider]    ___________________________________________________________________________________________________ Physical Exam:    05/05/2023    6:07 PM 05/05/2023    3:30 PM 03/31/2023    7:00 AM  Vitals  with BMI  Systolic 173 176 027  Diastolic 97 93 98  Pulse 73 70 87     1. General:  in No  Acute distress   Chronically ill   -appearing 2. Psychological: Alert and   Oriented 3. Head/ENT:   Dry Mucous Membranes                          Head Non traumatic, neck supple                         Poor Dentition 4. SKIN: decreased Skin turgor,  Skin clean Dry and intact no rash    5. Heart: Regular rate and rhythm no  Murmur, no Rub or gallop 6. Lungs: , no wheezes or crackles   7. Abdomen: Soft,  non-tender, Non distended large inguinal hernia on the right  8. Lower extremities: no clubbing, cyanosis, no  edema 9. Neurologically Grossly intact, moving all 4 extremities equally  Tremulous right >left 10. MSK: Normal range of motion    Chart has been reviewed  ______________________________________________________________________________________________  Assessment/Plan 72 y.o. male with medical history significant of  asthma, COPD, asthma, Etoh Abuse, tobacco abuse, bilateral inguinal hernia, a.fib on Eliquis, systolic CHF  Admitted for   Irreducible inguinal hernia     Present on Admission:  Transaminitis  EtOH dependence (HCC)  Tremor  COPD (chronic obstructive pulmonary disease) (HCC)  Chronic systolic CHF (congestive heart failure) (HCC)  Paroxysmal atrial fibrillation (HCC)  Bedbug bite  Chest mass     Inguinal hernia Appreciate general surgery consult Npo post midnight  Transaminitis Chronic in the settin gof EtOH abuse  EtOH dependence (HCC) Order CIWA protocol  Encourage alcohol cessation  Tremor Chronic essential tremor right worse than left Would likely benefit from long-term beta-blocker  COPD (chronic obstructive pulmonary disease) (HCC) Chronic continue home medications  Chronic systolic CHF (congestive heart failure) (HCC) Hold diuretics for tonight and monitor fluid status  Paroxysmal atrial fibrillation (HCC) Continue to hold Eliquis as  patient will likely need surgical interventions.  Low-dose beta-blocker metoprolol 12.5 twice daily and monitor heart rate/blood pressure 80s unclear if patient has been taking his home blood pressure meds  Bedbug bite Contact precautions  Chest mass Had received contrasted study today. Can tempted to obtain CT chest tomorrow to further evaluate    Other plan as per orders.  DVT prophylaxis:  SCD     Code Status:    Code Status: Prior FULL CODE  as per patient   I had personally discussed CODE STATUS with patient   ACP   none   Family Communication:   Family not at  Bedside    Diet  Diet Orders (From admission, onward)     Start     Ordered   05/06/23 0001  Diet NPO time specified  Diet effective midnight        05/05/23 1909            Disposition Plan:        To home once workup is complete and patient is stable   Following barriers for discharge:                                                      Electrolytes corrected                                                          Pain controlled with PO medications                                                         Will need consultants to evaluate patient prior to discharge       Consult Orders  (From admission, onward)           Start     Ordered   05/05/23 1844  Consult to hospitalist  Once       Provider:  (Not yet assigned)  Question Answer Comment  Place call to: [facility]   Reason for Consult Admit      05/05/23 1843                               Would benefit from PT/OT eval prior to DC  Ordered                                        Transition of care consulted                                       Consults called:   Treatment Team:  Ccs, Md, MD  Admission status:  ED Disposition     ED Disposition  Admit   Condition  --   Comment  Hospital Area: [facility] [[id number]]  Level of Care: Progressive [102]  Admit to Progressive based on  following criteria: NEUROLOGICAL AND NEUROSURGICAL complex patients with significant risk of instability, who do not meet ICU criteria, yet require close observation or frequent assessment (< / = every 2 - 4 hours) with medical / nursing intervention.  May admit patient to [facility] or [facility] if equivalent level of care is available:: No  Covid Evaluation: Asymptomatic - no recent exposure (last 10 days) testing not required  Diagnosis: Inguinal hernia [[id number]]  Admitting Physician: Therisa Doyne [[id number]]  Attending Physician: Therisa Doyne [[id number]]  Certification:: I certify this patient will need inpatient services for at  least 2 midnights  Expected Medical Readiness: 05/07/2023           inpatient     I Expect 2 midnight stay secondary to severity of patient's current illness need for inpatient interventions justified by the following:    Severe lab/radiological/exam abnormalities including:    Large inguinal hernia and extensive comorbidities including:  substance abuse   CAD   COPD/asthma   That are currently affecting medical management.   I expect  patient to be hospitalized for 2 midnights requiring inpatient medical care.  Patient is at high risk for adverse outcome (such as loss of life or disability) if not treated.  Indication for inpatient stay as follows:    severe pain requiring acute inpatient management,  inability to maintain oral hydration    Need for operative/procedural  intervention    Need for   IV fluids,  IV pain medications,     Level of care      progressive    tele indefinitely please discontinue once patient no longer qualifies COVID-19 Labs   Lab Results  Component Value Date   SARSCOV2NAA POSITIVE (A) 07/22/2022      Dowell Hoon 05/05/2023, 9:34 PM    [facility]     after 2 AM please page floor coverage PA If 7AM-7PM, please contact the day team taking care of the patient using [email or web address]

## 2023-05-05 NOTE — ED Triage Notes (Signed)
Patient BIB EMS for abdominal pain and constipation, complains of pain in groin area from hernia.

## 2023-05-05 NOTE — Assessment & Plan Note (Signed)
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

## 2023-05-05 NOTE — Assessment & Plan Note (Signed)
Chronic essential tremor right worse than left Would likely benefit from long-term beta-blocker

## 2023-05-05 NOTE — Assessment & Plan Note (Signed)
Chronic continue home medications 

## 2023-05-05 NOTE — ED Notes (Signed)
Bed bugs found on pt.

## 2023-05-05 NOTE — Subjective & Objective (Signed)
3 day hx of Constipation came to ER  Has hx of inguinal hernia but has been getting a bit bigger  More pain  Hx of COPD/Asthma Reports daily EtOH use but no hx of DTs has chronic essential tremmor worse in right hand Denies any fever no chills  Has chronic SOB and cough due to COPD/ashtma Still smokes on occasion No nausea vomiting.

## 2023-05-05 NOTE — Assessment & Plan Note (Signed)
Had received contrasted study today. Can tempted to obtain CT chest tomorrow to further evaluate

## 2023-05-06 ENCOUNTER — Inpatient Hospital Stay (HOSPITAL_COMMUNITY): Payer: Medicare HMO | Admitting: Certified Registered"

## 2023-05-06 ENCOUNTER — Encounter (HOSPITAL_COMMUNITY): Payer: Self-pay | Admitting: Internal Medicine

## 2023-05-06 ENCOUNTER — Inpatient Hospital Stay (HOSPITAL_COMMUNITY): Payer: Medicare HMO

## 2023-05-06 ENCOUNTER — Encounter (HOSPITAL_COMMUNITY)
Admission: EM | Disposition: A | Discharge status: Home / Self Care | Payer: Self-pay | Source: Home / Self Care | Attending: Internal Medicine

## 2023-05-06 DIAGNOSIS — K4020 Bilateral inguinal hernia, without obstruction or gangrene, not specified as recurrent: Secondary | ICD-10-CM | POA: Diagnosis not present

## 2023-05-06 DIAGNOSIS — F17210 Nicotine dependence, cigarettes, uncomplicated: Secondary | ICD-10-CM

## 2023-05-06 DIAGNOSIS — I4891 Unspecified atrial fibrillation: Secondary | ICD-10-CM | POA: Diagnosis not present

## 2023-05-06 DIAGNOSIS — K4021 Bilateral inguinal hernia, without obstruction or gangrene, recurrent: Secondary | ICD-10-CM | POA: Diagnosis not present

## 2023-05-06 DIAGNOSIS — I10 Essential (primary) hypertension: Secondary | ICD-10-CM | POA: Diagnosis not present

## 2023-05-06 HISTORY — PX: INGUINAL HERNIA REPAIR: SHX194

## 2023-05-06 LAB — ECHOCARDIOGRAM COMPLETE
AR max vel: 2.64 cm2
AV Area VTI: 2.34 cm2
AV Area mean vel: 2.29 cm2
AV Mean grad: 3 mm[Hg]
AV Peak grad: 5.6 mm[Hg]
Ao pk vel: 1.18 m/s
Area-P 1/2: 4.54 cm2
Height: 74 in
S' Lateral: 3.8 cm
Weight: 2546.75 [oz_av]

## 2023-05-06 LAB — COMPREHENSIVE METABOLIC PANEL
ALT: 36 U/L (ref 0–44)
AST: 44 U/L — ABNORMAL HIGH (ref 15–41)
Albumin: 3.1 g/dL — ABNORMAL LOW (ref 3.5–5.0)
Alkaline Phosphatase: 91 U/L (ref 38–126)
Anion gap: 10 (ref 5–15)
BUN: 13 mg/dL (ref 8–23)
CO2: 26 mmol/L (ref 22–32)
Calcium: 8.9 mg/dL (ref 8.9–10.3)
Chloride: 103 mmol/L (ref 98–111)
Creatinine, Ser: 0.88 mg/dL (ref 0.61–1.24)
GFR, Estimated: >60 mL/min (ref >60)
Glucose, Bld: 80 mg/dL (ref 70–99)
Potassium: 3.2 mmol/L — ABNORMAL LOW (ref 3.5–5.1)
Sodium: 139 mmol/L (ref 135–145)
Total Bilirubin: 1.1 mg/dL (ref 0.3–1.2)
Total Protein: 6.4 g/dL — ABNORMAL LOW (ref 6.5–8.1)

## 2023-05-06 LAB — CBC
HCT: 36.7 % — ABNORMAL LOW (ref 39.0–52.0)
Hemoglobin: 12.4 g/dL — ABNORMAL LOW (ref 13.0–17.0)
MCH: 33.8 pg (ref 26.0–34.0)
MCHC: 33.8 g/dL (ref 30.0–36.0)
MCV: 100 fL (ref 80.0–100.0)
Platelets: 256 10*3/uL (ref 150–400)
RBC: 3.67 MIL/uL — ABNORMAL LOW (ref 4.22–5.81)
RDW: 11.9 % (ref 11.5–15.5)
WBC: 4.7 10*3/uL (ref 4.0–10.5)
nRBC: 0 % (ref 0.0–0.2)

## 2023-05-06 LAB — FOLATE: Folate: 18.1 ng/mL (ref >5.9)

## 2023-05-06 LAB — MAGNESIUM: Magnesium: 1.7 mg/dL (ref 1.7–2.4)

## 2023-05-06 LAB — PHOSPHORUS: Phosphorus: 4 mg/dL (ref 2.5–4.6)

## 2023-05-06 LAB — BRAIN NATRIURETIC PEPTIDE: B Natriuretic Peptide: 278.1 pg/mL — ABNORMAL HIGH (ref 0.0–100.0)

## 2023-05-06 LAB — HIV ANTIBODY (ROUTINE TESTING W REFLEX): HIV Screen 4th Generation wRfx: NONREACTIVE

## 2023-05-06 LAB — VITAMIN B12: Vitamin B-12: 577 pg/mL (ref 180–914)

## 2023-05-06 SURGERY — REPAIR, HERNIA, INGUINAL, ADULT
Anesthesia: Regional | Laterality: Bilateral

## 2023-05-06 MED ORDER — LACTATED RINGERS IV SOLN: INTRAVENOUS | Status: DC

## 2023-05-06 MED ORDER — BUPIVACAINE HCL (PF) 0.5 % IJ SOLN
INTRAMUSCULAR | Status: AC
  Filled 2023-05-06: qty 30

## 2023-05-06 MED ORDER — FENTANYL CITRATE (PF) 100 MCG/2ML IJ SOLN
INTRAMUSCULAR | Status: DC | PRN
  Administered 2023-05-06: 50 ug via INTRAVENOUS

## 2023-05-06 MED ORDER — DILTIAZEM HCL ER COATED BEADS 180 MG PO CP24
360.0000 mg | ORAL_CAPSULE | Freq: Every day | ORAL | Status: DC
  Administered 2023-05-06 – 2023-05-07 (×2): 360 mg via ORAL
  Filled 2023-05-06 (×2): qty 2

## 2023-05-06 MED ORDER — LIDOCAINE HCL (PF) 2 % IJ SOLN
INTRAMUSCULAR | Status: AC
  Filled 2023-05-06: qty 5

## 2023-05-06 MED ORDER — ONDANSETRON HCL 4 MG/2ML IJ SOLN
INTRAMUSCULAR | Status: AC
  Filled 2023-05-06: qty 2

## 2023-05-06 MED ORDER — PHENYLEPHRINE 80 MCG/ML (10ML) SYRINGE FOR IV PUSH (FOR BLOOD PRESSURE SUPPORT)
PREFILLED_SYRINGE | INTRAVENOUS | Status: DC | PRN
  Administered 2023-05-06: 160 ug via INTRAVENOUS

## 2023-05-06 MED ORDER — MIDAZOLAM HCL 2 MG/2ML IJ SOLN
INTRAMUSCULAR | Status: AC
  Filled 2023-05-06: qty 2

## 2023-05-06 MED ORDER — ROPIVACAINE HCL 5 MG/ML IJ SOLN
INTRAMUSCULAR | Status: DC | PRN
  Administered 2023-05-06 (×2): 20 mL via PERINEURAL

## 2023-05-06 MED ORDER — CEFAZOLIN SODIUM-DEXTROSE 2-4 GM/100ML-% IV SOLN
2.0000 g | INTRAVENOUS | Status: AC
  Administered 2023-05-06: 2 g via INTRAVENOUS
  Filled 2023-05-06: qty 100

## 2023-05-06 MED ORDER — CHLORHEXIDINE GLUCONATE 0.12 % MT SOLN
15.0000 mL | Freq: Once | OROMUCOSAL | Status: AC
  Administered 2023-05-06: 15 mL via OROMUCOSAL

## 2023-05-06 MED ORDER — SUGAMMADEX SODIUM 200 MG/2ML IV SOLN
INTRAVENOUS | Status: DC | PRN
  Administered 2023-05-06: 150 mg via INTRAVENOUS

## 2023-05-06 MED ORDER — ORAL CARE MOUTH RINSE: 15.0000 mL | Freq: Once | OROMUCOSAL | Status: AC

## 2023-05-06 MED ORDER — ACETAMINOPHEN 500 MG PO TABS
1000.0000 mg | ORAL_TABLET | Freq: Once | ORAL | Status: AC
Start: 2023-05-06 — End: 2023-05-06
  Administered 2023-05-06: 1000 mg via ORAL
  Filled 2023-05-06: qty 2

## 2023-05-06 MED ORDER — MIDAZOLAM HCL 2 MG/2ML IJ SOLN
INTRAMUSCULAR | Status: DC | PRN
  Administered 2023-05-06: 2 mg via INTRAVENOUS

## 2023-05-06 MED ORDER — LIDOCAINE 2% (20 MG/ML) 5 ML SYRINGE
INTRAMUSCULAR | Status: DC | PRN
  Administered 2023-05-06: 40 mg via INTRAVENOUS

## 2023-05-06 MED ORDER — BUPIVACAINE HCL (PF) 0.5 % IJ SOLN
INTRAMUSCULAR | Status: DC | PRN
  Administered 2023-05-06: 20 mL

## 2023-05-06 MED ORDER — PROPOFOL 10 MG/ML IV BOLUS
INTRAVENOUS | Status: DC | PRN
  Administered 2023-05-06: 130 mg via INTRAVENOUS

## 2023-05-06 MED ORDER — DEXAMETHASONE SODIUM PHOSPHATE 10 MG/ML IJ SOLN
INTRAMUSCULAR | Status: DC | PRN
  Administered 2023-05-06 (×2): 5 mg

## 2023-05-06 MED ORDER — DEXAMETHASONE SODIUM PHOSPHATE 10 MG/ML IJ SOLN
INTRAMUSCULAR | Status: DC | PRN
  Administered 2023-05-06: 4 mg via INTRAVENOUS

## 2023-05-06 MED ORDER — ROCURONIUM BROMIDE 10 MG/ML (PF) SYRINGE
PREFILLED_SYRINGE | INTRAVENOUS | Status: DC | PRN
  Administered 2023-05-06: 50 mg via INTRAVENOUS

## 2023-05-06 MED ORDER — ATORVASTATIN CALCIUM 20 MG PO TABS: 20.0000 mg | ORAL_TABLET | Freq: Every day | ORAL | Status: DC

## 2023-05-06 MED ORDER — 0.9 % SODIUM CHLORIDE (POUR BTL) OPTIME
TOPICAL | Status: DC | PRN
  Administered 2023-05-06: 1000 mL

## 2023-05-06 MED ORDER — ONDANSETRON HCL 4 MG/2ML IJ SOLN
INTRAMUSCULAR | Status: DC | PRN
  Administered 2023-05-06: 4 mg via INTRAVENOUS

## 2023-05-06 MED ORDER — HYDRALAZINE HCL 20 MG/ML IJ SOLN
5.0000 mg | Freq: Four times a day (QID) | INTRAMUSCULAR | Status: DC | PRN
  Administered 2023-05-06: 5 mg via INTRAVENOUS
  Filled 2023-05-06: qty 1

## 2023-05-06 MED ORDER — PROPOFOL 10 MG/ML IV BOLUS
INTRAVENOUS | Status: AC
  Filled 2023-05-06: qty 20

## 2023-05-06 MED ORDER — POTASSIUM CHLORIDE 10 MEQ/100ML IV SOLN
10.0000 meq | INTRAVENOUS | Status: AC
  Administered 2023-05-06: 10 meq via INTRAVENOUS
  Filled 2023-05-06: qty 100

## 2023-05-06 MED ORDER — DEXAMETHASONE SODIUM PHOSPHATE 10 MG/ML IJ SOLN
INTRAMUSCULAR | Status: AC
  Filled 2023-05-06: qty 1

## 2023-05-06 MED ORDER — HYDROCODONE-ACETAMINOPHEN 5-325 MG PO TABS
ORAL_TABLET | ORAL | Status: AC
  Filled 2023-05-06: qty 1

## 2023-05-06 MED ORDER — BUPIVACAINE LIPOSOME 1.3 % IJ SUSP
INTRAMUSCULAR | Status: AC
  Filled 2023-05-06: qty 20

## 2023-05-06 MED ORDER — ROCURONIUM BROMIDE 10 MG/ML (PF) SYRINGE
PREFILLED_SYRINGE | INTRAVENOUS | Status: AC
  Filled 2023-05-06: qty 10

## 2023-05-06 MED ORDER — IOHEXOL 300 MG/ML  SOLN
75.0000 mL | Freq: Once | INTRAMUSCULAR | Status: AC | PRN
  Administered 2023-05-06: 75 mL via INTRAVENOUS

## 2023-05-06 MED ORDER — ALBUTEROL SULFATE HFA 108 (90 BASE) MCG/ACT IN AERS
INHALATION_SPRAY | RESPIRATORY_TRACT | Status: DC | PRN
  Administered 2023-05-06: 6 via RESPIRATORY_TRACT

## 2023-05-06 MED ORDER — METOPROLOL SUCCINATE ER 100 MG PO TB24: 100.0000 mg | ORAL_TABLET | Freq: Every day | ORAL | Status: DC

## 2023-05-06 MED ORDER — ALBUTEROL SULFATE HFA 108 (90 BASE) MCG/ACT IN AERS
INHALATION_SPRAY | RESPIRATORY_TRACT | Status: AC
  Filled 2023-05-06: qty 6.7

## 2023-05-06 MED ORDER — FENTANYL CITRATE PF 50 MCG/ML IJ SOSY: 25.0000 ug | PREFILLED_SYRINGE | INTRAMUSCULAR | Status: DC | PRN

## 2023-05-06 MED ORDER — FENTANYL CITRATE (PF) 100 MCG/2ML IJ SOLN
INTRAMUSCULAR | Status: AC
  Filled 2023-05-06: qty 2

## 2023-05-06 SURGICAL SUPPLY — 32 items
ADH SKN CLS APL DERMABOND .7 (GAUZE/BANDAGES/DRESSINGS) ×1
APL PRP STRL LF DISP 70% ISPRP (MISCELLANEOUS) ×1
BAG COUNTER SPONGE SURGICOUNT (BAG) IMPLANT
BAG SPNG CNTER NS LX DISP (BAG)
CHLORAPREP W/TINT 26 (MISCELLANEOUS) ×1 IMPLANT
DERMABOND ADVANCED .7 DNX12 (GAUZE/BANDAGES/DRESSINGS) ×1 IMPLANT
DRAIN PENROSE 0.5X18 (DRAIN) IMPLANT
DRAPE LAPAROSCOPIC ABDOMINAL (DRAPES) ×1 IMPLANT
ELECT REM PT RETURN 15FT ADLT (MISCELLANEOUS) ×1 IMPLANT
GLOVE BIO SURGEON STRL SZ7.5 (GLOVE) ×1 IMPLANT
GOWN STRL REUS W/ TWL XL LVL3 (GOWN DISPOSABLE) ×1 IMPLANT
GOWN STRL REUS W/TWL XL LVL3 (GOWN DISPOSABLE) ×1
KIT BASIN OR (CUSTOM PROCEDURE TRAY) ×1 IMPLANT
KIT TURNOVER KIT A (KITS) IMPLANT
MARKER SKIN DUAL TIP RULER LAB (MISCELLANEOUS) ×1 IMPLANT
MESH PARIETEX PROGRIP LEFT (Mesh General) IMPLANT
MESH PARIETEX PROGRIP RIGHT (Mesh General) IMPLANT
NDL HYPO 22X1.5 SAFETY MO (MISCELLANEOUS) ×1 IMPLANT
NEEDLE HYPO 22X1.5 SAFETY MO (MISCELLANEOUS) ×1 IMPLANT
PACK GENERAL/GYN (CUSTOM PROCEDURE TRAY) ×1 IMPLANT
SPIKE FLUID TRANSFER (MISCELLANEOUS) IMPLANT
SPONGE T-LAP 4X18 ~~LOC~~+RFID (SPONGE) IMPLANT
SUT MNCRL AB 4-0 PS2 18 (SUTURE) ×1 IMPLANT
SUT SILK 2 0 SH (SUTURE) ×1 IMPLANT
SUT VIC AB 2-0 CT1 27 (SUTURE)
SUT VIC AB 2-0 CT1 TAPERPNT 27 (SUTURE) IMPLANT
SUT VIC AB 3-0 54XBRD REEL (SUTURE) IMPLANT
SUT VIC AB 3-0 BRD 54 (SUTURE)
SUT VIC AB 3-0 SH 27 (SUTURE) ×1
SUT VIC AB 3-0 SH 27XBRD (SUTURE) ×1 IMPLANT
SYR CONTROL 10ML LL (SYRINGE) ×1 IMPLANT
TOWEL OR 17X26 10 PK STRL BLUE (TOWEL DISPOSABLE) ×1 IMPLANT

## 2023-05-06 NOTE — Anesthesia Procedure Notes (Addendum)
Procedure Name: Intubation Date/Time: 05/06/2023 11:55 AM  Performed by: Woodrum, Chelsey L, MDPre-anesthesia Checklist: Patient identified, Emergency Drugs available, Suction available and Patient being monitored Patient Re-evaluated:Patient Re-evaluated prior to induction Oxygen Delivery Method: Circle system utilized Preoxygenation: Pre-oxygenation with 100% oxygen Induction Type: IV induction Ventilation: Mask ventilation without difficulty Laryngoscope Size: Glidescope and 4 Grade View: Grade II Tube type: Oral Tube size: 7.5 mm Number of attempts: 1 Airway Equipment and Method: Stylet and Video-laryngoscopy Placement Confirmation: ETT inserted through vocal cords under direct vision, positive ETCO2 and breath sounds checked- equal and bilateral Secured at: 23 cm Tube secured with: Tape Dental Injury: Teeth and Oropharynx as per pre-operative assessment  Difficulty Due To: Difficult Airway- due to anterior larynx Comments: Easy mask ventilation. DL MAC 4 by CRNA, grade 3 view. DL MAC 4 by MDA, grade 3 view. Glidescope S4 used, grade 2a. Passed tube successfully. Recommend videoscope.

## 2023-05-06 NOTE — Anesthesia Procedure Notes (Signed)
Anesthesia Regional Block: Quadratus lumborum   Pre-Anesthetic Checklist: , timeout performed,  Correct Patient, Correct Site, Correct Laterality,  Correct Procedure, Correct Position, site marked,  Risks and benefits discussed,  Pre-op evaluation,  At surgeon's request and post-op pain management  Laterality: Right  Prep: Maximum Sterile Barrier Precautions used, chloraprep       Needles:  Injection technique: Single-shot  Needle Type: Echogenic Stimulator Needle     Needle Length: 9cm  Needle Gauge: 21     Additional Needles:   Procedures:,,,, ultrasound used (permanent image in chart),,    Narrative:  Start time: 05/06/2023 12:05 PM End time: 05/06/2023 12:09 PM Injection made incrementally with aspirations every 5 mL. Anesthesiologist: Elmer Picker, MD

## 2023-05-06 NOTE — Progress Notes (Signed)
Initial Nutrition Assessment  INTERVENTION:   Once diet advanced: -Ensure Plus High Protein po BID, each supplement provides 350 kcal and 20 grams of protein.   -Continue vitamin supplementation  NUTRITION DIAGNOSIS:   Increased nutrient needs related to chronic illness as evidenced by estimated needs.  GOAL:   Patient will meet greater than or equal to 90% of their needs  MONITOR:   Labs, Weight trends, I & O's, Diet advancement  REASON FOR ASSESSMENT:   Consult Assessment of nutrition requirement/status  ASSESSMENT:   72 y.o.m  w/ hx of asthma, COPD, etoh Abuse, tobacco abuse, bilateral inguinal hernia, a.fib on Eliquis, systolic CHF last echo 9/23- 50-55% lvef presented with constipation x 3 days also noticing that inguinal hernia is getting bit bigger and painful. Reports daily EtOH use but no hx of DTs has chronic essential tremmor worse in right hand.  Patient currently in OR for hernia repair.  Per chart review, pt with history of alcohol abuse, on CIWA.  Will order Ensure supplements to support post-op healing.  Per weight records, pt has lost 10 lbs since 9/22 (5% wt loss x 1  month, significant for time frame).  Medications: Colace, Folic acid, Multivitamin with minerals daily, Senokot, Thiamine, Lactated ringers, KCl  Labs reviewed: Low K   NUTRITION - FOCUSED PHYSICAL EXAM:  Unable to complete, in OR  Diet Order:   Diet Order             Diet NPO time specified Except for: Sips with Meds  Diet effective now                   EDUCATION NEEDS:   Not appropriate for education at this time  Skin:  Skin Assessment: Skin Integrity Issues: Skin Integrity Issues:: Incisions Incisions: bilateral groin  Last BM:  10/24  Height:   Ht Readings from Last 1 Encounters:  05/06/23 6\' 2"  (1.88 m)    Weight:   Wt Readings from Last 1 Encounters:  05/06/23 72.2 kg    BMI:  Body mass index is 20.44 kg/m.  Estimated Nutritional Needs:    Kcal:  2150-2350  Protein:  100-110g  Fluid:  2.1L/day  Tilda Franco, MS, RD, LDN Inpatient Clinical Dietitian Contact information available via Amion

## 2023-05-06 NOTE — Op Note (Signed)
Mark [name] 05/06/2023   Pre-op Diagnosis: BILATERAL INGUINAL HERNIAS     Post-op Diagnosis: same  Procedure(s): Bilateral open inguinal hernia repair with mesh  Surgeon(s): Abigail Miyamoto, MD  Anesthesia: General  Staff:  Circulator: Martina Sinner, RN Scrub Person: Audrie Gallus, CST  Estimated Blood Loss:                Findings: The patient was found to have bilateral indirect inguinal hernias with the right being much larger than the left.  Each side was repaired with a large piece of Prolene ProGrip mesh from [employer]  Procedure: The patient was brought to the operating identifies correct patient.  He is placed upon the operating table general anesthesia was induced.  Anesthesiology then performed bilateral tap blocks under ultrasound guidance.  His abdomen was then prepped and draped in usual sterile fashion.  I anesthetized skin in the right inguinal area with Marcaine.  I then made a longitudinal incision with a scalpel.  I then dissected down through Scarpa's fascia with electrocautery.  I then opened the external oblique fascia toward the internal and external rings.  I controlled the testicular cord and the large hernia sac with a Penrose drain.  He had a very large indirect sac.  All contents were easily reduced back into the abdominal cavity.  I then inverted the sac back through the internal ring and then closed the internal ring with interrupted silk sutures.  A large piece of Prolene ProGrip mesh was then brought to the field.  I placed against the pubic tubercle and the brought around the cord structures.  I then sutured the mesh to the pubic tubercle and the transversalis fascia with interrupted 0 Vicryl sutures.  I then closed the external fascia over the top of the mesh with a running 2-0 Vicryl suture.  Scarpa's fascia was closed interrupted 3-0 Vicryl sutures and the skin was closed a running 4-0 Monocryl  I then anesthetized the skin in the left inguinal  area with Marcaine as well.  I made a corresponding longitudinal incision with a scalpel in the left side.  I then dissected again down through Scarpa's fascia with electrocautery.  The external oblique fascia was then opened toward the internal and external rings.  I again controlled the testicular cord and structures with a Penrose drain.  He had a much smaller indirect hernia sac with the contents already reduced.  I again inverted the sac and placed a back through the internal ring and then closed the ring over the top with an interrupted 2-0 silk suture.  A left sided large piece of Prolene ProGrip mesh was then brought to the field.  I again placed against the pubic tubercle and brought around the internal ring and cord structures.  I then sutured the mesh in place with a single 2-0 Vicryl suture to the pubic tubercle.  Wide coverage of the inguinal floor and internal ring appeared to be achieved.  I then closed the external fascia over the top of the mesh with a running 2-0 Vicryl suture.  Scarpa's fascia was closed with interrupted 3-0 Vicryl sutures and the skin was closed with running 4-0 Monocryl.  Dermabond was then applied to both incisions.  The patient tolerated the procedure well.  All the counts were correct at the end of the procedure.  The patient was then extubated in the operating room and taken in a stable condition to the recovery room.  Abigail Miyamoto   Date: 05/06/2023  Time: 1:04 PM

## 2023-05-06 NOTE — Anesthesia Postprocedure Evaluation (Signed)
Anesthesia Post Note  Patient: Mark [name]  Procedure(s) Performed: HERNIA REPAIR INGUINAL ADULT with MESH (Bilateral)     Patient location during evaluation: PACU Anesthesia Type: Regional and General Level of consciousness: awake and alert Pain management: pain level controlled Vital Signs Assessment: post-procedure vital signs reviewed and stable Respiratory status: spontaneous breathing, nonlabored ventilation, respiratory function stable and patient connected to nasal cannula oxygen Cardiovascular status: blood pressure returned to baseline and stable Postop Assessment: no apparent nausea or vomiting Anesthetic complications: no  No notable events documented.  Last Vitals:  Vitals:   05/06/23 1330 05/06/23 1345  BP: (!) 162/96 (!) 166/91  Pulse: 79 81  Resp: 17 15  Temp:    SpO2: 97% 93%    Last Pain:  Vitals:   05/06/23 1400  TempSrc:   PainSc: 4                  Tevis Conger L Sho Salguero

## 2023-05-06 NOTE — Progress Notes (Signed)
Subjective: No new complaints today.  Hasn't had a BM in a while, but he's hungry with no N/V.  Last dose of Eliquis was 3 days ago.  No hx of DTs per patient.  ROS: See above, otherwise other systems negative  Objective: Vital signs in last 24 hours: Temp:  [97.6 F (36.4 C)-98.2 F (36.8 C)] 98 F (36.7 C) (10/28 0527) Pulse Rate:  [63-87] 63 (10/28 0527) Resp:  [16-18] 18 (10/28 0527) BP: (160-181)/(77-117) 181/101 (10/28 0527) SpO2:  [95 %-100 %] 100 % (10/28 0736) Weight:  [72.2 kg] 72.2 kg (10/27 2136) Last BM Date : 05/02/23  Intake/Output from previous day: 10/27 0701 - 10/28 0700 In: 830.7 [P.O.:480; I.V.:350.7] Out: 400 [Urine:400] Intake/Output this shift: No intake/output data recorded.  PE: Gen: NAd Abd: soft, NT, B inguinal hernias, R>L, chronically incarcerated  Lab Results:  Recent Labs    05/05/23 1430 05/05/23 1444 05/06/23 0500  WBC 5.1  --  4.7  HGB 13.7 13.9 12.4*  HCT 41.0 41.0 36.7*  PLT 286  --  256   BMET Recent Labs    05/05/23 1430 05/05/23 1444 05/06/23 0500  NA 138 141 139  K 3.8 3.9 3.2*  CL 102 102 103  CO2 25  --  26  GLUCOSE 78 71 80  BUN 13 12 13   CREATININE 0.96 1.10 0.88  CALCIUM 9.1  --  8.9   PT/INR Recent Labs    05/05/23 2147  LABPROT 13.4  INR 1.0   CMP     Component Value Date/Time   NA 139 05/06/2023 0500   K 3.2 (L) 05/06/2023 0500   CL 103 05/06/2023 0500   CO2 26 05/06/2023 0500   GLUCOSE 80 05/06/2023 0500   BUN 13 05/06/2023 0500   CREATININE 0.88 05/06/2023 0500   CALCIUM 8.9 05/06/2023 0500   PROT 6.4 (L) 05/06/2023 0500   ALBUMIN 3.1 (L) 05/06/2023 0500   AST 44 (H) 05/06/2023 0500   ALT 36 05/06/2023 0500   ALKPHOS 91 05/06/2023 0500   BILITOT 1.1 05/06/2023 0500   GFRNONAA >60 05/06/2023 0500   GFRAA >60 11/12/2019 0737   Lipase     Component Value Date/Time   LIPASE 31 05/05/2023 1430       Studies/Results: DG Chest Portable 1 View  Result Date:  05/05/2023 CLINICAL DATA:  Plan surgery.  COPD. EXAM: PORTABLE CHEST 1 VIEW COMPARISON:  03/31/2023. FINDINGS: The heart is enlarged and the mediastinal contour is within normal limits. There is atherosclerotic calcification of the aorta. Hyperinflation of the lungs is noted. An irregular opacity is present in the right upper lobe and unchanged from the prior exam. No consolidation, effusion, or pneumothorax. No acute osseous abnormality. IMPRESSION: 1. Stable irregular opacity in the right upper lobe, which may reflect scarring, infectious, or inflammatory process. Underlying neoplasm is not excluded. CT chest is recommended for further evaluation. 2. Hyperinflation of the lungs. Electronically Signed   By: Thornell Sartorius M.D.   On: 05/05/2023 20:06   CT ABDOMEN PELVIS W CONTRAST  Result Date: 05/05/2023 CLINICAL DATA:  Right groin pain.  Known inguinal hernia. EXAM: CT ABDOMEN AND PELVIS WITH CONTRAST TECHNIQUE: Multidetector CT imaging of the abdomen and pelvis was performed using the standard protocol following bolus administration of intravenous contrast. RADIATION DOSE REDUCTION: This exam was performed according to the departmental dose-optimization program which includes automated exposure control, adjustment of the mA and/or kV according to patient size and/or use of iterative  reconstruction technique. CONTRAST:  OMNIPAQUE IOHEXOL 300 MG/ML  SOLN COMPARISON:  CT scan 01/15/2023 FINDINGS: Lower chest: The lung bases are clear of acute process. No pleural effusion or pulmonary lesions. The heart is normal in size. No pericardial effusion. The distal esophagus and aorta are unremarkable. Hepatobiliary: Diffuse fatty infiltration of the liver. 2 cm lesion in segment 4 B demonstrates peripheral nodular enhancement and is consistent with a benign hemangioma. Mild gallbladder contraction. No biliary dilatation. Pancreas: Unremarkable. No pancreatic ductal dilatation or surrounding inflammatory changes.  Spleen: Normal in size without focal abnormality. Adrenals/Urinary Tract: Adrenal glands are unremarkable. Kidneys are normal, without renal calculi, focal lesion, or hydronephrosis. Bladder is unremarkable. Stomach/Bowel: The stomach, duodenum, small bowel and colon are unremarkable. No acute inflammatory changes, mass lesions obstructive findings. The terminal ileum and appendix are normal. Vascular/Lymphatic: Stable atherosclerotic calcifications involving the aorta and iliac arteries but no aneurysm or dissection. The major venous structures are patent. No mesenteric or retroperitoneal mass or adenopathy. Reproductive: The prostate gland and seminal vesicles are unremarkable. Other: Very large right inguinal hernia which contains the cecum, part of the ascending colon, the appendix and distal ileal small bowel loops. No findings for incarceration or obstruction. Small left inguinal hernia containing a small portion of the sigmoid colon but no findings for obstruction or incarceration. Musculoskeletal: No significant bony findings. Remote L1 compression fracture and stable degenerative changes involving the spine and hips. IMPRESSION: 1. Very large right inguinal hernia which contains the cecum, part of the ascending colon, the appendix and distal ileal small bowel loops. No findings for incarceration or obstruction. 2. Small left inguinal hernia containing a small portion of the sigmoid colon but no findings for obstruction or incarceration. 3. Diffuse fatty infiltration of the liver. 4. 2 cm benign appearing hemangioma in segment 4B of the liver. Aortic Atherosclerosis (ICD10-I70.0). Electronically Signed   By: Rudie Meyer M.D.   On: 05/05/2023 18:50    Anti-infectives: Anti-infectives (From admission, onward)    Start     Dose/Rate Route Frequency Ordered Stop   05/06/23 0915  ceFAZolin (ANCEF) IVPB 2g/100 mL premix        2 g 200 mL/hr over 30 Minutes Intravenous On call to O.R. 05/06/23 [id number]  05/07/23 0559        Assessment/Plan Chronically incarcerated B inguinal hernias -NPO -will plan for OR today for open B IHR with mesh. -d/w patient and he is agreeable to proceed -last dose of Eliquis 3 days ago -states he only drinks 1 beer a day, but history of heavy ETOH use.  On CIWA.  No hx of DTs per patient.  -d/w medical service as well  FEN - NPO/IVFs VTE - on hold, SCDs ID - ancef on call to OR  Bedbugs Benign tremor COPD CHF - last echo in 2023 with EF of 50-55% PAF - eliquis on hold ETOH dependence  I reviewed hospitalist notes, last 24 h vitals and pain scores, last 48 h intake and output, last 24 h labs and trends, and last 24 h imaging results.   LOS: 1 day    Letha Cape , [facility] 05/06/2023, 8:25 AM Please see Amion for pager number during day hours 7:00am-4:30pm or 7:00am -11:30am on weekends

## 2023-05-06 NOTE — Progress Notes (Signed)
PROGRESS NOTE Mark [name]  UJW:[id number] DOB: 1950/11/01 DOA: 05/05/2023 PCP: Hillery Aldo, NP  Brief Narrative/Hospital Course: 72 y.o.m  w/ hx of asthma, COPD, etoh Abuse, tobacco abuse, bilateral inguinal hernia, a.fib on Eliquis, systolic CHF last echo 9/23- 50-55% lvef presented with constipation x 3 days also noticing that inguinal hernia is getting bit bigger and painful. Reports daily EtOH use but no hx of DTs has chronic essential tremmor worse in right hand.  He has not had Eliquis for 2 days. In the ED: Vitals BP 170s, afebrile,UDS- THC+, etoh <20,  ast 44, alt 36. K 3.2. CT scan >>large  large right inguinal hernia which contains the cecum, part of the ascending colon, the appendix and distal ileal small bowel loops. Cxr>>irregular opacity right upper lobe scaring infectious or inflammatory process. CT chest ordered.     Subjective: Patient seen and examined On the way to the OR No complaints reports he has been running high on his blood pressure at home, not taking medication No chest pain nausea vomiting fever chills.  Assessment and Plan: Principal Problem:   Inguinal hernia Active Problems:   Tremor   Transaminitis   EtOH dependence (HCC)   COPD (chronic obstructive pulmonary disease) (HCC)   Chronic systolic CHF (congestive heart failure) (HCC)   Paroxysmal atrial fibrillation (HCC)   Bedbug bite   Chest mass   Large right Inguinal hernia: Seen by general surgery and has taken the patient to the OR for bilateral inguinal hernia repair.  Patient is having ongoing pain and swelling on the hernia site  Hypertension: Blood pressure poorly controlled.PTA on Cardizem 360 mg, Lasix, metoprolol 100 daily but he has not been compliant with his medication. Resume post op. Po Cardizem and metoprolol, add prn iv hydralazine.  Chronic systolic CHF: Last lvef 50-55%. Hold diuretics for now. Check bnp, monitor fluid status    PAF: In NSR. Cont to hold Eliquis.  Resume  home Cardizem, metoprolol and Lasix as able   Hypokalemia: Replaced. Monitor Recent Labs  Lab 05/05/23 1430 05/05/23 1444 05/06/23 0500  K 3.8 3.9 3.2*    Mild transaminitis: Likely from EtOH, trend labs nd improving AST/ALT  Alcohol dependence: At risk of withdrawal, continue folate, thiamine and watch for withdrawals on CIW scale  Chronic essential tremor right worse than left at baseline likely benefit from long-term beta-blocker   COPD Asthma: Not in exacerbation.on symbicort- switch to Brovana/Pulmicort/Yupleri   Bedbug bite: Contact precautions   Chest mass/Abnormal chest x-ray: CT chest ordered on admission, subsequently resulted with extensive scarring of the right upper lobe no suspicious lung nodules, diffuse bronchial wall thickening consistent with chronic bronchitis, subacute fracture of the left 8th through 12th ribs  DVT prophylaxis: SCDs Start: 05/05/23 2214 Code Status:   Code Status: Full Code Family Communication: plan of care discussed with patient at bedside. Patient status is: Inpatient because of inguinal hernia Level of care: Progressive   Dispo: The patient is from: home            Anticipated disposition: home tbd Objective: Vitals last 24 hrs: Vitals:   05/06/23 0947 05/06/23 1311 05/06/23 1315 05/06/23 1330  BP: (!) 193/103 (!) 179/115 (!) 186/90 (!) 162/96  Pulse: 66 77 76 79  Resp: 18 19 16 17   Temp: 98.5 F (36.9 C) 98 F (36.7 C)    TempSrc: Oral     SpO2: 96% 100% 100% 97%  Weight: 72.2 kg     Height: 6\' 2"  (1.88 m)  Weight change:   Physical Examination: General exam: alert awake, older than stated age HEENT:Oral mucosa moist, Ear/Nose WNL grossly Respiratory system: bilaterally c;ear BS, no use of accessory muscle Cardiovascular system: S1 & S2 +, No JVD. Gastrointestinal system: Abdomen soft,NT,ND, BS+ bilateral inguinal hernia with swelling Nervous System:Alert, awake, moving extremities. Extremities: LE edema  neg,distal peripheral pulses palpable.  Skin: No rashes,no icterus. MSK: Normal muscle bulk,tone, power  Medications reviewed:  Scheduled Meds:  [MAR Hold] atorvastatin  20 mg Oral Daily   [MAR Hold] docusate sodium  100 mg Oral BID   [MAR Hold] folic acid  1 mg Oral Daily   [MAR Hold] guaiFENesin  600 mg Oral BID   [MAR Hold] mometasone-formoterol  2 puff Inhalation BID   [MAR Hold] multivitamin with minerals  1 tablet Oral Daily   [MAR Hold] nicotine  7 mg Transdermal Daily   [MAR Hold] senna  1 tablet Oral BID   [MAR Hold] thiamine  100 mg Oral Daily   Or   [MAR Hold] thiamine  100 mg Intravenous Daily   Continuous Infusions:  lactated ringers 10 mL/hr at 05/06/23 1008      Diet Order             Diet NPO time specified Except for: Sips with Meds  Diet effective now                   Intake/Output Summary (Last 24 hours) at 05/06/2023 1339 Last data filed at 05/06/2023 1312 Gross per 24 hour  Intake 1630.74 ml  Output 405 ml  Net 1225.74 ml   Net IO Since Admission: 1,225.74 mL [05/06/23 1339]  Wt Readings from Last 3 Encounters:  05/06/23 72.2 kg  03/31/23 77.1 kg  07/22/22 74.8 kg     Unresulted Labs (From admission, onward)     Start     Ordered   05/05/23 1939  Vitamin B1  Add-on,   AD        05/05/23 1938          Data Reviewed: I have personally reviewed following labs and imaging studies CBC: Recent Labs  Lab 05/05/23 1430 05/05/23 1444 05/06/23 0500  WBC 5.1  --  4.7  NEUTROABS 2.7  --   --   HGB 13.7 13.9 12.4*  HCT 41.0 41.0 36.7*  MCV 101.0*  --  100.0  PLT 286  --  256   Basic Metabolic Panel: Recent Labs  Lab 05/05/23 1430 05/05/23 1444 05/05/23 2147 05/06/23 0500  NA 138 141  --  139  K 3.8 3.9  --  3.2*  CL 102 102  --  103  CO2 25  --   --  26  GLUCOSE 78 71  --  80  BUN 13 12  --  13  CREATININE 0.96 1.10  --  0.88  CALCIUM 9.1  --   --  8.9  MG 1.7  --   --  1.7  PHOS  --   --  2.7 4.0   GFR: Estimated  Creatinine Clearance: 79.8 mL/min (by C-G formula based on SCr of 0.88 mg/dL). Liver Function Tests: Recent Labs  Lab 05/05/23 1430 05/06/23 0500  AST 71* 44*  ALT 49* 36  ALKPHOS 110 91  BILITOT 1.2 1.1  PROT 7.7 6.4*  ALBUMIN 3.8 3.1*   Recent Labs  Lab 05/05/23 1430  LIPASE 31   Recent Labs  Lab 05/05/23 2147  AMMONIA 22   Coagulation Profile: Recent  Labs  Lab 05/05/23 2147  INR 1.0  No results for input(s): "TSH", "T4TOTAL", "FREET4", "T3FREE", "THYROIDAB" in the last 72 hours. Sepsis Labs: Recent Labs  Lab 05/05/23 1445  LATICACIDVEN 1.9    No results found for this or any previous visit (from the past 240 hour(s)).  Antimicrobials: Anti-infectives (From admission, onward)    Start     Dose/Rate Route Frequency Ordered Stop   05/06/23 0915  [MAR Hold]  ceFAZolin (ANCEF) IVPB 2g/100 mL premix        (MAR Hold since Mon 05/06/2023 at 0947.Hold Reason: Transfer to a Procedural area)   2 g 200 mL/hr over 30 Minutes Intravenous On call to O.R. 05/06/23 0824 05/06/23 1226      Culture/Microbiology    Component Value Date/Time   SDES URINE, CATHETERIZED 03/09/2022 1735   SPECREQUEST NONE 03/09/2022 1735   CULT  03/09/2022 1735    NO GROWTH Performed at [facility], [address], [place], [place] [address]    REPTSTATUS 03/11/2022 FINAL 03/09/2022 1735    Radiology Studies: CT CHEST W CONTRAST  Result Date: 05/06/2023 CLINICAL DATA:  Abnormal xray - lung nodule, >= 1 cm. EXAM: CT CHEST WITH CONTRAST TECHNIQUE: Multidetector CT imaging of the chest was performed during intravenous contrast administration. RADIATION DOSE REDUCTION: This exam was performed according to the departmental dose-optimization program which includes automated exposure control, adjustment of the mA and/or kV according to patient size and/or use of iterative reconstruction technique. CONTRAST:  75mL OMNIPAQUE IOHEXOL 300 MG/ML  SOLN COMPARISON:  Chest radiograph 05/05/2023.  FINDINGS: Cardiovascular: Normal heart size. No pericardial effusion. Atherosclerotic calcifications of the thoracic aorta and coronary arteries. Mediastinum/Nodes: No enlarged mediastinal, hilar, or axillary lymph nodes. Thyroid gland, trachea, and esophagus demonstrate no significant findings. Lungs/Pleura: Extensive scarring in the right upper lobe. No suspicious lung nodules, consolidation or pulmonary edema. Diffuse bronchial wall thickening and mild bronchiectasis. No pleural effusion or pneumothorax. Upper Abdomen: No acute findings. Musculoskeletal: No chest wall abnormality. Subacute fractures of the left eighth through twelfth ribs. IMPRESSION: 1. Extensive scarring in the right upper lobe. No suspicious lung nodules. 2. Diffuse bronchial wall thickening and mild bronchiectasis, consistent with chronic bronchitis. 3. Subacute fractures of the left eighth through twelfth ribs. Aortic Atherosclerosis (ICD10-I70.0). Electronically Signed   By: Orvan Falconer M.D.   On: 05/06/2023 11:40   DG Chest Portable 1 View  Result Date: 05/05/2023 CLINICAL DATA:  Plan surgery.  COPD. EXAM: PORTABLE CHEST 1 VIEW COMPARISON:  03/31/2023. FINDINGS: The heart is enlarged and the mediastinal contour is within normal limits. There is atherosclerotic calcification of the aorta. Hyperinflation of the lungs is noted. An irregular opacity is present in the right upper lobe and unchanged from the prior exam. No consolidation, effusion, or pneumothorax. No acute osseous abnormality. IMPRESSION: 1. Stable irregular opacity in the right upper lobe, which may reflect scarring, infectious, or inflammatory process. Underlying neoplasm is not excluded. CT chest is recommended for further evaluation. 2. Hyperinflation of the lungs. Electronically Signed   By: Thornell Sartorius M.D.   On: 05/05/2023 20:06   CT ABDOMEN PELVIS W CONTRAST  Result Date: 05/05/2023 CLINICAL DATA:  Right groin pain.  Known inguinal hernia. EXAM: CT  ABDOMEN AND PELVIS WITH CONTRAST TECHNIQUE: Multidetector CT imaging of the abdomen and pelvis was performed using the standard protocol following bolus administration of intravenous contrast. RADIATION DOSE REDUCTION: This exam was performed according to the departmental dose-optimization program which includes automated exposure control, adjustment of  the mA and/or kV according to patient size and/or use of iterative reconstruction technique. CONTRAST:  OMNIPAQUE IOHEXOL 300 MG/ML  SOLN COMPARISON:  CT scan 01/15/2023 FINDINGS: Lower chest: The lung bases are clear of acute process. No pleural effusion or pulmonary lesions. The heart is normal in size. No pericardial effusion. The distal esophagus and aorta are unremarkable. Hepatobiliary: Diffuse fatty infiltration of the liver. 2 cm lesion in segment 4 B demonstrates peripheral nodular enhancement and is consistent with a benign hemangioma. Mild gallbladder contraction. No biliary dilatation. Pancreas: Unremarkable. No pancreatic ductal dilatation or surrounding inflammatory changes. Spleen: Normal in size without focal abnormality. Adrenals/Urinary Tract: Adrenal glands are unremarkable. Kidneys are normal, without renal calculi, focal lesion, or hydronephrosis. Bladder is unremarkable. Stomach/Bowel: The stomach, duodenum, small bowel and colon are unremarkable. No acute inflammatory changes, mass lesions obstructive findings. The terminal ileum and appendix are normal. Vascular/Lymphatic: Stable atherosclerotic calcifications involving the aorta and iliac arteries but no aneurysm or dissection. The major venous structures are patent. No mesenteric or retroperitoneal mass or adenopathy. Reproductive: The prostate gland and seminal vesicles are unremarkable. Other: Very large right inguinal hernia which contains the cecum, part of the ascending colon, the appendix and distal ileal small bowel loops. No findings for incarceration or obstruction. Small left  inguinal hernia containing a small portion of the sigmoid colon but no findings for obstruction or incarceration. Musculoskeletal: No significant bony findings. Remote L1 compression fracture and stable degenerative changes involving the spine and hips. IMPRESSION: 1. Very large right inguinal hernia which contains the cecum, part of the ascending colon, the appendix and distal ileal small bowel loops. No findings for incarceration or obstruction. 2. Small left inguinal hernia containing a small portion of the sigmoid colon but no findings for obstruction or incarceration. 3. Diffuse fatty infiltration of the liver. 4. 2 cm benign appearing hemangioma in segment 4B of the liver. Aortic Atherosclerosis (ICD10-I70.0). Electronically Signed   By: Rudie Meyer M.D.   On: 05/05/2023 18:50     LOS: 1 day   Lanae Boast, MD [facility]  05/06/2023, 1:39 PM

## 2023-05-06 NOTE — Plan of Care (Signed)
  Problem: Education: Goal: Knowledge of General Education information will improve Description: Including pain rating scale, medication(s)/side effects and non-pharmacologic comfort measures Outcome: Progressing   Problem: Health Behavior/Discharge Planning: Goal: Ability to manage health-related needs will improve Outcome: Progressing   Problem: Clinical Measurements: Goal: Cardiovascular complication will be avoided Outcome: Progressing   Problem: Activity: Goal: Risk for activity intolerance will decrease Outcome: Progressing   Problem: Nutrition: Goal: Adequate nutrition will be maintained Outcome: Progressing   Problem: Coping: Goal: Level of anxiety will decrease Outcome: Progressing   Problem: Elimination: Goal: Will not experience complications related to urinary retention Outcome: Progressing

## 2023-05-06 NOTE — TOC Initial Note (Signed)
Transition of Care [facility]) - Initial/Assessment Note    Patient Details  Name: Mark [name] MRN: [id number] Date of Birth: [birth year]  Transition of Care [facility]) CM/SW Contact:    Howell Rucks, RN Phone Number: 05/06/2023, 11:21 AM  Clinical Narrative: Pt off floor for surgical procedure. Will complete initial assessment at later time.                        Patient Goals and CMS Choice            Expected Discharge Plan and Services                                              Prior Living Arrangements/Services                       Activities of Daily Living   ADL Screening (condition at time of admission) Independently performs ADLs?: Yes (appropriate for developmental age) Is the patient deaf or have difficulty hearing?: No Does the patient have difficulty seeing, even when wearing glasses/contacts?: No Does the patient have difficulty concentrating, remembering, or making decisions?: No  Permission Sought/Granted                  Emotional Assessment              Admission diagnosis:  Irreducible inguinal hernia [K40.30] Inguinal hernia [K40.90] Patient Active Problem List   Diagnosis Date Noted   Inguinal hernia 05/05/2023   Chronic systolic CHF (congestive heart failure) (HCC) 05/05/2023   Paroxysmal atrial fibrillation (HCC) 05/05/2023   Bedbug bite 05/05/2023   Chest mass 05/05/2023   COPD (chronic obstructive pulmonary disease) (HCC)    Hypervolemia    Atrial fibrillation with RVR (HCC) 03/10/2022   Acute respiratory failure with hypoxia (HCC) 03/09/2022   Sepsis due to pneumonia (HCC) 03/09/2022   Elevated troponin 03/09/2022   EtOH dependence (HCC) 03/09/2022   Tobacco abuse 03/09/2022   PNA (pneumonia) 03/09/2022   ARF (acute renal failure) (HCC) 03/09/2022   Dehydration 03/09/2022   Abnormal TSH 03/09/2022   Chronic alcohol abuse    Severe persistent asthma with acute exacerbation    AKI (acute kidney  injury) (HCC)    Severe sepsis (HCC)    Asthma exacerbation 11/10/2019   Tremor 11/10/2019   Alcohol abuse with intoxication (HCC) 11/10/2019   Hypertensive urgency 11/10/2019   Transaminitis 11/10/2019   PCP:  Hillery Aldo, NP Pharmacy:   [facility] [phone] - [place], [place] - [address] AT [facility] [address] [place] [address] Phone: [phone] Fax: [phone]  [facility] [address] [place] [address] Phone: [phone] Fax: [phone]     Social Determinants of Health (SDOH) Social History: SDOH Screenings   Food Insecurity: No Food Insecurity (05/05/2023)  Housing: Low Risk  (05/05/2023)  Transportation Needs: No Transportation Needs (05/05/2023)  Utilities: Not At Risk (05/05/2023)  Tobacco Use: High Risk (05/06/2023)   SDOH Interventions:     Readmission Risk Interventions     No data to display

## 2023-05-06 NOTE — Transfer of Care (Signed)
Immediate Anesthesia Transfer of Care Note  Patient: Mark [name]  Procedure(s) Performed: HERNIA REPAIR INGUINAL ADULT with MESH (Bilateral)  Patient Location: PACU  Anesthesia Type:General  Level of Consciousness: awake, alert , and oriented  Airway & Oxygen Therapy: Patient Spontanous Breathing and Patient connected to face mask oxygen  Post-op Assessment: Report given to RN, Post -op Vital signs reviewed and stable, and Patient moving all extremities X 4  Post vital signs: Reviewed and stable  Last Vitals:  Vitals Value Taken Time  BP 189/106 05/06/23 1310  Temp    Pulse 77   Resp 17 05/06/23 1311  SpO2 100   Vitals shown include unfiled device data.  Last Pain:  Vitals:   05/06/23 0947  TempSrc: Oral  PainSc: 0-No pain      Patients Stated Pain Goal: 4 (05/06/23 0947)  Complications: No notable events documented.

## 2023-05-06 NOTE — Hospital Course (Addendum)
72 y.o.m  w/ hx of asthma, COPD, etoh Abuse, tobacco abuse, bilateral inguinal hernia, a.fib on Eliquis, systolic CHF last echo 9/23- 50-55% lvef presented with constipation x 3 days also noticing that inguinal hernia is getting bit bigger and painful. Reports daily EtOH use but no hx of DTs has chronic essential tremmor worse in right hand.  He has not had Eliquis for 2 days. In the ED: Vitals BP 170s, afebrile,UDS- THC+, etoh <20,  ast 44, alt 36. K 3.2. CT scan >>large  large right inguinal hernia which contains the cecum, part of the ascending colon, the appendix and distal ileal small bowel loops. Cxr>>irregular opacity right upper lobe scaring infectious or inflammatory process. CT chest ordered> showed extensive scarring in the right upper lobe, no suspicious lung nodules, also showed evidence of mild bronchitis and chronic bronchitis and subacute left eighth through12th rib fracture. Patient echo showed reduction in EF to 35 to 25% G1 DD, and was having uncontrolled hypertension cardiology consulted for GDMT and cardiomyopathy. 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 Informed by surgery today patient okay for discharge today When seen this am, the patient patient requesting for discharge as soon as possible

## 2023-05-06 NOTE — Anesthesia Preprocedure Evaluation (Addendum)
Anesthesia Evaluation  Patient identified by MRN, date of birth, ID band Patient awake    Reviewed: Allergy & Precautions, NPO status , Patient's Chart, lab work & pertinent test results  Airway Mallampati: II  TM Distance: >3 FB Neck ROM: Full    Dental  (+) Dental Advisory Given, Teeth Intact   Pulmonary asthma , COPD,  COPD inhaler, Current Smoker and Patient abstained from smoking.   Pulmonary exam normal breath sounds clear to auscultation       Cardiovascular hypertension, Pt. on medications +CHF  Normal cardiovascular exam+ dysrhythmias Atrial Fibrillation  Rhythm:Regular Rate:Normal  TTE 2024 EF 40%, no significant valvular abnormalities   Neuro/Psych negative neurological ROS  negative psych ROS   GI/Hepatic negative GI ROS,,,(+)     substance abuse  alcohol use  Endo/Other  negative endocrine ROS    Renal/GU negative Renal ROS  negative genitourinary   Musculoskeletal negative musculoskeletal ROS (+)    Abdominal   Peds  Hematology negative hematology ROS (+)   Anesthesia Other Findings   Reproductive/Obstetrics                             Anesthesia Physical Anesthesia Plan  ASA: 3  Anesthesia Plan: General and Regional   Post-op Pain Management: Regional block* and Tylenol PO (pre-op)*   Induction: Intravenous  PONV Risk Score and Plan: 1 and Dexamethasone, Ondansetron and Treatment may vary due to age or medical condition  Airway Management Planned: Oral ETT  Additional Equipment:   Intra-op Plan:   Post-operative Plan: Extubation in OR  Informed Consent: I have reviewed the patients History and Physical, chart, labs and discussed the procedure including the risks, benefits and alternatives for the proposed anesthesia with the patient or authorized representative who has indicated his/her understanding and acceptance.     Dental advisory given  Plan  Discussed with: CRNA  Anesthesia Plan Comments:        Anesthesia Quick Evaluation

## 2023-05-06 NOTE — Anesthesia Procedure Notes (Signed)
Anesthesia Regional Block: Quadratus lumborum   Pre-Anesthetic Checklist: , timeout performed,  Correct Patient, Correct Site, Correct Laterality,  Correct Procedure, Correct Position, site marked,  Risks and benefits discussed,  Pre-op evaluation,  At surgeon's request and post-op pain management  Laterality: Left  Prep: Maximum Sterile Barrier Precautions used, chloraprep       Needles:  Injection technique: Single-shot  Needle Type: Echogenic Stimulator Needle     Needle Length: 9cm  Needle Gauge: 21     Additional Needles:   Procedures:,,,, ultrasound used (permanent image in chart),,    Narrative:  Start time: 05/06/2023 12:02 PM End time: 05/06/2023 12:05 PM Injection made incrementally with aspirations every 5 mL. Anesthesiologist: Elmer Picker, MD

## 2023-05-07 ENCOUNTER — Other Ambulatory Visit: Payer: Self-pay

## 2023-05-07 ENCOUNTER — Other Ambulatory Visit (HOSPITAL_COMMUNITY): Payer: Self-pay

## 2023-05-07 ENCOUNTER — Encounter (HOSPITAL_COMMUNITY): Payer: Self-pay | Admitting: Surgery

## 2023-05-07 DIAGNOSIS — K4021 Bilateral inguinal hernia, without obstruction or gangrene, recurrent: Secondary | ICD-10-CM | POA: Diagnosis not present

## 2023-05-07 LAB — CBC
HCT: 37 % — ABNORMAL LOW (ref 39.0–52.0)
Hemoglobin: 12.6 g/dL — ABNORMAL LOW (ref 13.0–17.0)
MCH: 34 pg (ref 26.0–34.0)
MCHC: 34.1 g/dL (ref 30.0–36.0)
MCV: 99.7 fL (ref 80.0–100.0)
Platelets: 250 10*3/uL (ref 150–400)
RBC: 3.71 MIL/uL — ABNORMAL LOW (ref 4.22–5.81)
RDW: 11.9 % (ref 11.5–15.5)
WBC: 12.2 10*3/uL — ABNORMAL HIGH (ref 4.0–10.5)
nRBC: 0 % (ref 0.0–0.2)

## 2023-05-07 LAB — BASIC METABOLIC PANEL
Anion gap: 10 (ref 5–15)
BUN: 10 mg/dL (ref 8–23)
CO2: 23 mmol/L (ref 22–32)
Calcium: 9 mg/dL (ref 8.9–10.3)
Chloride: 101 mmol/L (ref 98–111)
Creatinine, Ser: 0.84 mg/dL (ref 0.61–1.24)
GFR, Estimated: >60 mL/min (ref >60)
Glucose, Bld: 181 mg/dL — ABNORMAL HIGH (ref 70–99)
Potassium: 4 mmol/L (ref 3.5–5.1)
Sodium: 134 mmol/L — ABNORMAL LOW (ref 135–145)

## 2023-05-07 MED ORDER — CARVEDILOL 12.5 MG PO TABS
12.5000 mg | ORAL_TABLET | Freq: Two times a day (BID) | ORAL | Status: DC
  Administered 2023-05-08: 12.5 mg via ORAL
  Filled 2023-05-07: qty 1

## 2023-05-07 MED ORDER — ENSURE ENLIVE PO LIQD
237.0000 mL | Freq: Two times a day (BID) | ORAL | Status: DC
  Administered 2023-05-07 – 2023-05-08 (×2): 237 mL via ORAL

## 2023-05-07 NOTE — Progress Notes (Signed)
PROGRESS NOTE Mark [name]  NFA:[id number] DOB: 1951/01/26 DOA: 05/05/2023 PCP: Hillery Aldo, NP  Brief Narrative/Hospital Course: 72 y.o.m  w/ hx of asthma, COPD, etoh Abuse, tobacco abuse, bilateral inguinal hernia, a.fib on Eliquis, systolic CHF last echo 9/23- 50-55% lvef presented with constipation x 3 days also noticing that inguinal hernia is getting bit bigger and painful. Reports daily EtOH use but no hx of DTs has chronic essential tremmor worse in right hand.  He has not had Eliquis for 2 days. In the ED: Vitals BP 170s, afebrile,UDS- THC+, etoh <20,  ast 44, alt 36. K 3.2. CT scan >>large  large right inguinal hernia which contains the cecum, part of the ascending colon, the appendix and distal ileal small bowel loops. Cxr>>irregular opacity right upper lobe scaring infectious or inflammatory process. CT chest ordered> showed extensive scarring in the right upper lobe, no suspicious lung nodules, also showed evidence of mild bronchitis and chronic bronchitis and subacute left eighth through12th rib fracture     Subjective: Patient seen and examined  Alert awake oriented resting comfortably on room air  But still irritated on asking questions-reports he does not feel like answering all the questions Labs stable with leukocytosis and chronically but no other complaints  Assessment and Plan: Principal Problem:   Inguinal hernia Active Problems:   Tremor   Transaminitis   EtOH dependence (HCC)   COPD (chronic obstructive pulmonary disease) (HCC)   Chronic systolic CHF (congestive heart failure) (HCC)   Paroxysmal atrial fibrillation (HCC)   Bedbug bite   Chest mass  Large bilateral inguinal hernia with pain Bilateral open inguinal hernia repair with mesh 10/28: Continue to ambulate, continue pain control, surgery following, appreciate input  Hypertension: He appears to be noncompliant.  Blood pressure now well-controlled, on Cardizem 360mg > will switch to Coreg 12.5  twice daily from 3/30. Cont iv hydralazine.  Chronic systolic CHF: Last lvef 50-55%.  BNP stable volume status euvolemic.  Echo 10/22 shows EF 30-35%, G1 DD , previously 2023 September EF 43 dL percent.  Will consult cardiology likely benefit with transition from Cardizem to beta-blocker.   PAF: In NSR.  Has not been taking Eliquis several days before admission resume metoprolol instead of Cardizem , once okay with surgery resume Eliquis   Hypokalemia: Replaced. Monitor Recent Labs  Lab 05/05/23 1430 05/05/23 1444 05/06/23 0500 05/07/23 0613  K 3.8 3.9 3.2* 4.0    Mild transaminitis: Likely from EtOH, trend labs nd improving AST/ALT  Alcohol dependence: At risk of withdrawal, continue folate, thiamine and watch for withdrawals on CIW scale  Chronic essential tremor right worse than left at baseline likely benefit from long-term beta-blocker   COPD Asthma: Not in exacerbation. PTA on symbicort- switch to Brovana/Pulmicort/Yupleri   Bedbug bite: Contact precautions   Chest mass/Abnormal chest x-ray: CT chest ordered as scarring and no mass noted has diffuse bronchial wall thickening consistent with chronic bronchitis, subacute fracture of the left 8th through 12th ribs  DVT prophylaxis: SCDs Start: 05/05/23 2214 Code Status:   Code Status: Full Code Family Communication: plan of care discussed with patient at bedside. Patient status is: Inpatient because of inguinal hernia Level of care: Progressive   Dispo: The patient is from: home            Anticipated disposition: home tbd Objective: Vitals last 24 hrs: Vitals:   05/07/23 0458 05/07/23 0738 05/07/23 0901 05/07/23 1233  BP: (!) 149/99  (!) 147/85 (!) 143/77  Pulse: 64  74 66  83.6 mL/min (by C-G formula based on SCr of 0.84 mg/dL). Liver Function Tests: Recent Labs  Lab 05/05/23 1430 05/06/23 0500  AST 71* 44*  ALT 49* 36  ALKPHOS 110 91  BILITOT 1.2 1.1  PROT 7.7 6.4*  ALBUMIN 3.8 3.1*   Recent Labs  Lab 05/05/23 1430  LIPASE 31   Recent Labs  Lab 05/05/23 2147  AMMONIA 22   Coagulation Profile: Recent Labs  Lab 05/05/23 2147  INR 1.0  No results for input(s): "TSH", "T4TOTAL", "FREET4", "T3FREE", "THYROIDAB" in the last 72 hours. Sepsis Labs: Recent Labs  Lab 05/05/23 1445  LATICACIDVEN 1.9    No results found for this or any previous visit (from the past 240 hour(s)).  Antimicrobials: Anti-infectives (From admission, onward)    Start     Dose/Rate Route Frequency Ordered Stop   05/06/23 0915  ceFAZolin (ANCEF) IVPB 2g/100 mL premix        2 g 200 mL/hr over 30 Minutes Intravenous On call to O.R. 05/06/23 0824 05/06/23 1226      Culture/Microbiology    Component Value Date/Time   SDES URINE, CATHETERIZED 03/09/2022 1735   SPECREQUEST NONE 03/09/2022 1735   CULT  03/09/2022 1735    NO GROWTH Performed at [facility], [address], [place], [place] [address]    REPTSTATUS 03/11/2022 FINAL 03/09/2022 1735    Radiology Studies: ECHOCARDIOGRAM COMPLETE  Result Date: 05/06/2023    ECHOCARDIOGRAM REPORT   Patient Name:   [facility] Date of Exam: 05/06/2023 Medical Rec #:  [id number]       Height:       74.0 in Accession #:    [id number]      Weight:       159.2 lb Date of Birth:   [birth year]       BSA:          1.972 m Patient Age:    72 years        BP:           181/101 mmHg Patient Gender: M               HR:           68 bpm. Exam Location:  Inpatient Procedure: 2D Echo, Cardiac Doppler and Color Doppler Indications:    Pre-Op  History:        Patient has prior history of Echocardiogram examinations, most                 recent 03/11/2022. Risk Factors:Hypertension.  Sonographer:    Darlys Gales Referring Phys: [id number] ANASTASSIA DOUTOVA IMPRESSIONS  1. Left ventricular ejection fraction, by estimation, is 30 to 35%. The left ventricle has moderately decreased function. The left ventricle demonstrates global hypokinesis. Left ventricular diastolic parameters are consistent with Grade I diastolic dysfunction (impaired relaxation).  2. Right ventricular systolic function is normal. The right ventricular size is normal.  3. The mitral valve is normal in structure. Trivial mitral valve regurgitation. No evidence of mitral stenosis.  4. The aortic valve is tricuspid. There is mild calcification of the aortic valve. Aortic valve regurgitation is not visualized. Aortic valve sclerosis/calcification is present, without any evidence of aortic stenosis.  5. The inferior vena cava is normal in size with greater than 50% respiratory variability, suggesting right atrial pressure of 3 mmHg. FINDINGS  Left Ventricle: Left ventricular ejection fraction, by estimation, is 30 to 35%. The left ventricle has moderately decreased function. The left ventricle demonstrates  83.6 mL/min (by C-G formula based on SCr of 0.84 mg/dL). Liver Function Tests: Recent Labs  Lab 05/05/23 1430 05/06/23 0500  AST 71* 44*  ALT 49* 36  ALKPHOS 110 91  BILITOT 1.2 1.1  PROT 7.7 6.4*  ALBUMIN 3.8 3.1*   Recent Labs  Lab 05/05/23 1430  LIPASE 31   Recent Labs  Lab 05/05/23 2147  AMMONIA 22   Coagulation Profile: Recent Labs  Lab 05/05/23 2147  INR 1.0  No results for input(s): "TSH", "T4TOTAL", "FREET4", "T3FREE", "THYROIDAB" in the last 72 hours. Sepsis Labs: Recent Labs  Lab 05/05/23 1445  LATICACIDVEN 1.9    No results found for this or any previous visit (from the past 240 hour(s)).  Antimicrobials: Anti-infectives (From admission, onward)    Start     Dose/Rate Route Frequency Ordered Stop   05/06/23 0915  ceFAZolin (ANCEF) IVPB 2g/100 mL premix        2 g 200 mL/hr over 30 Minutes Intravenous On call to O.R. 05/06/23 0824 05/06/23 1226      Culture/Microbiology    Component Value Date/Time   SDES URINE, CATHETERIZED 03/09/2022 1735   SPECREQUEST NONE 03/09/2022 1735   CULT  03/09/2022 1735    NO GROWTH Performed at [facility], [address], [place], [place] [address]    REPTSTATUS 03/11/2022 FINAL 03/09/2022 1735    Radiology Studies: ECHOCARDIOGRAM COMPLETE  Result Date: 05/06/2023    ECHOCARDIOGRAM REPORT   Patient Name:   [facility] Date of Exam: 05/06/2023 Medical Rec #:  [id number]       Height:       74.0 in Accession #:    [id number]      Weight:       159.2 lb Date of Birth:   [birth year]       BSA:          1.972 m Patient Age:    72 years        BP:           181/101 mmHg Patient Gender: M               HR:           68 bpm. Exam Location:  Inpatient Procedure: 2D Echo, Cardiac Doppler and Color Doppler Indications:    Pre-Op  History:        Patient has prior history of Echocardiogram examinations, most                 recent 03/11/2022. Risk Factors:Hypertension.  Sonographer:    Darlys Gales Referring Phys: [id number] ANASTASSIA DOUTOVA IMPRESSIONS  1. Left ventricular ejection fraction, by estimation, is 30 to 35%. The left ventricle has moderately decreased function. The left ventricle demonstrates global hypokinesis. Left ventricular diastolic parameters are consistent with Grade I diastolic dysfunction (impaired relaxation).  2. Right ventricular systolic function is normal. The right ventricular size is normal.  3. The mitral valve is normal in structure. Trivial mitral valve regurgitation. No evidence of mitral stenosis.  4. The aortic valve is tricuspid. There is mild calcification of the aortic valve. Aortic valve regurgitation is not visualized. Aortic valve sclerosis/calcification is present, without any evidence of aortic stenosis.  5. The inferior vena cava is normal in size with greater than 50% respiratory variability, suggesting right atrial pressure of 3 mmHg. FINDINGS  Left Ventricle: Left ventricular ejection fraction, by estimation, is 30 to 35%. The left ventricle has moderately decreased function. The left ventricle demonstrates  PROGRESS NOTE Mark [name]  NFA:[id number] DOB: 1951/01/26 DOA: 05/05/2023 PCP: Hillery Aldo, NP  Brief Narrative/Hospital Course: 72 y.o.m  w/ hx of asthma, COPD, etoh Abuse, tobacco abuse, bilateral inguinal hernia, a.fib on Eliquis, systolic CHF last echo 9/23- 50-55% lvef presented with constipation x 3 days also noticing that inguinal hernia is getting bit bigger and painful. Reports daily EtOH use but no hx of DTs has chronic essential tremmor worse in right hand.  He has not had Eliquis for 2 days. In the ED: Vitals BP 170s, afebrile,UDS- THC+, etoh <20,  ast 44, alt 36. K 3.2. CT scan >>large  large right inguinal hernia which contains the cecum, part of the ascending colon, the appendix and distal ileal small bowel loops. Cxr>>irregular opacity right upper lobe scaring infectious or inflammatory process. CT chest ordered> showed extensive scarring in the right upper lobe, no suspicious lung nodules, also showed evidence of mild bronchitis and chronic bronchitis and subacute left eighth through12th rib fracture     Subjective: Patient seen and examined  Alert awake oriented resting comfortably on room air  But still irritated on asking questions-reports he does not feel like answering all the questions Labs stable with leukocytosis and chronically but no other complaints  Assessment and Plan: Principal Problem:   Inguinal hernia Active Problems:   Tremor   Transaminitis   EtOH dependence (HCC)   COPD (chronic obstructive pulmonary disease) (HCC)   Chronic systolic CHF (congestive heart failure) (HCC)   Paroxysmal atrial fibrillation (HCC)   Bedbug bite   Chest mass  Large bilateral inguinal hernia with pain Bilateral open inguinal hernia repair with mesh 10/28: Continue to ambulate, continue pain control, surgery following, appreciate input  Hypertension: He appears to be noncompliant.  Blood pressure now well-controlled, on Cardizem 360mg > will switch to Coreg 12.5  twice daily from 3/30. Cont iv hydralazine.  Chronic systolic CHF: Last lvef 50-55%.  BNP stable volume status euvolemic.  Echo 10/22 shows EF 30-35%, G1 DD , previously 2023 September EF 43 dL percent.  Will consult cardiology likely benefit with transition from Cardizem to beta-blocker.   PAF: In NSR.  Has not been taking Eliquis several days before admission resume metoprolol instead of Cardizem , once okay with surgery resume Eliquis   Hypokalemia: Replaced. Monitor Recent Labs  Lab 05/05/23 1430 05/05/23 1444 05/06/23 0500 05/07/23 0613  K 3.8 3.9 3.2* 4.0    Mild transaminitis: Likely from EtOH, trend labs nd improving AST/ALT  Alcohol dependence: At risk of withdrawal, continue folate, thiamine and watch for withdrawals on CIW scale  Chronic essential tremor right worse than left at baseline likely benefit from long-term beta-blocker   COPD Asthma: Not in exacerbation. PTA on symbicort- switch to Brovana/Pulmicort/Yupleri   Bedbug bite: Contact precautions   Chest mass/Abnormal chest x-ray: CT chest ordered as scarring and no mass noted has diffuse bronchial wall thickening consistent with chronic bronchitis, subacute fracture of the left 8th through 12th ribs  DVT prophylaxis: SCDs Start: 05/05/23 2214 Code Status:   Code Status: Full Code Family Communication: plan of care discussed with patient at bedside. Patient status is: Inpatient because of inguinal hernia Level of care: Progressive   Dispo: The patient is from: home            Anticipated disposition: home tbd Objective: Vitals last 24 hrs: Vitals:   05/07/23 0458 05/07/23 0738 05/07/23 0901 05/07/23 1233  BP: (!) 149/99  (!) 147/85 (!) 143/77  Pulse: 64  74 66  83.6 mL/min (by C-G formula based on SCr of 0.84 mg/dL). Liver Function Tests: Recent Labs  Lab 05/05/23 1430 05/06/23 0500  AST 71* 44*  ALT 49* 36  ALKPHOS 110 91  BILITOT 1.2 1.1  PROT 7.7 6.4*  ALBUMIN 3.8 3.1*   Recent Labs  Lab 05/05/23 1430  LIPASE 31   Recent Labs  Lab 05/05/23 2147  AMMONIA 22   Coagulation Profile: Recent Labs  Lab 05/05/23 2147  INR 1.0  No results for input(s): "TSH", "T4TOTAL", "FREET4", "T3FREE", "THYROIDAB" in the last 72 hours. Sepsis Labs: Recent Labs  Lab 05/05/23 1445  LATICACIDVEN 1.9    No results found for this or any previous visit (from the past 240 hour(s)).  Antimicrobials: Anti-infectives (From admission, onward)    Start     Dose/Rate Route Frequency Ordered Stop   05/06/23 0915  ceFAZolin (ANCEF) IVPB 2g/100 mL premix        2 g 200 mL/hr over 30 Minutes Intravenous On call to O.R. 05/06/23 0824 05/06/23 1226      Culture/Microbiology    Component Value Date/Time   SDES URINE, CATHETERIZED 03/09/2022 1735   SPECREQUEST NONE 03/09/2022 1735   CULT  03/09/2022 1735    NO GROWTH Performed at [facility], [address], [place], [place] [address]    REPTSTATUS 03/11/2022 FINAL 03/09/2022 1735    Radiology Studies: ECHOCARDIOGRAM COMPLETE  Result Date: 05/06/2023    ECHOCARDIOGRAM REPORT   Patient Name:   [facility] Date of Exam: 05/06/2023 Medical Rec #:  [id number]       Height:       74.0 in Accession #:    [id number]      Weight:       159.2 lb Date of Birth:   [birth year]       BSA:          1.972 m Patient Age:    72 years        BP:           181/101 mmHg Patient Gender: M               HR:           68 bpm. Exam Location:  Inpatient Procedure: 2D Echo, Cardiac Doppler and Color Doppler Indications:    Pre-Op  History:        Patient has prior history of Echocardiogram examinations, most                 recent 03/11/2022. Risk Factors:Hypertension.  Sonographer:    Darlys Gales Referring Phys: [id number] ANASTASSIA DOUTOVA IMPRESSIONS  1. Left ventricular ejection fraction, by estimation, is 30 to 35%. The left ventricle has moderately decreased function. The left ventricle demonstrates global hypokinesis. Left ventricular diastolic parameters are consistent with Grade I diastolic dysfunction (impaired relaxation).  2. Right ventricular systolic function is normal. The right ventricular size is normal.  3. The mitral valve is normal in structure. Trivial mitral valve regurgitation. No evidence of mitral stenosis.  4. The aortic valve is tricuspid. There is mild calcification of the aortic valve. Aortic valve regurgitation is not visualized. Aortic valve sclerosis/calcification is present, without any evidence of aortic stenosis.  5. The inferior vena cava is normal in size with greater than 50% respiratory variability, suggesting right atrial pressure of 3 mmHg. FINDINGS  Left Ventricle: Left ventricular ejection fraction, by estimation, is 30 to 35%. The left ventricle has moderately decreased function. The left ventricle demonstrates  PROGRESS NOTE Mark [name]  NFA:[id number] DOB: 1951/01/26 DOA: 05/05/2023 PCP: Hillery Aldo, NP  Brief Narrative/Hospital Course: 72 y.o.m  w/ hx of asthma, COPD, etoh Abuse, tobacco abuse, bilateral inguinal hernia, a.fib on Eliquis, systolic CHF last echo 9/23- 50-55% lvef presented with constipation x 3 days also noticing that inguinal hernia is getting bit bigger and painful. Reports daily EtOH use but no hx of DTs has chronic essential tremmor worse in right hand.  He has not had Eliquis for 2 days. In the ED: Vitals BP 170s, afebrile,UDS- THC+, etoh <20,  ast 44, alt 36. K 3.2. CT scan >>large  large right inguinal hernia which contains the cecum, part of the ascending colon, the appendix and distal ileal small bowel loops. Cxr>>irregular opacity right upper lobe scaring infectious or inflammatory process. CT chest ordered> showed extensive scarring in the right upper lobe, no suspicious lung nodules, also showed evidence of mild bronchitis and chronic bronchitis and subacute left eighth through12th rib fracture     Subjective: Patient seen and examined  Alert awake oriented resting comfortably on room air  But still irritated on asking questions-reports he does not feel like answering all the questions Labs stable with leukocytosis and chronically but no other complaints  Assessment and Plan: Principal Problem:   Inguinal hernia Active Problems:   Tremor   Transaminitis   EtOH dependence (HCC)   COPD (chronic obstructive pulmonary disease) (HCC)   Chronic systolic CHF (congestive heart failure) (HCC)   Paroxysmal atrial fibrillation (HCC)   Bedbug bite   Chest mass  Large bilateral inguinal hernia with pain Bilateral open inguinal hernia repair with mesh 10/28: Continue to ambulate, continue pain control, surgery following, appreciate input  Hypertension: He appears to be noncompliant.  Blood pressure now well-controlled, on Cardizem 360mg > will switch to Coreg 12.5  twice daily from 3/30. Cont iv hydralazine.  Chronic systolic CHF: Last lvef 50-55%.  BNP stable volume status euvolemic.  Echo 10/22 shows EF 30-35%, G1 DD , previously 2023 September EF 43 dL percent.  Will consult cardiology likely benefit with transition from Cardizem to beta-blocker.   PAF: In NSR.  Has not been taking Eliquis several days before admission resume metoprolol instead of Cardizem , once okay with surgery resume Eliquis   Hypokalemia: Replaced. Monitor Recent Labs  Lab 05/05/23 1430 05/05/23 1444 05/06/23 0500 05/07/23 0613  K 3.8 3.9 3.2* 4.0    Mild transaminitis: Likely from EtOH, trend labs nd improving AST/ALT  Alcohol dependence: At risk of withdrawal, continue folate, thiamine and watch for withdrawals on CIW scale  Chronic essential tremor right worse than left at baseline likely benefit from long-term beta-blocker   COPD Asthma: Not in exacerbation. PTA on symbicort- switch to Brovana/Pulmicort/Yupleri   Bedbug bite: Contact precautions   Chest mass/Abnormal chest x-ray: CT chest ordered as scarring and no mass noted has diffuse bronchial wall thickening consistent with chronic bronchitis, subacute fracture of the left 8th through 12th ribs  DVT prophylaxis: SCDs Start: 05/05/23 2214 Code Status:   Code Status: Full Code Family Communication: plan of care discussed with patient at bedside. Patient status is: Inpatient because of inguinal hernia Level of care: Progressive   Dispo: The patient is from: home            Anticipated disposition: home tbd Objective: Vitals last 24 hrs: Vitals:   05/07/23 0458 05/07/23 0738 05/07/23 0901 05/07/23 1233  BP: (!) 149/99  (!) 147/85 (!) 143/77  Pulse: 64  74 66

## 2023-05-07 NOTE — Progress Notes (Signed)
1 Day Post-Op   Subjective/Chief Complaint: Reports mild post op inguinal pain   Objective: Vital signs in last 24 hours: Temp:  [97.5 F (36.4 C)-98.5 F (36.9 C)] 97.9 F (36.6 C) (10/29 0901) Pulse Rate:  [61-92] 74 (10/29 0901) Resp:  [14-19] 14 (10/29 0228) BP: (134-195)/(72-115) 147/85 (10/29 0901) SpO2:  [93 %-100 %] 98 % (10/29 0901) Weight:  [72.2 kg] 72.2 kg (10/28 0947) Last BM Date : 05/02/23  Intake/Output from previous day: 10/28 0701 - 10/29 0700 In: 1040 [P.O.:240; I.V.:700; IV Piggyback:100] Out: 805 [Urine:800; Blood:5] Intake/Output this shift: No intake/output data recorded.  Exam:: Awake and alert Abdomen soft, non-distended, incisions healing well  Lab Results:  Recent Labs    05/06/23 0500 05/07/23 0613  WBC 4.7 12.2*  HGB 12.4* 12.6*  HCT 36.7* 37.0*  PLT 256 250   BMET Recent Labs    05/06/23 0500 05/07/23 0613  NA 139 134*  K 3.2* 4.0  CL 103 101  CO2 26 23  GLUCOSE 80 181*  BUN 13 10  CREATININE 0.88 0.84  CALCIUM 8.9 9.0   PT/INR Recent Labs    05/05/23 2147  LABPROT 13.4  INR 1.0   ABG No results for input(s): "PHART", "HCO3" in the last 72 hours.  Invalid input(s): "PCO2", "PO2"  Studies/Results: ECHOCARDIOGRAM COMPLETE  Result Date: 05/06/2023    ECHOCARDIOGRAM REPORT   Patient Name:   Mark [name] Date of Exam: 05/06/2023 Medical Rec #:  [id number]       Height:       74.0 in Accession #:    [id number]      Weight:       159.2 lb Date of Birth:  10/31/1950       BSA:          1.972 m Patient Age:    72 years        BP:           181/101 mmHg Patient Gender: M               HR:           68 bpm. Exam Location:  Inpatient Procedure: 2D Echo, Cardiac Doppler and Color Doppler Indications:    Pre-Op  History:        Patient has prior history of Echocardiogram examinations, most                 recent 03/11/2022. Risk Factors:Hypertension.  Sonographer:    Darlys Gales Referring Phys: [id number] ANASTASSIA DOUTOVA IMPRESSIONS   1. Left ventricular ejection fraction, by estimation, is 30 to 35%. The left ventricle has moderately decreased function. The left ventricle demonstrates global hypokinesis. Left ventricular diastolic parameters are consistent with Grade I diastolic dysfunction (impaired relaxation).  2. Right ventricular systolic function is normal. The right ventricular size is normal.  3. The mitral valve is normal in structure. Trivial mitral valve regurgitation. No evidence of mitral stenosis.  4. The aortic valve is tricuspid. There is mild calcification of the aortic valve. Aortic valve regurgitation is not visualized. Aortic valve sclerosis/calcification is present, without any evidence of aortic stenosis.  5. The inferior vena cava is normal in size with greater than 50% respiratory variability, suggesting right atrial pressure of 3 mmHg. FINDINGS  Left Ventricle: Left ventricular ejection fraction, by estimation, is 30 to 35%. The left ventricle has moderately decreased function. The left ventricle demonstrates global hypokinesis. The left ventricular internal cavity size was normal in size. There is  no left ventricular hypertrophy. Left ventricular diastolic parameters are consistent with Grade I diastolic dysfunction (impaired relaxation). Right Ventricle: The right ventricular size is normal. No increase in right ventricular wall thickness. Right ventricular systolic function is normal. Left Atrium: Left atrial size was normal in size. Right Atrium: Right atrial size was normal in size. Pericardium: There is no evidence of pericardial effusion. Mitral Valve: The mitral valve is normal in structure. Trivial mitral valve regurgitation. No evidence of mitral valve stenosis. Tricuspid Valve: The tricuspid valve is normal in structure. Tricuspid valve regurgitation is not demonstrated. No evidence of tricuspid stenosis. Aortic Valve: The aortic valve is tricuspid. There is mild calcification of the aortic valve. Aortic valve  regurgitation is not visualized. Aortic valve sclerosis/calcification is present, without any evidence of aortic stenosis. Aortic valve mean gradient measures 3.0 mmHg. Aortic valve peak gradient measures 5.6 mmHg. Aortic valve area, by VTI measures 2.34 cm. Pulmonic Valve: The pulmonic valve was not well visualized. Pulmonic valve regurgitation is not visualized. No evidence of pulmonic stenosis. Aorta: The aortic root is normal in size and structure. Venous: The inferior vena cava is normal in size with greater than 50% respiratory variability, suggesting right atrial pressure of 3 mmHg. IAS/Shunts: No atrial level shunt detected by color flow Doppler.  LEFT VENTRICLE PLAX 2D LVIDd:         4.60 cm   Diastology LVIDs:         3.80 cm   LV e' medial:    4.35 cm/s LV PW:         1.00 cm   LV E/e' medial:  8.9 LV IVS:        0.90 cm   LV e' lateral:   4.24 cm/s LVOT diam:     2.00 cm   LV E/e' lateral: 9.1 LV SV:         59 LV SV Index:   30 LVOT Area:     3.14 cm  RIGHT VENTRICLE RV S prime:     8.81 cm/s TAPSE (M-mode): 1.9 cm LEFT ATRIUM             Index        RIGHT ATRIUM           Index LA Vol (A2C):   58.9 ml 29.86 ml/m  RA Area:     11.70 cm LA Vol (A4C):   25.1 ml 12.73 ml/m  RA Volume:   24.20 ml  12.27 ml/m LA Biplane Vol: 38.5 ml 19.52 ml/m  AORTIC VALVE AV Area (Vmax):    2.64 cm AV Area (Vmean):   2.29 cm AV Area (VTI):     2.34 cm AV Vmax:           118.00 cm/s AV Vmean:          90.000 cm/s AV VTI:            0.252 m AV Peak Grad:      5.6 mmHg AV Mean Grad:      3.0 mmHg LVOT Vmax:         99.20 cm/s LVOT Vmean:        65.600 cm/s LVOT VTI:          0.188 m LVOT/AV VTI ratio: 0.75 MITRAL VALVE MV Area (PHT): 4.54 cm    SHUNTS MV Decel Time: 167 msec    Systemic VTI:  0.19 m MV E velocity: 38.60 cm/s  Systemic Diam: 2.00 cm MV A velocity: 75.00  cm/s MV E/A ratio:  0.51 Arvilla Meres MD Electronically signed by Arvilla Meres MD Signature Date/Time: 05/06/2023/2:44:04 PM    Final     CT CHEST W CONTRAST  Result Date: 05/06/2023 CLINICAL DATA:  Abnormal xray - lung nodule, >= 1 cm. EXAM: CT CHEST WITH CONTRAST TECHNIQUE: Multidetector CT imaging of the chest was performed during intravenous contrast administration. RADIATION DOSE REDUCTION: This exam was performed according to the departmental dose-optimization program which includes automated exposure control, adjustment of the mA and/or kV according to patient size and/or use of iterative reconstruction technique. CONTRAST:  75mL OMNIPAQUE IOHEXOL 300 MG/ML  SOLN COMPARISON:  Chest radiograph 05/05/2023. FINDINGS: Cardiovascular: Normal heart size. No pericardial effusion. Atherosclerotic calcifications of the thoracic aorta and coronary arteries. Mediastinum/Nodes: No enlarged mediastinal, hilar, or axillary lymph nodes. Thyroid gland, trachea, and esophagus demonstrate no significant findings. Lungs/Pleura: Extensive scarring in the right upper lobe. No suspicious lung nodules, consolidation or pulmonary edema. Diffuse bronchial wall thickening and mild bronchiectasis. No pleural effusion or pneumothorax. Upper Abdomen: No acute findings. Musculoskeletal: No chest wall abnormality. Subacute fractures of the left eighth through twelfth ribs. IMPRESSION: 1. Extensive scarring in the right upper lobe. No suspicious lung nodules. 2. Diffuse bronchial wall thickening and mild bronchiectasis, consistent with chronic bronchitis. 3. Subacute fractures of the left eighth through twelfth ribs. Aortic Atherosclerosis (ICD10-I70.0). Electronically Signed   By: Orvan Falconer M.D.   On: 05/06/2023 11:40   DG Chest Portable 1 View  Result Date: 05/05/2023 CLINICAL DATA:  Plan surgery.  COPD. EXAM: PORTABLE CHEST 1 VIEW COMPARISON:  03/31/2023. FINDINGS: The heart is enlarged and the mediastinal contour is within normal limits. There is atherosclerotic calcification of the aorta. Hyperinflation of the lungs is noted. An irregular opacity is  present in the right upper lobe and unchanged from the prior exam. No consolidation, effusion, or pneumothorax. No acute osseous abnormality. IMPRESSION: 1. Stable irregular opacity in the right upper lobe, which may reflect scarring, infectious, or inflammatory process. Underlying neoplasm is not excluded. CT chest is recommended for further evaluation. 2. Hyperinflation of the lungs. Electronically Signed   By: Thornell Sartorius M.D.   On: 05/05/2023 20:06   CT ABDOMEN PELVIS W CONTRAST  Result Date: 05/05/2023 CLINICAL DATA:  Right groin pain.  Known inguinal hernia. EXAM: CT ABDOMEN AND PELVIS WITH CONTRAST TECHNIQUE: Multidetector CT imaging of the abdomen and pelvis was performed using the standard protocol following bolus administration of intravenous contrast. RADIATION DOSE REDUCTION: This exam was performed according to the departmental dose-optimization program which includes automated exposure control, adjustment of the mA and/or kV according to patient size and/or use of iterative reconstruction technique. CONTRAST:  OMNIPAQUE IOHEXOL 300 MG/ML  SOLN COMPARISON:  CT scan 01/15/2023 FINDINGS: Lower chest: The lung bases are clear of acute process. No pleural effusion or pulmonary lesions. The heart is normal in size. No pericardial effusion. The distal esophagus and aorta are unremarkable. Hepatobiliary: Diffuse fatty infiltration of the liver. 2 cm lesion in segment 4 B demonstrates peripheral nodular enhancement and is consistent with a benign hemangioma. Mild gallbladder contraction. No biliary dilatation. Pancreas: Unremarkable. No pancreatic ductal dilatation or surrounding inflammatory changes. Spleen: Normal in size without focal abnormality. Adrenals/Urinary Tract: Adrenal glands are unremarkable. Kidneys are normal, without renal calculi, focal lesion, or hydronephrosis. Bladder is unremarkable. Stomach/Bowel: The stomach, duodenum, small bowel and colon are unremarkable. No acute  inflammatory changes, mass lesions obstructive findings. The terminal ileum and appendix are normal. Vascular/Lymphatic: Stable atherosclerotic calcifications  involving the aorta and iliac arteries but no aneurysm or dissection. The major venous structures are patent. No mesenteric or retroperitoneal mass or adenopathy. Reproductive: The prostate gland and seminal vesicles are unremarkable. Other: Very large right inguinal hernia which contains the cecum, part of the ascending colon, the appendix and distal ileal small bowel loops. No findings for incarceration or obstruction. Small left inguinal hernia containing a small portion of the sigmoid colon but no findings for obstruction or incarceration. Musculoskeletal: No significant bony findings. Remote L1 compression fracture and stable degenerative changes involving the spine and hips. IMPRESSION: 1. Very large right inguinal hernia which contains the cecum, part of the ascending colon, the appendix and distal ileal small bowel loops. No findings for incarceration or obstruction. 2. Small left inguinal hernia containing a small portion of the sigmoid colon but no findings for obstruction or incarceration. 3. Diffuse fatty infiltration of the liver. 4. 2 cm benign appearing hemangioma in segment 4B of the liver. Aortic Atherosclerosis (ICD10-I70.0). Electronically Signed   By: Rudie Meyer M.D.   On: 05/05/2023 18:50    Anti-infectives: Anti-infectives (From admission, onward)    Start     Dose/Rate Route Frequency Ordered Stop   05/06/23 0915  ceFAZolin (ANCEF) IVPB 2g/100 mL premix        2 g 200 mL/hr over 30 Minutes Intravenous On call to O.R. 05/06/23 0824 05/06/23 1226       Assessment/Plan: S/p bilateral open inguinal hernia repair with mesh  POD#1  Pain control Ok to ambulate Appreciate Hospitalist's care    LOS: 2 days    Abigail Miyamoto MD 05/07/2023

## 2023-05-07 NOTE — TOC Initial Note (Signed)
Transition of Care [facility]) - Initial/Assessment Note    Patient Details  Name: Mark [name] MRN: [id number] Date of Birth: 10-Feb-1951  [facility]) CM/SW Contact:    Howell Rucks, RN Phone Number: 05/07/2023, 10:58 AM  Clinical Narrative:  Met with pt at bedside to introduce role of TOC/NCM and review for dc planning. Pt reports he resides alone, has support from his significant other who assist with ADL's and house chores. Pt reports he needs a caregiver, NCM educated pt on calling his Medicaid Customer Service to request home caregiver services, voiced understanding. Pt reports no current home care services or home DME, pt reports he uses public transportation or rides from friends to get around. Pt will need assistance with transport home.  TOC consult for Substance Abuse resources, pt declined. TOC will continue to follow.                  Expected Discharge Plan: Home/Self Care Barriers to Discharge: Continued Medical Work up   Patient Goals and CMS Choice Patient states their goals for this hospitalization and ongoing recovery are:: return home with support from significant other, has a son in the [employer]          Expected Discharge Plan and Services       Living arrangements for the past 2 months: Single Family Home                                      Prior Living Arrangements/Services Living arrangements for the past 2 months: Single Family Home Lives with:: Self Patient language and need for interpreter reviewed:: Yes Do you feel safe going back to the place where you live?: Yes      Need for Family Participation in Patient Care: Yes (Comment) Care giver support system in place?: Yes (comment)   Criminal Activity/Legal Involvement Pertinent to Current Situation/Hospitalization: No - Comment as needed  Activities of Daily Living   ADL Screening (condition at time of admission) Independently performs ADLs?: Yes (appropriate for  developmental age) Is the patient deaf or have difficulty hearing?: No Does the patient have difficulty seeing, even when wearing glasses/contacts?: No Does the patient have difficulty concentrating, remembering, or making decisions?: No  Permission Sought/Granted                  Emotional Assessment Appearance:: Appears stated age Attitude/Demeanor/Rapport: Gracious Affect (typically observed): Accepting Orientation: : Oriented to Self, Oriented to Place, Oriented to  Time Alcohol / Substance Use: Alcohol Use Psych Involvement: No (comment)  Admission diagnosis:  Irreducible inguinal hernia [K40.30] Inguinal hernia [K40.90] Patient Active Problem List   Diagnosis Date Noted   Inguinal hernia 05/05/2023   Chronic systolic CHF (congestive heart failure) (HCC) 05/05/2023   Paroxysmal atrial fibrillation (HCC) 05/05/2023   Bedbug bite 05/05/2023   Chest mass 05/05/2023   COPD (chronic obstructive pulmonary disease) (HCC)    Hypervolemia    Atrial fibrillation with RVR (HCC) 03/10/2022   Acute respiratory failure with hypoxia (HCC) 03/09/2022   Sepsis due to pneumonia (HCC) 03/09/2022   Elevated troponin 03/09/2022   EtOH dependence (HCC) 03/09/2022   Tobacco abuse 03/09/2022   PNA (pneumonia) 03/09/2022   ARF (acute renal failure) (HCC) 03/09/2022   Dehydration 03/09/2022   Abnormal TSH 03/09/2022   Chronic alcohol abuse    Severe persistent asthma with acute exacerbation    AKI (acute kidney  injury) (HCC)    Severe sepsis (HCC)    Asthma exacerbation 11/10/2019   Tremor 11/10/2019   Alcohol abuse with intoxication (HCC) 11/10/2019   Hypertensive urgency 11/10/2019   Transaminitis 11/10/2019   PCP:  Hillery Aldo, NP Pharmacy:   [facility] [phone] - [place], [place] - [address] AT [facility] [address] [place] [address] Phone: [phone] Fax: [phone]     Social Determinants of Health (SDOH) Social History: SDOH Screenings    Food Insecurity: No Food Insecurity (05/05/2023)  Housing: Low Risk  (05/05/2023)  Transportation Needs: No Transportation Needs (05/05/2023)  Utilities: Not At Risk (05/05/2023)  Tobacco Use: High Risk (05/06/2023)   SDOH Interventions:     Readmission Risk Interventions    05/07/2023   10:55 AM  Readmission Risk Prevention Plan  Transportation Screening Complete  PCP or Specialist Appt within 5-7 Days Complete  Home Care Screening Complete  Medication Review (RN CM) Complete

## 2023-05-07 NOTE — Plan of Care (Signed)
  Problem: Clinical Measurements: Goal: Will remain free from infection Outcome: Progressing Goal: Respiratory complications will improve Outcome: Progressing   Problem: Activity: Goal: Risk for activity intolerance will decrease Outcome: Progressing   Problem: Nutrition: Goal: Adequate nutrition will be maintained Outcome: Progressing   Problem: Pain Management: Goal: General experience of comfort will improve Outcome: Progressing   Problem: Skin Integrity: Goal: Risk for impaired skin integrity will decrease Outcome: Progressing

## 2023-05-08 ENCOUNTER — Encounter (HOSPITAL_COMMUNITY): Payer: Self-pay | Admitting: Internal Medicine

## 2023-05-08 DIAGNOSIS — K4021 Bilateral inguinal hernia, without obstruction or gangrene, recurrent: Secondary | ICD-10-CM | POA: Diagnosis not present

## 2023-05-08 DIAGNOSIS — I429 Cardiomyopathy, unspecified: Secondary | ICD-10-CM

## 2023-05-08 DIAGNOSIS — I480 Paroxysmal atrial fibrillation: Secondary | ICD-10-CM | POA: Diagnosis not present

## 2023-05-08 LAB — BASIC METABOLIC PANEL
Anion gap: 11 (ref 5–15)
BUN: 9 mg/dL (ref 8–23)
CO2: 25 mmol/L (ref 22–32)
Calcium: 9.1 mg/dL (ref 8.9–10.3)
Chloride: 102 mmol/L (ref 98–111)
Creatinine, Ser: 0.83 mg/dL (ref 0.61–1.24)
GFR, Estimated: >60 mL/min (ref >60)
Glucose, Bld: 151 mg/dL — ABNORMAL HIGH (ref 70–99)
Potassium: 3.3 mmol/L — ABNORMAL LOW (ref 3.5–5.1)
Sodium: 138 mmol/L (ref 135–145)

## 2023-05-08 MED ORDER — ACETAMINOPHEN 500 MG PO TABS: 1000.0000 mg | ORAL_TABLET | Freq: Four times a day (QID) | ORAL | Status: DC | PRN

## 2023-05-08 MED ORDER — ORAL CARE MOUTH RINSE: 15.0000 mL | OROMUCOSAL | Status: DC | PRN

## 2023-05-08 MED ORDER — POTASSIUM CHLORIDE CRYS ER 20 MEQ PO TBCR
40.0000 meq | EXTENDED_RELEASE_TABLET | Freq: Once | ORAL | Status: AC
  Administered 2023-05-08: 40 meq via ORAL
  Filled 2023-05-08: qty 2

## 2023-05-08 MED ORDER — BISACODYL 10 MG RE SUPP
10.0000 mg | Freq: Once | RECTAL | Status: DC
  Filled 2023-05-08: qty 1

## 2023-05-08 MED ORDER — TRAMADOL HCL 50 MG PO TABS: 50.0000 mg | ORAL_TABLET | Freq: Four times a day (QID) | ORAL | 0 refills | Status: DC | PRN

## 2023-05-08 MED ORDER — VITAMIN B-1 100 MG PO TABS: 100.0000 mg | ORAL_TABLET | Freq: Every day | ORAL | 0 refills | Status: AC

## 2023-05-08 MED ORDER — CARVEDILOL 12.5 MG PO TABS: 12.5000 mg | ORAL_TABLET | Freq: Two times a day (BID) | ORAL | 0 refills | Status: DC

## 2023-05-08 MED ORDER — MAGNESIUM HYDROXIDE 400 MG/5ML PO SUSP
30.0000 mL | Freq: Once | ORAL | Status: AC
  Administered 2023-05-08: 30 mL via ORAL
  Filled 2023-05-08: qty 30

## 2023-05-08 MED ORDER — FOLIC ACID 1 MG PO TABS: 1.0000 mg | ORAL_TABLET | Freq: Every day | ORAL | 0 refills | Status: AC

## 2023-05-08 NOTE — Discharge Instructions (Signed)
 UMBILICAL OR INGUINAL HERNIA REPAIR: POST OP INSTRUCTIONS  Always review your discharge instruction sheet given to you by the facility where your surgery was performed. IF YOU HAVE DISABILITY OR FAMILY LEAVE FORMS, YOU MUST BRING THEM TO THE OFFICE FOR PROCESSING.   DO NOT GIVE THEM TO YOUR DOCTOR.  A  prescription for pain medication may be given to you upon discharge.  Take your pain medication as prescribed, if needed.  If narcotic pain medicine is not needed, then you may take acetaminophen (Tylenol) or ibuprofen (Advil) as needed. Take your usually prescribed medications unless otherwise directed. If you need a refill on your pain medication, please contact your pharmacy.  They will contact our office to request authorization. Prescriptions will not be filled after 5 pm or on week-ends. You should follow a light diet the first 24 hours after arrival home, such as soup and crackers, etc.  Be sure to include lots of fluids daily.  Resume your normal diet the day after surgery. Most patients will experience some swelling and bruising around the umbilicus or in the groin and scrotum.  Ice packs and reclining will help.  Swelling and bruising can take several days to resolve.  It is common to experience some constipation if taking pain medication after surgery.  Increasing fluid intake and taking a stool softener (such as Colace) will usually help or prevent this problem from occurring.  A mild laxative (Milk of Magnesia or Miralax) should be taken according to package directions if there are no bowel movements after 48 hours. Unless discharge instructions indicate otherwise, you may remove your bandages 24-48 hours after surgery, and you may shower at that time.  You may have steri-strips (small skin tapes) in place directly over the incision.  These strips should be left on the skin for 7-10 days.  If your surgeon used skin glue on the incision, you may shower in 24 hours.  The glue will flake off  over the next 2-3 weeks.  Any sutures or staples will be removed at the office during your follow-up visit. ACTIVITIES:  You may resume regular (light) daily activities beginning the next day--such as daily self-care, walking, climbing stairs--gradually increasing activities as tolerated.  You may have sexual intercourse when it is comfortable.  Refrain from any heavy lifting or straining until approved by your doctor. You may drive when you are no longer taking prescription pain medication, you can comfortably wear a seatbelt, and you can safely maneuver your car and apply brakes. You should see your doctor in the office for a follow-up appointment approximately 2-3 weeks after your surgery.  Make sure that you call for this appointment within a day or two after you arrive home to insure a convenient appointment time.    WHEN TO CALL YOUR DOCTOR: Fever over 101.0 Inability to urinate Nausea and/or vomiting Extreme swelling or bruising Continued bleeding from incision. Increased pain, redness, or drainage from the incision  The clinic staff is available to answer your questions during regular business hours.  Please don't hesitate to call and ask to speak to one of the nurses for clinical concerns.  If you have a medical emergency, go to the nearest emergency room or call 911.  A surgeon from [facility] is always on call at the hospital   [address], [address], ,   [address] ?  [address] [address], ,    [address] [phone] ? [phone] ? FAX [phone] Web site:   [email or web address]  

## 2023-05-08 NOTE — Progress Notes (Signed)
AVS and discharge instructions reviewed w/ patient. Patient verbalized understanding. Patient discharged to home via bus pass provided by [facility].

## 2023-05-08 NOTE — Progress Notes (Signed)
Heart Failure Navigator Progress Note  Assessed for Heart & Vascular TOC clinic readiness.  Patient EF 30-35%. Admission for Hernia Repair.  Has a [facility] follow-up scheduled for 05/20/23.  Navigator will sign off at this time.  Roxy Horseman, RN, BSN [facility] Heart Failure Navigator Secure Chat Only

## 2023-05-08 NOTE — Plan of Care (Signed)

## 2023-05-08 NOTE — Discharge Summary (Signed)
Physician Discharge Summary  Mark [name] KZS:[id number] DOB: 04/07/1951 DOA: 05/05/2023  PCP: Hillery Aldo, NP  Admit date: 05/05/2023 Discharge date: 05/08/2023 Recommendations for Outpatient Follow-up:  Follow up with PCP in 1 weeks-call for appointment Please obtain BMP/CBC in one week Follow-up with surgery for postop care  Discharge Dispo: Home Discharge Condition: Stable Code Status:   Code Status: Full Code Diet recommendation:  Diet Order             Diet Heart Room service appropriate? Yes; Fluid consistency: Thin  Diet effective now                    Brief/Interim Summary: 72 y.o.m  w/ hx of asthma, COPD, etoh Abuse, tobacco abuse, bilateral inguinal hernia, a.fib on Eliquis, systolic CHF last echo 9/23- 50-55% lvef presented with constipation x 3 days also noticing that inguinal hernia is getting bit bigger and painful. Reports daily EtOH use but no hx of DTs has chronic essential tremmor worse in right hand.  He has not had Eliquis for 2 days. In the ED: Vitals BP 170s, afebrile,UDS- THC+, etoh <20,  ast 44, alt 36. K 3.2. CT scan >>large  large right inguinal hernia which contains the cecum, part of the ascending colon, the appendix and distal ileal small bowel loops. Cxr>>irregular opacity right upper lobe scaring infectious or inflammatory process. CT chest ordered> showed extensive scarring in the right upper lobe, no suspicious lung nodules, also showed evidence of mild bronchitis and chronic bronchitis and subacute left eighth through12th rib fracture. Patient echo showed reduction in EF to 35 to 25% G1 DD, and was having uncontrolled hypertension cardiology consulted for GDMT and cardiomyopathy. 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 Informed by  surgery today patient okay for discharge today When seen this am, the patient patient requesting for discharge as soon as possible   Discharge Diagnoses:  Principal Problem:   Inguinal hernia Active Problems:   Tremor   Transaminitis   EtOH dependence (HCC)   COPD (chronic obstructive pulmonary disease) (HCC)   Chronic systolic CHF (congestive heart failure) (HCC)   Paroxysmal atrial fibrillation (HCC)   Bedbug bite   Chest mass  Large bilateral inguinal hernia with pain Bilateral open inguinal hernia repair with mesh 10/28: Continue to ambulate, continue pain control, surgery following, appreciate input Per surgery okay for discharge today.  Patient did have a good bowel movement for discharge   Hypertension: He appears to be noncompliant.  Blood pressure now well-controlled, on Cardizem 360mg > switched to Coreg 12.5 twice and doing well   Cardiomyopathy, new systolic dysfunction: ?  Etiology.Last lvef 50-55%.  BNP stable volume status euvolemic.  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  Has not been taking Eliquis several days before admission -now on Coreg. Continue Eliquis on discharge   Hypokalemia: Replaced.  Mild transaminitis: Likely from EtOH, trend labs nd improving AST/ALT   Alcohol dependence: No signs of withdrawal.  Continue folate, thiamineCessation was encouraged extensively  Chronic essential tremor right worse than left at  Physician Discharge Summary  Mark [name] KZS:[id number] DOB: 04/07/1951 DOA: 05/05/2023  PCP: Hillery Aldo, NP  Admit date: 05/05/2023 Discharge date: 05/08/2023 Recommendations for Outpatient Follow-up:  Follow up with PCP in 1 weeks-call for appointment Please obtain BMP/CBC in one week Follow-up with surgery for postop care  Discharge Dispo: Home Discharge Condition: Stable Code Status:   Code Status: Full Code Diet recommendation:  Diet Order             Diet Heart Room service appropriate? Yes; Fluid consistency: Thin  Diet effective now                    Brief/Interim Summary: 72 y.o.m  w/ hx of asthma, COPD, etoh Abuse, tobacco abuse, bilateral inguinal hernia, a.fib on Eliquis, systolic CHF last echo 9/23- 50-55% lvef presented with constipation x 3 days also noticing that inguinal hernia is getting bit bigger and painful. Reports daily EtOH use but no hx of DTs has chronic essential tremmor worse in right hand.  He has not had Eliquis for 2 days. In the ED: Vitals BP 170s, afebrile,UDS- THC+, etoh <20,  ast 44, alt 36. K 3.2. CT scan >>large  large right inguinal hernia which contains the cecum, part of the ascending colon, the appendix and distal ileal small bowel loops. Cxr>>irregular opacity right upper lobe scaring infectious or inflammatory process. CT chest ordered> showed extensive scarring in the right upper lobe, no suspicious lung nodules, also showed evidence of mild bronchitis and chronic bronchitis and subacute left eighth through12th rib fracture. Patient echo showed reduction in EF to 35 to 25% G1 DD, and was having uncontrolled hypertension cardiology consulted for GDMT and cardiomyopathy. 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 Informed by  surgery today patient okay for discharge today When seen this am, the patient patient requesting for discharge as soon as possible   Discharge Diagnoses:  Principal Problem:   Inguinal hernia Active Problems:   Tremor   Transaminitis   EtOH dependence (HCC)   COPD (chronic obstructive pulmonary disease) (HCC)   Chronic systolic CHF (congestive heart failure) (HCC)   Paroxysmal atrial fibrillation (HCC)   Bedbug bite   Chest mass  Large bilateral inguinal hernia with pain Bilateral open inguinal hernia repair with mesh 10/28: Continue to ambulate, continue pain control, surgery following, appreciate input Per surgery okay for discharge today.  Patient did have a good bowel movement for discharge   Hypertension: He appears to be noncompliant.  Blood pressure now well-controlled, on Cardizem 360mg > switched to Coreg 12.5 twice and doing well   Cardiomyopathy, new systolic dysfunction: ?  Etiology.Last lvef 50-55%.  BNP stable volume status euvolemic.  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  Has not been taking Eliquis several days before admission -now on Coreg. Continue Eliquis on discharge   Hypokalemia: Replaced.  Mild transaminitis: Likely from EtOH, trend labs nd improving AST/ALT   Alcohol dependence: No signs of withdrawal.  Continue folate, thiamineCessation was encouraged extensively  Chronic essential tremor right worse than left at  Physician Discharge Summary  Mark [name] KZS:[id number] DOB: 04/07/1951 DOA: 05/05/2023  PCP: Hillery Aldo, NP  Admit date: 05/05/2023 Discharge date: 05/08/2023 Recommendations for Outpatient Follow-up:  Follow up with PCP in 1 weeks-call for appointment Please obtain BMP/CBC in one week Follow-up with surgery for postop care  Discharge Dispo: Home Discharge Condition: Stable Code Status:   Code Status: Full Code Diet recommendation:  Diet Order             Diet Heart Room service appropriate? Yes; Fluid consistency: Thin  Diet effective now                    Brief/Interim Summary: 72 y.o.m  w/ hx of asthma, COPD, etoh Abuse, tobacco abuse, bilateral inguinal hernia, a.fib on Eliquis, systolic CHF last echo 9/23- 50-55% lvef presented with constipation x 3 days also noticing that inguinal hernia is getting bit bigger and painful. Reports daily EtOH use but no hx of DTs has chronic essential tremmor worse in right hand.  He has not had Eliquis for 2 days. In the ED: Vitals BP 170s, afebrile,UDS- THC+, etoh <20,  ast 44, alt 36. K 3.2. CT scan >>large  large right inguinal hernia which contains the cecum, part of the ascending colon, the appendix and distal ileal small bowel loops. Cxr>>irregular opacity right upper lobe scaring infectious or inflammatory process. CT chest ordered> showed extensive scarring in the right upper lobe, no suspicious lung nodules, also showed evidence of mild bronchitis and chronic bronchitis and subacute left eighth through12th rib fracture. Patient echo showed reduction in EF to 35 to 25% G1 DD, and was having uncontrolled hypertension cardiology consulted for GDMT and cardiomyopathy. 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 Informed by  surgery today patient okay for discharge today When seen this am, the patient patient requesting for discharge as soon as possible   Discharge Diagnoses:  Principal Problem:   Inguinal hernia Active Problems:   Tremor   Transaminitis   EtOH dependence (HCC)   COPD (chronic obstructive pulmonary disease) (HCC)   Chronic systolic CHF (congestive heart failure) (HCC)   Paroxysmal atrial fibrillation (HCC)   Bedbug bite   Chest mass  Large bilateral inguinal hernia with pain Bilateral open inguinal hernia repair with mesh 10/28: Continue to ambulate, continue pain control, surgery following, appreciate input Per surgery okay for discharge today.  Patient did have a good bowel movement for discharge   Hypertension: He appears to be noncompliant.  Blood pressure now well-controlled, on Cardizem 360mg > switched to Coreg 12.5 twice and doing well   Cardiomyopathy, new systolic dysfunction: ?  Etiology.Last lvef 50-55%.  BNP stable volume status euvolemic.  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  Has not been taking Eliquis several days before admission -now on Coreg. Continue Eliquis on discharge   Hypokalemia: Replaced.  Mild transaminitis: Likely from EtOH, trend labs nd improving AST/ALT   Alcohol dependence: No signs of withdrawal.  Continue folate, thiamineCessation was encouraged extensively  Chronic essential tremor right worse than left at  159.2 lb Date of Birth:  09-15-1950       BSA:          1.972 m Patient Age:    72 years        BP:           181/101 mmHg Patient Gender: M               HR:           68 bpm. Exam Location:  Inpatient Procedure: 2D Echo, Cardiac Doppler and Color Doppler Indications:    Pre-Op  History:        Patient has prior history of Echocardiogram examinations, most                 recent 03/11/2022. Risk Factors:Hypertension.  Sonographer:    Darlys Gales Referring Phys: [id number] ANASTASSIA DOUTOVA IMPRESSIONS  1. Left ventricular ejection fraction, by estimation, is 30 to 35%. The left ventricle has moderately decreased function. The left ventricle demonstrates global hypokinesis. Left ventricular diastolic parameters are consistent with Grade I diastolic dysfunction (impaired relaxation).  2. Right ventricular systolic function is normal. The right ventricular size is normal.  3. The mitral valve is normal in structure. Trivial mitral valve regurgitation. No evidence of mitral stenosis.  4. The aortic valve is tricuspid. There is mild calcification of the aortic valve. Aortic valve regurgitation is not visualized. Aortic valve sclerosis/calcification is present, without any evidence of aortic stenosis.  5. The inferior vena cava is normal in size with greater than 50% respiratory variability, suggesting right atrial pressure of 3 mmHg. FINDINGS  Left Ventricle: Left ventricular ejection fraction, by estimation, is 30 to 35%. The left  ventricle has moderately decreased function. The left ventricle demonstrates global hypokinesis. The left ventricular internal cavity size was normal in size. There is no left ventricular hypertrophy. Left ventricular diastolic parameters are consistent with Grade I diastolic dysfunction (impaired relaxation). Right Ventricle: The right ventricular size is normal. No increase in right ventricular wall thickness. Right ventricular systolic function is normal. Left Atrium: Left atrial size was normal in size. Right Atrium: Right atrial size was normal in size. Pericardium: There is no evidence of pericardial effusion. Mitral Valve: The mitral valve is normal in structure. Trivial mitral valve regurgitation. No evidence of mitral valve stenosis. Tricuspid Valve: The tricuspid valve is normal in structure. Tricuspid valve regurgitation is not demonstrated. No evidence of tricuspid stenosis. Aortic Valve: The aortic valve is tricuspid. There is mild calcification of the aortic valve. Aortic valve regurgitation is not visualized. Aortic valve sclerosis/calcification is present, without any evidence of aortic stenosis. Aortic valve mean gradient measures 3.0 mmHg. Aortic valve peak gradient measures 5.6 mmHg. Aortic valve area, by VTI measures 2.34 cm. Pulmonic Valve: The pulmonic valve was not well visualized. Pulmonic valve regurgitation is not visualized. No evidence of pulmonic stenosis. Aorta: The aortic root is normal in size and structure. Venous: The inferior vena cava is normal in size with greater than 50% respiratory variability, suggesting right atrial pressure of 3 mmHg. IAS/Shunts: No atrial level shunt detected by color flow Doppler.  LEFT VENTRICLE PLAX 2D LVIDd:         4.60 cm   Diastology LVIDs:         3.80 cm   LV e' medial:    4.35 cm/s LV PW:         1.00 cm   LV E/e' medial:  8.9 LV IVS:  159.2 lb Date of Birth:  09-15-1950       BSA:          1.972 m Patient Age:    72 years        BP:           181/101 mmHg Patient Gender: M               HR:           68 bpm. Exam Location:  Inpatient Procedure: 2D Echo, Cardiac Doppler and Color Doppler Indications:    Pre-Op  History:        Patient has prior history of Echocardiogram examinations, most                 recent 03/11/2022. Risk Factors:Hypertension.  Sonographer:    Darlys Gales Referring Phys: [id number] ANASTASSIA DOUTOVA IMPRESSIONS  1. Left ventricular ejection fraction, by estimation, is 30 to 35%. The left ventricle has moderately decreased function. The left ventricle demonstrates global hypokinesis. Left ventricular diastolic parameters are consistent with Grade I diastolic dysfunction (impaired relaxation).  2. Right ventricular systolic function is normal. The right ventricular size is normal.  3. The mitral valve is normal in structure. Trivial mitral valve regurgitation. No evidence of mitral stenosis.  4. The aortic valve is tricuspid. There is mild calcification of the aortic valve. Aortic valve regurgitation is not visualized. Aortic valve sclerosis/calcification is present, without any evidence of aortic stenosis.  5. The inferior vena cava is normal in size with greater than 50% respiratory variability, suggesting right atrial pressure of 3 mmHg. FINDINGS  Left Ventricle: Left ventricular ejection fraction, by estimation, is 30 to 35%. The left  ventricle has moderately decreased function. The left ventricle demonstrates global hypokinesis. The left ventricular internal cavity size was normal in size. There is no left ventricular hypertrophy. Left ventricular diastolic parameters are consistent with Grade I diastolic dysfunction (impaired relaxation). Right Ventricle: The right ventricular size is normal. No increase in right ventricular wall thickness. Right ventricular systolic function is normal. Left Atrium: Left atrial size was normal in size. Right Atrium: Right atrial size was normal in size. Pericardium: There is no evidence of pericardial effusion. Mitral Valve: The mitral valve is normal in structure. Trivial mitral valve regurgitation. No evidence of mitral valve stenosis. Tricuspid Valve: The tricuspid valve is normal in structure. Tricuspid valve regurgitation is not demonstrated. No evidence of tricuspid stenosis. Aortic Valve: The aortic valve is tricuspid. There is mild calcification of the aortic valve. Aortic valve regurgitation is not visualized. Aortic valve sclerosis/calcification is present, without any evidence of aortic stenosis. Aortic valve mean gradient measures 3.0 mmHg. Aortic valve peak gradient measures 5.6 mmHg. Aortic valve area, by VTI measures 2.34 cm. Pulmonic Valve: The pulmonic valve was not well visualized. Pulmonic valve regurgitation is not visualized. No evidence of pulmonic stenosis. Aorta: The aortic root is normal in size and structure. Venous: The inferior vena cava is normal in size with greater than 50% respiratory variability, suggesting right atrial pressure of 3 mmHg. IAS/Shunts: No atrial level shunt detected by color flow Doppler.  LEFT VENTRICLE PLAX 2D LVIDd:         4.60 cm   Diastology LVIDs:         3.80 cm   LV e' medial:    4.35 cm/s LV PW:         1.00 cm   LV E/e' medial:  8.9 LV IVS:  Physician Discharge Summary  Mark [name] KZS:[id number] DOB: 04/07/1951 DOA: 05/05/2023  PCP: Hillery Aldo, NP  Admit date: 05/05/2023 Discharge date: 05/08/2023 Recommendations for Outpatient Follow-up:  Follow up with PCP in 1 weeks-call for appointment Please obtain BMP/CBC in one week Follow-up with surgery for postop care  Discharge Dispo: Home Discharge Condition: Stable Code Status:   Code Status: Full Code Diet recommendation:  Diet Order             Diet Heart Room service appropriate? Yes; Fluid consistency: Thin  Diet effective now                    Brief/Interim Summary: 72 y.o.m  w/ hx of asthma, COPD, etoh Abuse, tobacco abuse, bilateral inguinal hernia, a.fib on Eliquis, systolic CHF last echo 9/23- 50-55% lvef presented with constipation x 3 days also noticing that inguinal hernia is getting bit bigger and painful. Reports daily EtOH use but no hx of DTs has chronic essential tremmor worse in right hand.  He has not had Eliquis for 2 days. In the ED: Vitals BP 170s, afebrile,UDS- THC+, etoh <20,  ast 44, alt 36. K 3.2. CT scan >>large  large right inguinal hernia which contains the cecum, part of the ascending colon, the appendix and distal ileal small bowel loops. Cxr>>irregular opacity right upper lobe scaring infectious or inflammatory process. CT chest ordered> showed extensive scarring in the right upper lobe, no suspicious lung nodules, also showed evidence of mild bronchitis and chronic bronchitis and subacute left eighth through12th rib fracture. Patient echo showed reduction in EF to 35 to 25% G1 DD, and was having uncontrolled hypertension cardiology consulted for GDMT and cardiomyopathy. 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 Informed by  surgery today patient okay for discharge today When seen this am, the patient patient requesting for discharge as soon as possible   Discharge Diagnoses:  Principal Problem:   Inguinal hernia Active Problems:   Tremor   Transaminitis   EtOH dependence (HCC)   COPD (chronic obstructive pulmonary disease) (HCC)   Chronic systolic CHF (congestive heart failure) (HCC)   Paroxysmal atrial fibrillation (HCC)   Bedbug bite   Chest mass  Large bilateral inguinal hernia with pain Bilateral open inguinal hernia repair with mesh 10/28: Continue to ambulate, continue pain control, surgery following, appreciate input Per surgery okay for discharge today.  Patient did have a good bowel movement for discharge   Hypertension: He appears to be noncompliant.  Blood pressure now well-controlled, on Cardizem 360mg > switched to Coreg 12.5 twice and doing well   Cardiomyopathy, new systolic dysfunction: ?  Etiology.Last lvef 50-55%.  BNP stable volume status euvolemic.  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  Has not been taking Eliquis several days before admission -now on Coreg. Continue Eliquis on discharge   Hypokalemia: Replaced.  Mild transaminitis: Likely from EtOH, trend labs nd improving AST/ALT   Alcohol dependence: No signs of withdrawal.  Continue folate, thiamineCessation was encouraged extensively  Chronic essential tremor right worse than left at  Physician Discharge Summary  Mark [name] KZS:[id number] DOB: 04/07/1951 DOA: 05/05/2023  PCP: Hillery Aldo, NP  Admit date: 05/05/2023 Discharge date: 05/08/2023 Recommendations for Outpatient Follow-up:  Follow up with PCP in 1 weeks-call for appointment Please obtain BMP/CBC in one week Follow-up with surgery for postop care  Discharge Dispo: Home Discharge Condition: Stable Code Status:   Code Status: Full Code Diet recommendation:  Diet Order             Diet Heart Room service appropriate? Yes; Fluid consistency: Thin  Diet effective now                    Brief/Interim Summary: 72 y.o.m  w/ hx of asthma, COPD, etoh Abuse, tobacco abuse, bilateral inguinal hernia, a.fib on Eliquis, systolic CHF last echo 9/23- 50-55% lvef presented with constipation x 3 days also noticing that inguinal hernia is getting bit bigger and painful. Reports daily EtOH use but no hx of DTs has chronic essential tremmor worse in right hand.  He has not had Eliquis for 2 days. In the ED: Vitals BP 170s, afebrile,UDS- THC+, etoh <20,  ast 44, alt 36. K 3.2. CT scan >>large  large right inguinal hernia which contains the cecum, part of the ascending colon, the appendix and distal ileal small bowel loops. Cxr>>irregular opacity right upper lobe scaring infectious or inflammatory process. CT chest ordered> showed extensive scarring in the right upper lobe, no suspicious lung nodules, also showed evidence of mild bronchitis and chronic bronchitis and subacute left eighth through12th rib fracture. Patient echo showed reduction in EF to 35 to 25% G1 DD, and was having uncontrolled hypertension cardiology consulted for GDMT and cardiomyopathy. 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 Informed by  surgery today patient okay for discharge today When seen this am, the patient patient requesting for discharge as soon as possible   Discharge Diagnoses:  Principal Problem:   Inguinal hernia Active Problems:   Tremor   Transaminitis   EtOH dependence (HCC)   COPD (chronic obstructive pulmonary disease) (HCC)   Chronic systolic CHF (congestive heart failure) (HCC)   Paroxysmal atrial fibrillation (HCC)   Bedbug bite   Chest mass  Large bilateral inguinal hernia with pain Bilateral open inguinal hernia repair with mesh 10/28: Continue to ambulate, continue pain control, surgery following, appreciate input Per surgery okay for discharge today.  Patient did have a good bowel movement for discharge   Hypertension: He appears to be noncompliant.  Blood pressure now well-controlled, on Cardizem 360mg > switched to Coreg 12.5 twice and doing well   Cardiomyopathy, new systolic dysfunction: ?  Etiology.Last lvef 50-55%.  BNP stable volume status euvolemic.  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  Has not been taking Eliquis several days before admission -now on Coreg. Continue Eliquis on discharge   Hypokalemia: Replaced.  Mild transaminitis: Likely from EtOH, trend labs nd improving AST/ALT   Alcohol dependence: No signs of withdrawal.  Continue folate, thiamineCessation was encouraged extensively  Chronic essential tremor right worse than left at

## 2023-05-08 NOTE — TOC Transition Note (Signed)
Transition of Care [facility]) - CM/SW Discharge Note   Patient Details  Name: Mark [name] MRN: [id number] Date of Birth: 1951/02/19  [facility] CM/SW Contact:  Howell Rucks, RN Phone Number: 05/08/2023, 1:44 PM   Clinical Narrative:   DC to home order. NCM provided bus bas for transportation. No further TOC needs identified.     Final next level of care: Home/Self Care Barriers to Discharge: Barriers Resolved   Patient Goals and CMS Choice      Discharge Placement                         Discharge Plan and Services Additional resources added to the After Visit Summary for                                       Social Determinants of Health (SDOH) Interventions SDOH Screenings   Food Insecurity: No Food Insecurity (05/05/2023)  Housing: Low Risk  (05/05/2023)  Transportation Needs: No Transportation Needs (05/05/2023)  Utilities: Not At Risk (05/05/2023)  Tobacco Use: High Risk (05/06/2023)     Readmission Risk Interventions    05/07/2023   10:55 AM  Readmission Risk Prevention Plan  Transportation Screening Complete  PCP or Specialist Appt within 5-7 Days Complete  Home Care Screening Complete  Medication Review (RN CM) Complete

## 2023-05-08 NOTE — Consult Note (Addendum)
0457  BNP 278.1*    DDimer No results for input(s): "DDIMER" in the last 168 hours.   Radiology/Studies:  ECHOCARDIOGRAM COMPLETE  Result Date: 05/06/2023    ECHOCARDIOGRAM REPORT   Patient Name:   Mark [name] Date of Exam: 05/06/2023 Medical Rec #:  [id number]       Height:       74.0 in Accession #:    [id number]      Weight:       159.2 lb Date of Birth:  [birth year]       BSA:          1.972 m Patient Age:    72 years        BP:           181/101 mmHg Patient Gender: M               HR:           68 bpm. Exam Location:  Inpatient Procedure: 2D Echo, Cardiac Doppler and Color Doppler Indications:    Pre-Op  History:        Patient has  prior history of Echocardiogram examinations, most                 recent 03/11/2022. Risk Factors:Hypertension.  Sonographer:    Darlys Gales Referring Phys: [id number] Mark [name] IMPRESSIONS  1. Left ventricular ejection fraction, by estimation, is 30 to 35%. The left ventricle has moderately decreased function. The left ventricle demonstrates global hypokinesis. Left ventricular diastolic parameters are consistent with Grade I diastolic dysfunction (impaired relaxation).  2. Right ventricular systolic function is normal. The right ventricular size is normal.  3. The mitral valve is normal in structure. Trivial mitral valve regurgitation. No evidence of mitral stenosis.  4. The aortic valve is tricuspid. There is mild calcification of the aortic valve. Aortic valve regurgitation is not visualized. Aortic valve sclerosis/calcification is present, without any evidence of aortic stenosis.  5. The inferior vena cava is normal in size with greater than 50% respiratory variability, suggesting right atrial pressure of 3 mmHg. FINDINGS  Left Ventricle: Left ventricular ejection fraction, by estimation, is 30 to 35%. The left ventricle has moderately decreased function. The left ventricle demonstrates global hypokinesis. The left ventricular internal cavity size was normal in size. There is no left ventricular hypertrophy. Left ventricular diastolic parameters are consistent with Grade I diastolic dysfunction (impaired relaxation). Right Ventricle: The right ventricular size is normal. No increase in right ventricular wall thickness. Right ventricular systolic function is normal. Left Atrium: Left atrial size was normal in size. Right Atrium: Right atrial size was normal in size. Pericardium: There is no evidence of pericardial effusion. Mitral Valve: The mitral valve is normal in structure. Trivial mitral valve regurgitation. No evidence of mitral valve stenosis. Tricuspid Valve: The tricuspid valve is normal in  structure. Tricuspid valve regurgitation is not demonstrated. No evidence of tricuspid stenosis. Aortic Valve: The aortic valve is tricuspid. There is mild calcification of the aortic valve. Aortic valve regurgitation is not visualized. Aortic valve sclerosis/calcification is present, without any evidence of aortic stenosis. Aortic valve mean gradient measures 3.0 mmHg. Aortic valve peak gradient measures 5.6 mmHg. Aortic valve area, by VTI measures 2.34 cm. Pulmonic Valve: The pulmonic valve was not well visualized. Pulmonic valve regurgitation is not visualized. No evidence of pulmonic stenosis. Aorta: The aortic root is normal in size and structure. Venous: The inferior vena cava is normal in size with greater than 50% respiratory variability,  Cardiology Consultation   Patient ID: Mark [name] MRN: [id number]; DOB: 1951/05/12  Admit date: 05/05/2023 Date of Consult: 05/08/2023  PCP:  Mark Aldo, NP   [facility] Providers Cardiologist:  Jodelle Red, MD        Patient Profile:   Mark [name] is a 72 y.o. male with a hx of asthma, tobacco use, alcohol abuse, chronic tremor, bedbugs, HTN, paroxysmal atrial fibrillation, elevated troponin, chronic HFpEF who is being seen 05/08/2023 for the evaluation of medication management/abnormal echocardiogram at the request of Dr. Jonathon Bellows.  History of Present Illness:   Mr. Carroway was seen by our team in 03/2022 during complex admission for acute respiratory failure felt multifactorial in setting of PNA, asthma/COPD exacerbation, CHF exacerbation, sepsis, AKI, delirium, renal failure, abnormal TSH, anemia with folate deficiency, PAF, and elevated troponin (low/flat 100s) felt due to type 2 MI. He converted to NSR with medication during admission. He was treated with Eliquis with caution as he was felt to be a borderline candidate for long term anticoagulation as he had fallen in the past as well. He was also discharged with cardiac regimen to include Lasix, metoprolol, and diltiazem. Echo during that admission showed EF 50-55% with abnormal septal motion, enlarged RV felt driven by pulmonary issues. He did not show for his post-[name] follow-up with cardiology. He reports he has been followed by [facility] who have been prescribing his Eliquis but he is otherwise not sure what medications he has been taking. He drinks a quart of beer per day and smokes 2 cigarettes a day. UDS + THC.  He presented with complaints of constipation felt due to chronically incarcerated bilateral inguinal hernia. He was also noted to have bedbugs and is on contact precautions for this. He was hypertensive on admission. CXR raised question of chest mass. CT showed extensive scarring in  that area in the RUL, also chronic bronchitis, fatty liver, coronary and aortic atherosclerosis, and subacute fractures of the left 8th-12th ribs (seen in ED 9/22 for fall). Admitting troponin negative, BNP 278, albumin 3.1. Eliquis was held for surgery. He went to the OR for open hernia repair on 10/28. Echo was ordered on admission performed 05/06/23 showing newly reduced EF 35-40% with global hypokinesis, G1DD, normal RV, aortic sclerosis without stenosis. He has been in NSR this admission. He was on Lopressor 12.5mg  BID + diltiazem 360mg  earlier this admission, written to d/c diltiazem today and start carvedilol 12.5mg  BID. He denies any recent chest pain, palpitations, LE edema, syncope, or worsening SOB from baseline function. Reports he'd only fallen once in the last 3 months. Denies any bleeding. He is adamant that he wants to go home today and does not wish to stay in the [name] any longer for additional workup.  Past Medical History:  Diagnosis Date   Abnormal thyroid function test    Alcohol abuse    Allergic rhinitis    Asthma    BPH (benign prostatic hyperplasia)    Carpal tunnel syndrome    Chronic heart failure with preserved ejection fraction (HFpEF) (HCC)    Delirium    Hypertension    PAF (paroxysmal atrial fibrillation) (HCC)    Tobacco abuse    Type 2 MI (myocardial infarction) [name])     Past Surgical History:  Procedure Laterality Date   INGUINAL HERNIA REPAIR Bilateral 05/06/2023   Procedure: HERNIA REPAIR INGUINAL ADULT with MESH;  Surgeon: Abigail Miyamoto, MD;  Location: [facility];  Service: General;  Laterality: Bilateral;  Cardiology Consultation   Patient ID: Mark [name] MRN: [id number]; DOB: 1951/05/12  Admit date: 05/05/2023 Date of Consult: 05/08/2023  PCP:  Mark Aldo, NP   [facility] Providers Cardiologist:  Jodelle Red, MD        Patient Profile:   Mark [name] is a 72 y.o. male with a hx of asthma, tobacco use, alcohol abuse, chronic tremor, bedbugs, HTN, paroxysmal atrial fibrillation, elevated troponin, chronic HFpEF who is being seen 05/08/2023 for the evaluation of medication management/abnormal echocardiogram at the request of Dr. Jonathon Bellows.  History of Present Illness:   Mr. Carroway was seen by our team in 03/2022 during complex admission for acute respiratory failure felt multifactorial in setting of PNA, asthma/COPD exacerbation, CHF exacerbation, sepsis, AKI, delirium, renal failure, abnormal TSH, anemia with folate deficiency, PAF, and elevated troponin (low/flat 100s) felt due to type 2 MI. He converted to NSR with medication during admission. He was treated with Eliquis with caution as he was felt to be a borderline candidate for long term anticoagulation as he had fallen in the past as well. He was also discharged with cardiac regimen to include Lasix, metoprolol, and diltiazem. Echo during that admission showed EF 50-55% with abnormal septal motion, enlarged RV felt driven by pulmonary issues. He did not show for his post-[name] follow-up with cardiology. He reports he has been followed by [facility] who have been prescribing his Eliquis but he is otherwise not sure what medications he has been taking. He drinks a quart of beer per day and smokes 2 cigarettes a day. UDS + THC.  He presented with complaints of constipation felt due to chronically incarcerated bilateral inguinal hernia. He was also noted to have bedbugs and is on contact precautions for this. He was hypertensive on admission. CXR raised question of chest mass. CT showed extensive scarring in  that area in the RUL, also chronic bronchitis, fatty liver, coronary and aortic atherosclerosis, and subacute fractures of the left 8th-12th ribs (seen in ED 9/22 for fall). Admitting troponin negative, BNP 278, albumin 3.1. Eliquis was held for surgery. He went to the OR for open hernia repair on 10/28. Echo was ordered on admission performed 05/06/23 showing newly reduced EF 35-40% with global hypokinesis, G1DD, normal RV, aortic sclerosis without stenosis. He has been in NSR this admission. He was on Lopressor 12.5mg  BID + diltiazem 360mg  earlier this admission, written to d/c diltiazem today and start carvedilol 12.5mg  BID. He denies any recent chest pain, palpitations, LE edema, syncope, or worsening SOB from baseline function. Reports he'd only fallen once in the last 3 months. Denies any bleeding. He is adamant that he wants to go home today and does not wish to stay in the [name] any longer for additional workup.  Past Medical History:  Diagnosis Date   Abnormal thyroid function test    Alcohol abuse    Allergic rhinitis    Asthma    BPH (benign prostatic hyperplasia)    Carpal tunnel syndrome    Chronic heart failure with preserved ejection fraction (HFpEF) (HCC)    Delirium    Hypertension    PAF (paroxysmal atrial fibrillation) (HCC)    Tobacco abuse    Type 2 MI (myocardial infarction) [name])     Past Surgical History:  Procedure Laterality Date   INGUINAL HERNIA REPAIR Bilateral 05/06/2023   Procedure: HERNIA REPAIR INGUINAL ADULT with MESH;  Surgeon: Abigail Miyamoto, MD;  Location: [facility];  Service: General;  Laterality: Bilateral;  0457  BNP 278.1*    DDimer No results for input(s): "DDIMER" in the last 168 hours.   Radiology/Studies:  ECHOCARDIOGRAM COMPLETE  Result Date: 05/06/2023    ECHOCARDIOGRAM REPORT   Patient Name:   Mark [name] Date of Exam: 05/06/2023 Medical Rec #:  [id number]       Height:       74.0 in Accession #:    [id number]      Weight:       159.2 lb Date of Birth:  [birth year]       BSA:          1.972 m Patient Age:    72 years        BP:           181/101 mmHg Patient Gender: M               HR:           68 bpm. Exam Location:  Inpatient Procedure: 2D Echo, Cardiac Doppler and Color Doppler Indications:    Pre-Op  History:        Patient has  prior history of Echocardiogram examinations, most                 recent 03/11/2022. Risk Factors:Hypertension.  Sonographer:    Darlys Gales Referring Phys: [id number] Mark [name] IMPRESSIONS  1. Left ventricular ejection fraction, by estimation, is 30 to 35%. The left ventricle has moderately decreased function. The left ventricle demonstrates global hypokinesis. Left ventricular diastolic parameters are consistent with Grade I diastolic dysfunction (impaired relaxation).  2. Right ventricular systolic function is normal. The right ventricular size is normal.  3. The mitral valve is normal in structure. Trivial mitral valve regurgitation. No evidence of mitral stenosis.  4. The aortic valve is tricuspid. There is mild calcification of the aortic valve. Aortic valve regurgitation is not visualized. Aortic valve sclerosis/calcification is present, without any evidence of aortic stenosis.  5. The inferior vena cava is normal in size with greater than 50% respiratory variability, suggesting right atrial pressure of 3 mmHg. FINDINGS  Left Ventricle: Left ventricular ejection fraction, by estimation, is 30 to 35%. The left ventricle has moderately decreased function. The left ventricle demonstrates global hypokinesis. The left ventricular internal cavity size was normal in size. There is no left ventricular hypertrophy. Left ventricular diastolic parameters are consistent with Grade I diastolic dysfunction (impaired relaxation). Right Ventricle: The right ventricular size is normal. No increase in right ventricular wall thickness. Right ventricular systolic function is normal. Left Atrium: Left atrial size was normal in size. Right Atrium: Right atrial size was normal in size. Pericardium: There is no evidence of pericardial effusion. Mitral Valve: The mitral valve is normal in structure. Trivial mitral valve regurgitation. No evidence of mitral valve stenosis. Tricuspid Valve: The tricuspid valve is normal in  structure. Tricuspid valve regurgitation is not demonstrated. No evidence of tricuspid stenosis. Aortic Valve: The aortic valve is tricuspid. There is mild calcification of the aortic valve. Aortic valve regurgitation is not visualized. Aortic valve sclerosis/calcification is present, without any evidence of aortic stenosis. Aortic valve mean gradient measures 3.0 mmHg. Aortic valve peak gradient measures 5.6 mmHg. Aortic valve area, by VTI measures 2.34 cm. Pulmonic Valve: The pulmonic valve was not well visualized. Pulmonic valve regurgitation is not visualized. No evidence of pulmonic stenosis. Aorta: The aortic root is normal in size and structure. Venous: The inferior vena cava is normal in size with greater than 50% respiratory variability,  Cardiology Consultation   Patient ID: Mark [name] MRN: [id number]; DOB: 1951/05/12  Admit date: 05/05/2023 Date of Consult: 05/08/2023  PCP:  Mark Aldo, NP   [facility] Providers Cardiologist:  Jodelle Red, MD        Patient Profile:   Mark [name] is a 72 y.o. male with a hx of asthma, tobacco use, alcohol abuse, chronic tremor, bedbugs, HTN, paroxysmal atrial fibrillation, elevated troponin, chronic HFpEF who is being seen 05/08/2023 for the evaluation of medication management/abnormal echocardiogram at the request of Dr. Jonathon Bellows.  History of Present Illness:   Mr. Carroway was seen by our team in 03/2022 during complex admission for acute respiratory failure felt multifactorial in setting of PNA, asthma/COPD exacerbation, CHF exacerbation, sepsis, AKI, delirium, renal failure, abnormal TSH, anemia with folate deficiency, PAF, and elevated troponin (low/flat 100s) felt due to type 2 MI. He converted to NSR with medication during admission. He was treated with Eliquis with caution as he was felt to be a borderline candidate for long term anticoagulation as he had fallen in the past as well. He was also discharged with cardiac regimen to include Lasix, metoprolol, and diltiazem. Echo during that admission showed EF 50-55% with abnormal septal motion, enlarged RV felt driven by pulmonary issues. He did not show for his post-[name] follow-up with cardiology. He reports he has been followed by [facility] who have been prescribing his Eliquis but he is otherwise not sure what medications he has been taking. He drinks a quart of beer per day and smokes 2 cigarettes a day. UDS + THC.  He presented with complaints of constipation felt due to chronically incarcerated bilateral inguinal hernia. He was also noted to have bedbugs and is on contact precautions for this. He was hypertensive on admission. CXR raised question of chest mass. CT showed extensive scarring in  that area in the RUL, also chronic bronchitis, fatty liver, coronary and aortic atherosclerosis, and subacute fractures of the left 8th-12th ribs (seen in ED 9/22 for fall). Admitting troponin negative, BNP 278, albumin 3.1. Eliquis was held for surgery. He went to the OR for open hernia repair on 10/28. Echo was ordered on admission performed 05/06/23 showing newly reduced EF 35-40% with global hypokinesis, G1DD, normal RV, aortic sclerosis without stenosis. He has been in NSR this admission. He was on Lopressor 12.5mg  BID + diltiazem 360mg  earlier this admission, written to d/c diltiazem today and start carvedilol 12.5mg  BID. He denies any recent chest pain, palpitations, LE edema, syncope, or worsening SOB from baseline function. Reports he'd only fallen once in the last 3 months. Denies any bleeding. He is adamant that he wants to go home today and does not wish to stay in the [name] any longer for additional workup.  Past Medical History:  Diagnosis Date   Abnormal thyroid function test    Alcohol abuse    Allergic rhinitis    Asthma    BPH (benign prostatic hyperplasia)    Carpal tunnel syndrome    Chronic heart failure with preserved ejection fraction (HFpEF) (HCC)    Delirium    Hypertension    PAF (paroxysmal atrial fibrillation) (HCC)    Tobacco abuse    Type 2 MI (myocardial infarction) [name])     Past Surgical History:  Procedure Laterality Date   INGUINAL HERNIA REPAIR Bilateral 05/06/2023   Procedure: HERNIA REPAIR INGUINAL ADULT with MESH;  Surgeon: Abigail Miyamoto, MD;  Location: [facility];  Service: General;  Laterality: Bilateral;  0457  BNP 278.1*    DDimer No results for input(s): "DDIMER" in the last 168 hours.   Radiology/Studies:  ECHOCARDIOGRAM COMPLETE  Result Date: 05/06/2023    ECHOCARDIOGRAM REPORT   Patient Name:   Mark [name] Date of Exam: 05/06/2023 Medical Rec #:  [id number]       Height:       74.0 in Accession #:    [id number]      Weight:       159.2 lb Date of Birth:  [birth year]       BSA:          1.972 m Patient Age:    72 years        BP:           181/101 mmHg Patient Gender: M               HR:           68 bpm. Exam Location:  Inpatient Procedure: 2D Echo, Cardiac Doppler and Color Doppler Indications:    Pre-Op  History:        Patient has  prior history of Echocardiogram examinations, most                 recent 03/11/2022. Risk Factors:Hypertension.  Sonographer:    Darlys Gales Referring Phys: [id number] Mark [name] IMPRESSIONS  1. Left ventricular ejection fraction, by estimation, is 30 to 35%. The left ventricle has moderately decreased function. The left ventricle demonstrates global hypokinesis. Left ventricular diastolic parameters are consistent with Grade I diastolic dysfunction (impaired relaxation).  2. Right ventricular systolic function is normal. The right ventricular size is normal.  3. The mitral valve is normal in structure. Trivial mitral valve regurgitation. No evidence of mitral stenosis.  4. The aortic valve is tricuspid. There is mild calcification of the aortic valve. Aortic valve regurgitation is not visualized. Aortic valve sclerosis/calcification is present, without any evidence of aortic stenosis.  5. The inferior vena cava is normal in size with greater than 50% respiratory variability, suggesting right atrial pressure of 3 mmHg. FINDINGS  Left Ventricle: Left ventricular ejection fraction, by estimation, is 30 to 35%. The left ventricle has moderately decreased function. The left ventricle demonstrates global hypokinesis. The left ventricular internal cavity size was normal in size. There is no left ventricular hypertrophy. Left ventricular diastolic parameters are consistent with Grade I diastolic dysfunction (impaired relaxation). Right Ventricle: The right ventricular size is normal. No increase in right ventricular wall thickness. Right ventricular systolic function is normal. Left Atrium: Left atrial size was normal in size. Right Atrium: Right atrial size was normal in size. Pericardium: There is no evidence of pericardial effusion. Mitral Valve: The mitral valve is normal in structure. Trivial mitral valve regurgitation. No evidence of mitral valve stenosis. Tricuspid Valve: The tricuspid valve is normal in  structure. Tricuspid valve regurgitation is not demonstrated. No evidence of tricuspid stenosis. Aortic Valve: The aortic valve is tricuspid. There is mild calcification of the aortic valve. Aortic valve regurgitation is not visualized. Aortic valve sclerosis/calcification is present, without any evidence of aortic stenosis. Aortic valve mean gradient measures 3.0 mmHg. Aortic valve peak gradient measures 5.6 mmHg. Aortic valve area, by VTI measures 2.34 cm. Pulmonic Valve: The pulmonic valve was not well visualized. Pulmonic valve regurgitation is not visualized. No evidence of pulmonic stenosis. Aorta: The aortic root is normal in size and structure. Venous: The inferior vena cava is normal in size with greater than 50% respiratory variability,  0457  BNP 278.1*    DDimer No results for input(s): "DDIMER" in the last 168 hours.   Radiology/Studies:  ECHOCARDIOGRAM COMPLETE  Result Date: 05/06/2023    ECHOCARDIOGRAM REPORT   Patient Name:   Mark [name] Date of Exam: 05/06/2023 Medical Rec #:  [id number]       Height:       74.0 in Accession #:    [id number]      Weight:       159.2 lb Date of Birth:  [birth year]       BSA:          1.972 m Patient Age:    72 years        BP:           181/101 mmHg Patient Gender: M               HR:           68 bpm. Exam Location:  Inpatient Procedure: 2D Echo, Cardiac Doppler and Color Doppler Indications:    Pre-Op  History:        Patient has  prior history of Echocardiogram examinations, most                 recent 03/11/2022. Risk Factors:Hypertension.  Sonographer:    Darlys Gales Referring Phys: [id number] Mark [name] IMPRESSIONS  1. Left ventricular ejection fraction, by estimation, is 30 to 35%. The left ventricle has moderately decreased function. The left ventricle demonstrates global hypokinesis. Left ventricular diastolic parameters are consistent with Grade I diastolic dysfunction (impaired relaxation).  2. Right ventricular systolic function is normal. The right ventricular size is normal.  3. The mitral valve is normal in structure. Trivial mitral valve regurgitation. No evidence of mitral stenosis.  4. The aortic valve is tricuspid. There is mild calcification of the aortic valve. Aortic valve regurgitation is not visualized. Aortic valve sclerosis/calcification is present, without any evidence of aortic stenosis.  5. The inferior vena cava is normal in size with greater than 50% respiratory variability, suggesting right atrial pressure of 3 mmHg. FINDINGS  Left Ventricle: Left ventricular ejection fraction, by estimation, is 30 to 35%. The left ventricle has moderately decreased function. The left ventricle demonstrates global hypokinesis. The left ventricular internal cavity size was normal in size. There is no left ventricular hypertrophy. Left ventricular diastolic parameters are consistent with Grade I diastolic dysfunction (impaired relaxation). Right Ventricle: The right ventricular size is normal. No increase in right ventricular wall thickness. Right ventricular systolic function is normal. Left Atrium: Left atrial size was normal in size. Right Atrium: Right atrial size was normal in size. Pericardium: There is no evidence of pericardial effusion. Mitral Valve: The mitral valve is normal in structure. Trivial mitral valve regurgitation. No evidence of mitral valve stenosis. Tricuspid Valve: The tricuspid valve is normal in  structure. Tricuspid valve regurgitation is not demonstrated. No evidence of tricuspid stenosis. Aortic Valve: The aortic valve is tricuspid. There is mild calcification of the aortic valve. Aortic valve regurgitation is not visualized. Aortic valve sclerosis/calcification is present, without any evidence of aortic stenosis. Aortic valve mean gradient measures 3.0 mmHg. Aortic valve peak gradient measures 5.6 mmHg. Aortic valve area, by VTI measures 2.34 cm. Pulmonic Valve: The pulmonic valve was not well visualized. Pulmonic valve regurgitation is not visualized. No evidence of pulmonic stenosis. Aorta: The aortic root is normal in size and structure. Venous: The inferior vena cava is normal in size with greater than 50% respiratory variability,  Cardiology Consultation   Patient ID: Mark [name] MRN: [id number]; DOB: 1951/05/12  Admit date: 05/05/2023 Date of Consult: 05/08/2023  PCP:  Mark Aldo, NP   [facility] Providers Cardiologist:  Jodelle Red, MD        Patient Profile:   Mark [name] is a 72 y.o. male with a hx of asthma, tobacco use, alcohol abuse, chronic tremor, bedbugs, HTN, paroxysmal atrial fibrillation, elevated troponin, chronic HFpEF who is being seen 05/08/2023 for the evaluation of medication management/abnormal echocardiogram at the request of Dr. Jonathon Bellows.  History of Present Illness:   Mr. Carroway was seen by our team in 03/2022 during complex admission for acute respiratory failure felt multifactorial in setting of PNA, asthma/COPD exacerbation, CHF exacerbation, sepsis, AKI, delirium, renal failure, abnormal TSH, anemia with folate deficiency, PAF, and elevated troponin (low/flat 100s) felt due to type 2 MI. He converted to NSR with medication during admission. He was treated with Eliquis with caution as he was felt to be a borderline candidate for long term anticoagulation as he had fallen in the past as well. He was also discharged with cardiac regimen to include Lasix, metoprolol, and diltiazem. Echo during that admission showed EF 50-55% with abnormal septal motion, enlarged RV felt driven by pulmonary issues. He did not show for his post-[name] follow-up with cardiology. He reports he has been followed by [facility] who have been prescribing his Eliquis but he is otherwise not sure what medications he has been taking. He drinks a quart of beer per day and smokes 2 cigarettes a day. UDS + THC.  He presented with complaints of constipation felt due to chronically incarcerated bilateral inguinal hernia. He was also noted to have bedbugs and is on contact precautions for this. He was hypertensive on admission. CXR raised question of chest mass. CT showed extensive scarring in  that area in the RUL, also chronic bronchitis, fatty liver, coronary and aortic atherosclerosis, and subacute fractures of the left 8th-12th ribs (seen in ED 9/22 for fall). Admitting troponin negative, BNP 278, albumin 3.1. Eliquis was held for surgery. He went to the OR for open hernia repair on 10/28. Echo was ordered on admission performed 05/06/23 showing newly reduced EF 35-40% with global hypokinesis, G1DD, normal RV, aortic sclerosis without stenosis. He has been in NSR this admission. He was on Lopressor 12.5mg  BID + diltiazem 360mg  earlier this admission, written to d/c diltiazem today and start carvedilol 12.5mg  BID. He denies any recent chest pain, palpitations, LE edema, syncope, or worsening SOB from baseline function. Reports he'd only fallen once in the last 3 months. Denies any bleeding. He is adamant that he wants to go home today and does not wish to stay in the [name] any longer for additional workup.  Past Medical History:  Diagnosis Date   Abnormal thyroid function test    Alcohol abuse    Allergic rhinitis    Asthma    BPH (benign prostatic hyperplasia)    Carpal tunnel syndrome    Chronic heart failure with preserved ejection fraction (HFpEF) (HCC)    Delirium    Hypertension    PAF (paroxysmal atrial fibrillation) (HCC)    Tobacco abuse    Type 2 MI (myocardial infarction) [name])     Past Surgical History:  Procedure Laterality Date   INGUINAL HERNIA REPAIR Bilateral 05/06/2023   Procedure: HERNIA REPAIR INGUINAL ADULT with MESH;  Surgeon: Abigail Miyamoto, MD;  Location: [facility];  Service: General;  Laterality: Bilateral;

## 2023-05-08 NOTE — Plan of Care (Signed)

## 2023-05-08 NOTE — Progress Notes (Signed)
    2 Days Post-Op  Subjective: No BM yet, but lots of flatus.  Tolerating HH diet with no issues.  Minimal pain. Ready to go home.  ROS: See above, otherwise other systems negative  Objective: Vital signs in last 24 hours: Temp:  [97.9 F (36.6 C)-98.2 F (36.8 C)] 98.2 F (36.8 C) (10/30 0435) Pulse Rate:  [66-68] 68 (10/30 0435) Resp:  [18] 18 (10/30 0435) BP: (119-143)/(67-80) 119/67 (10/30 0435) SpO2:  [96 %-97 %] 97 % (10/30 0435) Last BM Date : 05/02/23  Intake/Output from previous day: 10/29 0701 - 10/30 0700 In: 600 [P.O.:600] Out: 1550 [Urine:1550] Intake/Output this shift: Total I/O In: 360 [P.O.:360] Out: -   PE: Gen: NAd Abd: soft, appropriately tender over hernia repair sites, incisions c/d/I, no ecchymosis.  Lab Results:  Recent Labs    05/06/23 0500 05/07/23 0613  WBC 4.7 12.2*  HGB 12.4* 12.6*  HCT 36.7* 37.0*  PLT 256 250   BMET Recent Labs    05/07/23 0613 05/08/23 0523  NA 134* 138  K 4.0 3.3*  CL 101 102  CO2 23 25  GLUCOSE 181* 151*  BUN 10 9  CREATININE 0.84 0.83  CALCIUM 9.0 9.1   PT/INR Recent Labs    05/05/23 2147  LABPROT 13.4  INR 1.0   CMP     Component Value Date/Time   NA 138 05/08/2023 0523   K 3.3 (L) 05/08/2023 0523   CL 102 05/08/2023 0523   CO2 25 05/08/2023 0523   GLUCOSE 151 (H) 05/08/2023 0523   BUN 9 05/08/2023 0523   CREATININE 0.83 05/08/2023 0523   CALCIUM 9.1 05/08/2023 0523   PROT 6.4 (L) 05/06/2023 0500   ALBUMIN 3.1 (L) 05/06/2023 0500   AST 44 (H) 05/06/2023 0500   ALT 36 05/06/2023 0500   ALKPHOS 91 05/06/2023 0500   BILITOT 1.1 05/06/2023 0500   GFRNONAA >60 05/08/2023 0523   GFRAA >60 11/12/2019 0737   Lipase     Component Value Date/Time   LIPASE 31 05/05/2023 1430       Studies/Results: No results found.  Anti-infectives: Anti-infectives (From admission, onward)    Start     Dose/Rate Route Frequency Ordered Stop   05/06/23 0915  ceFAZolin (ANCEF) IVPB 2g/100 mL  premix        2 g 200 mL/hr over 30 Minutes Intravenous On call to O.R. 05/06/23 0824 05/06/23 1226        Assessment/Plan POD 2, s/p open BIH repair with mesh by Dr. Magnus Ivan, 05/06/23 for Chronically incarcerated B inguinal hernias -tolerating HH diet -minimal pain -surgically doing well -ok to resume Eliquis -stable for DC home from our standpoint.  Meds sent to pharmacy and follow up arranged -d/w primary  FEN - HH diet VTE - may resume eliquis ID - none currently  Bedbugs Benign tremor COPD CHF - new HFrEF 30-35% PAF - eliquis on hold ETOH dependence  I reviewed hospitalist notes, last 24 h vitals and pain scores, last 48 h intake and output, last 24 h labs and trends, and last 24 h imaging results.   LOS: 3 days    Letha Cape , [facility] 05/08/2023, 10:48 AM Please see Amion for pager number during day hours 7:00am-4:30pm or 7:00am -11:30am on weekends

## 2023-05-09 LAB — VITAMIN B1: Vitamin B1 (Thiamine): 122.9 nmol/L (ref 66.5–200.0)

## 2023-05-20 ENCOUNTER — Ambulatory Visit (HOSPITAL_BASED_OUTPATIENT_CLINIC_OR_DEPARTMENT_OTHER): Payer: Medicare HMO | Admitting: Family

## 2023-05-28 ENCOUNTER — Ambulatory Visit (HOSPITAL_BASED_OUTPATIENT_CLINIC_OR_DEPARTMENT_OTHER): Payer: Medicare HMO | Admitting: Family

## 2023-06-21 ENCOUNTER — Other Ambulatory Visit: Payer: Self-pay

## 2023-06-21 ENCOUNTER — Encounter (HOSPITAL_COMMUNITY): Payer: Self-pay

## 2023-06-21 ENCOUNTER — Emergency Department (HOSPITAL_COMMUNITY)
Admission: EM | Admit: 2023-06-21 | Discharge: 2023-06-22 | Disposition: A | Discharge status: Home / Self Care | Payer: Medicare HMO | Attending: Emergency Medicine | Admitting: Emergency Medicine

## 2023-06-21 DIAGNOSIS — K5900 Constipation, unspecified: Secondary | ICD-10-CM | POA: Diagnosis not present

## 2023-06-21 DIAGNOSIS — E119 Type 2 diabetes mellitus without complications: Secondary | ICD-10-CM | POA: Insufficient documentation

## 2023-06-21 DIAGNOSIS — Z79899 Other long term (current) drug therapy: Secondary | ICD-10-CM | POA: Diagnosis not present

## 2023-06-21 DIAGNOSIS — I110 Hypertensive heart disease with heart failure: Secondary | ICD-10-CM | POA: Diagnosis not present

## 2023-06-21 DIAGNOSIS — I509 Heart failure, unspecified: Secondary | ICD-10-CM | POA: Diagnosis not present

## 2023-06-21 DIAGNOSIS — R1030 Lower abdominal pain, unspecified: Secondary | ICD-10-CM | POA: Diagnosis present

## 2023-06-21 NOTE — ED Provider Triage Note (Signed)
Emergency Medicine Provider Triage Evaluation Note  Mark [name] , a 72 y.o. male  was evaluated in triage.  Pt complains of constipation for the last 4 days.  He has rectal pain/pressure when he attempts to defecate.  He has not tried any OTC medications for his symptoms.   Review of Systems  Positive: As above Negative: As above  Physical Exam  There were no vitals taken for this visit. Gen:   Awake, no distress   Resp:  Normal effort  MSK:   Moves extremities without difficulty  Other:    Medical Decision Making  Medically screening exam initiated at 8:12 PM.  Appropriate orders placed.  Olen Christodoulou was informed that the remainder of the evaluation will be completed by another provider, this initial triage assessment does not replace that evaluation, and the importance of remaining in the ED until their evaluation is complete.     Lenard Simmer, PA-C 06/21/23 2012

## 2023-06-21 NOTE — ED Triage Notes (Signed)
C/o constipation with LBM:4 days ago. Pt has rectal pain and pressure when attempting to have BM. Pt hasn't tried any OTC meds.  Tenderness to abd on palpation. Pt reports blood thinner usage, denies bleeding.

## 2023-06-22 ENCOUNTER — Emergency Department (HOSPITAL_COMMUNITY): Payer: Medicare HMO

## 2023-06-22 DIAGNOSIS — K5900 Constipation, unspecified: Secondary | ICD-10-CM | POA: Diagnosis not present

## 2023-06-22 LAB — COMPREHENSIVE METABOLIC PANEL
ALT: 34 U/L (ref 0–44)
AST: 45 U/L — ABNORMAL HIGH (ref 15–41)
Albumin: 4.1 g/dL (ref 3.5–5.0)
Alkaline Phosphatase: 168 U/L — ABNORMAL HIGH (ref 38–126)
Anion gap: 10 (ref 5–15)
BUN: 8 mg/dL (ref 8–23)
CO2: 27 mmol/L (ref 22–32)
Calcium: 9.3 mg/dL (ref 8.9–10.3)
Chloride: 97 mmol/L — ABNORMAL LOW (ref 98–111)
Creatinine, Ser: 1.08 mg/dL (ref 0.61–1.24)
GFR, Estimated: >60 mL/min (ref >60)
Glucose, Bld: 135 mg/dL — ABNORMAL HIGH (ref 70–99)
Potassium: 3.9 mmol/L (ref 3.5–5.1)
Sodium: 134 mmol/L — ABNORMAL LOW (ref 135–145)
Total Bilirubin: 1.3 mg/dL — ABNORMAL HIGH (ref <1.2)
Total Protein: 8.5 g/dL — ABNORMAL HIGH (ref 6.5–8.1)

## 2023-06-22 LAB — CBC WITH DIFFERENTIAL/PLATELET
Abs Immature Granulocytes: 0.03 10*3/uL (ref 0.00–0.07)
Basophils Absolute: 0 10*3/uL (ref 0.0–0.1)
Basophils Relative: 1 %
Eosinophils Absolute: 0.1 10*3/uL (ref 0.0–0.5)
Eosinophils Relative: 2 %
HCT: 43 % (ref 39.0–52.0)
Hemoglobin: 14.8 g/dL (ref 13.0–17.0)
Immature Granulocytes: 1 %
Lymphocytes Relative: 34 %
Lymphs Abs: 2.2 10*3/uL (ref 0.7–4.0)
MCH: 34.9 pg — ABNORMAL HIGH (ref 26.0–34.0)
MCHC: 34.4 g/dL (ref 30.0–36.0)
MCV: 101.4 fL — ABNORMAL HIGH (ref 80.0–100.0)
Monocytes Absolute: 0.7 10*3/uL (ref 0.1–1.0)
Monocytes Relative: 10 %
Neutro Abs: 3.5 10*3/uL (ref 1.7–7.7)
Neutrophils Relative %: 52 %
Platelets: 284 10*3/uL (ref 150–400)
RBC: 4.24 MIL/uL (ref 4.22–5.81)
RDW: 12.9 % (ref 11.5–15.5)
WBC: 6.6 10*3/uL (ref 4.0–10.5)
nRBC: 0 % (ref 0.0–0.2)

## 2023-06-22 MED ORDER — SENNOSIDES-DOCUSATE SODIUM 8.6-50 MG PO TABS
1.0000 | ORAL_TABLET | Freq: Every evening | ORAL | 0 refills | Status: DC | PRN
Start: 2023-06-22 — End: 2024-05-21

## 2023-06-22 MED ORDER — IOHEXOL 300 MG/ML  SOLN
100.0000 mL | Freq: Once | INTRAMUSCULAR | Status: AC | PRN
  Administered 2023-06-22: 100 mL via INTRAVENOUS

## 2023-06-22 MED ORDER — MINERAL OIL RE ENEM
1.0000 | ENEMA | Freq: Once | RECTAL | Status: AC
  Administered 2023-06-22: 1 via RECTAL
  Filled 2023-06-22: qty 1

## 2023-06-22 MED ORDER — POLYETHYLENE GLYCOL 3350 17 G PO PACK: 17.0000 g | PACK | Freq: Every day | ORAL | 0 refills | Status: DC

## 2023-06-22 NOTE — ED Provider Notes (Signed)
[name] EMERGENCY DEPARTMENT AT [facility] Provider Note   CSN: [id number] Arrival date & time: 06/21/23  2004     History  Chief Complaint  Patient presents with   Constipation    Mark [name] is a 72 y.o. male.   Constipation 72 year old male history of heart failure, hypertension, atrial fibrillation, type 2 diabetes presenting for constipation.  Patient states he has not had a significant bowel movement for 4 days.  He has significant rectal pain and pressure and some lower abdominal pain when he tries to poop.  He has passed some liquid around the stool.  He has not had a blood in stool or melena.  No nausea or vomiting.  He is still passing gas some, is able to eat some.  No fevers.     Home Medications Prior to Admission medications   Medication Sig Start Date End Date Taking? Authorizing Provider  albuterol (PROVENTIL HFA;VENTOLIN HFA) 108 (90 Base) MCG/ACT inhaler Inhale 2 puffs into the lungs every 4 (four) hours as needed for wheezing or shortness of breath. 07/03/16  Yes Horton, Mayer Masker, MD  allopurinol (ZYLOPRIM) 100 MG tablet Take 50 mg by mouth daily. 05/14/23  Yes [provider]  atorvastatin (LIPITOR) 20 MG tablet Take 20 mg by mouth daily. 01/03/22  Yes [provider]  atorvastatin (LIPITOR) 40 MG tablet Take 40 mg by mouth at bedtime. 05/14/23  Yes [provider]  docusate sodium (COLACE) 100 MG capsule Take 100 mg by mouth 2 (two) times daily as needed. 05/04/23  Yes [provider]  metoprolol succinate (TOPROL-XL) 100 MG 24 hr tablet Take 100 mg by mouth daily. 05/20/23  Yes [provider]  polyethylene glycol (MIRALAX) 17 g packet Take 17 g by mouth daily. 06/22/23  Yes Durwin Glaze, MD  senna-docusate (SENOKOT-S) 8.6-50 MG tablet Take 1 tablet by mouth at bedtime as needed for moderate constipation. 06/22/23  Yes Durwin Glaze, MD  SYMBICORT 160-4.5 MCG/ACT inhaler Inhale 2 puffs into the  lungs daily.  10/14/19  Yes [provider]  acetaminophen (TYLENOL) 500 MG tablet Take 2 tablets (1,000 mg total) by mouth every 6 (six) hours as needed for mild pain (pain score 1-3) (or Fever >/= 101). 05/08/23   Barnetta Chapel, PA-C  apixaban (ELIQUIS) 5 MG TABS tablet Take 1 tablet (5 mg total) by mouth 2 (two) times daily. 03/23/22 05/06/23  Darlin Drop, DO  benzonatate (TESSALON) 100 MG capsule Take 1 capsule (100 mg total) by mouth every 8 (eight) hours. 07/23/22   Garlon Hatchet, PA-C  carvedilol (COREG) 12.5 MG tablet Take 1 tablet (12.5 mg total) by mouth 2 (two) times daily with a meal. 05/08/23 06/07/23  Lanae Boast, MD  furosemide (LASIX) 20 MG tablet Take 1 tablet (20 mg total) by mouth daily. 03/24/22 05/06/23  Darlin Drop, DO  hydrOXYzine (ATARAX) 10 MG tablet Take 10-20 mg by mouth at bedtime. 02/19/22   [provider]  losartan (COZAAR) 25 MG tablet Take 25 mg by mouth every morning. 01/31/23   [provider]  Multiple Vitamin (MULTIVITAMIN WITH MINERALS) TABS tablet Take 1 tablet by mouth daily.    [provider]  nicotine (NICODERM CQ - DOSED IN MG/24 HR) 7 mg/24hr patch Place 1 patch (7 mg total) onto the skin daily. 03/24/22   Darlin Drop, DO  omeprazole (PRILOSEC) 20 MG capsule Take 1 capsule (20 mg total) by mouth daily. Patient taking differently: Take 20  mg by mouth daily as needed (acid reflux). 10/28/19   Tegeler, Canary Brim, MD  traMADol (ULTRAM) 50 MG tablet Take 1 tablet (50 mg total) by mouth every 6 (six) hours as needed. 05/08/23 05/07/24  Barnetta Chapel, PA-C  TRELEGY ELLIPTA 200-62.5-25 MCG/ACT AEPB Inhale 1 puff into the lungs daily. 02/20/22   [provider]      Allergies    Patient has no known allergies.    Review of Systems   Review of Systems  Gastrointestinal:  Positive for constipation.  Review of systems completed and notable as per HPI.  ROS otherwise negative.   Physical Exam Updated Vital  Signs BP 129/68 (BP Location: Right Arm)   Pulse 91   Temp 98.3 F (36.8 C) (Oral)   Resp 16   Ht 6\' 2"  (1.88 m)   Wt 72 kg   SpO2 99%   BMI 20.38 kg/m  Physical Exam Vitals and nursing note reviewed. Exam conducted with a chaperone present.  Constitutional:      General: He is not in acute distress.    Appearance: He is well-developed.  HENT:     Head: Normocephalic and atraumatic.     Nose: Nose normal.     Mouth/Throat:     Mouth: Mucous membranes are moist.     Pharynx: Oropharynx is clear.  Eyes:     Extraocular Movements: Extraocular movements intact.     Conjunctiva/sclera: Conjunctivae normal.     Pupils: Pupils are equal, round, and reactive to light.  Cardiovascular:     Rate and Rhythm: Normal rate and regular rhythm.     Pulses: Normal pulses.     Heart sounds: Normal heart sounds. No murmur heard. Pulmonary:     Effort: Pulmonary effort is normal. No respiratory distress.     Breath sounds: Normal breath sounds.  Abdominal:     Palpations: Abdomen is soft.     Tenderness: There is abdominal tenderness. There is no guarding or rebound.  Genitourinary:    Comments: Significant rectal stool ball.  No bleeding.  I was able to disimpact a small amount of stool although had significant pain with this. Musculoskeletal:        General: No swelling.     Cervical back: Neck supple.     Right lower leg: No edema.     Left lower leg: No edema.  Skin:    General: Skin is warm and dry.     Capillary Refill: Capillary refill takes less than 2 seconds.  Neurological:     Mental Status: He is alert.  Psychiatric:        Mood and Affect: Mood normal.     ED Results / Procedures / Treatments   Labs (all labs ordered are listed, but only abnormal results are displayed) Labs Reviewed  CBC WITH DIFFERENTIAL/PLATELET - Abnormal; Notable for the following components:      Result Value   MCV 101.4 (*)    MCH 34.9 (*)    All other components within normal limits   COMPREHENSIVE METABOLIC PANEL - Abnormal; Notable for the following components:   Sodium 134 (*)    Chloride 97 (*)    Glucose, Bld 135 (*)    Total Protein 8.5 (*)    AST 45 (*)    Alkaline Phosphatase 168 (*)    Total Bilirubin 1.3 (*)    All other components within normal limits    EKG None  Radiology CT ABDOMEN PELVIS W CONTRAST Result  Date: 06/22/2023 CLINICAL DATA:  72 year old male with history of acute onset of nonlocalized abdominal pain. Severe constipation. EXAM: CT ABDOMEN AND PELVIS WITH CONTRAST TECHNIQUE: Multidetector CT imaging of the abdomen and pelvis was performed using the standard protocol following bolus administration of intravenous contrast. RADIATION DOSE REDUCTION: This exam was performed according to the departmental dose-optimization program which includes automated exposure control, adjustment of the mA and/or kV according to patient size and/or use of iterative reconstruction technique. CONTRAST:  OMNIPAQUE IOHEXOL 300 MG/ML  SOLN COMPARISON:  CT of the abdomen and pelvis 05/05/2023. FINDINGS: Comment: Portions of today's examination are limited by substantial patient respiratory motion. Lower chest: Unremarkable. Hepatobiliary: Severe diffuse low attenuation throughout the hepatic parenchyma, indicative of a background of hepatic steatosis. 1.7 cm centrally low-attenuation lesion with some peripheral enhancement in segment 4B (axial image 22 of series 2), similar to the prior examination, compatible with a cavernous hemangioma (no imaging follow-up recommended). No other new suspicious appearing hepatic lesions are noted. No intra or extrahepatic biliary ductal dilatation. Gallbladder is nearly completely decompressed, but otherwise unremarkable in appearance. Pancreas: No pancreatic mass. No pancreatic ductal dilatation. No pancreatic or peripancreatic fluid collections or inflammatory changes. Spleen: Unremarkable. Adrenals/Urinary Tract: Bilateral kidneys  and bilateral adrenal glands are normal in appearance. No hydroureteronephrosis. Urinary bladder is unremarkable in appearance. Stomach/Bowel: The appearance of the stomach is normal. There is no pathologic dilatation of small bowel or colon. Normal appendix. Vascular/Lymphatic: Atherosclerosis in the abdominal aorta and pelvic vasculature. No definite aneurysm or dissection noted in the abdominal or pelvic vasculature. No lymphadenopathy noted in the abdomen or pelvis. Reproductive: Prostate gland and seminal vesicles are unremarkable in appearance. Other: Compared to the prior examination, there has been interval right-sided inguinal herniorrhaphy. There is a small amount of soft tissue swelling in the right inguinal region and thickening of soft tissues in the right inguinal canal, presumably related to mild postoperative scarring and postoperative healing. Mild soft tissue stranding is also noted in the fat adjacent to the neck of the inguinal canal, likely to reflect a small amount of fat necrosis from previously manipulated hernia contents. No significant volume of ascites. No pneumoperitoneum. Musculoskeletal: There are no aggressive appearing lytic or blastic lesions noted in the visualized portions of the skeleton. IMPRESSION: 1. Postoperative changes from prior inguinal herniorrhaphy performed on 05/06/2023 with some mild soft tissue stranding in the fat adjacent to the neck of the repaired hernia, as well as some soft tissue thickening in the right inguinal canal, all of which is favored to reflect evolving postoperative scarring and inflammation. 2. No other potential acute findings are noted elsewhere in the abdomen or pelvis on today's examination. 3. Severe hepatic steatosis. 4. Aortic atherosclerosis. 5. Additional incidental findings, as above. Electronically Signed   By: Trudie Reed M.D.   On: 06/22/2023 07:10    Procedures Procedures    Medications Ordered in ED Medications  iohexol  (OMNIPAQUE) 300 MG/ML solution 100 mL (100 mLs Intravenous Contrast Given 06/22/23 0651)  mineral oil enema 1 enema (1 enema Rectal Given 06/22/23 1015)    ED Course/ Medical Decision Making/ A&P                                 Medical Decision Making Amount and/or Complexity of Data Reviewed Labs: ordered. Radiology: ordered.  Risk OTC drugs. Prescription drug management.   Medical Decision Making:   Welby Gerold is a 72  y.o. male who presented to the ED today with constipation, abdominal pain.  Vital signs reviewed.  On exam patient has mild lower abdominal tenderness.  Does not seem to be clinically obstructed.  Rectal exam with significant rectal stool ball.  I was able to disimpact him some, however he had significant pain with this.  Will obtain CT scan evaluate for possible complication, prerectal abscess, large bowel obstruction.  Will likely need enema.  Patient remained stable.  Labs and imaging are pending.  Handoff given to Dr. Rhae Hammock will plan to follow-up labs and imaging, likely perform enema and reassess.   Patient placed on continuous vitals and telemetry monitoring while in ED which was reviewed periodically.  Reviewed and confirmed nursing documentation for past medical history, family history, social history.   Patient's presentation is most consistent with acute complicated illness / injury requiring diagnostic workup.           Final Clinical Impression(s) / ED Diagnoses Final diagnoses:  Constipation, unspecified constipation type    Rx / DC Orders ED Discharge Orders          Ordered    polyethylene glycol (MIRALAX) 17 g packet  Daily        06/22/23 1159    senna-docusate (SENOKOT-S) 8.6-50 MG tablet  At bedtime PRN        06/22/23 1159              Laurence Spates, MD 06/22/23 2243

## 2023-06-22 NOTE — Discharge Instructions (Signed)
Please start taking the MiraLAX daily.  Take this right when you get home.  If you do not have a bowel movement by this evening please take the Senokot.  Please take another dose of each tomorrow if you have not had a bowel movement.  Once you do start having bowel movements stop the Senokot and continue the MiraLAX daily.  Please follow-up with your doctor for reevaluation.  If you have continued issues with this you may need to follow-up with a gastroenterologist.  Return to the ER for worsening symptoms.

## 2023-06-22 NOTE — ED Notes (Signed)
Pt was able to have BM before departing, given bus pass and shuttle taking them close to the bus stop.

## 2023-06-22 NOTE — ED Provider Notes (Signed)
72 year old male presenting to the emergency department today with complaints of constipation found to have fecal impaction.  Patient was disimpacted earlier.  Was having pain so CT scan is ordered.  Will follow-up on CT scan and plan is for enema if CT scan negative.  Physical Exam  BP (!) 152/89 (BP Location: Left Arm)   Pulse 78   Temp 97.9 F (36.6 C) (Oral)   Resp 16   Ht 6\' 2"  (1.88 m)   Wt 72 kg   SpO2 99%   BMI 20.38 kg/m   Physical Exam General: No acute distress  Procedures  Procedures  ED Course / MDM    Medical Decision Making Amount and/or Complexity of Data Reviewed Labs: ordered. Radiology: ordered.  Risk OTC drugs. Prescription drug management.   The patient's CT scans did not show any findings of obstruction.  He did have some inflammatory changes.  I did discuss this with surgery.  They reviewed this and felt that this was all to be expected postoperatively.  The patient is given a soapsuds enema as well as a mineral oil enema with out success.  I did call discuss this with GI.  They recommend starting the patient on oral therapy and that he could be discharged with outpatient follow-up.  He is discharged with return precautions on a bowel regimen.       Durwin Glaze, MD 06/22/23 [phone]

## 2023-07-24 NOTE — Progress Notes (Deleted)
Cardiology Office Note    Patient Name: Mark [name] Date of Encounter: 07/24/2023  Primary Care Provider:  Hillery Aldo, NP Primary Cardiologist:  Jodelle Red, MD Primary Electrophysiologist: None   Past Medical History    Past Medical History:  Diagnosis Date   Abnormal thyroid function test    Alcohol abuse    Allergic rhinitis    Asthma    BPH (benign prostatic hyperplasia)    Carpal tunnel syndrome    Chronic heart failure with preserved ejection fraction (HFpEF) (HCC)    Delirium    Hypertension    PAF (paroxysmal atrial fibrillation) (HCC)    Tobacco abuse    Type 2 MI (myocardial infarction) (HCC)     History of Present Illness  Mark [name] is a 73 y.o. male with a PMH of HFrEF, paroxysmal AF, tobacco abuse, EtOH abuse, HTN, asthma, bedbugs ICM, COPD, transaminitis who presents today for posthospital follow-up.  Mr. Glendening was seen initially on 05/04/2022 with severe respiratory distress secondary to sepsis from right upper lobe PNA.  He was admitted for further evaluation and developed AF with RVR and was started on Cardizem and heparin drip.  2D echo was completed showing EF of 50-55% and abnormal septal motion with enlarged RV felt driven by respiratory illness.  He was transitioned to oral Cardizem and started on Eliquis with conversion to sinus rhythm.  He completed a course of antibiotics as well as Lasix and was discharged to SNF.    He was readmitted on 05/05/2023 with abdominal swelling and constipation.  He was found to be severely hypertensive and CT scan of the abdomen was completed that showed a large right inguinal hernia and scrotal hernia the cecum and ascending colon and distal ileal bowel loops.  During admission his BNP was 278 and troponins were negative and completed a 2D echo that showed newly reduced EF of 35 as 40% with global hypokinesis and grade 1 DD with aortic sclerosis without stenosis.  He underwent surgical repair and was  seen by cardiology but declined further cardiac workup.  He was discharged with plan to continue workup discharge follow-up.  During today's visit the patient reports*** .  Patient denies chest pain, palpitations, dyspnea, PND, orthopnea, nausea, vomiting, dizziness, syncope, edema, weight gain, or early satiety.  ***Notes:-continue losartan 25 mg daily. If he comes to follow up, consider changing to entresto -continue lasix 20 mg daily -consider SLGT2i at follow up in amenable -Last ischemic evaluation: -Last echo: -Interim ED visits: Review of Systems  Please see the history of present illness.    All other systems reviewed and are otherwise negative except as noted above.  Physical Exam    Wt Readings from Last 3 Encounters:  06/21/23 158 lb 11.7 oz (72 kg)  05/06/23 159 lb 2.8 oz (72.2 kg)  03/31/23 170 lb (77.1 kg)   JY:NWGNF were no vitals filed for this visit.,There is no height or weight on file to calculate BMI. GEN: Well nourished, well developed in no acute distress Neck: No JVD; No carotid bruits Pulmonary: Clear to auscultation without rales, wheezing or rhonchi  Cardiovascular: Normal rate. Regular rhythm. Normal S1. Normal S2.   Murmurs: There is no murmur.  ABDOMEN: Soft, non-tender, non-distended EXTREMITIES:  No edema; No deformity   EKG/LABS/ Recent Cardiac Studies   ECG personally reviewed by me today - ***  Risk Assessment/Calculations:   {Does this patient have ATRIAL FIBRILLATION?[phone]}      Lab Results  Component Value Date  WBC 6.6 06/22/2023   HGB 14.8 06/22/2023   HCT 43.0 06/22/2023   MCV 101.4 (H) 06/22/2023   PLT 284 06/22/2023   Lab Results  Component Value Date   CREATININE 1.08 06/22/2023   BUN 8 06/22/2023   NA 134 (L) 06/22/2023   K 3.9 06/22/2023   CL 97 (L) 06/22/2023   CO2 27 06/22/2023   No results found for: "CHOL", "HDL", "LDLCALC", "LDLDIRECT", "TRIG", "CHOLHDL"  Lab Results  Component Value Date   HGBA1C 5.4  03/11/2022   Assessment & Plan    1.  HFpEF  2.  Paroxysmal AF  3.  Essential hypertension  4.  EtOH abuse:  5.  History of COPD:      Disposition: Follow-up with Jodelle Red, MD or APP in *** months {Are you ordering a CV Procedure (e.g. stress test, cath, DCCV, TEE, etc)?   Press F2        :[phone]}   Signed, Napoleon Form, Leodis Rains, NP 07/24/2023, 9:02 AM [facility]

## 2023-07-25 ENCOUNTER — Ambulatory Visit: Payer: Medicare HMO | Attending: Nurse Practitioner | Admitting: Nurse Practitioner

## 2023-07-25 DIAGNOSIS — F1029 Alcohol dependence with unspecified alcohol-induced disorder: Secondary | ICD-10-CM

## 2023-07-25 DIAGNOSIS — J449 Chronic obstructive pulmonary disease, unspecified: Secondary | ICD-10-CM

## 2023-07-25 DIAGNOSIS — I10 Essential (primary) hypertension: Secondary | ICD-10-CM

## 2023-07-25 DIAGNOSIS — I480 Paroxysmal atrial fibrillation: Secondary | ICD-10-CM

## 2023-07-25 DIAGNOSIS — I5032 Chronic diastolic (congestive) heart failure: Secondary | ICD-10-CM

## 2023-07-29 ENCOUNTER — Emergency Department (HOSPITAL_COMMUNITY)
Admission: EM | Admit: 2023-07-29 | Discharge: 2023-07-29 | Disposition: A | Discharge status: Home / Self Care | Payer: Medicare HMO | Attending: Emergency Medicine | Admitting: Emergency Medicine

## 2023-07-29 ENCOUNTER — Emergency Department (HOSPITAL_COMMUNITY): Payer: Medicare HMO

## 2023-07-29 ENCOUNTER — Encounter (HOSPITAL_COMMUNITY): Payer: Self-pay

## 2023-07-29 ENCOUNTER — Other Ambulatory Visit: Payer: Self-pay

## 2023-07-29 DIAGNOSIS — I110 Hypertensive heart disease with heart failure: Secondary | ICD-10-CM | POA: Diagnosis not present

## 2023-07-29 DIAGNOSIS — F10220 Alcohol dependence with intoxication, uncomplicated: Secondary | ICD-10-CM | POA: Insufficient documentation

## 2023-07-29 DIAGNOSIS — J441 Chronic obstructive pulmonary disease with (acute) exacerbation: Secondary | ICD-10-CM | POA: Diagnosis not present

## 2023-07-29 DIAGNOSIS — Y92019 Unspecified place in single-family (private) house as the place of occurrence of the external cause: Secondary | ICD-10-CM | POA: Insufficient documentation

## 2023-07-29 DIAGNOSIS — J45909 Unspecified asthma, uncomplicated: Secondary | ICD-10-CM | POA: Diagnosis not present

## 2023-07-29 DIAGNOSIS — Z20822 Contact with and (suspected) exposure to covid-19: Secondary | ICD-10-CM | POA: Diagnosis not present

## 2023-07-29 DIAGNOSIS — S0990XA Unspecified injury of head, initial encounter: Secondary | ICD-10-CM | POA: Insufficient documentation

## 2023-07-29 DIAGNOSIS — Z7901 Long term (current) use of anticoagulants: Secondary | ICD-10-CM | POA: Diagnosis not present

## 2023-07-29 DIAGNOSIS — Z79899 Other long term (current) drug therapy: Secondary | ICD-10-CM | POA: Diagnosis not present

## 2023-07-29 DIAGNOSIS — F10920 Alcohol use, unspecified with intoxication, uncomplicated: Secondary | ICD-10-CM

## 2023-07-29 DIAGNOSIS — I509 Heart failure, unspecified: Secondary | ICD-10-CM | POA: Insufficient documentation

## 2023-07-29 DIAGNOSIS — F17200 Nicotine dependence, unspecified, uncomplicated: Secondary | ICD-10-CM | POA: Diagnosis not present

## 2023-07-29 DIAGNOSIS — W19XXXA Unspecified fall, initial encounter: Secondary | ICD-10-CM | POA: Diagnosis not present

## 2023-07-29 LAB — CBC WITH DIFFERENTIAL/PLATELET
Abs Immature Granulocytes: 0.01 10*3/uL (ref 0.00–0.07)
Basophils Absolute: 0.1 10*3/uL (ref 0.0–0.1)
Basophils Relative: 1 %
Eosinophils Absolute: 0.2 10*3/uL (ref 0.0–0.5)
Eosinophils Relative: 2 %
HCT: 36.4 % — ABNORMAL LOW (ref 39.0–52.0)
Hemoglobin: 12.5 g/dL — ABNORMAL LOW (ref 13.0–17.0)
Immature Granulocytes: 0 %
Lymphocytes Relative: 60 %
Lymphs Abs: 5 10*3/uL — ABNORMAL HIGH (ref 0.7–4.0)
MCH: 34.1 pg — ABNORMAL HIGH (ref 26.0–34.0)
MCHC: 34.3 g/dL (ref 30.0–36.0)
MCV: 99.2 fL (ref 80.0–100.0)
Monocytes Absolute: 1 10*3/uL (ref 0.1–1.0)
Monocytes Relative: 13 %
Neutro Abs: 2 10*3/uL (ref 1.7–7.7)
Neutrophils Relative %: 24 %
Platelets: 288 10*3/uL (ref 150–400)
RBC: 3.67 MIL/uL — ABNORMAL LOW (ref 4.22–5.81)
RDW: 12.4 % (ref 11.5–15.5)
WBC: 8.3 10*3/uL (ref 4.0–10.5)
nRBC: 0 % (ref 0.0–0.2)

## 2023-07-29 LAB — BASIC METABOLIC PANEL
Anion gap: 13 (ref 5–15)
BUN: 11 mg/dL (ref 8–23)
CO2: 27 mmol/L (ref 22–32)
Calcium: 9.4 mg/dL (ref 8.9–10.3)
Chloride: 102 mmol/L (ref 98–111)
Creatinine, Ser: 1 mg/dL (ref 0.61–1.24)
GFR, Estimated: >60 mL/min (ref >60)
Glucose, Bld: 102 mg/dL — ABNORMAL HIGH (ref 70–99)
Potassium: 4 mmol/L (ref 3.5–5.1)
Sodium: 142 mmol/L (ref 135–145)

## 2023-07-29 LAB — RESP PANEL BY RT-PCR (RSV, FLU A&B, COVID)  RVPGX2
Influenza A by PCR: NEGATIVE
Influenza B by PCR: NEGATIVE
Resp Syncytial Virus by PCR: NEGATIVE
SARS Coronavirus 2 by RT PCR: NEGATIVE

## 2023-07-29 LAB — ETHANOL: Alcohol, Ethyl (B): 253 mg/dL — ABNORMAL HIGH (ref <10)

## 2023-07-29 MED ORDER — ALBUTEROL SULFATE HFA 108 (90 BASE) MCG/ACT IN AERS
2.0000 | INHALATION_SPRAY | Freq: Once | RESPIRATORY_TRACT | Status: AC
  Administered 2023-07-29: 2 via RESPIRATORY_TRACT
  Filled 2023-07-29: qty 6.7

## 2023-07-29 MED ORDER — PREDNISONE 20 MG PO TABS: 40.0000 mg | ORAL_TABLET | Freq: Every day | ORAL | 0 refills | Status: DC

## 2023-07-29 NOTE — ED Triage Notes (Signed)
Pt complaining of shortness of breath x a few days. Spo2 90s for ems with diminished lung sounds. Pt reports that he drank alcohol tonight and fell. Pt is on elequis. Pt denies hitting head. Ems gave albuterol, solumedrol, mag and atrovent.  Ems vitals   Spo2 99% on neb treatment  Bp 150/90 Hr 110

## 2023-07-29 NOTE — Discharge Instructions (Addendum)
You were seen today for fall and shortness of breath.  Regarding her fall, you did not sustain any serious injury; however, if you continue to drink and are on blood thinners, you risk serious injury including head bleed.  Your shortness of breath is likely related to COPD.  A course of prednisone was sent to your pharmacy.  Use your inhaler as needed.

## 2023-07-29 NOTE — ED Provider Notes (Signed)
EMERGENCY DEPARTMENT AT [facility] Provider Note   CSN: [id number] Arrival date & time: 07/29/23  0319     History  Chief Complaint  Patient presents with   Shortness of Breath    Mark [name] is a 73 y.o. male.  HPI     This is a 73 year old male who presents with shortness of breath.  Patient reports that he drank a significant amount of alcohol tonight while watching football games.  He states that he fell at home.  He is on Eliquis.  He is not sure whether he hit his head.  He developed some shortness of breath.  Per EMS he was given albuterol, Solu-Medrol, magnesium, and Atrovent.  He has not had any recent fevers or cough.  Denies any other injury.  Home Medications Prior to Admission medications   Medication Sig Start Date End Date Taking? Authorizing Provider  predniSONE (DELTASONE) 20 MG tablet Take 2 tablets (40 mg total) by mouth daily. 07/29/23  Yes Daryan Buell, Mayer Masker, MD  acetaminophen (TYLENOL) 500 MG tablet Take 2 tablets (1,000 mg total) by mouth every 6 (six) hours as needed for mild pain (pain score 1-3) (or Fever >/= 101). 05/08/23   Barnetta Chapel, PA-C  albuterol (PROVENTIL HFA;VENTOLIN HFA) 108 (90 Base) MCG/ACT inhaler Inhale 2 puffs into the lungs every 4 (four) hours as needed for wheezing or shortness of breath. 07/03/16   Jerico Grisso, Mayer Masker, MD  allopurinol (ZYLOPRIM) 100 MG tablet Take 50 mg by mouth daily. 05/14/23   [provider]  apixaban (ELIQUIS) 5 MG TABS tablet Take 1 tablet (5 mg total) by mouth 2 (two) times daily. 03/23/22 05/06/23  Darlin Drop, DO  atorvastatin (LIPITOR) 20 MG tablet Take 20 mg by mouth daily. 01/03/22   [provider]  atorvastatin (LIPITOR) 40 MG tablet Take 40 mg by mouth at bedtime. 05/14/23   [provider]  benzonatate (TESSALON) 100 MG capsule Take 1 capsule (100 mg total) by mouth every 8 (eight) hours. 07/23/22   Garlon Hatchet, PA-C  carvedilol (COREG) 12.5 MG  tablet Take 1 tablet (12.5 mg total) by mouth 2 (two) times daily with a meal. 05/08/23 06/07/23  Lanae Boast, MD  docusate sodium (COLACE) 100 MG capsule Take 100 mg by mouth 2 (two) times daily as needed. 05/04/23   [provider]  furosemide (LASIX) 20 MG tablet Take 1 tablet (20 mg total) by mouth daily. 03/24/22 05/06/23  Darlin Drop, DO  hydrOXYzine (ATARAX) 10 MG tablet Take 10-20 mg by mouth at bedtime. 02/19/22   [provider]  losartan (COZAAR) 25 MG tablet Take 25 mg by mouth every morning. 01/31/23   [provider]  metoprolol succinate (TOPROL-XL) 100 MG 24 hr tablet Take 100 mg by mouth daily. 05/20/23   [provider]  Multiple Vitamin (MULTIVITAMIN WITH MINERALS) TABS tablet Take 1 tablet by mouth daily.    [provider]  nicotine (NICODERM CQ - DOSED IN MG/24 HR) 7 mg/24hr patch Place 1 patch (7 mg total) onto the skin daily. 03/24/22   Darlin Drop, DO  omeprazole (PRILOSEC) 20 MG capsule Take 1 capsule (20 mg total) by mouth daily. Patient taking differently: Take 20 mg by mouth daily as needed (acid reflux). 10/28/19   Tegeler, Canary Brim, MD  polyethylene glycol (MIRALAX) 17 g packet Take 17 g by mouth daily. 06/22/23   Durwin Glaze, MD  senna-docusate (SENOKOT-S) 8.6-50 MG tablet Take 1 tablet  by mouth at bedtime as needed for moderate constipation. 06/22/23   Durwin Glaze, MD  SYMBICORT 160-4.5 MCG/ACT inhaler Inhale 2 puffs into the lungs daily.  10/14/19   [provider]  traMADol (ULTRAM) 50 MG tablet Take 1 tablet (50 mg total) by mouth every 6 (six) hours as needed. 05/08/23 05/07/24  Barnetta Chapel, PA-C  TRELEGY ELLIPTA 200-62.5-25 MCG/ACT AEPB Inhale 1 puff into the lungs daily. 02/20/22   [provider]      Allergies    Patient has no known allergies.    Review of Systems   Review of Systems  Constitutional:  Negative for fever.  Respiratory:  Positive for shortness of breath. Negative for  cough.   Cardiovascular:  Negative for chest pain.  Gastrointestinal:  Negative for abdominal pain, nausea and vomiting.  All other systems reviewed and are negative.   Physical Exam Updated Vital Signs BP 138/80   Pulse 96   Temp 97.6 F (36.4 C)   Resp 17   Ht 1.88 m (6\' 2" )   Wt 73.5 kg   SpO2 91%   BMI 20.80 kg/m  Physical Exam Vitals and nursing note reviewed.  Constitutional:      Appearance: He is well-developed. He is ill-appearing. He is not toxic-appearing.     Comments: Elderly, chronically ill-appearing  HENT:     Head: Normocephalic and atraumatic.  Eyes:     Pupils: Pupils are equal, round, and reactive to light.  Cardiovascular:     Rate and Rhythm: Normal rate and regular rhythm.     Heart sounds: Normal heart sounds. No murmur heard. Pulmonary:     Effort: Pulmonary effort is normal. No respiratory distress.     Breath sounds: Wheezing present.     Comments: Speaking in full sentences, wheezing in all lung fields, no significant respiratory distress Abdominal:     General: Bowel sounds are normal.     Palpations: Abdomen is soft.     Tenderness: There is no abdominal tenderness. There is no rebound.  Musculoskeletal:     Cervical back: Neck supple.  Lymphadenopathy:     Cervical: No cervical adenopathy.  Skin:    General: Skin is warm and dry.  Neurological:     Mental Status: He is alert and oriented to person, place, and time.  Psychiatric:        Mood and Affect: Mood normal.     ED Results / Procedures / Treatments   Labs (all labs ordered are listed, but only abnormal results are displayed) Labs Reviewed  CBC WITH DIFFERENTIAL/PLATELET - Abnormal; Notable for the following components:      Result Value   RBC 3.67 (*)    Hemoglobin 12.5 (*)    HCT 36.4 (*)    MCH 34.1 (*)    Lymphs Abs 5.0 (*)    All other components within normal limits  BASIC METABOLIC PANEL - Abnormal; Notable for the following components:   Glucose, Bld 102 (*)     All other components within normal limits  ETHANOL - Abnormal; Notable for the following components:   Alcohol, Ethyl (B) 253 (*)    All other components within normal limits  RESP PANEL BY RT-PCR (RSV, FLU A&B, COVID)  [id number]    EKG EKG Interpretation Date/Time:  Monday July 29 2023 04:31:38 EST Ventricular Rate:  97 PR Interval:  154 QRS Duration:  118 QT Interval:  407 QTC Calculation: 517 R Axis:   -44  Text Interpretation:  Sinus rhythm Ventricular premature complex Incomplete left bundle branch block Confirmed by Ross Marcus ([id number]) on 07/29/2023 5:06:37 AM  Radiology No results found.  Procedures Procedures    Medications Ordered in ED Medications  albuterol (VENTOLIN HFA) 108 (90 Base) MCG/ACT inhaler 2 puff (has no administration in time range)    ED Course/ Medical Decision Making/ A&P Clinical Course as of 07/29/23 0658  Mon Jul 29, 2023  0627 Clinically improved.  Repeat breath sounds with fair aeration, only occasional wheeze.  He is not on any supplemental oxygen.  He is eating and drinking and in no respiratory distress.  Inquired regarding imaging studies not having crossed over. [CH]  [phone] Radiology reads not crossing over in epic but reviewed by myself.  CT head without any acute findings.  Chest x-ray without any evidence of pneumothorax or pneumonia. [CH]  [phone] Per tech, patient ambulated and maintain pulse ox greater than 93%.  He was steady on his feet. [CH]    Clinical Course User Index [CH] Chester Sibert, Mayer Masker, MD                                 Medical Decision Making Amount and/or Complexity of Data Reviewed Labs: ordered. Radiology: ordered.  Risk Prescription drug management.   This patient presents to the ED for concern of fall, shortness of breath, this involves an extensive number of treatment options, and is a complaint that carries with it a high risk of complications and morbidity.  I considered the following differential  and admission for this acute, potentially life threatening condition.  The differential diagnosis includes acute traumatic injury such as head injury, rib fracture, COPD, pneumonia, pneumothorax  MDM:    This is a 73 year old gentleman who presents after a fall and reported shortness of breath.  He is overall nontoxic-appearing.  He has a DuoNeb going.  He has some ongoing wheezing but is in no respiratory distress.  Reports falling.  Also reports significant alcohol intake.  No obvious traumatic injuries on exam.  CT head obtained and does not show any evidence of acute bleed.  Lab work is only notable for blood alcohol level of 253.  COVID and influenza are negative.  Chest x-ray without rib fracture, pneumothorax, pneumonia.  Patient progressively clinically improved and required no further interventions including nebulizers.  He was provided with an inhaler.  He was allowed to metabolize and was able to eat.  He ambulated on his own without difficulty and maintained his pulse ox.  Presentation likely multifactorial.  Will discharge with short course prednisone for presumed COPD.  Discussed the risk of ongoing alcohol use and risk for falls and anticoagulant use.  (Labs, imaging, consults)  Labs: I Ordered, and personally interpreted labs.  The pertinent results include: CBC, BMP, EtOH  Imaging Studies ordered: I ordered imaging studies including chest x-ray, CT I independently visualized and interpreted imaging. I agree with the radiologist interpretation  Additional history obtained from chart review.  External records from outside source obtained and reviewed including prior evaluations  Cardiac Monitoring: The patient was maintained on a cardiac monitor.  If on the cardiac monitor, I personally viewed and interpreted the cardiac monitored which showed an underlying rhythm of: NS  Reevaluation: After the interventions noted above, I reevaluated the patient and found that they have  :improved  Social Determinants of Health:  lives independently  Disposition: Discharge  Co morbidities that complicate the  patient evaluation  Past Medical History:  Diagnosis Date   Abnormal thyroid function test    Alcohol abuse    Allergic rhinitis    Asthma    BPH (benign prostatic hyperplasia)    Carpal tunnel syndrome    Chronic heart failure with preserved ejection fraction (HFpEF) (HCC)    Delirium    Hypertension    PAF (paroxysmal atrial fibrillation) (HCC)    Tobacco abuse    Type 2 MI (myocardial infarction) (HCC)      Medicines Meds ordered this encounter  Medications   predniSONE (DELTASONE) 20 MG tablet    Sig: Take 2 tablets (40 mg total) by mouth daily.    Dispense:  10 tablet    Refill:  0   albuterol (VENTOLIN HFA) 108 (90 Base) MCG/ACT inhaler 2 puff    I have reviewed the patients home medicines and have made adjustments as needed  Problem List / ED Course: Problem List Items Addressed This Visit   None Visit Diagnoses       COPD exacerbation (HCC)    -  Primary   Relevant Medications   predniSONE (DELTASONE) 20 MG tablet   albuterol (VENTOLIN HFA) 108 (90 Base) MCG/ACT inhaler 2 puff (Start on 07/29/2023  7:00 AM)     Alcoholic intoxication without complication (HCC)         Fall, initial encounter                       Final Clinical Impression(s) / ED Diagnoses Final diagnoses:  COPD exacerbation (HCC)  Alcoholic intoxication without complication (HCC)  Fall, initial encounter    Rx / DC Orders ED Discharge Orders          Ordered    predniSONE (DELTASONE) 20 MG tablet  Daily        07/29/23 0655              Shon Baton, MD 07/29/23 0700

## 2023-07-29 NOTE — ED Notes (Signed)
Ambulated pt, o2started at 97%, dropped to 96% then with in 30 secs went up to 99%. After a mintute it dropped to 94% then back to 96 and maintained at 96%

## 2023-09-05 ENCOUNTER — Emergency Department (HOSPITAL_COMMUNITY)
Admission: EM | Admit: 2023-09-05 | Discharge: 2023-09-05 | Disposition: A | Discharge status: Home / Self Care | Payer: Medicare HMO | Attending: Emergency Medicine | Admitting: Emergency Medicine

## 2023-09-05 DIAGNOSIS — R112 Nausea with vomiting, unspecified: Secondary | ICD-10-CM | POA: Insufficient documentation

## 2023-09-05 LAB — CBG MONITORING, ED: Glucose-Capillary: 87 mg/dL (ref 70–99)

## 2023-09-05 MED ORDER — ONDANSETRON HCL 4 MG PO TABS: 4.0000 mg | ORAL_TABLET | Freq: Three times a day (TID) | ORAL | 0 refills | Status: DC | PRN

## 2023-09-05 NOTE — ED Notes (Signed)
 Pt was bathed, all contaminated clothing tied up in bags and placed in pt room.

## 2023-09-05 NOTE — ED Notes (Signed)
 Pt given water and a soda

## 2023-09-05 NOTE — ED Provider Notes (Signed)
 [place] EMERGENCY DEPARTMENT AT [facility] Provider Note   CSN: [id number] Arrival date & time: 09/05/23  1711     History {Add pertinent medical, surgical, social history, OB history to HPI:1} Chief Complaint  Patient presents with   Emesis    Willaim Mode is a 73 y.o. male.  He is [place] in by EMS [place] a gas station after he vomited there.  He said he thinks he ate some bad chicken.  He feels better now.  He does endorse that he has difficulty with ambulating and uses a walker to get around.  He also does endorse that he was drinking some beer.  No abdominal pain or fevers.  No diarrhea.  On arrival here there was some concern that he had bedbugs so he was taken to the decontamination room and given a shower and put in scrubs.  The history is provided by the patient.  Emesis Severity:  Mild Number of daily episodes:  1 Quality:  Stomach contents Progression:  Resolved Associated symptoms: no abdominal pain, no cough, no diarrhea and no fever   Risk factors: alcohol use and suspect food intake        Home Medications Prior to Admission medications   Medication Sig Start Date End Date Taking? Authorizing Provider  acetaminophen (TYLENOL) 500 MG tablet Take 2 tablets (1,000 mg total) by mouth every 6 (six) hours as needed for mild pain (pain score 1-3) (or Fever >/= 101). 05/08/23   Barnetta Chapel, PA-C  albuterol (PROVENTIL HFA;VENTOLIN HFA) 108 (90 Base) MCG/ACT inhaler Inhale 2 puffs into the lungs every 4 (four) hours as needed for wheezing or shortness of breath. 07/03/16   Horton, Mayer Masker, MD  allopurinol (ZYLOPRIM) 100 MG tablet Take 50 mg by mouth daily. 05/14/23   [provider]  apixaban (ELIQUIS) 5 MG TABS tablet Take 1 tablet (5 mg total) by mouth 2 (two) times daily. 03/23/22 05/06/23  Darlin Drop, DO  atorvastatin (LIPITOR) 20 MG tablet Take 20 mg by mouth daily. 01/03/22   [provider]  atorvastatin (LIPITOR) 40 MG tablet  Take 40 mg by mouth at bedtime. 05/14/23   [provider]  benzonatate (TESSALON) 100 MG capsule Take 1 capsule (100 mg total) by mouth every 8 (eight) hours. 07/23/22   Garlon Hatchet, PA-C  carvedilol (COREG) 12.5 MG tablet Take 1 tablet (12.5 mg total) by mouth 2 (two) times daily with a meal. 05/08/23 06/07/23  Lanae Boast, MD  docusate sodium (COLACE) 100 MG capsule Take 100 mg by mouth 2 (two) times daily as needed. 05/04/23   [provider]  furosemide (LASIX) 20 MG tablet Take 1 tablet (20 mg total) by mouth daily. 03/24/22 05/06/23  Darlin Drop, DO  hydrOXYzine (ATARAX) 10 MG tablet Take 10-20 mg by mouth at bedtime. 02/19/22   [provider]  losartan (COZAAR) 25 MG tablet Take 25 mg by mouth every morning. 01/31/23   [provider]  metoprolol succinate (TOPROL-XL) 100 MG 24 hr tablet Take 100 mg by mouth daily. 05/20/23   [provider]  Multiple Vitamin (MULTIVITAMIN WITH MINERALS) TABS tablet Take 1 tablet by mouth daily.    [provider]  nicotine (NICODERM CQ - DOSED IN MG/24 HR) 7 mg/24hr patch Place 1 patch (7 mg total) onto the skin daily. 03/24/22   Darlin Drop, DO  omeprazole (PRILOSEC) 20 MG capsule Take 1 capsule (20 mg total) by mouth daily. Patient taking differently: Take  20 mg by mouth daily as needed (acid reflux). 10/28/19   Tegeler, Canary Brim, MD  polyethylene glycol (MIRALAX) 17 g packet Take 17 g by mouth daily. 06/22/23   Durwin Glaze, MD  predniSONE (DELTASONE) 20 MG tablet Take 2 tablets (40 mg total) by mouth daily. 07/29/23   Horton, Mayer Masker, MD  senna-docusate (SENOKOT-S) 8.6-50 MG tablet Take 1 tablet by mouth at bedtime as needed for moderate constipation. 06/22/23   Durwin Glaze, MD  SYMBICORT 160-4.5 MCG/ACT inhaler Inhale 2 puffs into the lungs daily.  10/14/19   [provider]  traMADol (ULTRAM) 50 MG tablet Take 1 tablet (50 mg total) by mouth every 6 (six) hours as needed.  05/08/23 05/07/24  Barnetta Chapel, PA-C  TRELEGY ELLIPTA 200-62.5-25 MCG/ACT AEPB Inhale 1 puff into the lungs daily. 02/20/22   [provider]      Allergies    Patient has no known allergies.    Review of Systems   Review of Systems  Constitutional:  Negative for fever.  Respiratory:  Negative for cough.   Gastrointestinal:  Positive for vomiting. Negative for abdominal pain and diarrhea.    Physical Exam Updated Vital Signs BP 139/88   Pulse 90   Temp 97.6 F (36.4 C) (Oral)   Resp 18   Ht 6\' 2"  (1.88 m)   Wt 74.8 kg   SpO2 96%   BMI 21.18 kg/m  Physical Exam Vitals and nursing note reviewed.  Constitutional:      General: He is not in acute distress.    Appearance: Normal appearance. He is well-developed.  HENT:     Head: Normocephalic and atraumatic.  Eyes:     Conjunctiva/sclera: Conjunctivae normal.  Cardiovascular:     Rate and Rhythm: Normal rate and regular rhythm.     Heart sounds: No murmur heard. Pulmonary:     Effort: Pulmonary effort is normal. No respiratory distress.     Breath sounds: Normal breath sounds.  Abdominal:     Palpations: Abdomen is soft.     Tenderness: There is no abdominal tenderness. There is no guarding or rebound.  Musculoskeletal:        General: No swelling.     Cervical back: Neck supple.  Skin:    General: Skin is warm and dry.     Capillary Refill: Capillary refill takes less than 2 seconds.  Neurological:     General: No focal deficit present.     Mental Status: He is alert.     ED Results / Procedures / Treatments   Labs (all labs ordered are listed, but only abnormal results are displayed) Labs Reviewed - No data to display  EKG None  Radiology No results found.  Procedures Procedures  {Document cardiac monitor, telemetry assessment procedure when appropriate:1}  Medications Ordered in ED Medications - No data to display  ED Course/ Medical Decision Making/ A&P   {   Click here for ABCD2,  HEART and other calculatorsREFRESH Note before signing :1}                              Medical Decision Making  This patient complains of ***; this involves an extensive number of treatment Options and is a complaint that carries with it a high risk of complications and morbidity. The differential includes ***  I ordered, reviewed and interpreted labs, which included *** I ordered medication *** and reviewed PMP when  indicated. I ordered imaging studies which included *** and I independently    visualized and interpreted imaging which showed *** Additional history obtained from *** Previous records obtained and reviewed *** I consulted *** and discussed lab and imaging findings and discussed disposition.  Cardiac monitoring reviewed, *** Social determinants considered, *** Critical Interventions: ***  After the interventions stated above, I reevaluated the patient and found *** Admission and further testing considered, ***   {Document critical care time when appropriate:1} {Document review of labs and clinical decision tools ie heart score, Chads2Vasc2 etc:1}  {Document your independent review of radiology images, and any outside records:1} {Document your discussion with family members, caretakers, and with consultants:1} {Document social determinants of health affecting pt's care:1} {Document your decision making why or why not admission, treatments were needed:1} Final Clinical Impression(s) / ED Diagnoses Final diagnoses:  None    Rx / DC Orders ED Discharge Orders     None

## 2023-09-05 NOTE — ED Triage Notes (Signed)
 Pt [place] via [facility] from the bus stop. Pt was reportedly out drinking beer and eating chicken when he started having episodes of emesis. EMS also reports that he knows he has bedbugs at home, and EMS reports seeing several. One caught during triage and placed in a cup for later proof.

## 2023-09-23 ENCOUNTER — Emergency Department (HOSPITAL_COMMUNITY)

## 2023-09-23 ENCOUNTER — Encounter (HOSPITAL_COMMUNITY): Payer: Self-pay

## 2023-09-23 ENCOUNTER — Other Ambulatory Visit: Payer: Self-pay

## 2023-09-23 ENCOUNTER — Emergency Department (HOSPITAL_COMMUNITY)
Admission: EM | Admit: 2023-09-23 | Discharge: 2023-09-24 | Disposition: A | Discharge status: Home / Self Care | Attending: Student | Admitting: Student

## 2023-09-23 DIAGNOSIS — I480 Paroxysmal atrial fibrillation: Secondary | ICD-10-CM | POA: Diagnosis not present

## 2023-09-23 DIAGNOSIS — M47812 Spondylosis without myelopathy or radiculopathy, cervical region: Secondary | ICD-10-CM | POA: Insufficient documentation

## 2023-09-23 DIAGNOSIS — W01198A Fall on same level from slipping, tripping and stumbling with subsequent striking against other object, initial encounter: Secondary | ICD-10-CM | POA: Insufficient documentation

## 2023-09-23 DIAGNOSIS — Y92524 Gas station as the place of occurrence of the external cause: Secondary | ICD-10-CM | POA: Diagnosis not present

## 2023-09-23 DIAGNOSIS — S0990XA Unspecified injury of head, initial encounter: Secondary | ICD-10-CM | POA: Insufficient documentation

## 2023-09-23 DIAGNOSIS — F17210 Nicotine dependence, cigarettes, uncomplicated: Secondary | ICD-10-CM | POA: Insufficient documentation

## 2023-09-23 DIAGNOSIS — Z7901 Long term (current) use of anticoagulants: Secondary | ICD-10-CM | POA: Diagnosis not present

## 2023-09-23 DIAGNOSIS — R251 Tremor, unspecified: Secondary | ICD-10-CM | POA: Insufficient documentation

## 2023-09-23 DIAGNOSIS — J45909 Unspecified asthma, uncomplicated: Secondary | ICD-10-CM | POA: Insufficient documentation

## 2023-09-23 DIAGNOSIS — W19XXXA Unspecified fall, initial encounter: Secondary | ICD-10-CM

## 2023-09-23 DIAGNOSIS — I110 Hypertensive heart disease with heart failure: Secondary | ICD-10-CM | POA: Insufficient documentation

## 2023-09-23 DIAGNOSIS — E119 Type 2 diabetes mellitus without complications: Secondary | ICD-10-CM | POA: Insufficient documentation

## 2023-09-23 DIAGNOSIS — I5022 Chronic systolic (congestive) heart failure: Secondary | ICD-10-CM | POA: Insufficient documentation

## 2023-09-23 DIAGNOSIS — I6782 Cerebral ischemia: Secondary | ICD-10-CM | POA: Insufficient documentation

## 2023-09-23 DIAGNOSIS — J449 Chronic obstructive pulmonary disease, unspecified: Secondary | ICD-10-CM | POA: Insufficient documentation

## 2023-09-23 DIAGNOSIS — I4891 Unspecified atrial fibrillation: Secondary | ICD-10-CM | POA: Insufficient documentation

## 2023-09-23 DIAGNOSIS — Z7951 Long term (current) use of inhaled steroids: Secondary | ICD-10-CM | POA: Insufficient documentation

## 2023-09-23 DIAGNOSIS — Z79899 Other long term (current) drug therapy: Secondary | ICD-10-CM | POA: Insufficient documentation

## 2023-09-23 LAB — CBC WITH DIFFERENTIAL/PLATELET
Abs Immature Granulocytes: 0.01 10*3/uL (ref 0.00–0.07)
Basophils Absolute: 0 10*3/uL (ref 0.0–0.1)
Basophils Relative: 0 %
Eosinophils Absolute: 0 10*3/uL (ref 0.0–0.5)
Eosinophils Relative: 0 %
HCT: 46.9 % (ref 39.0–52.0)
Hemoglobin: 15.8 g/dL (ref 13.0–17.0)
Immature Granulocytes: 0 %
Lymphocytes Relative: 17 %
Lymphs Abs: 1.1 10*3/uL (ref 0.7–4.0)
MCH: 33.1 pg (ref 26.0–34.0)
MCHC: 33.7 g/dL (ref 30.0–36.0)
MCV: 98.1 fL (ref 80.0–100.0)
Monocytes Absolute: 0.6 10*3/uL (ref 0.1–1.0)
Monocytes Relative: 9 %
Neutro Abs: 4.8 10*3/uL (ref 1.7–7.7)
Neutrophils Relative %: 74 %
Platelets: 245 10*3/uL (ref 150–400)
RBC: 4.78 MIL/uL (ref 4.22–5.81)
RDW: 12.1 % (ref 11.5–15.5)
WBC: 6.5 10*3/uL (ref 4.0–10.5)
nRBC: 0 % (ref 0.0–0.2)

## 2023-09-23 LAB — COMPREHENSIVE METABOLIC PANEL
ALT: 31 U/L (ref 0–44)
AST: 39 U/L (ref 15–41)
Albumin: 3.8 g/dL (ref 3.5–5.0)
Alkaline Phosphatase: 87 U/L (ref 38–126)
Anion gap: 12 (ref 5–15)
BUN: 11 mg/dL (ref 8–23)
CO2: 27 mmol/L (ref 22–32)
Calcium: 9.5 mg/dL (ref 8.9–10.3)
Chloride: 95 mmol/L — ABNORMAL LOW (ref 98–111)
Creatinine, Ser: 1.5 mg/dL — ABNORMAL HIGH (ref 0.61–1.24)
GFR, Estimated: 50 mL/min — ABNORMAL LOW (ref >60)
Glucose, Bld: 95 mg/dL (ref 70–99)
Potassium: 4 mmol/L (ref 3.5–5.1)
Sodium: 134 mmol/L — ABNORMAL LOW (ref 135–145)
Total Bilirubin: 2 mg/dL — ABNORMAL HIGH (ref 0.0–1.2)
Total Protein: 8.1 g/dL (ref 6.5–8.1)

## 2023-09-23 LAB — RESP PANEL BY RT-PCR (RSV, FLU A&B, COVID)  RVPGX2
Influenza A by PCR: NEGATIVE
Influenza B by PCR: NEGATIVE
Resp Syncytial Virus by PCR: NEGATIVE
SARS Coronavirus 2 by RT PCR: NEGATIVE

## 2023-09-23 NOTE — ED Triage Notes (Signed)
 Patient BIB [facility] from [facility] [place] after fall where he reports he hit his head and is on eliquis. EMS reports no obvious injury, patient reports alcohol intake today of 2 large beers.GCS 15, A&Ox4, patient was being escorted out by undomiciled upon EMS arrival.

## 2023-09-23 NOTE — ED Provider Notes (Signed)
  Provider Note MRN:  [id number]  Arrival date & time: 09/24/23    ED Course and Medical Decision Making  Assumed care of patient at sign-out or upon transfer.  Fall, anticoagulated, possible near syncopal episode in the setting of alcohol use, awaiting CT head, basic labs, anticipating discharge.  Procedures  Final Clinical Impressions(s) / ED Diagnoses     ICD-10-CM   1. Fall, initial encounter  W19.[facility]       ED Discharge Orders     None         Discharge Instructions      You were evaluated in the Emergency Department and after careful evaluation, we did not find any emergent condition requiring admission or further testing in the hospital.  Your exam/testing today was overall reassuring.  Please return to the Emergency Department if you experience any worsening of your condition.  Thank you for allowing [place] to be a part of your care.       Elmer Sow. Pilar Plate, MD [facility] Emergency Medicine [facility] [email or web address]    Sabas Sous, MD 09/24/23 [place] Bruns

## 2023-09-24 NOTE — Discharge Instructions (Signed)
You were evaluated in the Emergency Department and after careful evaluation, we did not find any emergent condition requiring admission or further testing in the hospital.  Your exam/testing today was overall reassuring.  Please return to the Emergency Department if you experience any worsening of your condition.  Thank you for allowing us to be a part of your care.  

## 2023-09-24 NOTE — ED Provider Notes (Signed)
 [place] EMERGENCY DEPARTMENT AT [facility] Provider Note  CSN: [id number] Arrival date & time: 09/23/23 2104  Chief Complaint(s) Fall  HPI Mark [name] is a 73 y.o. male with PMH paroxysmal A-fib on Eliquis, chronic tremor, alcohol use, BPH who presents emergency room for evaluation of a fall.  Patient states that he went to use the "game machine" at a gas station, got dizzy fell forward and hit his head.  Does state that he drank 2 large beers today.  Currently denies numbness, tingling, weakness, chest pain, shortness of breath or other systemic, neurologic or traumatic complaints.  No loss of consciousness today.   Past Medical History Past Medical History:  Diagnosis Date   Abnormal thyroid function test    Alcohol abuse    Allergic rhinitis    Asthma    BPH (benign prostatic hyperplasia)    Carpal tunnel syndrome    Chronic heart failure with preserved ejection fraction (HFpEF) (HCC)    Delirium    Hypertension    PAF (paroxysmal atrial fibrillation) (HCC)    Tobacco abuse    Type 2 MI (myocardial infarction) [facility])    Patient Active Problem List   Diagnosis Date Noted   Inguinal hernia 05/05/2023   Chronic systolic CHF (congestive heart failure) (HCC) 05/05/2023   Paroxysmal atrial fibrillation (HCC) 05/05/2023   Bedbug bite 05/05/2023   Chest mass 05/05/2023   COPD (chronic obstructive pulmonary disease) (HCC)    Hypervolemia    Atrial fibrillation with RVR (HCC) 03/10/2022   Acute respiratory failure with hypoxia (HCC) 03/09/2022   Sepsis due to pneumonia (HCC) 03/09/2022   Elevated troponin 03/09/2022   EtOH dependence (HCC) 03/09/2022   Tobacco abuse 03/09/2022   PNA (pneumonia) 03/09/2022   ARF (acute renal failure) (HCC) 03/09/2022   Dehydration 03/09/2022   Abnormal TSH 03/09/2022   Chronic alcohol abuse    Severe persistent asthma with acute exacerbation    AKI (acute kidney injury) (HCC)    Severe sepsis (HCC)    Asthma exacerbation  11/10/2019   Tremor 11/10/2019   Alcohol abuse with intoxication (HCC) 11/10/2019   Hypertensive urgency 11/10/2019   Transaminitis 11/10/2019   Home Medication(s) Prior to Admission medications   Medication Sig Start Date End Date Taking? Authorizing Provider  acetaminophen (TYLENOL) 500 MG tablet Take 2 tablets (1,000 mg total) by mouth every 6 (six) hours as needed for mild pain (pain score 1-3) (or Fever >/= 101). 05/08/23   Barnetta Chapel, PA-C  albuterol (PROVENTIL HFA;VENTOLIN HFA) 108 (90 Base) MCG/ACT inhaler Inhale 2 puffs into the lungs every 4 (four) hours as needed for wheezing or shortness of breath. 07/03/16   Horton, Mayer Masker, MD  allopurinol (ZYLOPRIM) 100 MG tablet Take 50 mg by mouth daily. 05/14/23   [provider]  apixaban (ELIQUIS) 5 MG TABS tablet Take 1 tablet (5 mg total) by mouth 2 (two) times daily. 03/23/22 05/06/23  Darlin Drop, DO  atorvastatin (LIPITOR) 20 MG tablet Take 20 mg by mouth daily. 01/03/22   [provider]  atorvastatin (LIPITOR) 40 MG tablet Take 40 mg by mouth at bedtime. 05/14/23   [provider]  benzonatate (TESSALON) 100 MG capsule Take 1 capsule (100 mg total) by mouth every 8 (eight) hours. 07/23/22   Garlon Hatchet, PA-C  carvedilol (COREG) 12.5 MG tablet Take 1 tablet (12.5 mg total) by mouth 2 (two) times daily with a meal. 05/08/23 06/07/23  Lanae Boast, MD  docusate sodium (COLACE) 100 MG  capsule Take 100 mg by mouth 2 (two) times daily as needed. 05/04/23   [provider]  furosemide (LASIX) 20 MG tablet Take 1 tablet (20 mg total) by mouth daily. 03/24/22 05/06/23  Darlin Drop, DO  hydrOXYzine (ATARAX) 10 MG tablet Take 10-20 mg by mouth at bedtime. 02/19/22   [provider]  losartan (COZAAR) 25 MG tablet Take 25 mg by mouth every morning. 01/31/23   [provider]  metoprolol succinate (TOPROL-XL) 100 MG 24 hr tablet Take 100 mg by mouth daily. 05/20/23   [provider]  Multiple Vitamin (MULTIVITAMIN WITH MINERALS) TABS tablet Take 1 tablet by mouth daily.    [provider]  nicotine (NICODERM CQ - DOSED IN MG/24 HR) 7 mg/24hr patch Place 1 patch (7 mg total) onto the skin daily. 03/24/22   Darlin Drop, DO  omeprazole (PRILOSEC) 20 MG capsule Take 1 capsule (20 mg total) by mouth daily. Patient taking differently: Take 20 mg by mouth daily as needed (acid reflux). 10/28/19   Tegeler, Canary Brim, MD  ondansetron (ZOFRAN) 4 MG tablet Take 1 tablet (4 mg total) by mouth every 8 (eight) hours as needed for nausea or vomiting. 09/05/23   Terrilee Files, MD  polyethylene glycol (MIRALAX) 17 g packet Take 17 g by mouth daily. 06/22/23   Durwin Glaze, MD  predniSONE (DELTASONE) 20 MG tablet Take 2 tablets (40 mg total) by mouth daily. 07/29/23   Horton, Mayer Masker, MD  senna-docusate (SENOKOT-S) 8.6-50 MG tablet Take 1 tablet by mouth at bedtime as needed for moderate constipation. 06/22/23   Durwin Glaze, MD  SYMBICORT 160-4.5 MCG/ACT inhaler Inhale 2 puffs into the lungs daily.  10/14/19   [provider]  traMADol (ULTRAM) 50 MG tablet Take 1 tablet (50 mg total) by mouth every 6 (six) hours as needed. 05/08/23 05/07/24  Barnetta Chapel, PA-C  TRELEGY ELLIPTA 200-62.5-25 MCG/ACT AEPB Inhale 1 puff into the lungs daily. 02/20/22   [provider]                                                                                                                                    Past Surgical History Past Surgical History:  Procedure Laterality Date   INGUINAL HERNIA REPAIR Bilateral 05/06/2023   Procedure: HERNIA REPAIR INGUINAL ADULT with MESH;  Surgeon: Abigail Miyamoto, MD;  Location: [facility];  Service: General;  Laterality: Bilateral;   Family History Family History  Problem Relation Age of Onset   Colon cancer Father     Social History Social History   Tobacco Use   Smoking status: Every Day    Current packs/day: 0.10     Types: Cigarettes   Smokeless tobacco: Never  Substance Use Topics   Alcohol use: Yes    Comment: 2 beers daily   Drug use: No   Allergies Patient has no known allergies.  Review of Systems Review of Systems  Neurological:  Positive for tremors.    Physical Exam Vital Signs  I have reviewed the triage vital signs BP (!) 165/102   Pulse 83   Temp 97.8 F (36.6 C)   Resp 15   SpO2 100%   Physical Exam Constitutional:      General: He is not in acute distress.    Appearance: Normal appearance.  HENT:     Head: Normocephalic and atraumatic.     Nose: No congestion or rhinorrhea.  Eyes:     General:        Right eye: No discharge.        Left eye: No discharge.     Extraocular Movements: Extraocular movements intact.     Pupils: Pupils are equal, round, and reactive to light.  Cardiovascular:     Rate and Rhythm: Normal rate and regular rhythm.     Heart sounds: No murmur heard. Pulmonary:     Effort: No respiratory distress.     Breath sounds: No wheezing or rales.  Abdominal:     General: There is no distension.     Tenderness: There is no abdominal tenderness.  Musculoskeletal:        General: Normal range of motion.     Cervical back: Normal range of motion.  Skin:    General: Skin is warm and dry.  Neurological:     General: No focal deficit present.     Mental Status: He is alert.     Cranial Nerves: No cranial nerve deficit.     Sensory: No sensory deficit.     Motor: No weakness.     Comments: Tremor     ED Results and Treatments Labs (all labs ordered are listed, but only abnormal results are displayed) Labs Reviewed  COMPREHENSIVE METABOLIC PANEL - Abnormal; Notable for the following components:      Result Value   Sodium 134 (*)    Chloride 95 (*)    Creatinine, Ser 1.50 (*)    Total Bilirubin 2.0 (*)    GFR, Estimated 50 (*)    All other components within normal limits  RESP PANEL BY RT-PCR (RSV, FLU A&B, COVID)  RVPGX2  CBC WITH  DIFFERENTIAL/PLATELET                                                                                                                          Radiology CT CERVICAL SPINE WO CONTRAST Result Date: 09/23/2023 CLINICAL DATA:  Larey Seat, neck trauma EXAM: CT CERVICAL SPINE WITHOUT CONTRAST TECHNIQUE: Multidetector CT imaging of the cervical spine was performed without intravenous contrast. Multiplanar CT image reconstructions were also generated. RADIATION DOSE REDUCTION: This exam was performed according to the departmental dose-optimization program which includes automated exposure control, adjustment of the mA and/or kV according to patient size and/or use of iterative reconstruction technique. COMPARISON:  07/22/2022 FINDINGS: Alignment: Stable oversew of cervical lordosis with kyphosis centered at the C3-4  level. Otherwise alignment is anatomic. Skull base and vertebrae: No acute fracture. No primary bone lesion or focal pathologic process. Soft tissues and spinal canal: No prevertebral fluid or swelling. No visible canal hematoma. Disc levels: Stable moderate multilevel spondylosis greatest from C3-4 through C5-6. Stable mild diffuse facet hypertrophy. Upper chest: Airway is patent.  Chronic scarring at the right apex. Other: Reconstructed images demonstrate no additional findings. IMPRESSION: 1. No acute cervical spine fracture. 2. Stable multilevel cervical spondylosis and facet hypertrophy. Electronically Signed   By: Sharlet Salina M.D.   On: 09/23/2023 23:18   CT HEAD WO CONTRAST ( ) Result Date: 09/23/2023 CLINICAL DATA:  Head trauma EXAM: CT HEAD WITHOUT CONTRAST TECHNIQUE: Contiguous axial images were obtained from the base of the skull through the vertex without intravenous contrast. RADIATION DOSE REDUCTION: This exam was performed according to the departmental dose-optimization program which includes automated exposure control, adjustment of the mA and/or kV according to patient size and/or use of  iterative reconstruction technique. COMPARISON:  07/29/2023 FINDINGS: Brain: Stable chronic left frontal cortical infarct and chronic small vessel ischemic changes within the periventricular white matter. No evidence of acute infarct or hemorrhage. Lateral ventricles and midline structures are unremarkable. No acute extra-axial fluid collections. No mass effect. Vascular: No hyperdense vessel or unexpected calcification. Skull: Normal. Negative for fracture or focal lesion. Sinuses/Orbits: No acute finding. Other: None. IMPRESSION: 1. No acute intracranial process. 2. Stable chronic ischemic changes within the left frontal cortex and periventricular white matter. Electronically Signed   By: Sharlet Salina M.D.   On: 09/23/2023 23:16    Pertinent labs & imaging results that were available during my care of the patient were reviewed by me and considered in my medical decision making (see MDM for details).  Medications Ordered in ED Medications - No data to display                                                                                                                                   Procedures Procedures  (including critical care time)  Medical Decision Making / ED Course   This patient presents to the ED for concern of fall on blood thinners, this involves an extensive number of treatment options, and is a complaint that carries with it a high risk of complications and morbidity.  The differential diagnosis includes fracture, contusion, hematoma, ligamentous injury, closed head injury, ICH, laceration, intrathoracic injury, intra-abdominal injury  MDM: Patient seen emergency room for evaluation of a fall.  Physical exam is unremarkable outside of a baseline tremor.  Trauma activation not pursued given clinical status is very well-appearing and no evidence of external head trauma but I did order expedited head CT and CT imaging of the C-spine.  Pending laboratory evaluation and imaging  studies at time of signout.  Please see provider signout note for attenuation of workup.   Additional history obtained:  -External records from outside source obtained  and reviewed including: Chart review including previous notes, labs, imaging, consultation notes   Lab Tests: -I ordered, reviewed, and interpreted labs.   The pertinent results include:   Labs Reviewed  COMPREHENSIVE METABOLIC PANEL - Abnormal; Notable for the following components:      Result Value   Sodium 134 (*)    Chloride 95 (*)    Creatinine, Ser 1.50 (*)    Total Bilirubin 2.0 (*)    GFR, Estimated 50 (*)    All other components within normal limits  RESP PANEL BY RT-PCR (RSV, FLU A&B, COVID)  RVPGX2  CBC WITH DIFFERENTIAL/PLATELET      Imaging Studies ordered: I ordered imaging studies including CT head, C-spine, chest x-ray, pelvis x-ray and these are pending  Medicines ordered and prescription drug management: No orders of the defined types were placed in this encounter.   -I have reviewed the patients home medicines and have made adjustments as needed  Critical interventions none    Social Determinants of Health:  Factors impacting patients care include: Homeless   Reevaluation: After the interventions noted above, I reevaluated the patient and found that they have :stayed the same  Co morbidities that complicate the patient evaluation  Past Medical History:  Diagnosis Date   Abnormal thyroid function test    Alcohol abuse    Allergic rhinitis    Asthma    BPH (benign prostatic hyperplasia)    Carpal tunnel syndrome    Chronic heart failure with preserved ejection fraction (HFpEF) (HCC)    Delirium    Hypertension    PAF (paroxysmal atrial fibrillation) (HCC)    Tobacco abuse    Type 2 MI (myocardial infarction) (HCC)       Dispostion: I considered admission for this patient, and patient pending laboratory evaluation and imaging studies at time of signout.  Please see  provider signout for continuation of workup.     Final Clinical Impression(s) / ED Diagnoses Final diagnoses:  Fall, initial encounter     @PCDICTATION @    Rainier Feuerborn, Wyn Forster, MD 09/24/23 [phone]

## 2023-10-19 ENCOUNTER — Emergency Department (HOSPITAL_COMMUNITY)
Admission: EM | Admit: 2023-10-19 | Discharge: 2023-10-19 | Disposition: A | Discharge status: Home / Self Care | Attending: Emergency Medicine | Admitting: Emergency Medicine

## 2023-10-19 ENCOUNTER — Encounter (HOSPITAL_COMMUNITY): Payer: Self-pay | Admitting: Emergency Medicine

## 2023-10-19 ENCOUNTER — Emergency Department (HOSPITAL_COMMUNITY)

## 2023-10-19 DIAGNOSIS — R251 Tremor, unspecified: Secondary | ICD-10-CM | POA: Diagnosis not present

## 2023-10-19 DIAGNOSIS — R451 Restlessness and agitation: Secondary | ICD-10-CM | POA: Insufficient documentation

## 2023-10-19 DIAGNOSIS — Y92481 Parking lot as the place of occurrence of the external cause: Secondary | ICD-10-CM | POA: Diagnosis not present

## 2023-10-19 DIAGNOSIS — I6782 Cerebral ischemia: Secondary | ICD-10-CM | POA: Diagnosis not present

## 2023-10-19 DIAGNOSIS — Y908 Blood alcohol level of 240 mg/100 ml or more: Secondary | ICD-10-CM | POA: Diagnosis not present

## 2023-10-19 DIAGNOSIS — W010XXA Fall on same level from slipping, tripping and stumbling without subsequent striking against object, initial encounter: Secondary | ICD-10-CM | POA: Diagnosis not present

## 2023-10-19 DIAGNOSIS — F10920 Alcohol use, unspecified with intoxication, uncomplicated: Secondary | ICD-10-CM

## 2023-10-19 DIAGNOSIS — S0081XA Abrasion of other part of head, initial encounter: Secondary | ICD-10-CM | POA: Insufficient documentation

## 2023-10-19 DIAGNOSIS — Z8673 Personal history of transient ischemic attack (TIA), and cerebral infarction without residual deficits: Secondary | ICD-10-CM | POA: Diagnosis not present

## 2023-10-19 DIAGNOSIS — S0292XA Unspecified fracture of facial bones, initial encounter for closed fracture: Secondary | ICD-10-CM

## 2023-10-19 DIAGNOSIS — W19XXXA Unspecified fall, initial encounter: Secondary | ICD-10-CM

## 2023-10-19 DIAGNOSIS — Z7901 Long term (current) use of anticoagulants: Secondary | ICD-10-CM | POA: Diagnosis not present

## 2023-10-19 DIAGNOSIS — S0285XA Fracture of orbit, unspecified, initial encounter for closed fracture: Secondary | ICD-10-CM

## 2023-10-19 DIAGNOSIS — F1010 Alcohol abuse, uncomplicated: Secondary | ICD-10-CM | POA: Insufficient documentation

## 2023-10-19 LAB — CBC WITH DIFFERENTIAL/PLATELET
Abs Immature Granulocytes: 0.03 10*3/uL (ref 0.00–0.07)
Basophils Absolute: 0 10*3/uL (ref 0.0–0.1)
Basophils Relative: 1 %
Eosinophils Absolute: 0 10*3/uL (ref 0.0–0.5)
Eosinophils Relative: 0 %
HCT: 41.5 % (ref 39.0–52.0)
Hemoglobin: 14 g/dL (ref 13.0–17.0)
Immature Granulocytes: 1 %
Lymphocytes Relative: 36 %
Lymphs Abs: 2.1 10*3/uL (ref 0.7–4.0)
MCH: 33.7 pg (ref 26.0–34.0)
MCHC: 33.7 g/dL (ref 30.0–36.0)
MCV: 100 fL (ref 80.0–100.0)
Monocytes Absolute: 0.5 10*3/uL (ref 0.1–1.0)
Monocytes Relative: 9 %
Neutro Abs: 3.2 10*3/uL (ref 1.7–7.7)
Neutrophils Relative %: 53 %
Platelets: 249 10*3/uL (ref 150–400)
RBC: 4.15 MIL/uL — ABNORMAL LOW (ref 4.22–5.81)
RDW: 13.1 % (ref 11.5–15.5)
WBC: 5.9 10*3/uL (ref 4.0–10.5)
nRBC: 0 % (ref 0.0–0.2)

## 2023-10-19 LAB — BASIC METABOLIC PANEL WITH GFR
Anion gap: 16 — ABNORMAL HIGH (ref 5–15)
BUN: 10 mg/dL (ref 8–23)
CO2: 23 mmol/L (ref 22–32)
Calcium: 10 mg/dL (ref 8.9–10.3)
Chloride: 102 mmol/L (ref 98–111)
Creatinine, Ser: 0.84 mg/dL (ref 0.61–1.24)
GFR, Estimated: >60 mL/min (ref >60)
Glucose, Bld: 90 mg/dL (ref 70–99)
Potassium: 3.3 mmol/L — ABNORMAL LOW (ref 3.5–5.1)
Sodium: 141 mmol/L (ref 135–145)

## 2023-10-19 LAB — ETHANOL: Alcohol, Ethyl (B): 272 mg/dL — ABNORMAL HIGH (ref <10)

## 2023-10-19 NOTE — Progress Notes (Signed)
 Orthopedic Tech Progress Note Patient Details:  Rajan Burgard 18-Apr-1951 [id number]  Level II trauma, no ortho tech needs at this time.  Patient ID: Mark [name], male   DOB: Nov 20, 1950, 73 y.o.   MRN: [id number]  Cozette Divine 10/19/2023, 2:18 PM

## 2023-10-19 NOTE — ED Provider Notes (Signed)
 [place] EMERGENCY DEPARTMENT AT [facility] Provider Note   CSN: [id number] Arrival date & time: 10/19/23  1400     History  Chief Complaint  Patient presents with   Level 2 Fall on thinners    Mark [name] is a 73 y.o. male.  Pt is a 72y/o male with PMH paroxysmal A-fib on Eliquis, chronic tremor, alcohol use, BPH who is presenting today as a level 2 trauma.  Patient was noted to be walking across a parking lot when he tripped and fell.  Patient did admit to drinking alcohol all day today.  He reports he is not sure what caused him to fall.  He has no specific complaints of pain reports he can move his arms and legs and he really just like to go home.  He cannot really remember what happened.  The history is provided by the patient, the EMS personnel and medical records.       Home Medications Prior to Admission medications   Medication Sig Start Date End Date Taking? Authorizing Provider  acetaminophen (TYLENOL) 500 MG tablet Take 2 tablets (1,000 mg total) by mouth every 6 (six) hours as needed for mild pain (pain score 1-3) (or Fever >/= 101). 05/08/23   Marlin Simmonds, PA-C  albuterol (PROVENTIL HFA;VENTOLIN HFA) 108 (90 Base) MCG/ACT inhaler Inhale 2 puffs into the lungs every 4 (four) hours as needed for wheezing or shortness of breath. 07/03/16   Horton, Vonzella Guernsey, MD  allopurinol (ZYLOPRIM) 100 MG tablet Take 50 mg by mouth daily. 05/14/23   [provider]  apixaban (ELIQUIS) 5 MG TABS tablet Take 1 tablet (5 mg total) by mouth 2 (two) times daily. 03/23/22 05/06/23  Bary Boss, DO  atorvastatin (LIPITOR) 20 MG tablet Take 20 mg by mouth daily. 01/03/22   [provider]  atorvastatin (LIPITOR) 40 MG tablet Take 40 mg by mouth at bedtime. 05/14/23   [provider]  benzonatate (TESSALON) 100 MG capsule Take 1 capsule (100 mg total) by mouth every 8 (eight) hours. 07/23/22   Coretha Dew, PA-C  carvedilol (COREG) 12.5 MG tablet  Take 1 tablet (12.5 mg total) by mouth 2 (two) times daily with a meal. 05/08/23 06/07/23  Lesa Rape, MD  docusate sodium (COLACE) 100 MG capsule Take 100 mg by mouth 2 (two) times daily as needed. 05/04/23   [provider]  furosemide (LASIX) 20 MG tablet Take 1 tablet (20 mg total) by mouth daily. 03/24/22 05/06/23  Bary Boss, DO  hydrOXYzine (ATARAX) 10 MG tablet Take 10-20 mg by mouth at bedtime. 02/19/22   [provider]  losartan (COZAAR) 25 MG tablet Take 25 mg by mouth every morning. 01/31/23   [provider]  metoprolol succinate (TOPROL-XL) 100 MG 24 hr tablet Take 100 mg by mouth daily. 05/20/23   [provider]  Multiple Vitamin (MULTIVITAMIN WITH MINERALS) TABS tablet Take 1 tablet by mouth daily.    [provider]  nicotine (NICODERM CQ - DOSED IN MG/24 HR) 7 mg/24hr patch Place 1 patch (7 mg total) onto the skin daily. 03/24/22   Bary Boss, DO  omeprazole (PRILOSEC) 20 MG capsule Take 1 capsule (20 mg total) by mouth daily. Patient taking differently: Take 20 mg by mouth daily as needed (acid reflux). 10/28/19   Tegeler, Marine Sia, MD  ondansetron (ZOFRAN) 4 MG tablet Take 1 tablet (4 mg total) by mouth every 8 (eight) hours as needed for nausea or vomiting.  09/05/23   Tonya Fredrickson, MD  polyethylene glycol (MIRALAX) 17 g packet Take 17 g by mouth daily. 06/22/23   Carin Charleston, MD  predniSONE (DELTASONE) 20 MG tablet Take 2 tablets (40 mg total) by mouth daily. 07/29/23   Horton, Vonzella Guernsey, MD  senna-docusate (SENOKOT-S) 8.6-50 MG tablet Take 1 tablet by mouth at bedtime as needed for moderate constipation. 06/22/23   Carin Charleston, MD  SYMBICORT 160-4.5 MCG/ACT inhaler Inhale 2 puffs into the lungs daily.  10/14/19   [provider]  traMADol (ULTRAM) 50 MG tablet Take 1 tablet (50 mg total) by mouth every 6 (six) hours as needed. 05/08/23 05/07/24  Marlin Simmonds, PA-C  TRELEGY ELLIPTA 200-62.5-25 MCG/ACT AEPB  Inhale 1 puff into the lungs daily. 02/20/22   [provider]      Allergies    Patient has no known allergies.    Review of Systems   Review of Systems  Physical Exam Updated Vital Signs BP (S) 132/82 (BP Location: Left Arm)   Pulse 94   Temp 97.8 F (36.6 C) (Oral)   Resp 16   Ht 6\' 2"  (1.88 m)   Wt 74.8 kg   SpO2 98%   BMI 21.18 kg/m  Physical Exam Vitals and nursing note reviewed.  Constitutional:      General: He is not in acute distress.    Appearance: He is well-developed.     Comments: Smells of alcohol  HENT:     Head: Normocephalic. Abrasion present.      Comments: Small abrasion noted over the right cheek but no other evidence of trauma Eyes:     Conjunctiva/sclera: Conjunctivae normal.     Pupils: Pupils are equal, round, and reactive to light.  Cardiovascular:     Rate and Rhythm: Normal rate and regular rhythm.     Heart sounds: No murmur heard. Pulmonary:     Effort: Pulmonary effort is normal. No respiratory distress.     Breath sounds: Normal breath sounds. No wheezing or rales.  Abdominal:     General: There is no distension.     Palpations: Abdomen is soft.     Tenderness: There is no abdominal tenderness. There is no guarding or rebound.  Musculoskeletal:        General: No tenderness. Normal range of motion.     Cervical back: Normal range of motion and neck supple. No tenderness.  Skin:    General: Skin is warm and dry.     Findings: No erythema or rash.  Neurological:     Mental Status: He is alert and oriented to person, place, and time.     Sensory: No sensory deficit.     Motor: No weakness.     Comments: 5 out of 5 strength in bilateral upper and lower extremities  Psychiatric:     Comments: Mildly agitated but cooperative     ED Results / Procedures / Treatments   Labs (all labs ordered are listed, but only abnormal results are displayed) Labs Reviewed  CBC WITH DIFFERENTIAL/PLATELET  BASIC METABOLIC PANEL WITH GFR   ETHANOL    EKG None  Radiology No results found.  Procedures Procedures    Medications Ordered in ED Medications - No data to display  ED Course/ Medical Decision Making/ A&P  Medical Decision Making Amount and/or Complexity of Data Reviewed Labs: ordered. Radiology: ordered.   Pt with multiple medical problems and comorbidities and presenting today with a complaint that caries a high risk for morbidity and mortality.  Here today after he fell in a parking lot and has evidence of injury to his face and a history of being on Eliquis.  Patient does smell of alcohol and does admit to drinking today.  Suspect that is most likely the cause of his fall.  He otherwise seems neurologically intact.  He is able to move his upper and lower extremities without any difficulty.  Labs and imaging are pending.  Currently hemodynamically stable.         Final Clinical Impression(s) / ED Diagnoses Final diagnoses:  None    Rx / DC Orders ED Discharge Orders     None         Almond Army, MD 10/19/23 1505

## 2023-10-19 NOTE — ED Notes (Signed)
 Patients son updated on phone by this RN. States he is at work and cannot pick up father however will coordinate a ride. Patient speaking with son on phone now.

## 2023-10-19 NOTE — ED Notes (Signed)
 Trauma Response Nurse Documentation  Mark [name] is a 73 y.o. male arriving to [facility] ED via EMS  On Eliquis (apixaban) daily. Trauma was activated as a Level 2 based on the following trauma criteria Elderly patients > 65 with head trauma on anti-coagulation (excluding ASA).  Patient cleared for CT by Dr. Leida Puna. Pt transported to CT with trauma response nurse present to monitor. RN remained with the patient throughout their absence from the department for clinical observation. GCS 15.  History   Past Medical History:  Diagnosis Date   Abnormal thyroid function test    Alcohol abuse    Allergic rhinitis    Asthma    BPH (benign prostatic hyperplasia)    Carpal tunnel syndrome    Chronic heart failure with preserved ejection fraction (HFpEF) (HCC)    Delirium    Hypertension    PAF (paroxysmal atrial fibrillation) (HCC)    Tobacco abuse    Type 2 MI (myocardial infarction) [facility])      Past Surgical History:  Procedure Laterality Date   INGUINAL HERNIA REPAIR Bilateral 05/06/2023   Procedure: HERNIA REPAIR INGUINAL ADULT with MESH;  Surgeon: Oza Blumenthal, MD;  Location: [facility] ORS;  Service: General;  Laterality: Bilateral;     Initial Focused Assessment (If applicable, or please see trauma documentation): Patient A&Ox4, GCS 15, PERR 3 Airway intact, bilateral breath sounds Pulses 2+  CT's Completed:   CT Head   Interventions:  Labs CT Head  Plan for disposition:  Discharge home   Event Summary: Patient to ED after falling and hitting his head, takes Eliquis however states he does not take it regularly and has not had it in several days. Patient endorses ETOH use. Imaging was ordered and revealed no traumatic injury. Patient's son organized and [employer] to pick him up from the hospital and take him home.  Bedside handoff with ED RN Alston Jerry.    Mark [name]  Trauma Response RN  Please call TRN at [phone] for further assistance.

## 2023-10-19 NOTE — ED Notes (Signed)
 Son Mark [name] [phone] would like an update immediately, he has not idea why his father is in the hospital

## 2023-10-19 NOTE — ED Notes (Signed)
 RN reached out to son, Mr. Nolon Baxter, he is going to schedule an [employer], and have them call my work phone when they arrive. Patient has access to his his home. Patient is ambulatory & alert/oriented.

## 2023-10-24 NOTE — Progress Notes (Signed)
 Mark [name]. Pilar Plate, MD [facility] [email or web address]

## 2023-11-11 IMAGING — DX DG KNEE COMPLETE 4+V*L*
4 series · 4 of 4 positions shown · non-contrast
Comparison: None Available.

CLINICAL DATA: Left knee pain, recent fall

EXAM:
LEFT KNEE - COMPLETE 4+ VIEW

[knee ap]
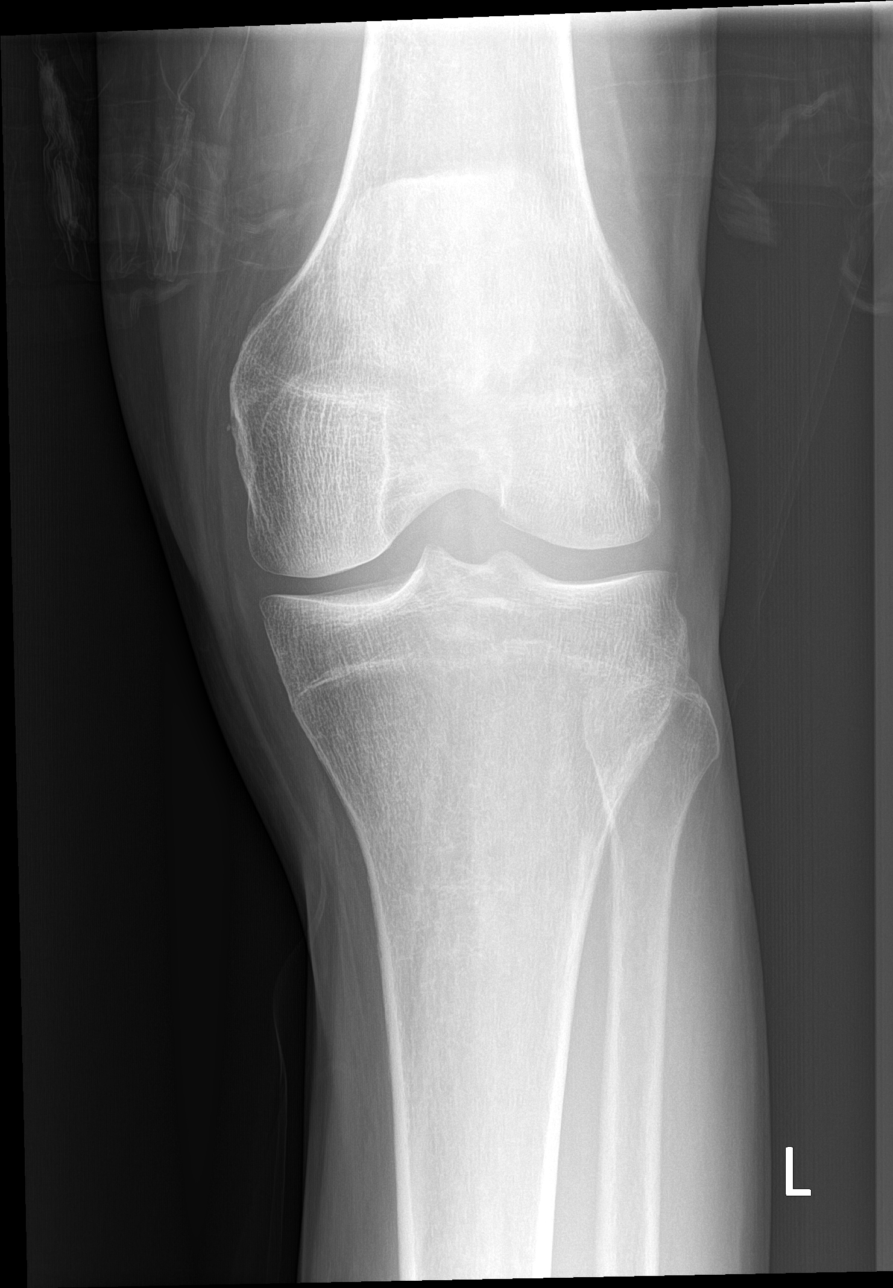

[knee obl (1 of 2)]
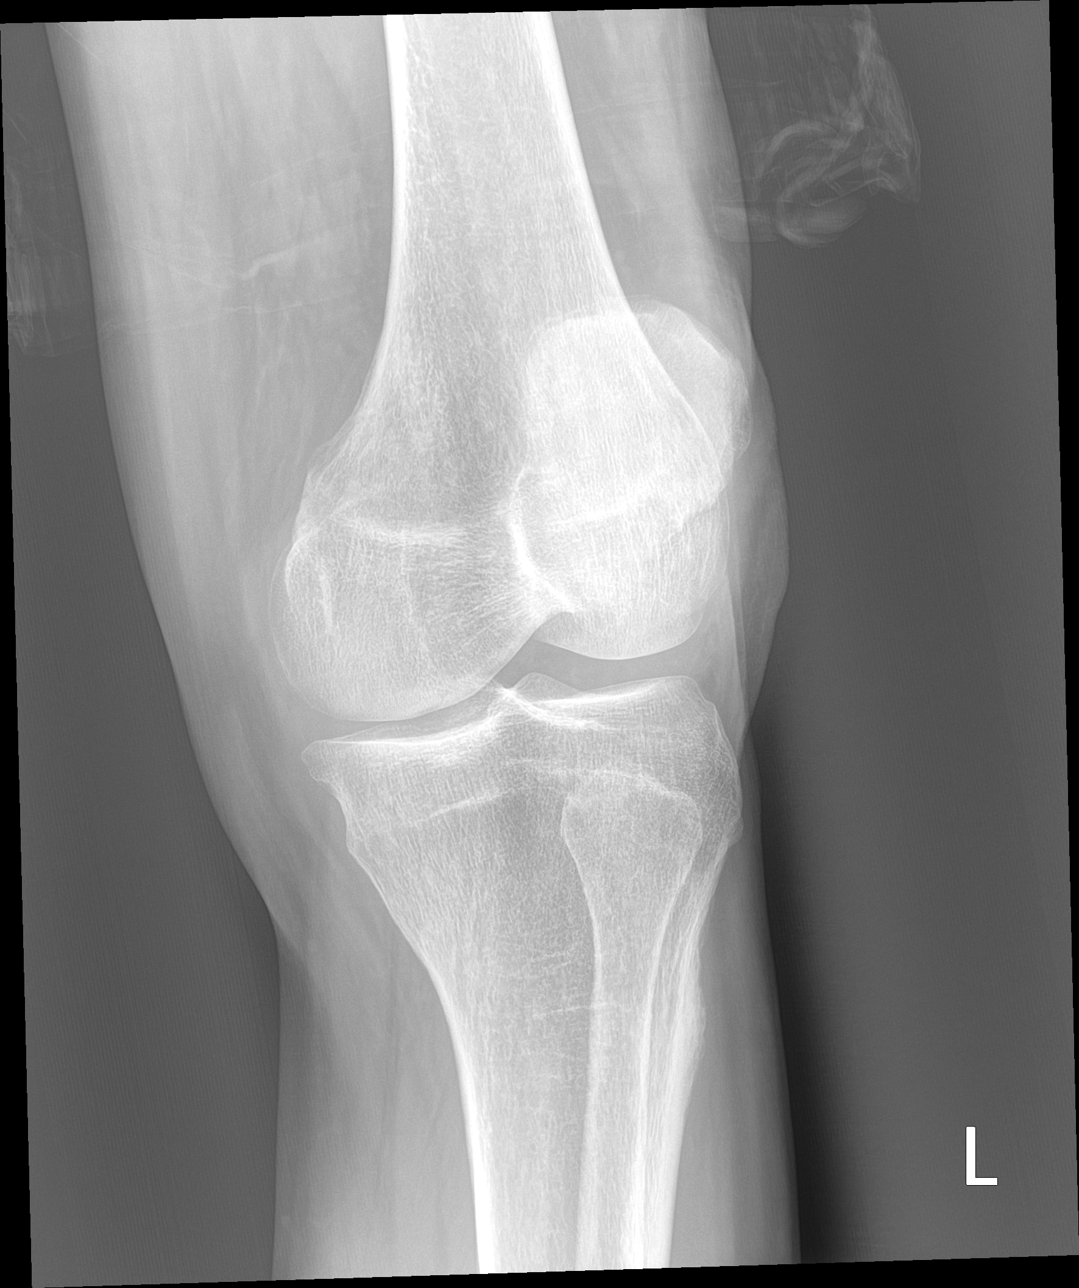

[knee obl (2 of 2)]
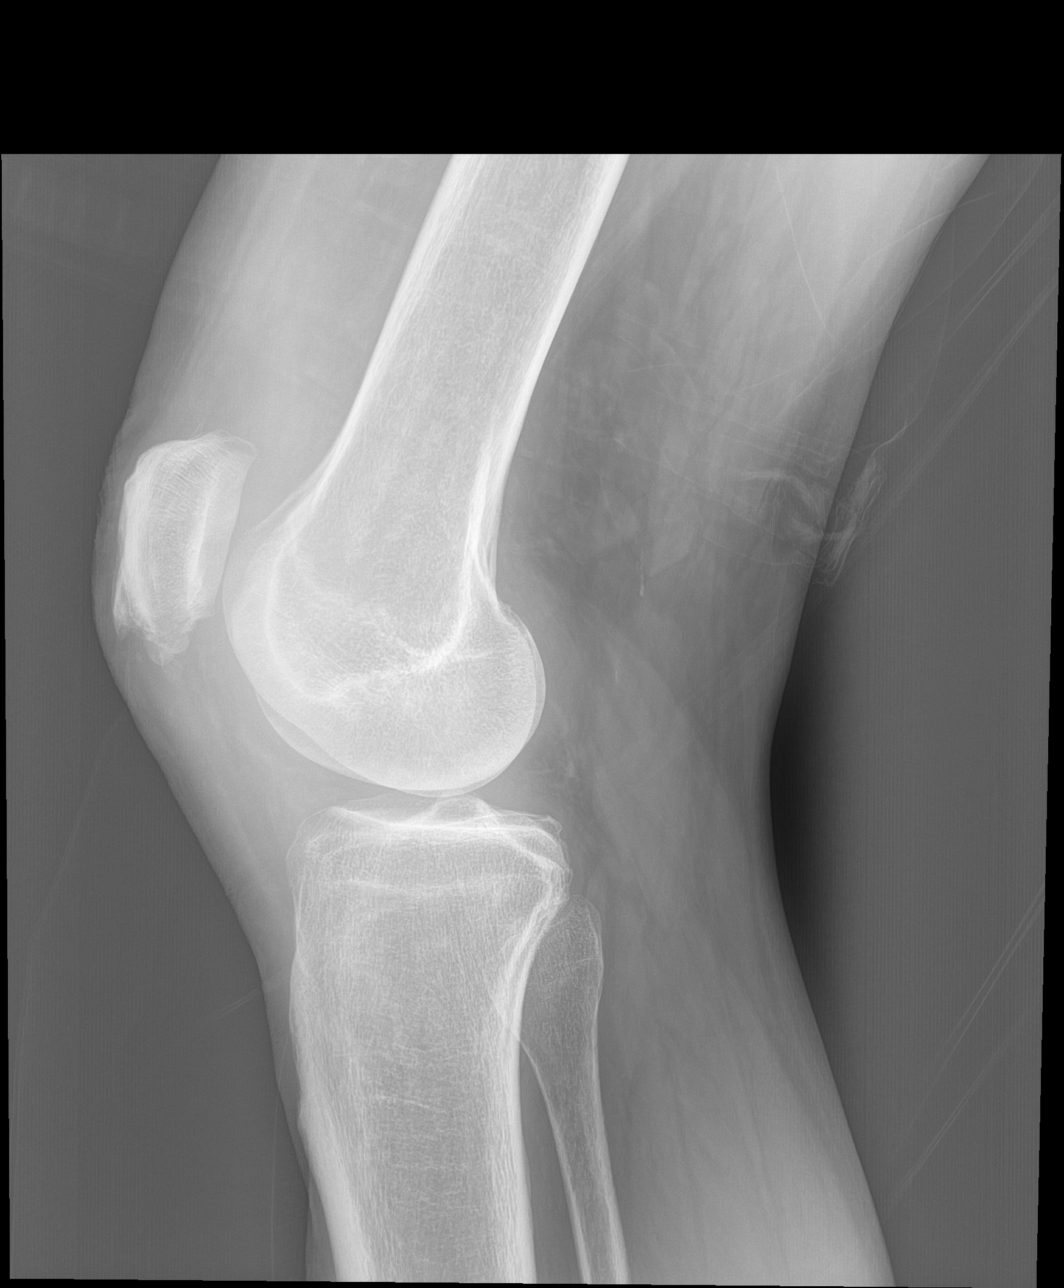

[knee lat]
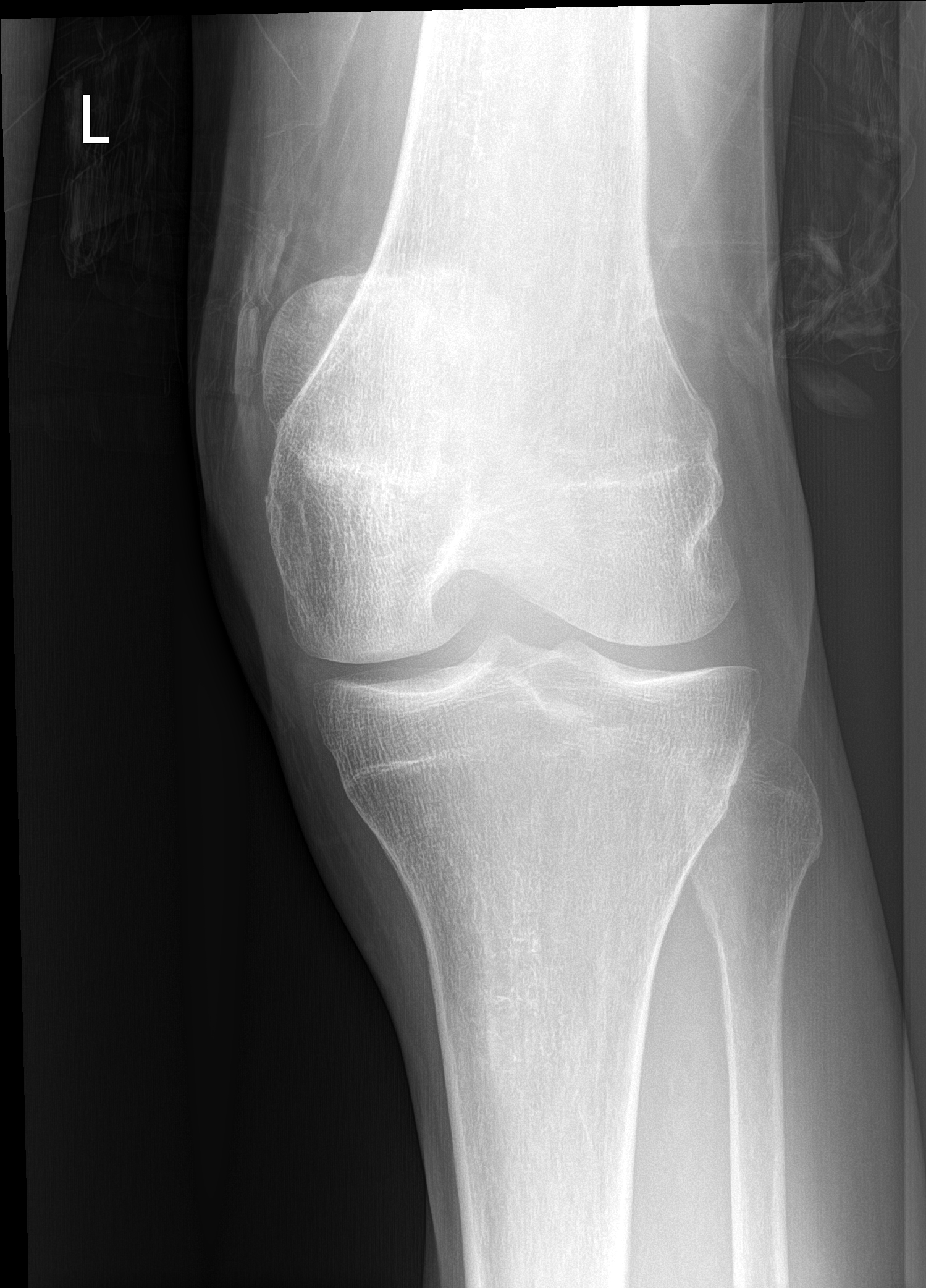

[4 of 4 positions shown; findings below may reference images not displayed]

FINDINGS: Alignment is anatomic. No acute fracture. Joint spaces are
preserved. Possible joint effusion.
IMPRESSION: No acute fracture.  Possible joint effusion.

## 2023-12-16 ENCOUNTER — Other Ambulatory Visit: Payer: Self-pay

## 2023-12-16 ENCOUNTER — Observation Stay (HOSPITAL_COMMUNITY)
Admission: EM | Admit: 2023-12-16 | Discharge: 2023-12-17 | Disposition: A | Discharge status: Home / Self Care | Attending: Internal Medicine | Admitting: Internal Medicine

## 2023-12-16 ENCOUNTER — Encounter (HOSPITAL_COMMUNITY): Payer: Self-pay

## 2023-12-16 ENCOUNTER — Emergency Department (HOSPITAL_COMMUNITY)

## 2023-12-16 DIAGNOSIS — J45901 Unspecified asthma with (acute) exacerbation: Secondary | ICD-10-CM | POA: Insufficient documentation

## 2023-12-16 DIAGNOSIS — I110 Hypertensive heart disease with heart failure: Secondary | ICD-10-CM | POA: Insufficient documentation

## 2023-12-16 DIAGNOSIS — R296 Repeated falls: Secondary | ICD-10-CM | POA: Diagnosis not present

## 2023-12-16 DIAGNOSIS — R7401 Elevation of levels of liver transaminase levels: Secondary | ICD-10-CM | POA: Diagnosis not present

## 2023-12-16 DIAGNOSIS — I161 Hypertensive emergency: Principal | ICD-10-CM | POA: Insufficient documentation

## 2023-12-16 DIAGNOSIS — J441 Chronic obstructive pulmonary disease with (acute) exacerbation: Secondary | ICD-10-CM | POA: Diagnosis not present

## 2023-12-16 DIAGNOSIS — R059 Cough, unspecified: Secondary | ICD-10-CM | POA: Diagnosis not present

## 2023-12-16 DIAGNOSIS — Z7901 Long term (current) use of anticoagulants: Secondary | ICD-10-CM | POA: Diagnosis not present

## 2023-12-16 DIAGNOSIS — F1010 Alcohol abuse, uncomplicated: Secondary | ICD-10-CM | POA: Insufficient documentation

## 2023-12-16 DIAGNOSIS — Z79899 Other long term (current) drug therapy: Secondary | ICD-10-CM | POA: Insufficient documentation

## 2023-12-16 DIAGNOSIS — I5022 Chronic systolic (congestive) heart failure: Secondary | ICD-10-CM | POA: Diagnosis not present

## 2023-12-16 DIAGNOSIS — R0602 Shortness of breath: Secondary | ICD-10-CM | POA: Diagnosis present

## 2023-12-16 DIAGNOSIS — I480 Paroxysmal atrial fibrillation: Secondary | ICD-10-CM | POA: Insufficient documentation

## 2023-12-16 DIAGNOSIS — Z7951 Long term (current) use of inhaled steroids: Secondary | ICD-10-CM | POA: Insufficient documentation

## 2023-12-16 LAB — CBC WITH DIFFERENTIAL/PLATELET
Abs Immature Granulocytes: 0.02 10*3/uL (ref 0.00–0.07)
Basophils Absolute: 0 10*3/uL (ref 0.0–0.1)
Basophils Relative: 1 %
Eosinophils Absolute: 0 10*3/uL (ref 0.0–0.5)
Eosinophils Relative: 1 %
HCT: 37.3 % — ABNORMAL LOW (ref 39.0–52.0)
Hemoglobin: 12.7 g/dL — ABNORMAL LOW (ref 13.0–17.0)
Immature Granulocytes: 0 %
Lymphocytes Relative: 25 %
Lymphs Abs: 1.5 10*3/uL (ref 0.7–4.0)
MCH: 33.2 pg (ref 26.0–34.0)
MCHC: 34 g/dL (ref 30.0–36.0)
MCV: 97.6 fL (ref 80.0–100.0)
Monocytes Absolute: 0.5 10*3/uL (ref 0.1–1.0)
Monocytes Relative: 9 %
Neutro Abs: 3.8 10*3/uL (ref 1.7–7.7)
Neutrophils Relative %: 64 %
Platelets: 115 10*3/uL — ABNORMAL LOW (ref 150–400)
RBC: 3.82 MIL/uL — ABNORMAL LOW (ref 4.22–5.81)
RDW: 12.8 % (ref 11.5–15.5)
WBC: 5.9 10*3/uL (ref 4.0–10.5)
nRBC: 0 % (ref 0.0–0.2)

## 2023-12-16 LAB — RESP PANEL BY RT-PCR (RSV, FLU A&B, COVID)  RVPGX2
Influenza A by PCR: NEGATIVE
Influenza B by PCR: NEGATIVE
Resp Syncytial Virus by PCR: NEGATIVE
SARS Coronavirus 2 by RT PCR: NEGATIVE

## 2023-12-16 LAB — TROPONIN I (HIGH SENSITIVITY)
Troponin I (High Sensitivity): 13 ng/L (ref <18)
Troponin I (High Sensitivity): 12 ng/L (ref <18)

## 2023-12-16 LAB — COMPREHENSIVE METABOLIC PANEL WITH GFR
ALT: 84 U/L — ABNORMAL HIGH (ref 0–44)
AST: 129 U/L — ABNORMAL HIGH (ref 15–41)
Albumin: 3.7 g/dL (ref 3.5–5.0)
Alkaline Phosphatase: 71 U/L (ref 38–126)
Anion gap: 13 (ref 5–15)
BUN: 8 mg/dL (ref 8–23)
CO2: 24 mmol/L (ref 22–32)
Calcium: 8.7 mg/dL — ABNORMAL LOW (ref 8.9–10.3)
Chloride: 99 mmol/L (ref 98–111)
Creatinine, Ser: 0.89 mg/dL (ref 0.61–1.24)
GFR, Estimated: >60 mL/min (ref >60)
Glucose, Bld: 142 mg/dL — ABNORMAL HIGH (ref 70–99)
Potassium: 3.1 mmol/L — ABNORMAL LOW (ref 3.5–5.1)
Sodium: 136 mmol/L (ref 135–145)
Total Bilirubin: 1.9 mg/dL — ABNORMAL HIGH (ref 0.0–1.2)
Total Protein: 7.1 g/dL (ref 6.5–8.1)

## 2023-12-16 LAB — BRAIN NATRIURETIC PEPTIDE: B Natriuretic Peptide: 21.9 pg/mL (ref 0.0–100.0)

## 2023-12-16 MED ORDER — THIAMINE MONONITRATE 100 MG PO TABS
100.0000 mg | ORAL_TABLET | Freq: Every day | ORAL | Status: DC
Start: 2023-12-16 — End: 2023-12-17
  Administered 2023-12-16 – 2023-12-17 (×2): 100 mg via ORAL
  Filled 2023-12-16 (×2): qty 1

## 2023-12-16 MED ORDER — ALBUTEROL SULFATE (2.5 MG/3ML) 0.083% IN NEBU
2.5000 mg | INHALATION_SOLUTION | Freq: Once | RESPIRATORY_TRACT | Status: AC
  Administered 2023-12-16: 2.5 mg via RESPIRATORY_TRACT
  Filled 2023-12-16: qty 3

## 2023-12-16 MED ORDER — FOLIC ACID 1 MG PO TABS
1.0000 mg | ORAL_TABLET | Freq: Every day | ORAL | Status: DC
Start: 2023-12-16 — End: 2023-12-17
  Administered 2023-12-16 – 2023-12-17 (×2): 1 mg via ORAL
  Filled 2023-12-16 (×2): qty 1

## 2023-12-16 MED ORDER — ACETAMINOPHEN 650 MG RE SUPP: 650.0000 mg | Freq: Four times a day (QID) | RECTAL | Status: DC | PRN

## 2023-12-16 MED ORDER — ALBUTEROL SULFATE (2.5 MG/3ML) 0.083% IN NEBU: 2.5000 mg | INHALATION_SOLUTION | Freq: Once | RESPIRATORY_TRACT | Status: DC

## 2023-12-16 MED ORDER — IPRATROPIUM-ALBUTEROL 0.5-2.5 (3) MG/3ML IN SOLN
3.0000 mL | Freq: Once | RESPIRATORY_TRACT | Status: AC
  Administered 2023-12-16: 3 mL via RESPIRATORY_TRACT
  Filled 2023-12-16: qty 3

## 2023-12-16 MED ORDER — METOPROLOL SUCCINATE ER 50 MG PO TB24
100.0000 mg | ORAL_TABLET | Freq: Every day | ORAL | Status: DC
  Administered 2023-12-17: 100 mg via ORAL
  Filled 2023-12-16: qty 2

## 2023-12-16 MED ORDER — PANTOPRAZOLE SODIUM 40 MG PO TBEC: 40.0000 mg | DELAYED_RELEASE_TABLET | Freq: Every day | ORAL | Status: DC | PRN

## 2023-12-16 MED ORDER — OMEPRAZOLE MAGNESIUM 20 MG PO TBEC: 20.0000 mg | DELAYED_RELEASE_TABLET | Freq: Every day | ORAL | Status: DC | PRN

## 2023-12-16 MED ORDER — LOSARTAN POTASSIUM 50 MG PO TABS
25.0000 mg | ORAL_TABLET | Freq: Once | ORAL | Status: AC
  Administered 2023-12-16: 25 mg via ORAL
  Filled 2023-12-16: qty 1

## 2023-12-16 MED ORDER — FLUTICASONE FUROATE-VILANTEROL 200-25 MCG/ACT IN AEPB
1.0000 | INHALATION_SPRAY | Freq: Every day | RESPIRATORY_TRACT | Status: DC
  Administered 2023-12-16 – 2023-12-17 (×2): 1 via RESPIRATORY_TRACT
  Filled 2023-12-16: qty 28

## 2023-12-16 MED ORDER — IPRATROPIUM-ALBUTEROL 0.5-2.5 (3) MG/3ML IN SOLN: 3.0000 mL | Freq: Four times a day (QID) | RESPIRATORY_TRACT | Status: DC | PRN

## 2023-12-16 MED ORDER — POTASSIUM CHLORIDE CRYS ER 20 MEQ PO TBCR
40.0000 meq | EXTENDED_RELEASE_TABLET | Freq: Once | ORAL | Status: AC
  Administered 2023-12-16: 40 meq via ORAL
  Filled 2023-12-16: qty 2

## 2023-12-16 MED ORDER — RIVAROXABAN 10 MG PO TABS
10.0000 mg | ORAL_TABLET | Freq: Every day | ORAL | Status: DC
Start: 2023-12-16 — End: 2023-12-16

## 2023-12-16 MED ORDER — METOPROLOL SUCCINATE ER 25 MG PO TB24
100.0000 mg | ORAL_TABLET | Freq: Once | ORAL | Status: AC
  Administered 2023-12-16: 100 mg via ORAL
  Filled 2023-12-16: qty 4

## 2023-12-16 MED ORDER — LOSARTAN POTASSIUM 50 MG PO TABS
25.0000 mg | ORAL_TABLET | Freq: Every morning | ORAL | Status: DC
  Administered 2023-12-17: 25 mg via ORAL
  Filled 2023-12-16: qty 1

## 2023-12-16 MED ORDER — METHYLPREDNISOLONE SODIUM SUCC 125 MG IJ SOLR
125.0000 mg | Freq: Once | INTRAMUSCULAR | Status: AC
  Administered 2023-12-16: 125 mg via INTRAVENOUS
  Filled 2023-12-16: qty 2

## 2023-12-16 MED ORDER — ACETAMINOPHEN 325 MG PO TABS: 650.0000 mg | ORAL_TABLET | Freq: Four times a day (QID) | ORAL | Status: DC | PRN

## 2023-12-16 MED ORDER — POLYETHYLENE GLYCOL 3350 17 G PO PACK: 17.0000 g | PACK | Freq: Every day | ORAL | Status: DC | PRN

## 2023-12-16 MED ORDER — IPRATROPIUM-ALBUTEROL 0.5-2.5 (3) MG/3ML IN SOLN: 3.0000 mL | Freq: Once | RESPIRATORY_TRACT | Status: DC

## 2023-12-16 MED ORDER — APIXABAN 5 MG PO TABS
5.0000 mg | ORAL_TABLET | Freq: Two times a day (BID) | ORAL | Status: DC
  Administered 2023-12-16 – 2023-12-17 (×2): 5 mg via ORAL
  Filled 2023-12-16 (×2): qty 1

## 2023-12-16 MED ORDER — ADULT MULTIVITAMIN W/MINERALS CH
1.0000 | ORAL_TABLET | Freq: Every day | ORAL | Status: DC
Start: 2023-12-16 — End: 2023-12-17
  Administered 2023-12-16 – 2023-12-17 (×2): 1 via ORAL
  Filled 2023-12-16 (×2): qty 1

## 2023-12-16 MED ORDER — ENSURE PLUS HIGH PROTEIN PO LIQD
237.0000 mL | Freq: Two times a day (BID) | ORAL | Status: DC
  Administered 2023-12-17 (×2): 237 mL via ORAL

## 2023-12-16 MED ORDER — BENZONATATE 100 MG PO CAPS: 100.0000 mg | ORAL_CAPSULE | Freq: Three times a day (TID) | ORAL | Status: DC | PRN

## 2023-12-16 MED ORDER — PREDNISONE 20 MG PO TABS
40.0000 mg | ORAL_TABLET | Freq: Every day | ORAL | Status: DC
  Administered 2023-12-17: 40 mg via ORAL
  Filled 2023-12-16: qty 2

## 2023-12-16 NOTE — ED Notes (Signed)
 Patient declined walking per provider order, Aldean Amass, MD notified.

## 2023-12-16 NOTE — ED Notes (Signed)
Help get patient into a gown on the monitor did EKG shown to er provider patient is resting with call bell in reach  

## 2023-12-16 NOTE — H&P (Cosign Needed Addendum)
 Date: 12/16/2023               Patient Name:  Mark [name] MRN: [id number]  DOB: [birth year] Age / Sex: 73 y.o., male   PCP: Maryellen Snare, NP         Medical Service: Internal Medicine Teaching Service         Attending Physician: Dr. Bevelyn Bryant, MD      First Contact 24/7: Dr Carleen Chary Pager:  [phone]  Second Contact 24/7: Dr Malen Scudder Pager:  [phone]   SUBJECTIVE   Chief Complaint: Fall, dyspnea  History of Present Illness: Mark [name] is a 73 y.o. male with PMH of asthma, COPD, Ethanol abuse, tobacco abuse, a.fib on Eliquis , HFrEF EF 30-35%.  Presented to the ED this morning after a fall.  He also reports progressive shortness of breath.  Regarding his dyspnea, reports progressive symptoms for the past 3 weeks.  He has a history of asthma for which he takes Symbicort twice daily and albuterol  as needed.  Typically he uses his albuterol  only occasionally but lately increased to multiple times daily. He reports a cough productive of yellow sputum that is normal for him. He is not sure what caused it. Denies sick contacts, fevers, chills. He ascribes to seasonal allergies. No other asthma triggers that he knows of.  He has nocturnal dyspnea but not orthopnea and reports using nebulizer.  Reports an ER visit approximately one month ago for asthma. He does not see a pulmonologist.  Regarding his falls, he reports this is ongoing for several months. He reports he stands and gets lightheaded and falls as a result. He denies acute trauma or injury from his fall this morning. He feels that a mild tremor in the hands is present for that time as well.  He denies headache, vision changes, CP, palpitations, abdominal pain, changes in bowel/bladder, N/V/D. He reports reduced appetite over several months.  He drinks 40 oz alcohol daily with some liquor as well. He would like to quit. He denies severe withdrawals, including never having a seizure.  He only takes medicine  intermittently, with the exception of his inhalers/nebulizers that he takes regularly. He most recently took his pill medicines on Friday, three days ago, and takes them approximately twice weekly.  ED Course: Vitals were BP (!) 194/103  Pulse 87  Temp 98.8 F (37.1 C) (Oral)  Resp 20  SpO2 97%  Labs significant for transaminates AST 129 and ALT 84, no white count, neg troponin, neg RVP, BNP 21 Imaging CXR indeterminate rib fx, R apical atelectasis Received Duonebs, methylprednisolone , potassium, losartan , metoprolol  succinate Consulted IMTS for admission for HTN and asthma  Past Medical History Past Medical History:  Diagnosis Date   Abnormal thyroid  function test    Alcohol abuse    Allergic rhinitis    Asthma    BPH (benign prostatic hyperplasia)    Carpal tunnel syndrome    Chronic heart failure with preserved ejection fraction (HFpEF) (HCC)    Delirium    Hypertension    PAF (paroxysmal atrial fibrillation) (HCC)    Tobacco abuse    Type 2 MI (myocardial infarction) (HCC)      Meds:  He reports taking Symbicort BID and albuterol  as needed and nebulizer. Otherwise he reports only intermittent adherence to his below medicines. No current facility-administered medications on file prior to encounter.   Current Outpatient Medications on File Prior to Encounter  Medication Sig Dispense Refill   albuterol  (PROVENTIL  HFA;VENTOLIN  HFA) 108 (90  Base) MCG/ACT inhaler Inhale 2 puffs into the lungs every 4 (four) hours as needed for wheezing or shortness of breath. 1 Inhaler 0   SYMBICORT 160-4.5 MCG/ACT inhaler Inhale 2 puffs into the lungs daily.      acetaminophen  (TYLENOL ) 500 MG tablet Take 2 tablets (1,000 mg total) by mouth every 6 (six) hours as needed for mild pain (pain score 1-3) (or Fever >/= 101).     allopurinol (ZYLOPRIM) 100 MG tablet Take 50 mg by mouth daily.     apixaban  (ELIQUIS ) 5 MG TABS tablet Take 1 tablet (5 mg total) by mouth 2 (two) times daily. 180 tablet  0   atorvastatin  (LIPITOR) 20 MG tablet Take 20 mg by mouth daily.     atorvastatin  (LIPITOR) 40 MG tablet Take 40 mg by mouth at bedtime.     benzonatate  (TESSALON ) 100 MG capsule Take 1 capsule (100 mg total) by mouth every 8 (eight) hours. 21 capsule 0   carvedilol  (COREG ) 12.5 MG tablet Take 1 tablet (12.5 mg total) by mouth 2 (two) times daily with a meal. 60 tablet 0   docusate sodium  (COLACE) 100 MG capsule Take 100 mg by mouth 2 (two) times daily as needed.     furosemide  (LASIX ) 20 MG tablet Take 1 tablet (20 mg total) by mouth daily. 30 tablet 0   hydrOXYzine  (ATARAX ) 10 MG tablet Take 10-20 mg by mouth at bedtime.     losartan  (COZAAR ) 25 MG tablet Take 25 mg by mouth every morning.     metoprolol  succinate (TOPROL -XL) 100 MG 24 hr tablet Take 100 mg by mouth daily.     Multiple Vitamin (MULTIVITAMIN WITH MINERALS) TABS tablet Take 1 tablet by mouth daily.     nicotine  (NICODERM CQ  - DOSED IN MG/24 HR) 7 mg/24hr patch Place 1 patch (7 mg total) onto the skin daily. 28 patch 0   omeprazole  (PRILOSEC OTC) 20 MG tablet Take 20 mg by mouth daily as needed (for reflux or heartburn).     omeprazole  (PRILOSEC) 20 MG capsule Take 1 capsule (20 mg total) by mouth daily. (Patient taking differently: Take 20 mg by mouth daily as needed (acid reflux).) 30 capsule 0   ondansetron  (ZOFRAN ) 4 MG tablet Take 1 tablet (4 mg total) by mouth every 8 (eight) hours as needed for nausea or vomiting. 15 tablet 0   polyethylene glycol (MIRALAX ) 17 g packet Take 17 g by mouth daily. 14 each 0   predniSONE  (DELTASONE ) 20 MG tablet Take 2 tablets (40 mg total) by mouth daily. 10 tablet 0   senna-docusate (SENOKOT-S) 8.6-50 MG tablet Take 1 tablet by mouth at bedtime as needed for moderate constipation. 30 tablet 0   traMADol  (ULTRAM ) 50 MG tablet Take 1 tablet (50 mg total) by mouth every 6 (six) hours as needed. 20 tablet 0   TRELEGY ELLIPTA 200-62.5-25 MCG/ACT AEPB Inhale 1 puff into the lungs daily.       Past Surgical History Past Surgical History:  Procedure Laterality Date   INGUINAL HERNIA REPAIR Bilateral 05/06/2023   Procedure: HERNIA REPAIR INGUINAL ADULT with MESH;  Surgeon: Oza Blumenthal, MD;  Location: [facility];  Service: General;  Laterality: Bilateral;    Social:  Lives alone in a home Occupation: retired Support: limited Level of Function: dependent for house work due to falls PCP: Maryellen Snare, NP Substances: less than 1/4ppd cigarettes, ethanol 40oz alcohol and liquor, no recreational drugs  Allergies: Allergies as of 12/16/2023   (No Known Allergies)  Review of Systems: A complete ROS was negative except as per HPI.   OBJECTIVE:   Physical Exam: Blood pressure (!) 166/118, pulse 93, temperature 98.8 F (37.1 C), temperature source Oral, resp. rate 18, SpO2 98%.  Constitutional: chronically ill-appearing. In no acute distress. HENT: Normocephalic, atraumatic,  Eyes: Sclera non-icteric, PERRL, EOM intact Neck:normal atraumatic, no neck masses, normal thyroid , no jvd Cardio:Regular rate and rhythm. No murmurs, rubs, or gallops. 2+ bilateral radial and dorsalis pedis  pulses. Pulm:Diffuse expiratory wheezes without rales/rhonchi. Normal work of breathing on room air. Abdomen: Soft, non-tender, non-distended, positive bowel sounds. BJY:NWGNFAOZ for extremity edema. Moderate tremor of both arms present with action and rest, does not go away with distraction Skin:Warm and dry. Neuro:Alert and oriented x3. No focal deficit noted. Psych:Pleasant mood and affect.  Labs: CBC    Component Value Date/Time   WBC 5.9 12/16/2023 0927   RBC 3.82 (L) 12/16/2023 0927   HGB 12.7 (L) 12/16/2023 0927   HCT 37.3 (L) 12/16/2023 0927   PLT 115 (L) 12/16/2023 0927   MCV 97.6 12/16/2023 0927   MCH 33.2 12/16/2023 0927   MCHC 34.0 12/16/2023 0927   RDW 12.8 12/16/2023 0927   LYMPHSABS 1.5 12/16/2023 0927   MONOABS 0.5 12/16/2023 0927   EOSABS 0.0 12/16/2023 0927    BASOSABS 0.0 12/16/2023 0927     CMP     Component Value Date/Time   NA 136 12/16/2023 0927   K 3.1 (L) 12/16/2023 0927   CL 99 12/16/2023 0927   CO2 24 12/16/2023 0927   GLUCOSE 142 (H) 12/16/2023 0927   BUN 8 12/16/2023 0927   CREATININE 0.89 12/16/2023 0927   CALCIUM  8.7 (L) 12/16/2023 0927   PROT 7.1 12/16/2023 0927   ALBUMIN 3.7 12/16/2023 0927   AST 129 (H) 12/16/2023 0927   ALT 84 (H) 12/16/2023 0927   ALKPHOS 71 12/16/2023 0927   BILITOT 1.9 (H) 12/16/2023 0927   GFRNONAA >60 12/16/2023 0927   GFRAA >60 11/12/2019 0737    Imaging: DG Chest Portable 1 View Result Date: 12/16/2023 CLINICAL DATA:  Cough and shortness of breath x1 week. EXAM: PORTABLE CHEST 1 VIEW COMPARISON:  None Available. FINDINGS: The heart size and mediastinal contours are within normal limits. There is mild right apical scarring and/or atelectasis. No acute infiltrate, pleural effusion or pneumothorax is identified. Lateral sixth and seventh left rib fractures are seen. This is of indeterminate age and represents a new finding when compared to the prior study. IMPRESSION: 1. Mild right apical scarring and/or atelectasis. 2. Lateral sixth and seventh left rib fractures of indeterminate age. Electronically Signed   By: Virgle Grime M.D.   On: 12/16/2023 09:20     EKG: personally reviewed my interpretation is sinus rhythm, tachycardic, irregular, possible PACs.  ASSESSMENT & PLAN:   Assessment & Plan by Problem: Principal Problem:   Hypertensive emergency  Jancarlos Thrun is a 73 y.o. male with pertinent PMH of asthma, COPD, Ethanol abuse, tobacco abuse, a.fib on Eliquis , HFrEF EF 30-35% who presented with a fall and dyspnea and is admitted for hypertensive emergency and asthma exacerbation.  Hypertensive emergency Transaminitis In the ED, pressures as high as 214/187 with some control from losartan  and metoprolol  succinate.  His transaminitis may be suggestive of endorgan damage, it appears to  be acute, although could not rule out element of alcoholic hepatitis as well.  I suspect this is due to his intermittent dosing of pill medicines and it is possible his pressure has been elevated  for some time.  Would not want to reduce his blood pressure beyond 160/100 until reevaluation tomorrow. -Resume home medicines losartan  25 and metoprolol  succinate 100 daily -Recheck liver enzymes tomorrow -Consider combo therapy at discharge for better compliance  Acute asthma exacerbation Likely underlying COPD No overt distress or hypoxia but wheezing persists despite multiple DuoNeb treatments and solumedrol from the ER.  He has 3 weeks of progressive shortness of breath as well.  Fortunately he remains on room air. I suspect the cause of this is season allergies. He has a chronic cough without change in sputum. Would likely benefit from PFTs and consider pulmonology outpatient. -DuoNebs -Peak flow -Home inhaler -Continue steroids for 5-day total course-resume home inhalers  Alcohol use disorder Reports an intake of 40 ounces of beer daily with additional liquor consumption.  Last drink was yesterday, so he is now approximately 24 hours without a drink.  Denies severe withdrawal in the past.  He has tremors of the arms and reports jitteriness, could be due to alcohol or also a physiologic tremor, DuoNeb treatment.  Expressed his desire to reduce his intake and so can discuss these strategies later in his hospital course. - CIWA without Ativan  - Will supplement vitamins - Discussed quitting strategies at later date  Recurrent falls A 84-month history of falls at home.  Although he does not have any acute injuries today his chest x-ray shows evidence of subacute rib fractures.  His history is very consistent with orthostatic hypotension, dizziness upon standing, although I doubt he is taking his antihypertensives regularly. - Orthostatic vitals - Caution with home up antihypertensives, must treat  acute hypertension but avoid overtreatment -  PT OT to eval  Afib on anticoagulation Rate and rhythm are controlled. Resume metoprolol  and Eliquis  for anticoagulation.  Diet: Normal VTE: DOAC IVF: None,None Code: Full  Dispo: Admit patient to Observation with expected length of stay less than 2 midnights.  Signed: Carleen Chary, DO Internal Medicine Resident PGY-1  12/16/2023, 4:40 PM

## 2023-12-16 NOTE — ED Provider Notes (Signed)
 [place] EMERGENCY DEPARTMENT AT [facility] Provider Note   CSN: [id number] Arrival date & time: 12/16/23  4010     History  Chief Complaint  Patient presents with   Shortness of Breath    Mark [name] is a 73 y.o. male.  HPI 73 year old male presents with shortness of breath.  He reports a history of asthma and states that it feels like that.  He has been having a cough for a while but has been feeling short of breath for at least a week.  The cough has clear sputum.  Patient states he is a current smoker with about 2 cigarettes/day.  No fevers, chest pain, leg swelling or unilateral leg swelling.  EMS provider reports that the patient was short of breath but not hypoxic on their arrival.  He was given 5 mg albuterol  and 0.5 mg Atrovent  and has had some improvement.  Patient does also feel improved but still not back to baseline.  Home Medications Prior to Admission medications   Medication Sig Start Date End Date Taking? Authorizing Provider  acetaminophen  (TYLENOL ) 500 MG tablet Take 2 tablets (1,000 mg total) by mouth every 6 (six) hours as needed for mild pain (pain score 1-3) (or Fever >/= 101). 05/08/23   Marlin Simmonds, PA-C  albuterol  (PROVENTIL  HFA;VENTOLIN  HFA) 108 (90 Base) MCG/ACT inhaler Inhale 2 puffs into the lungs every 4 (four) hours as needed for wheezing or shortness of breath. 07/03/16   Horton, Vonzella Guernsey, MD  allopurinol (ZYLOPRIM) 100 MG tablet Take 50 mg by mouth daily. 05/14/23   [provider]  apixaban  (ELIQUIS ) 5 MG TABS tablet Take 1 tablet (5 mg total) by mouth 2 (two) times daily. 03/23/22 05/06/23  Bary Boss, DO  atorvastatin  (LIPITOR) 20 MG tablet Take 20 mg by mouth daily. 01/03/22   [provider]  atorvastatin  (LIPITOR) 40 MG tablet Take 40 mg by mouth at bedtime. 05/14/23   [provider]  benzonatate  (TESSALON ) 100 MG capsule Take 1 capsule (100 mg total) by mouth every 8 (eight) hours. 07/23/22    Coretha Dew, PA-C  carvedilol  (COREG ) 12.5 MG tablet Take 1 tablet (12.5 mg total) by mouth 2 (two) times daily with a meal. 05/08/23 12/16/23  Lesa Rape, MD  docusate sodium  (COLACE) 100 MG capsule Take 100 mg by mouth 2 (two) times daily as needed. 05/04/23   [provider]  furosemide  (LASIX ) 20 MG tablet Take 1 tablet (20 mg total) by mouth daily. 03/24/22 12/16/23  Bary Boss, DO  hydrOXYzine  (ATARAX ) 10 MG tablet Take 10-20 mg by mouth at bedtime. 02/19/22   [provider]  losartan  (COZAAR ) 25 MG tablet Take 25 mg by mouth every morning. 01/31/23   [provider]  metoprolol  succinate (TOPROL -XL) 100 MG 24 hr tablet Take 100 mg by mouth daily. 05/20/23   [provider]  Multiple Vitamin (MULTIVITAMIN WITH MINERALS) TABS tablet Take 1 tablet by mouth daily.    [provider]  nicotine  (NICODERM CQ  - DOSED IN MG/24 HR) 7 mg/24hr patch Place 1 patch (7 mg total) onto the skin daily. 03/24/22   Bary Boss, DO  omeprazole  (PRILOSEC) 20 MG capsule Take 1 capsule (20 mg total) by mouth daily. Patient taking differently: Take 20 mg by mouth daily as needed (acid reflux). 10/28/19   Tegeler, Marine Sia, MD  ondansetron  (ZOFRAN ) 4 MG tablet Take 1 tablet (4 mg total) by mouth every 8 (eight) hours as needed for  nausea or vomiting. 09/05/23   Tonya Fredrickson, MD  polyethylene glycol (MIRALAX ) 17 g packet Take 17 g by mouth daily. 06/22/23   Carin Charleston, MD  predniSONE  (DELTASONE ) 20 MG tablet Take 2 tablets (40 mg total) by mouth daily. 07/29/23   Horton, Vonzella Guernsey, MD  senna-docusate (SENOKOT-S) 8.6-50 MG tablet Take 1 tablet by mouth at bedtime as needed for moderate constipation. 06/22/23   Carin Charleston, MD  SYMBICORT 160-4.5 MCG/ACT inhaler Inhale 2 puffs into the lungs daily.  10/14/19   [provider]  traMADol  (ULTRAM ) 50 MG tablet Take 1 tablet (50 mg total) by mouth every 6 (six) hours as needed. 05/08/23 05/07/24  Marlin Simmonds, PA-C  TRELEGY ELLIPTA 200-62.5-25 MCG/ACT AEPB Inhale 1 puff into the lungs daily. 02/20/22   [provider]      Allergies    Patient has no known allergies.    Review of Systems   Review of Systems  Constitutional:  Negative for fever.  Respiratory:  Positive for cough and shortness of breath.   Cardiovascular:  Negative for chest pain and leg swelling.    Physical Exam Updated Vital Signs BP (!) 194/103   Pulse 87   Temp 98.8 F (37.1 C) (Oral)   Resp 20   SpO2 97%  Physical Exam Vitals and nursing note reviewed.  Constitutional:      General: He is not in acute distress.    Appearance: He is well-developed. He is not ill-appearing or diaphoretic.  HENT:     Head: Normocephalic and atraumatic.  Cardiovascular:     Rate and Rhythm: Normal rate and regular rhythm.     Heart sounds: Normal heart sounds.  Pulmonary:     Effort: Pulmonary effort is normal. No accessory muscle usage or respiratory distress.     Breath sounds: Wheezing present.  Abdominal:     Palpations: Abdomen is soft.     Tenderness: There is no abdominal tenderness.  Musculoskeletal:     Right lower leg: No edema.     Left lower leg: No edema.  Skin:    General: Skin is warm and dry.  Neurological:     Mental Status: He is alert.     ED Results / Procedures / Treatments   Labs (all labs ordered are listed, but only abnormal results are displayed) Labs Reviewed  COMPREHENSIVE METABOLIC PANEL WITH GFR - Abnormal; Notable for the following components:      Result Value   Potassium 3.1 (*)    Glucose, Bld 142 (*)    Calcium  8.7 (*)    AST 129 (*)    ALT 84 (*)    Total Bilirubin 1.9 (*)    All other components within normal limits  CBC WITH DIFFERENTIAL/PLATELET - Abnormal; Notable for the following components:   RBC 3.82 (*)    Hemoglobin 12.7 (*)    HCT 37.3 (*)    Platelets 115 (*)    All other components within normal limits  RESP PANEL BY RT-PCR (RSV, FLU A&B,  COVID)  RVPGX2  BRAIN NATRIURETIC PEPTIDE  TROPONIN I (HIGH SENSITIVITY)  TROPONIN I (HIGH SENSITIVITY)    EKG EKG Interpretation Date/Time:  Monday December 16 2023 09:03:04 EDT Ventricular Rate:  104 PR Interval:  158 QRS Duration:  109 QT Interval:  352 QTC Calculation: 461 R Axis:   -41  Text Interpretation: Sinus tachycardia Atrial premature complex Left axis deviation Probable anteroseptal infarct, old Nonspecific T abnormalities, lateral leads  no significant change since Jan 2025 Confirmed by Jerilynn Montenegro [id number]) on 12/16/2023 9:05:16 AM  Radiology DG Chest Portable 1 View Result Date: 12/16/2023 CLINICAL DATA:  Cough and shortness of breath x1 week. EXAM: PORTABLE CHEST 1 VIEW COMPARISON:  None Available. FINDINGS: The heart size and mediastinal contours are within normal limits. There is mild right apical scarring and/or atelectasis. No acute infiltrate, pleural effusion or pneumothorax is identified. Lateral sixth and seventh left rib fractures are seen. This is of indeterminate age and represents a new finding when compared to the prior study. IMPRESSION: 1. Mild right apical scarring and/or atelectasis. 2. Lateral sixth and seventh left rib fractures of indeterminate age. Electronically Signed   By: Virgle Grime M.D.   On: 12/16/2023 09:20    Procedures Procedures    Medications Ordered in ED Medications  ipratropium-albuterol  (DUONEB) 0.5-2.5 (3) MG/3ML nebulizer solution 3 mL (has no administration in time range)  albuterol  (PROVENTIL ) (2.5 MG/3ML) 0.083% nebulizer solution 2.5 mg (has no administration in time range)  methylPREDNISolone  sodium succinate (SOLU-MEDROL ) 125 mg/2 mL injection 125 mg (125 mg Intravenous Given 12/16/23 1027)  ipratropium-albuterol  (DUONEB) 0.5-2.5 (3) MG/3ML nebulizer solution 3 mL (3 mLs Nebulization Given 12/16/23 1027)  albuterol  (PROVENTIL ) (2.5 MG/3ML) 0.083% nebulizer solution 2.5 mg (2.5 mg Nebulization Given 12/16/23 1027)  potassium  chloride SA (KLOR-CON  M) CR tablet 40 mEq (40 mEq Oral Given 12/16/23 1101)  losartan  (COZAAR ) tablet 25 mg (25 mg Oral Given 12/16/23 1427)  metoprolol  succinate (TOPROL -XL) 24 hr tablet 100 mg (100 mg Oral Given 12/16/23 1426)    ED Course/ Medical Decision Making/ A&P                                 Medical Decision Making Amount and/or Complexity of Data Reviewed Labs: ordered.    Details: Mild hypokalemia Radiology: ordered and independent interpretation performed.    Details: No pneumonia.  Patient reports the rib fractures are old. ECG/medicine tests: ordered and independent interpretation performed.    Details: No ischemia  Risk Prescription drug management. Decision regarding hospitalization.   Patient presents with what seems like an asthma/COPD exacerbation.  He is not in distress but is still wheezing on multiple reevaluations.  Has received multiple neb treatments.  Does not need BiPAP or intubation.  He was given some Solu-Medrol .  Overall, I think he will need admission for continued respiratory support.  He is also hypertensive though it seems like he has not been taking his hypertension medicines.  He will be started on oral meds here as he is not having any signs or symptoms of endorgan damage. Admit to internal medicine service.        Final Clinical Impression(s) / ED Diagnoses Final diagnoses:  COPD exacerbation [facility])    Rx / DC Orders ED Discharge Orders     None         Jerilynn Montenegro, MD 12/16/23 1527

## 2023-12-16 NOTE — Hospital Course (Addendum)
 Hypertensive emergency Transaminitis At admission, blood pressure over 200 systolic with transaminitis.  Symptomatic with some mild dizziness and lightheadedness.  For treatment he was resumed on his home metoprolol  and losartan .  He had missed his medicines, only taking medicines once or twice weekly.  Thereafter his blood pressure improved dramatically, approximately 120 systolic with the above regimen.  Transaminitis improved as well. - Discharged with instructions to resume his home losartan  25 daily metoprolol  XL 100 daily  Acute asthma exacerbation Likely underlying COPD Mild exacerbation.  His complaint was progressive dyspnea of 3 weeks duration with increased use of his albuterol .  However has not had any respiratory distress or hypoxia.  He was treated with DuoNebs and 2 days of steroids, methylprednisolone  and prednisone .  His entire ED and hospital stay he was without the need of oxygen.  He had very mild expiratory wheezes on exam.  Likely cause of his exacerbation was seasonal allergies.  For this he will be discharged with new medicine, loratadine  10 mg daily. -resume home inhaler Symbicort 2 puffs daily and albuterol  prn. -Would likely benefit from PFTs and consider pulmonology outpatient -Smoking cessation to be pursued outpatient   Alcohol use disorder Reports an intake of 40 ounces of beer daily with additional liquor consumption.  Last drink was Sunday before admission and no history of challenging withdrawals. CIWAs 4 while hospitalized due exclusively to tremors of the hands which improved.  - CIWA monitored without need of ativan  - Supplemented vitamins - Discussed quitting strategies, consider acamprosate and community support resources at post-hospital fu   Recurrent falls A 57-month history of falls at home.  Although he does not have any acute injuries this hospitalization his chest x-ray showed evidence of subacute rib fractures.  His history is very consistent with  orthostatic hypotension, dizziness upon standing, but orthostatics negative in hospital. - might be deconditioning vs essential tremor, metoprolol  better for tremor than propranolol given his asthma - Consider repeat orthostatics in the outpatient setting -  He will start home PT   Afib on anticoagulation Rate and rhythm were controlled. Resumed metoprolol  and Eliquis  for anticoagulation.

## 2023-12-16 NOTE — Progress Notes (Signed)
 Pt Peak flow was 100 ml with good effort x3

## 2023-12-16 NOTE — ED Triage Notes (Signed)
 PT BIB EMS from home for shortness of breath for 2 days, 92% RA   CBG 59 D10 100 ml Albuterol  neb Atro-vent neb

## 2023-12-16 NOTE — ED Notes (Signed)
 NT went into pt room to assist with ambulating pt wanted to wait NT told pt will come back before 3p NT went back in at 2:40 pt stated he wanted to wait until later to ambulate.

## 2023-12-17 ENCOUNTER — Other Ambulatory Visit (HOSPITAL_COMMUNITY): Payer: Self-pay

## 2023-12-17 DIAGNOSIS — R296 Repeated falls: Secondary | ICD-10-CM

## 2023-12-17 DIAGNOSIS — J45901 Unspecified asthma with (acute) exacerbation: Secondary | ICD-10-CM

## 2023-12-17 DIAGNOSIS — I161 Hypertensive emergency: Principal | ICD-10-CM

## 2023-12-17 LAB — HEPATITIS PANEL, ACUTE
HCV Ab: NONREACTIVE
Hep A IgM: NONREACTIVE
Hep B C IgM: NONREACTIVE
Hepatitis B Surface Ag: NONREACTIVE

## 2023-12-17 LAB — CBC
HCT: 43.6 % (ref 39.0–52.0)
Hemoglobin: 15.1 g/dL (ref 13.0–17.0)
MCH: 32.9 pg (ref 26.0–34.0)
MCHC: 34.6 g/dL (ref 30.0–36.0)
MCV: 95 fL (ref 80.0–100.0)
Platelets: 138 10*3/uL — ABNORMAL LOW (ref 150–400)
RBC: 4.59 MIL/uL (ref 4.22–5.81)
RDW: 12.3 % (ref 11.5–15.5)
WBC: 4.6 10*3/uL (ref 4.0–10.5)
nRBC: 0 % (ref 0.0–0.2)

## 2023-12-17 LAB — COMPREHENSIVE METABOLIC PANEL WITH GFR
ALT: 77 U/L — ABNORMAL HIGH (ref 0–44)
AST: 82 U/L — ABNORMAL HIGH (ref 15–41)
Albumin: 3.7 g/dL (ref 3.5–5.0)
Alkaline Phosphatase: 81 U/L (ref 38–126)
Anion gap: 15 (ref 5–15)
BUN: 9 mg/dL (ref 8–23)
CO2: 21 mmol/L — ABNORMAL LOW (ref 22–32)
Calcium: 9.7 mg/dL (ref 8.9–10.3)
Chloride: 97 mmol/L — ABNORMAL LOW (ref 98–111)
Creatinine, Ser: 0.93 mg/dL (ref 0.61–1.24)
GFR, Estimated: >60 mL/min (ref >60)
Glucose, Bld: 144 mg/dL — ABNORMAL HIGH (ref 70–99)
Potassium: 4 mmol/L (ref 3.5–5.1)
Sodium: 133 mmol/L — ABNORMAL LOW (ref 135–145)
Total Bilirubin: 1.4 mg/dL — ABNORMAL HIGH (ref 0.0–1.2)
Total Protein: 8.1 g/dL (ref 6.5–8.1)

## 2023-12-17 LAB — MAGNESIUM: Magnesium: 1.7 mg/dL (ref 1.7–2.4)

## 2023-12-17 LAB — GLUCOSE, CAPILLARY: Glucose-Capillary: 131 mg/dL — ABNORMAL HIGH (ref 70–99)

## 2023-12-17 MED ORDER — APIXABAN 5 MG PO TABS
5.0000 mg | ORAL_TABLET | Freq: Two times a day (BID) | ORAL | 0 refills | Status: DC
  Filled 2023-12-17: qty 60, 30d supply, fill #0

## 2023-12-17 MED ORDER — ATORVASTATIN CALCIUM 40 MG PO TABS
40.0000 mg | ORAL_TABLET | Freq: Every day | ORAL | 0 refills | Status: DC
  Filled 2023-12-17: qty 30, 30d supply, fill #0

## 2023-12-17 MED ORDER — LORATADINE 10 MG PO TABS
10.0000 mg | ORAL_TABLET | Freq: Every day | ORAL | Status: DC
  Administered 2023-12-17: 10 mg via ORAL
  Filled 2023-12-17: qty 1

## 2023-12-17 MED ORDER — METOPROLOL SUCCINATE ER 100 MG PO TB24
100.0000 mg | ORAL_TABLET | Freq: Every day | ORAL | 0 refills | Status: DC
  Filled 2023-12-17: qty 30, 30d supply, fill #0

## 2023-12-17 MED ORDER — THIAMINE HCL 100 MG PO TABS
100.0000 mg | ORAL_TABLET | Freq: Every day | ORAL | 0 refills | Status: DC
  Filled 2023-12-17: qty 3, 3d supply, fill #0

## 2023-12-17 MED ORDER — MAGNESIUM SULFATE 2 GM/50ML IV SOLN
2.0000 g | Freq: Once | INTRAVENOUS | Status: AC
  Administered 2023-12-17: 2 g via INTRAVENOUS
  Filled 2023-12-17: qty 50

## 2023-12-17 MED ORDER — CHLORHEXIDINE GLUCONATE 0.12 % MT SOLN
OROMUCOSAL | Status: AC
  Filled 2023-12-17: qty 15

## 2023-12-17 MED ORDER — LORATADINE 10 MG PO TABS
10.0000 mg | ORAL_TABLET | Freq: Every day | ORAL | 0 refills | Status: DC
  Filled 2023-12-17: qty 30, 30d supply, fill #0

## 2023-12-17 MED ORDER — LOSARTAN POTASSIUM 25 MG PO TABS
25.0000 mg | ORAL_TABLET | Freq: Every morning | ORAL | 0 refills | Status: DC
  Filled 2023-12-17: qty 30, 30d supply, fill #0

## 2023-12-17 NOTE — Progress Notes (Signed)
 HD#0 SUBJECTIVE:  Patient Summary: Mark [name] is a 73 y.o. with a pertinent PMH of asthma, COPD, Ethanol abuse, tobacco abuse, a.fib on Eliquis , HFrEF EF 30-35% , who presented with a fall and admitted for HTN emergency.   Overnight Events and Interim History: NEO. Slept well. No complaints this morning. Denies dyspnea, headache, CP, pain, anxiety, tremor improved. Would like to bathe.  OBJECTIVE:  Vital Signs: Vitals:   12/17/23 0340 12/17/23 0733 12/17/23 0913 12/17/23 1140  BP: (!) 180/100 (!) 155/129  121/87  Pulse: 76 77  100  Resp: 17     Temp: 98 F (36.7 C) 98 F (36.7 C)    TempSrc: Oral     SpO2: 98% 100% 97%    Supplemental O2: Room Air SpO2: 97 %  There were no vitals filed for this visit.   Intake/Output Summary (Last 24 hours) at 12/17/2023 1202 Last data filed at 12/17/2023 0300 Gross per 24 hour  Intake 120 ml  Output 1600 ml  Net -1480 ml   Net IO Since Admission: -1,480 mL [12/17/23 1202]  Physical Exam: Physical Exam Constitutional:      General: He is not in acute distress.    Appearance: He is not ill-appearing.  Cardiovascular:     Rate and Rhythm: Normal rate and regular rhythm.  Pulmonary:     Effort: Pulmonary effort is normal.     Breath sounds: Wheezing present.     Comments: Mild expiratory wheezes diffuse Musculoskeletal:     Right lower leg: No edema.     Left lower leg: No edema.  Skin:    General: Skin is warm and dry.  Neurological:     Mental Status: He is alert.  Psychiatric:        Mood and Affect: Mood normal.        Behavior: Behavior normal.     Patient Lines/Drains/Airways Status     Active Line/Drains/Airways     Name Placement date Placement time Site Days   Peripheral IV 12/16/23 18 G 1" Anterior;Left Forearm 12/16/23  --  Forearm  1   External Urinary Catheter 12/16/23  1730  --  1             ASSESSMENT/PLAN:  Assessment: Principal Problem:   Hypertensive emergency  Mark [name] is a  73 y.o. male with pertinent PMH of asthma, COPD, Ethanol abuse, tobacco abuse, a.fib on Eliquis , HFrEF EF 30-35% who presented with a fall and dyspnea and is admitted for hypertensive emergency and asthma exacerbation.   Plan: Hypertensive emergency Transaminitis Improving. Blood pressure well within goal range having resumed his home medicines - losartan  25 and metoprolol  100. Had planned to further increase but likely not necessary. Transaminates improving as well. May be able to go home soon, will reevaluate this afternoon. Will encourage he takes his meds. - continue losartan  25 and metoprolol  100  Acute asthma exacerbation Likely underlying COPD Resolving. Was not severe at admission. Not hypoxic or in distress. Some mild wheezes on exam. Today is day 2 of steroids. Will plan for an anti-allergy medicine at discharge as seasonal allergies likely the trigger. -DuoNebs prn -Continue steroids for 5-day total course (currently prednisone  40 on day 2) -resume home inhaler -Loratadine  10 daily -Would likely benefit from PFTs and consider pulmonology outpatient  Alcohol use disorder Reports an intake of 40 ounces of beer daily with additional liquor consumption.  Last drink was Sunday and no history of challenging withdrawals. CIWAs 4 due exclusively to  tremor which is improving. Expressed his desire to reduce his intake and so can discuss these strategies later in his hospital course. - CIWA without Ativan  - Will supplement vitamins - Discussed quitting strategies at later date, consider acamprosate   Recurrent falls A 21-month history of falls at home.  Although he does not have any acute injuries today his chest x-ray shows evidence of subacute rib fractures.  His history is very consistent with orthostatic hypotension, dizziness upon standing, although I doubt he is taking his antihypertensives regularly. - Orthostatic vitals negative - might be deconditioning vs essential tremor,  metoprolol  better for tremor than propranolol given his asthma - Caution with home up antihypertensives, must avoid overtreatment -  PT OT to eval   Afib on anticoagulation Rate and rhythm are controlled. Resumed metoprolol  and Eliquis  for anticoagulation.  Best Practice: Diet: Regular diet IVF: none VTE: DOAC Code: Full Therapy Recs: pending DISPO: Anticipated discharge 1 day to Home pending blood pressure, PT, ongoing stability.  Signature: Carleen Chary, D.O.  Internal Medicine Resident, PGY-1 [facility]  Pager: # [phone]. 12:02 PM, 12/17/2023

## 2023-12-17 NOTE — Plan of Care (Signed)

## 2023-12-17 NOTE — Care Management Obs Status (Signed)
 MEDICARE OBSERVATION STATUS NOTIFICATION   Patient Details  Name: Garlan Drewes MRN: [id number] Date of Birth: 01/19/1951   Medicare Observation Status Notification Given:  Yes    Reynald Woods 12/17/2023, 8:02 AM

## 2023-12-17 NOTE — TOC Transition Note (Signed)
 Transition of Care [facility]) - Discharge Note   Patient Details  Name: Mark [name] MRN: [id number] Date of Birth: 1951-05-10  Transition of Care [facility]) CM/SW Contact:  Omie Bickers, RN Phone Number: 12/17/2023, 3:28 PM   Clinical Narrative:     Spoke w patient at bedside. Discussed support after DC and he would like HH services, no preference for provider and [facility] able to accept referral.    Final next level of care: Home w Home Health Services Barriers to Discharge: No Barriers Identified   Patient Goals and CMS Choice Patient states their goals for this hospitalization and ongoing recovery are:: to go home CMS Medicare.gov Compare Post Acute Care list provided to:: Patient Choice offered to / list presented to : Patient      Discharge Placement                       Discharge Plan and Services Additional resources added to the After Visit Summary for                            [facility] Arranged: PT, OT HH Agency: [facility] Date [facility] Agency Contacted: 12/17/23 Time HH Agency Contacted: 1528 Representative spoke with at [facility] Agency: Alejandra Amos  Social Drivers of Health (SDOH) Interventions SDOH Screenings   Food Insecurity: Food Insecurity Present (12/16/2023)  Housing: Low Risk  (12/16/2023)  Transportation Needs: Unmet Transportation Needs (12/16/2023)  Utilities: At Risk (12/16/2023)  Social Connections: Patient Declined (12/16/2023)  Tobacco Use: High Risk (12/16/2023)     Readmission Risk Interventions    05/07/2023   10:55 AM  Readmission Risk Prevention Plan  Transportation Screening Complete  PCP or Specialist Appt within 5-7 Days Complete  Home Care Screening Complete  Medication Review (RN CM) Complete

## 2023-12-17 NOTE — Evaluation (Signed)
 Occupational Therapy Evaluation Patient Details Name: Mark [name] MRN: [id number] DOB: 02-Jul-1951 Today's Date: 12/17/2023   History of Present Illness   Pt is a 73 y/o M presenting to ED on 6/9 with SOB x3 weeks, lightheadedness, falls, BP in ED 214/187, admitted for hypertensive urgency. 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     Clinical Impressions Pt reports ind at baseline with ADLs, uses rollator for mobility, has a friend that comes over to assist with cleaning/meals but pt unable to specify how frequently. Pt reports 2 falls in the past month due to dizziness. Pt currently needing min-max A for ADLs, supervision for bed mobility and CGA for pivot transfer with RW. Pt denies dizziness this session and declines further ambulation at this time. BP WNL (see below). Pt noted to have bil UE tremors, difficulty with [facility] for self feeding. Pt presenting with impairments listed below, will follow acutely. Recommend HHOT at d/c.   BP supine 134/85 (101) BP seated 142/79 (90) BP standing 137/86 (101)      If plan is discharge home, recommend the following:   A little help with walking and/or transfers;A little help with bathing/dressing/bathroom;Assistance with cooking/housework;Direct supervision/assist for medications management;Direct supervision/assist for financial management;Assist for transportation;Help with stairs or ramp for entrance     Functional Status Assessment   Patient has had a recent decline in their functional status and demonstrates the ability to make significant improvements in function in a reasonable and predictable amount of time.     Equipment Recommendations   None recommended by OT     Recommendations for Other Services   PT consult     Precautions/Restrictions   Precautions Precautions: Fall Restrictions Weight Bearing Restrictions Per Provider Order: No     Mobility Bed  Mobility Overal bed mobility: Needs Assistance Bed Mobility: Supine to Sit     Supine to sit: Supervision          Transfers Overall transfer level: Needs assistance Equipment used: Rolling walker (2 wheels) Transfers: Sit to/from Stand, Bed to chair/wheelchair/BSC Sit to Stand: Contact guard assist Stand pivot transfers: Contact guard assist                Balance Overall balance assessment: Needs assistance Sitting-balance support: Feet supported Sitting balance-Leahy Scale: Good     Standing balance support: During functional activity, Reliant on assistive device for balance Standing balance-Leahy Scale: Fair Standing balance comment: static stand without AD, RW for pivot trasnfer                           ADL either performed or assessed with clinical judgement   ADL Overall ADL's : Needs assistance/impaired Eating/Feeding: Sitting;Minimal assistance Eating/Feeding Details (indicate cue type and reason): min A for fine motor aspects of task Grooming: Minimal assistance;Sitting;Standing   Upper Body Bathing: Minimal assistance;Sitting   Lower Body Bathing: Minimal assistance;Sitting/lateral leans   Upper Body Dressing : Minimal assistance;Sitting   Lower Body Dressing: Minimal assistance;Sitting/lateral leans;Sit to/from stand;Moderate assistance Lower Body Dressing Details (indicate cue type and reason): reaches down to pull up socks, R easier than L Toilet Transfer: Contact guard assist;Rolling walker (2 wheels);Stand-pivot;BSC/3in1 Statistician Details (indicate cue type and reason): simulated to chair Toileting- Clothing Manipulation and Hygiene: Maximal assistance Toileting - Clothing Manipulation Details (indicate cue type and reason): catheter     Functional mobility during ADLs: Contact guard assist;Rolling walker (2 wheels)  Vision   Vision Assessment?: No apparent visual deficits     Perception Perception: Not tested        Praxis Praxis: Not tested       Pertinent Vitals/Pain Pain Assessment Pain Assessment: No/denies pain     Extremity/Trunk Assessment Upper Extremity Assessment Upper Extremity Assessment: Generalized weakness (BUE tremoring, decr coordination)   Lower Extremity Assessment Lower Extremity Assessment: Defer to PT evaluation   Cervical / Trunk Assessment Cervical / Trunk Assessment: Normal   Communication Communication Communication: No apparent difficulties   Cognition Arousal: Alert Behavior During Therapy: Flat affect Cognition: No family/caregiver present to determine baseline             OT - Cognition Comments: states he is at the hospital for a "fall" and for his "asthma" endorses numerous 2 falls in the past month due to becoming lightheaded                 Following commands: Intact       Cueing  General Comments   Cueing Techniques: Verbal cues  VSS; see note for BP measures   Exercises     Shoulder Instructions      Home Living Family/patient expects to be discharged to:: Private residence Living Arrangements: Alone Available Help at Discharge: Friend(s);Available PRN/intermittently (friend "cleans up" once a week) Type of Home: Apartment Home Access: Stairs to enter Entrance Stairs-Number of Steps: 2 Entrance Stairs-Rails: None Home Layout: One level     Bathroom Shower/Tub: Tub/shower unit         Home Equipment: Agricultural consultant (2 wheels);Rollator (4 wheels)          Prior Functioning/Environment Prior Level of Function : Needs assist             Mobility Comments: uses rollator at all times ADLs Comments: sponge bathes because his shower is "rusty", uses bus for transportation, has friends that come over and cook for him, does own medications    OT Problem List: Decreased strength;Decreased range of motion;Decreased activity tolerance;Impaired balance (sitting and/or standing);Decreased safety awareness;Decreased  cognition   OT Treatment/Interventions: Self-care/ADL training;Therapeutic exercise;DME and/or AE instruction;Energy conservation;Therapeutic activities;Patient/family education;Balance training      OT Goals(Current goals can be found in the care plan section)   Acute Rehab OT Goals Patient Stated Goal: none stated OT Goal Formulation: With patient Time For Goal Achievement: 12/31/23 Potential to Achieve Goals: Good ADL Goals Pt Will Perform Grooming: with supervision;standing Pt Will Perform Upper Body Dressing: with supervision;sitting Pt Will Perform Lower Body Dressing: with supervision;sit to/from stand;sitting/lateral leans Pt Will Transfer to Toilet: with supervision;ambulating;regular height toilet   OT Frequency:  Min 2X/week    Co-evaluation              AM-PAC OT "6 Clicks" Daily Activity     Outcome Measure Help from another person eating meals?: A Little Help from another person taking care of personal grooming?: A Little Help from another person toileting, which includes using toliet, bedpan, or urinal?: A Lot Help from another person bathing (including washing, rinsing, drying)?: A Little Help from another person to put on and taking off regular upper body clothing?: A Little Help from another person to put on and taking off regular lower body clothing?: A Lot 6 Click Score: 16   End of Session Equipment Utilized During Treatment: Gait belt;Rolling walker (2 wheels) Nurse Communication: Mobility status  Activity Tolerance: Patient tolerated treatment well Patient left: in chair;with call bell/phone within reach;with chair  alarm set  OT Visit Diagnosis: Unsteadiness on feet (R26.81);Other abnormalities of gait and mobility (R26.89);Muscle weakness (generalized) (M62.81)                Time: 1610-9604 OT Time Calculation (min): 24 min Charges:  OT General Charges $OT Visit: 1 Visit OT Evaluation $OT Eval Low Complexity: 1 Low OT Treatments $Self  Care/Home Management : 8-22 mins  Zoiey Christy K, OTD, OTR/L SecureChat Preferred Acute Rehab [phone]   Worley Radermacher K Koonce 12/17/2023, 10:15 AM

## 2023-12-17 NOTE — Progress Notes (Addendum)
   12/17/23 0020  Provider Notification  Provider Name/Title Alexander-Savino  Date Provider Notified 12/17/23  Time Provider Notified 0020  Method of Notification  (secure chat)  Notification Reason Other (Comment) (BP 181/121, hr 69, ciwa-4)  Provider response No new orders (MD ok if sbp is under 190, advised to continue to monitor HR, dizziness and palpitations)  Date of Provider Response 12/17/23  Time of Provider Response 0030   Patient reported, no dizziness, no headaches and no palpitations. Will continue to monitor.

## 2023-12-17 NOTE — Discharge Summary (Incomplete)
 Name: Mark [name] MRN: [id number] DOB: [birth year] 73 y.o. PCP: Maryellen Snare, NP  Date of Admission: 12/16/2023  8:50 AM Date of Discharge: 12/17/2023 12/17/23 Attending Physician: Dr. Ancil Balzarine  DISCHARGE DIAGNOSIS:  Primary Problem: Hypertensive emergency   Hospital Problems: Principal Problem:   Hypertensive emergency Active Problems:   Recurrent falls   Asthma with acute exacerbation    DISCHARGE MEDICATIONS:   Allergies as of 12/17/2023   No Known Allergies      Medication List     PAUSE taking these medications    furosemide  20 MG tablet Wait to take this until your doctor or other care provider tells you to start again. Commonly known as: LASIX  Take 1 tablet (20 mg total) by mouth daily.       STOP taking these medications    allopurinol 100 MG tablet Commonly known as: ZYLOPRIM   benzonatate  100 MG capsule Commonly known as: TESSALON    carvedilol  12.5 MG tablet Commonly known as: COREG    docusate sodium  100 MG capsule Commonly known as: COLACE   hydrOXYzine  10 MG tablet Commonly known as: ATARAX    nicotine  7 mg/24hr patch Commonly known as: NICODERM CQ  - dosed in mg/24 hr   omeprazole  20 MG capsule Commonly known as: PRILOSEC   ondansetron  4 MG tablet Commonly known as: ZOFRAN    predniSONE  20 MG tablet Commonly known as: DELTASONE    traMADol  50 MG tablet Commonly known as: Ultram    Trelegy Ellipta 200-62.5-25 MCG/ACT Aepb Generic drug: Fluticasone -Umeclidin-Vilant       TAKE these medications    acetaminophen  500 MG tablet Commonly known as: TYLENOL  Take 2 tablets (1,000 mg total) by mouth every 6 (six) hours as needed for mild pain (pain score 1-3) (or Fever >/= 101).   albuterol  108 (90 Base) MCG/ACT inhaler Commonly known as: VENTOLIN  HFA Inhale 2 puffs into the lungs every 4 (four) hours as needed for wheezing or shortness of breath.   atorvastatin  40 MG tablet Commonly known as: LIPITOR Take 1 tablet (40 mg total) by  mouth at bedtime. What changed: Another medication with the same name was removed. Continue taking this medication, and follow the directions you see here.   Eliquis  5 MG Tabs tablet Generic drug: apixaban  Take 1 tablet (5 mg total) by mouth 2 (two) times daily.   loratadine  10 MG tablet Commonly known as: CLARITIN  Take 1 tablet (10 mg total) by mouth daily.   losartan  25 MG tablet Commonly known as: COZAAR  Take 1 tablet (25 mg total) by mouth every morning.   metoprolol  succinate 100 MG 24 hr tablet Commonly known as: TOPROL -XL Take 1 tablet (100 mg total) by mouth daily.   multivitamin with minerals Tabs tablet Take 1 tablet by mouth daily.   omeprazole  20 MG tablet Commonly known as: PRILOSEC OTC Take 20 mg by mouth daily as needed (for reflux or heartburn).   polyethylene glycol 17 g packet Commonly known as: MiraLax  Take 17 g by mouth daily.   senna-docusate 8.6-50 MG tablet Commonly known as: Senokot-S Take 1 tablet by mouth at bedtime as needed for moderate constipation.   Symbicort 160-4.5 MCG/ACT inhaler Generic drug: budesonide -formoterol  Inhale 2 puffs into the lungs daily.   thiamine  100 MG tablet Commonly known as: VITAMIN B1 Take 1 tablet (100 mg total) by mouth daily.        DISPOSITION AND FOLLOW-UP:  [name] was discharged from [facility] in stable condition. At the hospital follow up visit please address:  Hospitalized  for HTN emergency and mild asthma exacerbation. We also attended to his high alcohol use and his report of frequent falls.  Follow-up Recommendations: Labs: CMP Medications: Resumed home medicines. Re HTN, that was losartan  25 and metoprolol  100.   Consider ref to Pulm vs PFTs. Given smoking, likely he has COPD.  Consider resources for alcohol cessation. He expressed a desire to quit. Medicine may be appropriate as an adjunct. We considered acamprosate but will hold given his irregular medication  habits until he is consistent with his HTN meds.  Regarding his falls, please ensure home health is set up. Consider additional intervention as indicated including assessment for orthostatics and walking aids.  Follow-up Appointments:  Follow-up Information     [facility].   Specialty: Home Health Services Contact information: [address] [place] [address] [place] [address] [phone]               He has set up a visit with his PCP next week.  HOSPITAL COURSE:   Hypertensive emergency Transaminitis At admission, blood pressure over 200 systolic with transaminitis.  Symptomatic with some mild dizziness and lightheadedness.  For treatment he was resumed on his home metoprolol  and losartan .  He had missed his medicines, only taking medicines once or twice weekly.  Thereafter his blood pressure improved dramatically, approximately 120 systolic with the above regimen.  Transaminitis improved as well. - Discharged with instructions to resume his home losartan  25 daily metoprolol  XL 100 daily -Repeat liver enzymes and consider further evaluation if they remain elevated  Acute asthma exacerbation Likely underlying COPD Mild exacerbation.  His complaint was progressive dyspnea of 3 weeks duration with increased use of his albuterol .  However has not had any respiratory distress or hypoxia.  He was treated with DuoNebs and 2 days of steroids, methylprednisolone  and prednisone .  His entire ED and hospital stay he was without the need of oxygen.  He had very mild expiratory wheezes on exam.  Likely cause of his exacerbation was seasonal allergies.  For this he will be discharged with new medicine, loratadine  10 mg daily. -resume home inhaler Symbicort 2 puffs daily and albuterol  prn. -Would likely benefit from PFTs and consider pulmonology outpatient -Smoking cessation to be pursued outpatient   Alcohol use disorder Reports an intake of 40 ounces of beer daily with  additional liquor consumption.  Last drink was Sunday before admission and no history of challenging withdrawals. CIWAs 4 while hospitalized due exclusively to pre-existing tremor of the hands which improved.  - CIWA monitored without need of ativan  - Supplemented vitamins - Discussed quitting strategies, consider acamprosate and community support resources at post-hospital fu   Recurrent falls A 19-month history of falls at home.  Although he does not have any acute injuries this hospitalization his chest x-ray showed evidence of subacute rib fractures.  His history is very consistent with orthostatic hypotension, dizziness upon standing, however orthostatic vital signs negative in hospital and very hypertensive as mentioned above. - might be deconditioning vs essential tremor, metoprolol  better for tremor than propranolol given his asthma - Consider repeat orthostatics in the outpatient setting -  He will start home PT   Afib on anticoagulation Rate and rhythm were controlled. Resumed metoprolol  and Eliquis  for anticoagulation.   DISCHARGE INSTRUCTIONS:   Discharge Instructions     Diet - low sodium heart healthy   Complete by: As directed    Discharge instructions   Complete by: As directed    You  were hospitalized with severe high blood pressure as well as a mild asthma exacerbation.    Your blood pressure was dangerously high but improved greatly with your home medicines.  These are losartan  and metoprolol .  Please take these medicines at home, losartan  25 mg daily and metoprolol  100 mg daily.  If you have concerns about taking his medicines or difficulties affording them, please discuss this with your primary doctor.  Severe blood pressure, like you had at admission, can lead to strokes and heart disease if not treated.  Fortunately your asthma exacerbation was very mild.  We treated you with some inhalers and steroids and responded very well.  Going forward continue to take your  inhalers and also start a medicine called loratadine  or Claritin .  This medicine is for allergies and I believe allergies are what caused your asthma to worsen.  Going home, expect to work with physical therapists who will come to your home and work on your strength and balance.  This will be very helpful for preventing falls.  I encourage you to talk about your falls with your primary care doctor as well.  Finally, one of the best things you can do for your health is to reduce the amount of alcohol that you consume.  Please discuss this with your primary doctor as well.  Give your primary doctor a call and request to set up a posthospitalization visit within the next 1 to 2 weeks.  At that visit it is important to review your medicines and address any other needs that arise after this hospitalization.   Face-to-face encounter (required for Medicare/Medicaid patients)   Complete by: As directed    I Malen Scudder certify that this patient is under my care and that I, or a nurse practitioner or physician's assistant working with me, had a face-to-face encounter that meets the physician face-to-face encounter requirements with this patient on 12/17/2023. The encounter with the patient was in whole, or in part for the following medical condition(s) which is the primary reason for home health care (List medical condition): functional decline including decreased strength;decreased range of motion;decreased activity tolerance;Impaired balance (sitting and/or standing);decreased safety awareness;decreased cognition.   The encounter with the patient was in whole, or in part, for the following medical condition, which is the primary reason for home health care: functional decline   I certify that, based on my findings, the following services are medically necessary home health services: Physical therapy   Reason for Medically Necessary Home Health Services: Other See Comments   My clinical findings support the need  for the above services: OTHER SEE COMMENTS   Further, I certify that my clinical findings support that this patient is homebound due to: Unsafe ambulation due to balance issues   Home Health   Complete by: As directed    To provide the following care/treatments: OT   Increase activity slowly   Complete by: As directed        SUBJECTIVE:  Feels well today. Would like to go home but willing to wait. No pain, dyspnea. Notes his tremor is better. He has already made a PCP appointment in anticipation of leaving.  Discharge Vitals:   BP 121/87 (BP Location: Left Arm)   Pulse 100   Temp 98 F (36.7 C)   Resp 17   SpO2 97%   OBJECTIVE:  Physical Exam Constitutional:      General: He is not in acute distress.    Appearance: He is not ill-appearing.  Cardiovascular:     Rate and Rhythm: Normal rate and regular rhythm.  Pulmonary:     Effort: Pulmonary effort is normal.     Breath sounds: Normal breath sounds.  Musculoskeletal:     Right lower leg: No edema.     Left lower leg: No edema.  Skin:    General: Skin is warm and dry.  Neurological:     General: No focal deficit present.     Mental Status: He is alert and oriented to person, place, and time.  Psychiatric:        Mood and Affect: Mood normal.        Behavior: Behavior normal.     Pertinent Labs, Studies, and Procedures:     Latest Ref Rng & Units 12/17/2023    4:18 AM 12/16/2023    9:27 AM 10/19/2023    2:16 PM  CBC  WBC 4.0 - 10.5 K/uL 4.6  5.9  5.9   Hemoglobin 13.0 - 17.0 g/dL 46.9  62.9  52.8   Hematocrit 39.0 - 52.0 % 43.6  37.3  41.5   Platelets 150 - 400 K/uL 138  115  249        Latest Ref Rng & Units 12/17/2023    4:18 AM 12/16/2023    9:27 AM 10/19/2023    2:16 PM  CMP  Glucose 70 - 99 mg/dL 413  244  90   BUN 8 - 23 mg/dL 9  8  10    Creatinine 0.61 - 1.24 mg/dL 0.10  2.72  5.36   Sodium 135 - 145 mmol/L 133  136  141   Potassium 3.5 - 5.1 mmol/L 4.0  3.1  3.3   Chloride 98 - 111 mmol/L 97  99  102    CO2 22 - 32 mmol/L 21  24  23    Calcium  8.9 - 10.3 mg/dL 9.7  8.7  64.4   Total Protein 6.5 - 8.1 g/dL 8.1  7.1    Total Bilirubin 0.0 - 1.2 mg/dL 1.4  1.9    Alkaline Phos 38 - 126 U/L 81  71    AST 15 - 41 U/L 82  129    ALT 0 - 44 U/L 77  84      DG Chest Portable 1 View Result Date: 12/16/2023 CLINICAL DATA:  Cough and shortness of breath x1 week. EXAM: PORTABLE CHEST 1 VIEW COMPARISON:  None Available. FINDINGS: The heart size and mediastinal contours are within normal limits. There is mild right apical scarring and/or atelectasis. No acute infiltrate, pleural effusion or pneumothorax is identified. Lateral sixth and seventh left rib fractures are seen. This is of indeterminate age and represents a new finding when compared to the prior study. IMPRESSION: 1. Mild right apical scarring and/or atelectasis. 2. Lateral sixth and seventh left rib fractures of indeterminate age. Electronically Signed   By: Virgle Grime M.D.   On: 12/16/2023 09:20     Signed: Carleen Chary, DO Internal Medicine Resident, PGY-1 [facility]  8:54 AM, 12/18/2023

## 2023-12-17 NOTE — Progress Notes (Signed)
 Reviewed AVS, patient expressed understanding of medications, MD follow up reviewed.    Patient states all belongings brought to the hospital at time of admission are accounted for and packed to take home.  Patient informed and expressed understanding to pick up discharge medications from [facility] pharmacy before leaving hospital .   Discharge delayed due to IV medications that need to finish.

## 2023-12-17 NOTE — Evaluation (Addendum)
 Physical Therapy Evaluation Patient Details Name: Mark [name] MRN: [id number] DOB: [birth year] Today's Date: 12/17/2023  History of Present Illness  Pt is a 73 y/o M presenting to ED on 6/9 with SOB x3 weeks, lightheadedness, falls, BP in ED 214/187, admitted for hypertensive urgency. 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  Clinical Impression  Patient presents with mobility close to functional baseline.  Reports he falls at home because "he gets in a hurry".  Notes his rollator is bigger than the one used during session though reports he can get it into the bathroom as he "Folds it up".  Patient stable for home without PT follow up.  Did some safety education encouraged him to take home the urinal to use it instead of jumping up too fast.  PT will sign off.         If plan is discharge home, recommend the following: Help with stairs or ramp for entrance;Assist for transportation;Assistance with cooking/housework   Can travel by private vehicle        Equipment Recommendations None recommended by PT  Recommendations for Other Services       Functional Status Assessment Patient has had a recent decline in their functional status and demonstrates the ability to make significant improvements in function in a reasonable and predictable amount of time.     Precautions / Restrictions Precautions Precautions: Fall      Mobility  Bed Mobility Overal bed mobility: Modified Independent                  Transfers Overall transfer level: Modified independent Equipment used: Rollator (4 wheels)               General transfer comment: stood from EOB unaided, assist for paper scrubs due to too big and falling down under his feet    Ambulation/Gait Ambulation/Gait assistance: Supervision Gait Distance (Feet): 150 Feet Assistive device: Rollator (4 wheels) Gait Pattern/deviations: Step-through pattern, Decreased stride  length       General Gait Details: no LOB and VSS throughout, assist due to pants too big so PT held onto them during ambulation  Stairs            Wheelchair Mobility     Tilt Bed    Modified Rankin (Stroke Patients Only)       Balance Overall balance assessment: Needs assistance   Sitting balance-Leahy Scale: Good     Standing balance support: No upper extremity supported Standing balance-Leahy Scale: Fair                               Pertinent Vitals/Pain Pain Assessment Pain Assessment: No/denies pain    Home Living Family/patient expects to be discharged to:: Private residence Living Arrangements: Alone Available Help at Discharge: Friend(s);Available PRN/intermittently Type of Home: Apartment Home Access: Stairs to enter Entrance Stairs-Rails: None Entrance Stairs-Number of Steps: 2   Home Layout: One level Home Equipment: Agricultural consultant (2 wheels);Rollator (4 wheels)      Prior Function Prior Level of Function : Needs assist             Mobility Comments: uses rollator at all times ADLs Comments: sponge bathes because his shower is "rusty", uses bus for transportation, has friends that come over and cook for him, does own medications     Extremity/Trunk Assessment        Lower Extremity Assessment  Lower Extremity Assessment: Overall WFL for tasks assessed    Cervical / Trunk Assessment Cervical / Trunk Assessment: Normal  Communication   Communication Communication: No apparent difficulties    Cognition Arousal: Alert Behavior During Therapy: WFL for tasks assessed/performed   PT - Cognitive impairments: Safety/Judgement                       PT - Cognition Comments: reports he falls due to going too fast         Cueing Cueing Techniques: Verbal cues     General Comments General comments (skin integrity, edema, etc.): Encouraged him to take home urinal so he does not have to get up to rush to  bathroom in the morning, stated "that's a good idea"    Exercises     Assessment/Plan    PT Assessment Patient does not need any further PT services  PT Problem List         PT Treatment Interventions      PT Goals (Current goals can be found in the Care Plan section)  Acute Rehab PT Goals PT Goal Formulation: All assessment and education complete, DC therapy    Frequency       Co-evaluation               AM-PAC PT "6 Clicks" Mobility  Outcome Measure Help needed turning from your back to your side while in a flat bed without using bedrails?: None Help needed moving from lying on your back to sitting on the side of a flat bed without using bedrails?: None Help needed moving to and from a bed to a chair (including a wheelchair)?: None Help needed standing up from a chair using your arms (e.g., wheelchair or bedside chair)?: None Help needed to walk in hospital room?: None Help needed climbing 3-5 steps with a railing? : A Little 6 Click Score: 23    End of Session   Activity Tolerance: Patient tolerated treatment well Patient left: in bed;with call bell/phone within reach   PT Visit Diagnosis: History of falling (Z91.81)    Time: 1350-1410 PT Time Calculation (min) (ACUTE ONLY): 20 min   Charges:   PT Evaluation $PT Eval Low Complexity: 1 Low   PT General Charges $$ ACUTE PT VISIT: 1 Visit         Abigail Hoff, PT Acute Rehabilitation Services Office:[phone] 12/17/2023   Marley Simmers 12/17/2023, 2:43 PM

## 2024-01-02 ENCOUNTER — Inpatient Hospital Stay (HOSPITAL_COMMUNITY)
Admission: EM | Admit: 2024-01-02 | Discharge: 2024-01-07 | DRG: 683 | Disposition: A | Discharge status: Home Health | Attending: Internal Medicine | Admitting: Internal Medicine

## 2024-01-02 ENCOUNTER — Encounter (HOSPITAL_COMMUNITY): Payer: Self-pay

## 2024-01-02 ENCOUNTER — Emergency Department (HOSPITAL_COMMUNITY)

## 2024-01-02 DIAGNOSIS — E876 Hypokalemia: Secondary | ICD-10-CM | POA: Diagnosis present

## 2024-01-02 DIAGNOSIS — N179 Acute kidney failure, unspecified: Secondary | ICD-10-CM | POA: Diagnosis not present

## 2024-01-02 DIAGNOSIS — E860 Dehydration: Secondary | ICD-10-CM | POA: Diagnosis present

## 2024-01-02 DIAGNOSIS — F10139 Alcohol abuse with withdrawal, unspecified: Secondary | ICD-10-CM | POA: Diagnosis present

## 2024-01-02 DIAGNOSIS — I5042 Chronic combined systolic (congestive) and diastolic (congestive) heart failure: Secondary | ICD-10-CM | POA: Diagnosis present

## 2024-01-02 DIAGNOSIS — Z7951 Long term (current) use of inhaled steroids: Secondary | ICD-10-CM

## 2024-01-02 DIAGNOSIS — I252 Old myocardial infarction: Secondary | ICD-10-CM

## 2024-01-02 DIAGNOSIS — K2289 Other specified disease of esophagus: Secondary | ICD-10-CM | POA: Diagnosis present

## 2024-01-02 DIAGNOSIS — Y900 Blood alcohol level of less than 20 mg/100 ml: Secondary | ICD-10-CM | POA: Diagnosis present

## 2024-01-02 DIAGNOSIS — S2232XA Fracture of one rib, left side, initial encounter for closed fracture: Secondary | ICD-10-CM | POA: Diagnosis present

## 2024-01-02 DIAGNOSIS — Z1152 Encounter for screening for COVID-19: Secondary | ICD-10-CM

## 2024-01-02 DIAGNOSIS — I480 Paroxysmal atrial fibrillation: Secondary | ICD-10-CM | POA: Diagnosis present

## 2024-01-02 DIAGNOSIS — D7589 Other specified diseases of blood and blood-forming organs: Secondary | ICD-10-CM | POA: Diagnosis present

## 2024-01-02 DIAGNOSIS — S8000XA Contusion of unspecified knee, initial encounter: Secondary | ICD-10-CM

## 2024-01-02 DIAGNOSIS — I2510 Atherosclerotic heart disease of native coronary artery without angina pectoris: Secondary | ICD-10-CM | POA: Diagnosis present

## 2024-01-02 DIAGNOSIS — J4489 Other specified chronic obstructive pulmonary disease: Secondary | ICD-10-CM | POA: Diagnosis present

## 2024-01-02 DIAGNOSIS — N400 Enlarged prostate without lower urinary tract symptoms: Secondary | ICD-10-CM | POA: Diagnosis present

## 2024-01-02 DIAGNOSIS — I110 Hypertensive heart disease with heart failure: Secondary | ICD-10-CM | POA: Diagnosis present

## 2024-01-02 DIAGNOSIS — F1010 Alcohol abuse, uncomplicated: Secondary | ICD-10-CM | POA: Diagnosis present

## 2024-01-02 DIAGNOSIS — R296 Repeated falls: Secondary | ICD-10-CM | POA: Diagnosis present

## 2024-01-02 DIAGNOSIS — W19XXXA Unspecified fall, initial encounter: Secondary | ICD-10-CM | POA: Diagnosis present

## 2024-01-02 DIAGNOSIS — S2249XA Multiple fractures of ribs, unspecified side, initial encounter for closed fracture: Secondary | ICD-10-CM | POA: Insufficient documentation

## 2024-01-02 DIAGNOSIS — Z23 Encounter for immunization: Secondary | ICD-10-CM

## 2024-01-02 DIAGNOSIS — Z720 Tobacco use: Secondary | ICD-10-CM | POA: Diagnosis present

## 2024-01-02 DIAGNOSIS — A419 Sepsis, unspecified organism: Secondary | ICD-10-CM | POA: Diagnosis present

## 2024-01-02 DIAGNOSIS — S2231XA Fracture of one rib, right side, initial encounter for closed fracture: Secondary | ICD-10-CM

## 2024-01-02 DIAGNOSIS — E872 Acidosis, unspecified: Secondary | ICD-10-CM | POA: Diagnosis present

## 2024-01-02 DIAGNOSIS — R7989 Other specified abnormal findings of blood chemistry: Secondary | ICD-10-CM | POA: Diagnosis present

## 2024-01-02 DIAGNOSIS — Z800 Family history of malignant neoplasm of digestive organs: Secondary | ICD-10-CM

## 2024-01-02 DIAGNOSIS — Z7901 Long term (current) use of anticoagulants: Secondary | ICD-10-CM

## 2024-01-02 DIAGNOSIS — R262 Difficulty in walking, not elsewhere classified: Secondary | ICD-10-CM

## 2024-01-02 DIAGNOSIS — F17210 Nicotine dependence, cigarettes, uncomplicated: Secondary | ICD-10-CM | POA: Diagnosis present

## 2024-01-02 DIAGNOSIS — J449 Chronic obstructive pulmonary disease, unspecified: Secondary | ICD-10-CM | POA: Diagnosis present

## 2024-01-02 LAB — PROTIME-INR
INR: 1.3 — ABNORMAL HIGH (ref 0.8–1.2)
Prothrombin Time: 17 s — ABNORMAL HIGH (ref 11.4–15.2)

## 2024-01-02 LAB — RESP PANEL BY RT-PCR (RSV, FLU A&B, COVID)  RVPGX2
Influenza A by PCR: NEGATIVE
Influenza B by PCR: NEGATIVE
Resp Syncytial Virus by PCR: NEGATIVE
SARS Coronavirus 2 by RT PCR: NEGATIVE

## 2024-01-02 LAB — COMPREHENSIVE METABOLIC PANEL WITH GFR
ALT: 32 U/L (ref 0–44)
AST: 51 U/L — ABNORMAL HIGH (ref 15–41)
Albumin: 4.2 g/dL (ref 3.5–5.0)
Alkaline Phosphatase: 184 U/L — ABNORMAL HIGH (ref 38–126)
Anion gap: 22 — ABNORMAL HIGH (ref 5–15)
BUN: 16 mg/dL (ref 8–23)
CO2: 21 mmol/L — ABNORMAL LOW (ref 22–32)
Calcium: 9.5 mg/dL (ref 8.9–10.3)
Chloride: 91 mmol/L — ABNORMAL LOW (ref 98–111)
Creatinine, Ser: 2.49 mg/dL — ABNORMAL HIGH (ref 0.61–1.24)
GFR, Estimated: 27 mL/min — ABNORMAL LOW (ref >60)
Glucose, Bld: 157 mg/dL — ABNORMAL HIGH (ref 70–99)
Potassium: 3.5 mmol/L (ref 3.5–5.1)
Sodium: 134 mmol/L — ABNORMAL LOW (ref 135–145)
Total Bilirubin: 2.2 mg/dL — ABNORMAL HIGH (ref 0.0–1.2)
Total Protein: 8.8 g/dL — ABNORMAL HIGH (ref 6.5–8.1)

## 2024-01-02 LAB — I-STAT CG4 LACTIC ACID, ED: Lactic Acid, Venous: 4.8 mmol/L (ref 0.5–1.9)

## 2024-01-02 LAB — CBC
HCT: 49.2 % (ref 39.0–52.0)
Hemoglobin: 16.4 g/dL (ref 13.0–17.0)
MCH: 34.1 pg — ABNORMAL HIGH (ref 26.0–34.0)
MCHC: 33.3 g/dL (ref 30.0–36.0)
MCV: 102.3 fL — ABNORMAL HIGH (ref 80.0–100.0)
Platelets: 332 10*3/uL (ref 150–400)
RBC: 4.81 MIL/uL (ref 4.22–5.81)
RDW: 13.5 % (ref 11.5–15.5)
WBC: 10.1 10*3/uL (ref 4.0–10.5)
nRBC: 0.4 % — ABNORMAL HIGH (ref 0.0–0.2)

## 2024-01-02 LAB — ETHANOL: Alcohol, Ethyl (B): <15 mg/dL (ref <15)

## 2024-01-02 LAB — LIPASE, BLOOD: Lipase: 32 U/L (ref 11–51)

## 2024-01-02 LAB — CK: Total CK: 142 U/L (ref 49–397)

## 2024-01-02 MED ORDER — METRONIDAZOLE 500 MG/100ML IV SOLN
500.0000 mg | Freq: Once | INTRAVENOUS | Status: AC
  Administered 2024-01-02: 500 mg via INTRAVENOUS
  Filled 2024-01-02: qty 100

## 2024-01-02 MED ORDER — ONDANSETRON HCL 4 MG/2ML IJ SOLN
4.0000 mg | Freq: Once | INTRAMUSCULAR | Status: AC
  Administered 2024-01-02: 4 mg via INTRAVENOUS
  Filled 2024-01-02: qty 2

## 2024-01-02 MED ORDER — SODIUM CHLORIDE 0.9 % IV BOLUS (SEPSIS)
1000.0000 mL | Freq: Once | INTRAVENOUS | Status: AC
Start: 2024-01-02 — End: 2024-01-03
  Administered 2024-01-02: 1000 mL via INTRAVENOUS

## 2024-01-02 MED ORDER — VANCOMYCIN HCL IN DEXTROSE 1-5 GM/200ML-% IV SOLN
1000.0000 mg | Freq: Once | INTRAVENOUS | Status: AC
  Administered 2024-01-02: 1000 mg via INTRAVENOUS
  Filled 2024-01-02: qty 200

## 2024-01-02 MED ORDER — LORAZEPAM 2 MG/ML IJ SOLN
1.0000 mg | Freq: Once | INTRAMUSCULAR | Status: AC
  Administered 2024-01-02: 1 mg via INTRAVENOUS
  Filled 2024-01-02: qty 1

## 2024-01-02 MED ORDER — MORPHINE SULFATE (PF) 4 MG/ML IV SOLN
4.0000 mg | Freq: Once | INTRAVENOUS | Status: AC
  Administered 2024-01-02: 4 mg via INTRAVENOUS
  Filled 2024-01-02: qty 1

## 2024-01-02 MED ORDER — SODIUM CHLORIDE 0.9 % IV SOLN
2.0000 g | Freq: Once | INTRAVENOUS | Status: AC
  Administered 2024-01-02: 2 g via INTRAVENOUS
  Filled 2024-01-02: qty 12.5

## 2024-01-02 NOTE — ED Notes (Signed)
 I-Stat lactic acid is 4.8 EDP [place] notified.

## 2024-01-02 NOTE — Sepsis Progress Note (Signed)
 Elink monitoring for the code sepsis protocol.

## 2024-01-02 NOTE — ED Triage Notes (Signed)
 Pt comes via [facility] for bilateral knee pain that has been going on for a day as well as n/v/d. Pt reports vomiting x 2 today and 2 episodes of diarrhea.

## 2024-01-02 NOTE — ED Provider Notes (Signed)
 [place] [facility] AT [facility] Provider Note   CSN: [id number] Arrival date & time: 01/02/24  2040     Patient presents with: Vomiting and Knee Pain   Godson Pollan is a 73 y.o. male.   Pt is a 73 yo male with pmhx significant for asthma, htn, bhp, CAD, CHF (last EF 30-35% in October), etoh abuse, and afib (on Eliquis ).  Pt called EMS today because of n/v/d for 2 days.  He also has bilateral knee pain.  He was admitted from 6/9-10 for htn and frequent falls. HH was sent up and pt said they have come, but he continues to have falls.         Prior to Admission medications   Medication Sig Start Date End Date Taking? Authorizing Provider  acetaminophen  (TYLENOL ) 500 MG tablet Take 2 tablets (1,000 mg total) by mouth every 6 (six) hours as needed for mild pain (pain score 1-3) (or Fever >/= 101). 05/08/23   Tammy Sor, PA-C  albuterol  (PROVENTIL  HFA;VENTOLIN  HFA) 108 (90 Base) MCG/ACT inhaler Inhale 2 puffs into the lungs every 4 (four) hours as needed for wheezing or shortness of breath. 07/03/16   Horton, Charmaine FALCON, MD  apixaban  (ELIQUIS ) 5 MG TABS tablet Take 1 tablet (5 mg total) by mouth 2 (two) times daily. 12/17/23 03/16/24  Addie Perkins, DO  atorvastatin  (LIPITOR) 40 MG tablet Take 1 tablet (40 mg total) by mouth at bedtime. 12/17/23   Addie Perkins, DO  furosemide  (LASIX ) 20 MG tablet Take 1 tablet (20 mg total) by mouth daily. 03/24/22 12/16/23  Shona Terry SAILOR, DO  loratadine  (CLARITIN ) 10 MG tablet Take 1 tablet (10 mg total) by mouth daily. 12/18/23   Addie Perkins, DO  losartan  (COZAAR ) 25 MG tablet Take 1 tablet (25 mg total) by mouth every morning. 12/17/23   Addie Perkins, DO  metoprolol  succinate (TOPROL -XL) 100 MG 24 hr tablet Take 1 tablet (100 mg total) by mouth daily. 12/17/23   Addie Perkins, DO  Multiple Vitamin (MULTIVITAMIN WITH MINERALS) TABS tablet Take 1 tablet by mouth daily.    [provider]  omeprazole  (PRILOSEC  OTC) 20 MG tablet  Take 20 mg by mouth daily as needed (for reflux or heartburn).    [provider]  polyethylene glycol (MIRALAX ) 17 g packet Take 17 g by mouth daily. 06/22/23   Ula Prentice SAUNDERS, MD  senna-docusate (SENOKOT-S) 8.6-50 MG tablet Take 1 tablet by mouth at bedtime as needed for moderate constipation. 06/22/23   Ula Prentice SAUNDERS, MD  SYMBICORT 160-4.5 MCG/ACT inhaler Inhale 2 puffs into the lungs daily.  10/14/19   [provider]  thiamine  (VITAMIN B1) 100 MG tablet Take 1 tablet (100 mg total) by mouth daily. 12/18/23   Addie Perkins, DO    Allergies: Patient has no known allergies.    Review of Systems  Gastrointestinal:  Positive for diarrhea, nausea and vomiting.  Musculoskeletal:        Bilateral knee pain  All other systems reviewed and are negative.   Updated Vital Signs BP (!) 147/115   Pulse (!) 113   Temp 99.1 F (37.3 C)   Resp 20   SpO2 99%   Physical Exam Vitals and nursing note reviewed.  Constitutional:      Appearance: Normal appearance.  HENT:     Head: Normocephalic and atraumatic.     Right Ear: External ear normal.     Left Ear: External ear normal.     Nose:  Nose normal.     Mouth/Throat:     Mouth: Mucous membranes are dry.   Eyes:     Extraocular Movements: Extraocular movements intact.     Conjunctiva/sclera: Conjunctivae normal.     Pupils: Pupils are equal, round, and reactive to light.    Cardiovascular:     Rate and Rhythm: Regular rhythm. Tachycardia present.     Pulses: Normal pulses.     Heart sounds: Normal heart sounds.  Pulmonary:     Effort: Pulmonary effort is normal.     Breath sounds: Normal breath sounds.  Abdominal:     General: Abdomen is flat.     Palpations: Abdomen is soft.     Tenderness: There is generalized abdominal tenderness.   Musculoskeletal:        General: Normal range of motion.     Cervical back: Normal range of motion and neck supple.       Legs:   Skin:    General: Skin is warm.     Capillary  Refill: Capillary refill takes less than 2 seconds.   Neurological:     General: No focal deficit present.     Mental Status: He is alert and oriented to person, place, and time.   Psychiatric:        Mood and Affect: Mood normal.        Behavior: Behavior normal.     (all labs ordered are listed, but only abnormal results are displayed) Labs Reviewed  COMPREHENSIVE METABOLIC PANEL WITH GFR - Abnormal; Notable for the following components:      Result Value   Sodium 134 (*)    Chloride 91 (*)    CO2 21 (*)    Glucose, Bld 157 (*)    Creatinine, Ser 2.49 (*)    Total Protein 8.8 (*)    AST 51 (*)    Alkaline Phosphatase 184 (*)    Total Bilirubin 2.2 (*)    GFR, Estimated 27 (*)    Anion gap 22 (*)    All other components within normal limits  CBC - Abnormal; Notable for the following components:   MCV 102.3 (*)    MCH 34.1 (*)    nRBC 0.4 (*)    All other components within normal limits  PROTIME-INR - Abnormal; Notable for the following components:   Prothrombin Time 17.0 (*)    INR 1.3 (*)    All other components within normal limits  I-STAT CG4 LACTIC ACID, ED - Abnormal; Notable for the following components:   Lactic Acid, Venous 4.8 (*)    All other components within normal limits  RESP PANEL BY RT-PCR (RSV, FLU A&B, COVID)  RVPGX2  CULTURE, BLOOD (ROUTINE X 2)  CULTURE, BLOOD (ROUTINE X 2)  URINE CULTURE  LIPASE, BLOOD  ETHANOL  CK  URINALYSIS, ROUTINE W REFLEX MICROSCOPIC    EKG: EKG Interpretation Date/Time:  Thursday January 02 2024 20:54:50 EDT Ventricular Rate:  136 PR Interval:  114 QRS Duration:  137 QT Interval:  401 QTC Calculation: 604 R Axis:   -72  Text Interpretation: Sinus tachycardia LAE, consider biatrial enlargement Nonspecific IVCD with LAD Nonspecific T abnormalities, lateral leads Artifact in lead(s) II III aVR aVL aVF V4 V5 V6 Since last tracing rate faster Confirmed by Dean Clarity [phone]) on 01/02/2024 10:28:29  PM  Radiology: [facility] Knee Complete 4 Views Right Result Date: 01/02/2024 CLINICAL DATA:  Knee pain EXAM: RIGHT KNEE - COMPLETE 4+ VIEW COMPARISON:  None Available. FINDINGS:  No evidence of fracture, dislocation, or joint effusion. No evidence of arthropathy or other focal bone abnormality. Mild prepatellar soft tissue swelling. IMPRESSION: 1. Mild prepatellar soft tissue swelling. No fracture or dislocation. Electronically Signed   By: Dorethia Molt M.D.   On: 01/02/2024 22:41   DG Knee Complete 4 Views Left Result Date: 01/02/2024 CLINICAL DATA:  Knee pain EXAM: LEFT KNEE - COMPLETE 4+ VIEW COMPARISON:  None Available. FINDINGS: No evidence of fracture, dislocation, or joint effusion. No evidence of arthropathy or other focal bone abnormality. Mild prepatellar soft tissue swelling. Vascular calcifications noted. IMPRESSION: 1. Mild prepatellar soft tissue swelling. No fracture or dislocation. Electronically Signed   By: Dorethia Molt M.D.   On: 01/02/2024 22:41   DG Chest 2 View Result Date: 01/02/2024 CLINICAL DATA:  Chest pain EXAM: CHEST - 2 VIEW COMPARISON:  None Available. FINDINGS: Parenchymal scarring within the right apex again noted. Lungs are otherwise clear. No pneumothorax or pleural effusion. Cardiac size within normal limits. Pulmonary vascularity is normal. Acute appearing left fifth rib fracture seen laterally with a small amount of associated pleural thickening IMPRESSION: 1. Acute appearing left fifth rib fracture. No pneumothorax. Electronically Signed   By: Dorethia Molt M.D.   On: 01/02/2024 22:40     Procedures   Medications Ordered in the ED  ceFEPIme (MAXIPIME) 2 g in sodium chloride  0.9 % 100 mL IVPB (2 g Intravenous New Bag/Given 01/02/24 2301)  metroNIDAZOLE (FLAGYL) IVPB 500 mg (has no administration in time range)  vancomycin (VANCOCIN) IVPB 1000 mg/200 mL premix (1,000 mg Intravenous New Bag/Given 01/02/24 2258)  sodium chloride  0.9 % bolus 1,000 mL (1,000 mLs  Intravenous New Bag/Given 01/02/24 2256)  ondansetron  (ZOFRAN ) injection 4 mg (4 mg Intravenous Given 01/02/24 2250)  morphine  (PF) 4 MG/ML injection 4 mg (4 mg Intravenous Given 01/02/24 2251)  LORazepam  (ATIVAN ) injection 1 mg (1 mg Intravenous Given 01/02/24 2253)                                    Medical Decision Making Amount and/or Complexity of Data Reviewed Labs: ordered. Radiology: ordered.  Risk Prescription drug management.   This patient presents to the ED for concern of n/v/d, this involves an extensive number of treatment options, and is a complaint that carries with it a high risk of complications and morbidity.  The differential diagnosis includes w/dr, gastroenteritis, sepsis   Co morbidities that complicate the patient evaluation   asthma, htn, bhp, CAD, CHF (last EF 30-35% in October), etoh abuse, and afib (on Eliquis )   Additional history obtained:  Additional history obtained from epic chart review External records from outside source obtained and reviewed including EMS report   Lab Tests:  I Ordered, and personally interpreted labs.  The pertinent results include:  cbc nl, cmp with cr elevated at 2.49 (0.93 on 6/10); LFTS with ap elevated at 184 and TB elevated at 2.2   Imaging Studies ordered:  I ordered imaging studies including cxr, bilateral knee  I independently visualized and interpreted imaging which showed  CXR: Acute appearing left fifth rib fracture. No pneumothorax. R and left knee:    Mild prepatellar soft tissue swelling. No fracture or  dislocation.   I agree with the radiologist interpretation   Cardiac Monitoring:  The patient was maintained on a cardiac monitor.  I personally viewed and interpreted the cardiac monitored which showed an underlying rhythm of:  st   Medicines ordered and prescription drug management:  I ordered medication including ivfs/abx/ativan   for sx  Reevaluation of the patient after these medicines  showed that the patient improved I have reviewed the patients home medicines and have made adjustments as needed   Test Considered:  ct   Critical Interventions:  ivfs  Problem List / ED Course:  Aki:  ivfs given.  EF 30%, so I did not flood pt with fluids Tachycardia:  code sepsis called and abx given.  Pt is dehydrated and possibly in some etoh w/dr. Rib fx:  incentive spirometer ordered.   Reevaluation:  After the interventions noted above, I reevaluated the patient and found that they have :improved   Social Determinants of Health:  Lives at home   Dispostion:  After consideration of the diagnostic results and the patients response to treatment, I feel that the patent would benefit from admission.    CRITICAL CARE Performed by: Mliss Boyers   Total critical care time: 30 minutes  Critical care time was exclusive of separately billable procedures and treating other patients.  Critical care was necessary to treat or prevent imminent or life-threatening deterioration.  Critical care was time spent personally by me on the following activities: development of treatment plan with patient and/or surrogate as well as nursing, discussions with consultants, evaluation of patient's response to treatment, examination of patient, obtaining history from patient or surrogate, ordering and performing treatments and interventions, ordering and review of laboratory studies, ordering and review of radiographic studies, pulse oximetry and re-evaluation of patient's condition.     Final diagnoses:  AKI (acute kidney injury) (HCC)  Closed fracture of one rib of right side, initial encounter  Ambulatory dysfunction  Contusion of knee, unspecified laterality, initial encounter  Dehydration    ED Discharge Orders     None          Boyers Mliss, MD 01/02/24 2311

## 2024-01-03 ENCOUNTER — Emergency Department (HOSPITAL_COMMUNITY)

## 2024-01-03 ENCOUNTER — Inpatient Hospital Stay (HOSPITAL_COMMUNITY)

## 2024-01-03 DIAGNOSIS — I252 Old myocardial infarction: Secondary | ICD-10-CM | POA: Diagnosis not present

## 2024-01-03 DIAGNOSIS — Z7901 Long term (current) use of anticoagulants: Secondary | ICD-10-CM | POA: Diagnosis not present

## 2024-01-03 DIAGNOSIS — I2510 Atherosclerotic heart disease of native coronary artery without angina pectoris: Secondary | ICD-10-CM | POA: Diagnosis present

## 2024-01-03 DIAGNOSIS — S2231XA Fracture of one rib, right side, initial encounter for closed fracture: Secondary | ICD-10-CM | POA: Diagnosis present

## 2024-01-03 DIAGNOSIS — R112 Nausea with vomiting, unspecified: Secondary | ICD-10-CM | POA: Diagnosis not present

## 2024-01-03 DIAGNOSIS — Z800 Family history of malignant neoplasm of digestive organs: Secondary | ICD-10-CM | POA: Diagnosis not present

## 2024-01-03 DIAGNOSIS — I110 Hypertensive heart disease with heart failure: Secondary | ICD-10-CM | POA: Diagnosis present

## 2024-01-03 DIAGNOSIS — Y900 Blood alcohol level of less than 20 mg/100 ml: Secondary | ICD-10-CM | POA: Diagnosis present

## 2024-01-03 DIAGNOSIS — M25569 Pain in unspecified knee: Secondary | ICD-10-CM | POA: Diagnosis not present

## 2024-01-03 DIAGNOSIS — R7989 Other specified abnormal findings of blood chemistry: Secondary | ICD-10-CM | POA: Diagnosis present

## 2024-01-03 DIAGNOSIS — I5042 Chronic combined systolic (congestive) and diastolic (congestive) heart failure: Secondary | ICD-10-CM | POA: Diagnosis present

## 2024-01-03 DIAGNOSIS — I480 Paroxysmal atrial fibrillation: Secondary | ICD-10-CM | POA: Diagnosis present

## 2024-01-03 DIAGNOSIS — W19XXXA Unspecified fall, initial encounter: Secondary | ICD-10-CM | POA: Diagnosis present

## 2024-01-03 DIAGNOSIS — F10139 Alcohol abuse with withdrawal, unspecified: Secondary | ICD-10-CM | POA: Diagnosis present

## 2024-01-03 DIAGNOSIS — E860 Dehydration: Secondary | ICD-10-CM | POA: Diagnosis present

## 2024-01-03 DIAGNOSIS — E876 Hypokalemia: Secondary | ICD-10-CM | POA: Diagnosis present

## 2024-01-03 DIAGNOSIS — S2232XA Fracture of one rib, left side, initial encounter for closed fracture: Secondary | ICD-10-CM | POA: Diagnosis present

## 2024-01-03 DIAGNOSIS — Z7951 Long term (current) use of inhaled steroids: Secondary | ICD-10-CM | POA: Diagnosis not present

## 2024-01-03 DIAGNOSIS — N179 Acute kidney failure, unspecified: Secondary | ICD-10-CM

## 2024-01-03 DIAGNOSIS — A419 Sepsis, unspecified organism: Secondary | ICD-10-CM | POA: Diagnosis present

## 2024-01-03 DIAGNOSIS — E872 Acidosis, unspecified: Secondary | ICD-10-CM | POA: Diagnosis present

## 2024-01-03 DIAGNOSIS — S2249XA Multiple fractures of ribs, unspecified side, initial encounter for closed fracture: Secondary | ICD-10-CM | POA: Insufficient documentation

## 2024-01-03 DIAGNOSIS — Z743 Need for continuous supervision: Secondary | ICD-10-CM | POA: Diagnosis not present

## 2024-01-03 DIAGNOSIS — S8000XA Contusion of unspecified knee, initial encounter: Secondary | ICD-10-CM | POA: Diagnosis present

## 2024-01-03 DIAGNOSIS — D7589 Other specified diseases of blood and blood-forming organs: Secondary | ICD-10-CM | POA: Diagnosis present

## 2024-01-03 DIAGNOSIS — K2289 Other specified disease of esophagus: Secondary | ICD-10-CM | POA: Diagnosis present

## 2024-01-03 DIAGNOSIS — I10 Essential (primary) hypertension: Secondary | ICD-10-CM | POA: Diagnosis not present

## 2024-01-03 DIAGNOSIS — R296 Repeated falls: Secondary | ICD-10-CM | POA: Diagnosis present

## 2024-01-03 DIAGNOSIS — Z1152 Encounter for screening for COVID-19: Secondary | ICD-10-CM | POA: Diagnosis not present

## 2024-01-03 DIAGNOSIS — F17210 Nicotine dependence, cigarettes, uncomplicated: Secondary | ICD-10-CM | POA: Diagnosis present

## 2024-01-03 DIAGNOSIS — Z23 Encounter for immunization: Secondary | ICD-10-CM | POA: Diagnosis present

## 2024-01-03 DIAGNOSIS — N400 Enlarged prostate without lower urinary tract symptoms: Secondary | ICD-10-CM | POA: Diagnosis present

## 2024-01-03 DIAGNOSIS — R262 Difficulty in walking, not elsewhere classified: Secondary | ICD-10-CM | POA: Diagnosis present

## 2024-01-03 DIAGNOSIS — J4489 Other specified chronic obstructive pulmonary disease: Secondary | ICD-10-CM | POA: Diagnosis present

## 2024-01-03 LAB — CBC WITH DIFFERENTIAL/PLATELET
Abs Immature Granulocytes: 0.05 10*3/uL (ref 0.00–0.07)
Basophils Absolute: 0 10*3/uL (ref 0.0–0.1)
Basophils Relative: 0 %
Eosinophils Absolute: 0 10*3/uL (ref 0.0–0.5)
Eosinophils Relative: 0 %
HCT: 39.3 % (ref 39.0–52.0)
Hemoglobin: 13.7 g/dL (ref 13.0–17.0)
Immature Granulocytes: 1 %
Lymphocytes Relative: 16 %
Lymphs Abs: 1.4 10*3/uL (ref 0.7–4.0)
MCH: 33.7 pg (ref 26.0–34.0)
MCHC: 34.9 g/dL (ref 30.0–36.0)
MCV: 96.6 fL (ref 80.0–100.0)
Monocytes Absolute: 0.7 10*3/uL (ref 0.1–1.0)
Monocytes Relative: 8 %
Neutro Abs: 6.8 10*3/uL (ref 1.7–7.7)
Neutrophils Relative %: 75 %
Platelets: 252 10*3/uL (ref 150–400)
RBC: 4.07 MIL/uL — ABNORMAL LOW (ref 4.22–5.81)
RDW: 13.3 % (ref 11.5–15.5)
WBC: 9 10*3/uL (ref 4.0–10.5)
nRBC: 0 % (ref 0.0–0.2)

## 2024-01-03 LAB — COMPREHENSIVE METABOLIC PANEL WITH GFR
ALT: 22 U/L (ref 0–44)
AST: 32 U/L (ref 15–41)
Albumin: 3.3 g/dL — ABNORMAL LOW (ref 3.5–5.0)
Alkaline Phosphatase: 136 U/L — ABNORMAL HIGH (ref 38–126)
Anion gap: 17 — ABNORMAL HIGH (ref 5–15)
BUN: 20 mg/dL (ref 8–23)
CO2: 23 mmol/L (ref 22–32)
Calcium: 8.5 mg/dL — ABNORMAL LOW (ref 8.9–10.3)
Chloride: 94 mmol/L — ABNORMAL LOW (ref 98–111)
Creatinine, Ser: 2.29 mg/dL — ABNORMAL HIGH (ref 0.61–1.24)
GFR, Estimated: 30 mL/min — ABNORMAL LOW (ref >60)
Glucose, Bld: 116 mg/dL — ABNORMAL HIGH (ref 70–99)
Potassium: 3.4 mmol/L — ABNORMAL LOW (ref 3.5–5.1)
Sodium: 134 mmol/L — ABNORMAL LOW (ref 135–145)
Total Bilirubin: 1.8 mg/dL — ABNORMAL HIGH (ref 0.0–1.2)
Total Protein: 7.1 g/dL (ref 6.5–8.1)

## 2024-01-03 LAB — URINALYSIS, ROUTINE W REFLEX MICROSCOPIC
Bacteria, UA: NONE SEEN
Bilirubin Urine: NEGATIVE
Glucose, UA: NEGATIVE mg/dL
Hgb urine dipstick: NEGATIVE
Ketones, ur: 5 mg/dL — AB
Leukocytes,Ua: NEGATIVE
Nitrite: NEGATIVE
Protein, ur: 30 mg/dL — AB
Specific Gravity, Urine: 1.024 (ref 1.005–1.030)
pH: 5 (ref 5.0–8.0)

## 2024-01-03 LAB — I-STAT CG4 LACTIC ACID, ED: Lactic Acid, Venous: 2 mmol/L (ref 0.5–1.9)

## 2024-01-03 LAB — LACTIC ACID, PLASMA: Lactic Acid, Venous: 1.6 mmol/L (ref 0.5–1.9)

## 2024-01-03 LAB — MAGNESIUM: Magnesium: 1.2 mg/dL — ABNORMAL LOW (ref 1.7–2.4)

## 2024-01-03 MED ORDER — THIAMINE MONONITRATE 100 MG PO TABS
100.0000 mg | ORAL_TABLET | Freq: Every day | ORAL | Status: DC
  Administered 2024-01-05 – 2024-01-07 (×3): 100 mg via ORAL
  Filled 2024-01-03 (×3): qty 1

## 2024-01-03 MED ORDER — METRONIDAZOLE 500 MG/100ML IV SOLN
500.0000 mg | Freq: Two times a day (BID) | INTRAVENOUS | Status: DC
  Administered 2024-01-03 – 2024-01-04 (×4): 500 mg via INTRAVENOUS
  Filled 2024-01-03 (×5): qty 100

## 2024-01-03 MED ORDER — POTASSIUM CHLORIDE 10 MEQ/100ML IV SOLN
INTRAVENOUS | Status: AC
  Filled 2024-01-03: qty 100

## 2024-01-03 MED ORDER — FENTANYL CITRATE PF 50 MCG/ML IJ SOSY
25.0000 ug | PREFILLED_SYRINGE | INTRAMUSCULAR | Status: DC | PRN
  Administered 2024-01-04: 25 ug via INTRAVENOUS
  Filled 2024-01-03: qty 1

## 2024-01-03 MED ORDER — SODIUM CHLORIDE 0.9 % IV SOLN
2.0000 g | INTRAVENOUS | Status: DC
  Administered 2024-01-03 – 2024-01-05 (×3): 2 g via INTRAVENOUS
  Filled 2024-01-03 (×3): qty 20

## 2024-01-03 MED ORDER — LORAZEPAM 1 MG PO TABS
1.0000 mg | ORAL_TABLET | ORAL | Status: AC | PRN
  Administered 2024-01-03: 1 mg via ORAL
  Filled 2024-01-03: qty 1

## 2024-01-03 MED ORDER — ONDANSETRON HCL 4 MG/2ML IJ SOLN: 4.0000 mg | Freq: Four times a day (QID) | INTRAMUSCULAR | Status: DC | PRN

## 2024-01-03 MED ORDER — METOPROLOL TARTRATE 5 MG/5ML IV SOLN
5.0000 mg | Freq: Three times a day (TID) | INTRAVENOUS | Status: DC
  Administered 2024-01-03 – 2024-01-05 (×6): 5 mg via INTRAVENOUS
  Filled 2024-01-03 (×6): qty 5

## 2024-01-03 MED ORDER — MAGNESIUM SULFATE 2 GM/50ML IV SOLN
2.0000 g | Freq: Once | INTRAVENOUS | Status: AC
  Administered 2024-01-03: 2 g via INTRAVENOUS
  Filled 2024-01-03: qty 50

## 2024-01-03 MED ORDER — LACTATED RINGERS IV BOLUS
500.0000 mL | Freq: Once | INTRAVENOUS | Status: AC
  Administered 2024-01-03: 500 mL via INTRAVENOUS

## 2024-01-03 MED ORDER — ACETAMINOPHEN 325 MG PO TABS
650.0000 mg | ORAL_TABLET | Freq: Four times a day (QID) | ORAL | Status: DC | PRN
  Administered 2024-01-03 – 2024-01-07 (×4): 650 mg via ORAL
  Filled 2024-01-03 (×4): qty 2

## 2024-01-03 MED ORDER — ACETAMINOPHEN 650 MG RE SUPP: 650.0000 mg | Freq: Four times a day (QID) | RECTAL | Status: DC | PRN

## 2024-01-03 MED ORDER — LORAZEPAM 2 MG/ML IJ SOLN
1.0000 mg | INTRAMUSCULAR | Status: AC | PRN
  Administered 2024-01-03: 4 mg via INTRAVENOUS
  Administered 2024-01-04: 2 mg via INTRAVENOUS
  Administered 2024-01-04: 3 mg via INTRAVENOUS
  Administered 2024-01-04: 2 mg via INTRAVENOUS
  Administered 2024-01-05: 3 mg via INTRAVENOUS
  Filled 2024-01-03: qty 2
  Filled 2024-01-03: qty 1
  Filled 2024-01-03: qty 2
  Filled 2024-01-03: qty 1
  Filled 2024-01-03: qty 2

## 2024-01-03 MED ORDER — FOLIC ACID 1 MG PO TABS
1.0000 mg | ORAL_TABLET | Freq: Every day | ORAL | Status: DC
  Administered 2024-01-03 – 2024-01-07 (×4): 1 mg via ORAL
  Filled 2024-01-03 (×4): qty 1

## 2024-01-03 MED ORDER — NALOXONE HCL 0.4 MG/ML IJ SOLN: 0.4000 mg | INTRAMUSCULAR | Status: DC | PRN

## 2024-01-03 MED ORDER — PIPERACILLIN-TAZOBACTAM 3.375 G IVPB 30 MIN
3.3750 g | Freq: Once | INTRAVENOUS | Status: AC
  Administered 2024-01-03: 3.375 g via INTRAVENOUS
  Filled 2024-01-03: qty 50

## 2024-01-03 MED ORDER — THIAMINE HCL 100 MG/ML IJ SOLN
100.0000 mg | Freq: Every day | INTRAMUSCULAR | Status: DC
  Administered 2024-01-03 – 2024-01-04 (×2): 100 mg via INTRAVENOUS
  Filled 2024-01-03 (×3): qty 2

## 2024-01-03 MED ORDER — ADULT MULTIVITAMIN W/MINERALS CH
1.0000 | ORAL_TABLET | Freq: Every day | ORAL | Status: DC
  Administered 2024-01-03 – 2024-01-07 (×4): 1 via ORAL
  Filled 2024-01-03 (×4): qty 1

## 2024-01-03 NOTE — Progress Notes (Signed)
 Patient arrived to room 4E03 from [facility].  Assessment complete, VS obtained, and Admission database began.

## 2024-01-03 NOTE — H&P (Signed)
 History and Physical    Patient: Mark [name] DOB: [birth year] DOA: 01/02/2024 DOS: the patient was seen and examined on 01/03/2024 PCP: Campbell Reynolds, NP  Patient coming from: Home  Chief Complaint:  Chief Complaint  Patient presents with   Vomiting   Knee Pain   HPI: Mark [name] is a 73 y.o. male with medical history significant of normal TSH, alcohol abuse, history of delirium, allergic rhinitis, asthma, BPH, carpal tunnel syndrome, chronic combined systolic and diastolic heart failure, tobacco abuse, hypertension, paroxysmal atrial fibrillation on Eliquis , CAD, type II myocardial infarction who was recently admitted to the hospital for 1 day on 12/16/2023 due to hypertension and frequent falls who was brought to the emergency department via EMS due to bilateral knee for 1 day, nausea, vomiting and diarrhea for the last few days.  Poor historian.   No constipation, melena or hematochezia.  No flank pain, dysuria, frequency or hematuria. He denied fever, chills, rhinorrhea, sore throat, wheezing or hemoptysis.  No chest pain, palpitations, diaphoresis, PND, orthopnea or pitting edema of the lower extremities. No polyuria, polydipsia, polyphagia or blurred vision.   Lab work: Lactic acid 4.8 then 2.0 then 1.6 mmol/L.  CBC with a white count of 10.1, hemoglobin 16.4 g/dL with an MCV 897.6 fL and platelets 332. Coronavirus, influenza and RSV PCR test was negative.  PT was 17.0 and INR 1.3.  Total CK8 142 and lipase 32 units/L.  Magnesium  was 1.2 Alcohol was less than 15 mg/dL.  CMP showed a corrected 535, potassium 3.5, chloride 91 and CO2 21 mmol/L with an anion gap of 22.  Total bilirubin 2.2, glucose 157, BUN 16, creatinine 2.49 calcium  9.5 mg/dL.  Total protein 8.9 albumin 4.2 g/dL.  AST 51, ALT 32 and alkaline phosphatase 184 units/L.    Imaging: 2 view chest radiograph with acute appearing left fifth rib fracture.  No pneumothorax.  Riding left knee x-ray showing mild  prepatellar soft tissue swelling without fracture or dislocation bilaterally.  CT abdomen/pelvis without contrast showed small hiatal hernia with interval development normal opacified infiltrative fluid within the posterior mediastinum of unclear etiology, but possibly related to esophageal perforation given history of recurrent vomiting.  These would be better assess dedicated CT examination of the chest utilizing an oral contrast only esophageal perforation protocol.  Mild hepatic asteatosis.  Moderate prostatic hypertrophy.  CT chest without contrast no oral contrast is identified on the scan.  Redemonstration of mild circumferential wall thickening in the lower thoracic esophagus with associated paraesophageal free fluid in the posterior lower mediastinum, which appears mildly increased in volume since 01/03/2024 and CT abdomen study.  No definite extraluminal gas or pneumatosis.  Esophageal perforation not excluded.  No acute pulmonary disease.  Two-vessel coronary arthrosclerosis.  Diffuse hepatic steatosis.  Healing anterolateral left 3rd through 7th rib fractures new from prior chest CT.  Aortic atherosclerosis and emphysema.   ED course: Initial vital signs were temperature 98.3 F, pulse 139 respiration 19, BP 146/1 1 mmHg O2 sat 100% on room air.  The patient received 1000 mL of normal saline and 500 mL of LR bolus, morphine  4 mg IVP, magnesium  sulfate 2 g IVPB, lorazepam  1 mg IVP, cefepime  2 g IVPB, 20 decile 500 mg IVPB, Zosyn 3.375 g IVPB and vancomycin  1000 mg IVPB.  Review of Systems: As mentioned in the history of present illness. All other systems reviewed and are negative. Past Medical History:  Diagnosis Date   Abnormal thyroid  function test    Alcohol abuse  Allergic rhinitis    Asthma    BPH (benign prostatic hyperplasia)    Carpal tunnel syndrome    Chronic heart failure with preserved ejection fraction (HFpEF) (HCC)    Delirium    Hypertension    PAF (paroxysmal atrial  fibrillation) (HCC)    Tobacco abuse    Type 2 MI (myocardial infarction) [facility])    Past Surgical History:  Procedure Laterality Date   INGUINAL HERNIA REPAIR Bilateral 05/06/2023   Procedure: HERNIA REPAIR INGUINAL ADULT with MESH;  Surgeon: Vernetta Berg, MD;  Location: [facility] ORS;  Service: General;  Laterality: Bilateral;   Social History:  reports that he has been smoking cigarettes. He has never used smokeless tobacco. He reports current alcohol use. He reports that he does not use drugs.  No Known Allergies  Family History  Problem Relation Age of Onset   Colon cancer Father     Prior to Admission medications   Medication Sig Start Date End Date Taking? Authorizing Provider  acetaminophen  (TYLENOL ) 500 MG tablet Take 2 tablets (1,000 mg total) by mouth every 6 (six) hours as needed for mild pain (pain score 1-3) (or Fever >/= 101). 05/08/23   Tammy Sor, PA-C  albuterol  (PROVENTIL  HFA;VENTOLIN  HFA) 108 (90 Base) MCG/ACT inhaler Inhale 2 puffs into the lungs every 4 (four) hours as needed for wheezing or shortness of breath. 07/03/16   Horton, Charmaine FALCON, MD  apixaban  (ELIQUIS ) 5 MG TABS tablet Take 1 tablet (5 mg total) by mouth 2 (two) times daily. 12/17/23 03/16/24  Addie Perkins, DO  atorvastatin  (LIPITOR) 40 MG tablet Take 1 tablet (40 mg total) by mouth at bedtime. 12/17/23   Addie Perkins, DO  furosemide  (LASIX ) 20 MG tablet Take 1 tablet (20 mg total) by mouth daily. 03/24/22 12/16/23  Shona Terry SAILOR, DO  loratadine  (CLARITIN ) 10 MG tablet Take 1 tablet (10 mg total) by mouth daily. 12/18/23   Addie Perkins, DO  losartan  (COZAAR ) 25 MG tablet Take 1 tablet (25 mg total) by mouth every morning. 12/17/23   Addie Perkins, DO  losartan  (COZAAR ) 50 MG tablet Take 50 mg by mouth daily. 12/18/23   [provider]  metoprolol  succinate (TOPROL -XL) 100 MG 24 hr tablet Take 1 tablet (100 mg total) by mouth daily. 12/17/23   Addie Perkins, DO  Multiple Vitamin (MULTIVITAMIN WITH MINERALS) TABS  tablet Take 1 tablet by mouth daily.    [provider]  omeprazole  (PRILOSEC  OTC) 20 MG tablet Take 20 mg by mouth daily as needed (for reflux or heartburn).    [provider]  polyethylene glycol (MIRALAX ) 17 g packet Take 17 g by mouth daily. 06/22/23   Ula Prentice SAUNDERS, MD  senna-docusate (SENOKOT-S) 8.6-50 MG tablet Take 1 tablet by mouth at bedtime as needed for moderate constipation. 06/22/23   Ula Prentice SAUNDERS, MD  SYMBICORT 160-4.5 MCG/ACT inhaler Inhale 2 puffs into the lungs daily.  10/14/19   [provider]  thiamine  (VITAMIN B1) 100 MG tablet Take 1 tablet (100 mg total) by mouth daily. 12/18/23   Addie Perkins, DO    Physical Exam: Vitals:   01/03/24 0400 01/03/24 0430 01/03/24 0500 01/03/24 0645  BP: (!) 169/102 (!) 183/96 (!) 161/107   Pulse: (!) 104 (!) 101 (!) 119   Resp: 16 15 18    Temp:    98.7 F (37.1 C)  TempSrc:    Oral  SpO2: 97% 96% 97%    Physical Exam  Data Reviewed:  Results are pending,  will review when available.  05/06/2023 echocardiogram report. IMPRESSIONS:   1. Left ventricular ejection fraction, by estimation, is 30 to 35%. The  left ventricle has moderately decreased function. The left ventricle  demonstrates global hypokinesis. Left ventricular diastolic parameters are  consistent with Grade I diastolic  dysfunction (impaired relaxation).   2. Right ventricular systolic function is normal. The right ventricular  size is normal.   3. The mitral valve is normal in structure. Trivial mitral valve  regurgitation. No evidence of mitral stenosis.   4. The aortic valve is tricuspid. There is mild calcification of the  aortic valve. Aortic valve regurgitation is not visualized. Aortic valve  sclerosis/calcification is present, without any evidence of aortic  stenosis.   5. The inferior vena cava is normal in size with greater than 50%  respiratory variability, suggesting right atrial pressure of 3 mmHg.   EKG: Vent. rate 136  BPM PR interval 114 ms QRS duration 137 ms QT/QTcB 401/604 ms P-R-T axes 86 -72 85 Sinus tachycardia LAE, consider biatrial enlargement Nonspecific IVCD with LAD Nonspecific T abnormalities, lateral leads Artifact in lead(s) II III aVR aVL aVF V4 V5 V6  Assessment and Plan: Principal Problem:   AKI (acute kidney injury) (HCC) Secondary to:   Sepsis due to undetermined organism (HCC) In the setting of:   Paraesophageal fluid collection Admit to telemetry/inpatient. Analgesics as needed. No maintenance fluid due to CHF. Hold ARB/ACE. Hold diuretic. Avoid hypotension. Avoid nephrotoxins. Monitor intake and output. Monitor renal function/electrolytes. Continue ceftriaxone  2 g IVPB every 24 hours.   Continue metronidazole  500 mg IVPB q 12 hr. Follow-up blood culture and sensitivity Will need GI evaluation for endoscopy.  Active Problems:   Tobacco abuse Tobacco cessation advised. Nicotine  replacement therapy ordered.    Chronic alcohol abuse Leading to:   Abnormal LFTs   Hypomagnesemia   Macrocytosis CIWA protocol with lorazepam . Magnesium  sulfate supplementation. Folate, MVI and thiamine . Consult TOC team.    COPD (chronic obstructive pulmonary disease) (HCC) Bronchodilators as needed. Supplemental oxygen as needed.    Chronic combined systolic and diastolic congestive heart failure (HCC) No signs of decompensation. Parenteral metoprolol  while NPO.    Paroxysmal atrial fibrillation (HCC) Hold DOAC. Switch to SQ Lovenox . Will use parenteral metoprolol .    Hypokalemia Replacing. Magnesium  was supplemented.    Multiple rib fractures  Continue analgesics as needed.   Advance Care Planning:   Code Status: Full Code   Consults: Cardiothoracic surgery will evaluate once at [facility].  Family Communication:   Severity of Illness: The appropriate patient status for this patient is INPATIENT. Inpatient status is judged to be reasonable and  necessary in order to provide the required intensity of service to ensure the patient's safety. The patient's presenting symptoms, physical exam findings, and initial radiographic and laboratory data in the context of their chronic comorbidities is felt to place them at high risk for further clinical deterioration. Furthermore, it is not anticipated that the patient will be medically stable for discharge from the hospital within 2 midnights of admission.   * I certify that at the point of admission it is my clinical judgment that the patient will require inpatient hospital care spanning beyond 2 midnights from the point of admission due to high intensity of service, high risk for further deterioration and high frequency of surveillance required.*  Author: Alm Dorn Castor, MD 01/03/2024 7:45 AM  For on call review [email or web address].   This document was prepared using Conservation officer, historic buildings and  may contain some unintended transcription errors.

## 2024-01-03 NOTE — Progress Notes (Addendum)
  Carryover admission to the Day Admitter.  I discussed this case with the EDP, Dr. Carita.  Per these discussions:   This is a 73 year old male with history of chronic systolic heart failure with most recent LVEF of 30 to 35%, paroxysmal atrial fibrillation chronically anticoagulated on Eliquis , who is being admitted with acute kidney injury in the setting of nausea, vomiting, diarrhea, with presenting labs notable for lactic acidosis and imaging notable for evidence of acute left fifth rib fracture without pneumothorax, presenting with nausea, vomiting, diarrhea over the last few days.   Vital signs in the ED were notable for the following: Temperature max 99.0.  Initial heart rates in the 130s, essentially decreasing into the lower 100s following interval IV fluids and broad-spectrum IV antibiotics; systolic blood pressures in the 140s; oxygen saturation 99 to 100% on room air.  CBC notable for white cell count 2100, with CMP demonstrating evidence of acute kidney injury, with presenting creatinine 2.5.  Initial lactate 4.8, with repeat value currently pending.  Urinalysis has been ordered, with result currently pending.  CT abdomen/pelvis has been  ordered by EDP, with result currently pending.  Chest x-ray showed evidence of acute left fifth rib fracture without pneumothorax, and without evidence of any additional acute cardiopulmonary process, including no evidence of infiltrate.  In the ED, patient received 1 L NS bolus, and is about to receive an additional 500 cc NS bolus as ordered by EDP.  Blood cultures x 2 were collected and the patient subsequently received broad-spectrum antibiotics in the form of IV vancomycin , cefepime , IV Flagyl .  I have placed an order for inpatient admission to PCU for further evaluation and management of the above.  I have placed some additional preliminary admit orders via the adult multi-morbid admission order set. I have also ordered n.p.o. for now, prn IV  Zofran .  Additionally, pending result of CT abdomen/pelvis have ordered Rocephin /IV Flagyl  for concern regarding potential intra-abdominal infection.  Repeat lactic acid level is currently pending, and have ordered a third lactate to be checked later this morning.  Regarding his acute left fifth rib fracture identified on presenting chest x-ray, ordered prn IV fentanyl  as well as incentive spirometry.  Also ordered morning labs in the form of CMP, CBC, and magnesium  level.   Update: Per my additional discussions with EDP, there may be an element of alcohol withdrawal intruding to the patient's elevated heart rate, with his history of chronic alcohol abuse also potentially contributing to his elevated lactic acid level.  I have subsequently completed the CIWA order set with prn IV Ativan , placed consult for transition of care team, and have ordered daily supplementation with thiamine , folic acid , and multivitamin, with first dose of IV thiamine  to occur now.    Update: CT abd/pelvis showed mild paraesophageal infiltrative fluid within the posterior mediastinum of unclear etiology but possibly related to esophageal perforation. EDP contacting on-call CT surgery, and admit order updated from [facility] to [facility].    Eva Pore, DO Hospitalist

## 2024-01-03 NOTE — Progress Notes (Signed)
 Pt had 1/2 bottle of vodka in room under his bed, primary nurse took vodka away. Spoke to [facility] who said that we could not save vodka, therefore it was wasted with Camellia PEAK

## 2024-01-03 NOTE — ED Provider Notes (Signed)
 Care assumed from Dr. Dean.  Patient here with nausea, vomiting, diarrhea and bilateral knee pain.  Meet sepsis criteria with tachycardia, lactic acidosis, AKI, leukocytosis.  Reduced ejection fraction of 30%.  1 L of IVF given.  CT abdomen pelvis pending for further assessment. Chest x-ray did show elevated rib fracture without pneumothorax.  Admission discussed with Dr. Marcene.  Remains tachycardic in the 110s.  Stable blood pressure.  CT scan is concerning for periesophageal fluid in the posterior mediastinum with concern for possible esophageal perforation.  Will obtain CT of the chest and discussed with thoracic surgery.  Dr. Marcene updated.   Heart rate has improved.  Heart rate has improved with IVF. Dr Dean reports some concern for alcohol withdrawal as well and will initiate Ativan  protocol.  Discussed with Dr. Lucas thoracic surgery.  Recommends CT scan with oral contrast versus esophagram to confirm diagnosis.  Questionable esophageal rupture at this time but may need stent if this is truly ruptured.  Patient already received broad-spectrum antibiotics. Will initiate admission to [facility]. Continue broad-spectrum antibiotics while cultures are pending.  Will plan admission to [facility] given availability of thoracic surgery.  Will obtain CT scan with oral contrast per recommendations of thoracic surgery.  .Critical Care  Performed by: Carita Senior, MD Authorized by: Carita Senior, MD   Critical care provider statement:    Critical care time (minutes):  45   Critical care time was exclusive of:  Separately billable procedures and treating other patients   Critical care was necessary to treat or prevent imminent or life-threatening deterioration of the following conditions:  Sepsis   Critical care was time spent personally by me on the following activities:  Development of treatment plan with patient or surrogate, discussions with consultants, evaluation of  patient's response to treatment, examination of patient, ordering and review of laboratory studies, ordering and review of radiographic studies, ordering and performing treatments and interventions, pulse oximetry, re-evaluation of patient's condition, review of old charts, blood draw for specimens and obtaining history from patient or surrogate   I assumed direction of critical care for this patient from another provider in my specialty: no     Care discussed with: admitting provider       Carita Senior, MD 01/03/24 [phone]

## 2024-01-03 NOTE — Sepsis Progress Note (Signed)
 Notified bedside nurse of need to draw repeat lactic acid.

## 2024-01-03 NOTE — ED Notes (Signed)
No urine noted at this time

## 2024-01-04 ENCOUNTER — Other Ambulatory Visit: Payer: Self-pay

## 2024-01-04 ENCOUNTER — Inpatient Hospital Stay (HOSPITAL_COMMUNITY)

## 2024-01-04 DIAGNOSIS — R7989 Other specified abnormal findings of blood chemistry: Secondary | ICD-10-CM | POA: Diagnosis not present

## 2024-01-04 DIAGNOSIS — K2289 Other specified disease of esophagus: Secondary | ICD-10-CM

## 2024-01-04 DIAGNOSIS — I5042 Chronic combined systolic (congestive) and diastolic (congestive) heart failure: Secondary | ICD-10-CM

## 2024-01-04 DIAGNOSIS — E876 Hypokalemia: Secondary | ICD-10-CM

## 2024-01-04 DIAGNOSIS — I480 Paroxysmal atrial fibrillation: Secondary | ICD-10-CM

## 2024-01-04 DIAGNOSIS — J449 Chronic obstructive pulmonary disease, unspecified: Secondary | ICD-10-CM

## 2024-01-04 DIAGNOSIS — S2242XA Multiple fractures of ribs, left side, initial encounter for closed fracture: Secondary | ICD-10-CM

## 2024-01-04 DIAGNOSIS — N179 Acute kidney failure, unspecified: Secondary | ICD-10-CM | POA: Diagnosis not present

## 2024-01-04 DIAGNOSIS — Z720 Tobacco use: Secondary | ICD-10-CM

## 2024-01-04 DIAGNOSIS — F1010 Alcohol abuse, uncomplicated: Secondary | ICD-10-CM

## 2024-01-04 LAB — COMPREHENSIVE METABOLIC PANEL WITH GFR
ALT: 20 U/L (ref 0–44)
AST: 30 U/L (ref 15–41)
Albumin: 2.8 g/dL — ABNORMAL LOW (ref 3.5–5.0)
Alkaline Phosphatase: 119 U/L (ref 38–126)
Anion gap: 13 (ref 5–15)
BUN: 14 mg/dL (ref 8–23)
CO2: 24 mmol/L (ref 22–32)
Calcium: 8.5 mg/dL — ABNORMAL LOW (ref 8.9–10.3)
Chloride: 99 mmol/L (ref 98–111)
Creatinine, Ser: 1.26 mg/dL — ABNORMAL HIGH (ref 0.61–1.24)
GFR, Estimated: >60 mL/min (ref >60)
Glucose, Bld: 74 mg/dL (ref 70–99)
Potassium: 3.1 mmol/L — ABNORMAL LOW (ref 3.5–5.1)
Sodium: 136 mmol/L (ref 135–145)
Total Bilirubin: 1.3 mg/dL — ABNORMAL HIGH (ref 0.0–1.2)
Total Protein: 6.4 g/dL — ABNORMAL LOW (ref 6.5–8.1)

## 2024-01-04 LAB — CBC
HCT: 37 % — ABNORMAL LOW (ref 39.0–52.0)
Hemoglobin: 12.7 g/dL — ABNORMAL LOW (ref 13.0–17.0)
MCH: 33.7 pg (ref 26.0–34.0)
MCHC: 34.3 g/dL (ref 30.0–36.0)
MCV: 98.1 fL (ref 80.0–100.0)
Platelets: 226 10*3/uL (ref 150–400)
RBC: 3.77 MIL/uL — ABNORMAL LOW (ref 4.22–5.81)
RDW: 13 % (ref 11.5–15.5)
WBC: 8.2 10*3/uL (ref 4.0–10.5)
nRBC: 0 % (ref 0.0–0.2)

## 2024-01-04 LAB — MAGNESIUM: Magnesium: 1.7 mg/dL (ref 1.7–2.4)

## 2024-01-04 LAB — URINE CULTURE: Culture: NO GROWTH

## 2024-01-04 MED ORDER — IOHEXOL 300 MG/ML  SOLN
100.0000 mL | Freq: Once | INTRAMUSCULAR | Status: AC | PRN
  Administered 2024-01-04: 100 mL via ORAL

## 2024-01-04 MED ORDER — NICOTINE 14 MG/24HR TD PT24
14.0000 mg | MEDICATED_PATCH | Freq: Every day | TRANSDERMAL | Status: DC
  Administered 2024-01-04 – 2024-01-07 (×4): 14 mg via TRANSDERMAL
  Filled 2024-01-04 (×4): qty 1

## 2024-01-04 MED ORDER — LOPERAMIDE HCL 2 MG PO CAPS
2.0000 mg | ORAL_CAPSULE | ORAL | Status: DC | PRN
  Administered 2024-01-04 (×2): 2 mg via ORAL
  Filled 2024-01-04 (×2): qty 1

## 2024-01-04 MED ORDER — POTASSIUM CHLORIDE 20 MEQ PO PACK
40.0000 meq | PACK | Freq: Once | ORAL | Status: AC
  Administered 2024-01-04: 40 meq via ORAL
  Filled 2024-01-04: qty 2

## 2024-01-04 MED ORDER — POTASSIUM CHLORIDE 10 MEQ/100ML IV SOLN
10.0000 meq | INTRAVENOUS | Status: AC
  Administered 2024-01-04 (×4): 10 meq via INTRAVENOUS
  Filled 2024-01-04 (×4): qty 100

## 2024-01-04 MED ORDER — POTASSIUM CHLORIDE IN NACL 20-0.9 MEQ/L-% IV SOLN
INTRAVENOUS | Status: AC
  Filled 2024-01-04 (×2): qty 1000

## 2024-01-04 MED ORDER — IPRATROPIUM-ALBUTEROL 0.5-2.5 (3) MG/3ML IN SOLN: 3.0000 mL | RESPIRATORY_TRACT | Status: DC | PRN

## 2024-01-04 MED ORDER — PNEUMOCOCCAL 20-VAL CONJ VACC 0.5 ML IM SUSY
0.5000 mL | PREFILLED_SYRINGE | INTRAMUSCULAR | Status: AC
  Administered 2024-01-05: 0.5 mL via INTRAMUSCULAR
  Filled 2024-01-04: qty 0.5

## 2024-01-04 NOTE — Progress Notes (Signed)
 PROGRESS NOTE  Mark [name] FMW:[id number] DOB: 10/09/1950   PCP: Campbell Reynolds, NP  Patient is from: Home.  DOA: 01/02/2024 LOS: 1  Chief complaints Chief Complaint  Patient presents with   Vomiting   Knee Pain     Brief Narrative / Interim history: 73 year old M with PMH of COPD, EtOH abuse, combined CHF, CAD, paroxysmal A-fib on Eliquis , HTN, BPH, frequent falls and tobacco use disorder brought to ED by EMS due to bilateral knee pain for 1 day, nausea, vomiting and diarrhea, and admitted with working diagnosis of AKI, lactic acidosis, possible infiltrative fluid within the posterior mediastinum concerning for esophageal perforation and left fifth rib fracture.  In ED,  WBC 10.1.  Hgb 16.4.  Cr 2.49 (baseline 0.8-0.9).  CO2 21 with AG of 22.  BUN 16.lactic acid 4.8 but resolved quickly. RVP negative.  CK 142.   EtOH <15.  CXR concerning for acute appearing left fifth rib fracture.  CT raise concern for hiatal hernia and interval development of normal opacified infiltrative fluid within the posterior mediastinum of unclear etiology.  CT chest without contrast redemonstrated mild circumferential wall thickening in lower thoracic esophagus with associated paraesophageal free fluid in the posterior lower mediastinum, which appears mildly increased in volume since CT abdomen and pelvis.  Per EDP, on-call cardiothoracic surgeon, Dr. Sherrine consulted and recommended CT scan with oral contrast versus esophagram to confirm diagnosis.   Subjective: Seen and examined earlier this morning.  No major events overnight or this morning.  He is sleepy but wakes to voice.  He is oriented to self.  He thinks he is at home.  Objective: Vitals:   01/04/24 0500 01/04/24 0525 01/04/24 0758 01/04/24 0900  BP:   130/81   Pulse: (!) 117 84 97 (!) 103  Resp:   20   Temp:   97.6 F (36.4 C)   TempSrc:   Oral   SpO2:   98%   Weight:      Height:        Examination:  GENERAL: No apparent distress.   Nontoxic. HEENT: MMM.  Vision and hearing grossly intact.  NECK: Supple.  No apparent JVD.  RESP:  No IWOB.  Fair aeration bilaterally. CVS:  RRR. Heart sounds normal.  ABD/GI/GU: BS+. Abd soft, NTND.  MSK/EXT:  Moves extremities.  Blisters over both knees.  FROM in both knees SKIN: no apparent skin lesion or wound NEURO: Sleepy but wakes to voice.  Oriented to self.  Follows commands.  No apparent focal neuro deficit. PSYCH: Calm. Normal affect.   Consultants:  Cardiothoracic surgery   Procedures: None  Microbiology summarized: COVID-19, influenza and RSV PCR nonreactive Blood cultures NGTD  Assessment and plan: AKI: Baseline Cr 0.8-0.9.  Likely prerenal from GI loss in the setting of nausea vomiting and diarrhea.  Also Lasix  and losartan  at home.  No obstruction on CT.  AKI improved. Recent Labs    05/08/23 0523 06/22/23 0546 07/29/23 0345 09/23/23 2120 10/19/23 1416 12/16/23 0927 12/17/23 0418 01/02/24 2100 01/03/24 0456 01/04/24 0358  BUN 9 8 11 11 10 8 9 16 20 14   CREATININE 0.83 1.08 1.00 1.50* 0.84 0.89 0.93 2.49* 2.29* 1.26*  - Continue IV fluid - Strict intake and output  Paraesophageal fluid collection: Noted on CT chest and abdomen without contrast.  Patient came in with nausea and vomiting.  Some question about possible esophageal perforation. -Continue n.p.o. -Esophagram -Continue ceftriaxone  and Flagyl  -Follow cardiothoracic surgery recommendation  Alcohol abuse/withdrawal: Elevated CIWA to 16  earlier this morning but improved to 3.  He was sleepy during my evaluation without withdrawal symptoms.  He is only oriented to self.  He thinks he is at home.  Nursing staff discovered 1/2 bottle of vodka in the room under his bed earlier which was removed. -Continue CIWA with as needed Ativan  -Multivitamin, thiamine  and folic acid  -TOC consult  Chronic combined CHF: TTE in 04/2023 with LVEF of 30 to 35%, GH, G1 DD.  Appears euvolemic on exam.  No  cardiopulmonary distress. - Hold diuretics and ARB in the setting of AKI - Strict intake and output, daily weight, renal functions and electrolytes   Chronic COPD: Stable -Bronchodilators as needed - Encourage smoking cessation - Nicotine  patch  Paroxysmal A-fib: Currently in sinus rhythm.  On Toprol -XL and Eliquis  at home.  Reportedly had recurrent fall.  Not sure if he is a candidate for anticoagulation given his recurrent fall. -Hold Eliquis  pending esophagram -IV metoprolol  5 mg every 8 hours while NPO -Optimize electrolytes  Acute left fifth rib fracture and healing left 3rd through 7th rib fractures: Likely from multiple falls. - Pain control  Hypokalemia/hypomagnesemia -Monitor replenish as appropriate  Generalized weakness/recurrent falls -Fall precaution -PT/OT eval  Lactic acidosis: Likely from alcohol.  Resolved.  Body mass index is 20.27 kg/m.           DVT prophylaxis:  SCDs Start: 01/03/24 0135  Code Status: Full code Family Communication: None at bedside Level of care: Telemetry Medical Status is: Inpatient Remains inpatient appropriate because: AKI, paraesophageal fluid collection, alcohol withdrawal and recurrent weakness   Final disposition: To be determined   55 minutes with more than 50% spent in reviewing records, counseling patient/family and coordinating care.   Sch Meds:  Scheduled Meds:  folic acid   1 mg Oral Daily   metoprolol  tartrate  5 mg Intravenous Q8H   multivitamin with minerals  1 tablet Oral Daily   nicotine   14 mg Transdermal Daily   [START ON 01/05/2024] pneumococcal 20-valent conjugate vaccine  0.5 mL Intramuscular Tomorrow-1000   thiamine   100 mg Oral Daily   Or   thiamine   100 mg Intravenous Daily   Continuous Infusions:  0.9 % NaCl with KCl 20 mEq / L 75 mL/hr at 01/04/24 1003   cefTRIAXone  (ROCEPHIN )  IV 2 g (01/04/24 0519)   metronidazole  500 mg (01/04/24 0859)   PRN Meds:.acetaminophen  **OR** acetaminophen ,  fentaNYL  (SUBLIMAZE ) injection, ipratropium-albuterol , LORazepam  **OR** LORazepam , naLOXone (NARCAN)  injection, ondansetron  (ZOFRAN ) IV  Antimicrobials: Anti-infectives (From admission, onward)    Start     Dose/Rate Route Frequency Ordered Stop   01/03/24 1000  metroNIDAZOLE  (FLAGYL ) IVPB 500 mg        500 mg 100 mL/hr over 60 Minutes Intravenous Every 12 hours 01/03/24 0139     01/03/24 0600  cefTRIAXone  (ROCEPHIN ) 2 g in sodium chloride  0.9 % 100 mL IVPB        2 g 200 mL/hr over 30 Minutes Intravenous Every 24 hours 01/03/24 0139     01/03/24 0300  piperacillin-tazobactam (ZOSYN) IVPB 3.375 g        3.375 g 100 mL/hr over 30 Minutes Intravenous  Once 01/03/24 0255 01/03/24 0424   01/02/24 2200  ceFEPIme  (MAXIPIME ) 2 g in sodium chloride  0.9 % 100 mL IVPB        2 g 200 mL/hr over 30 Minutes Intravenous  Once 01/02/24 2146 01/02/24 2341   01/02/24 2200  metroNIDAZOLE  (FLAGYL ) IVPB 500 mg  500 mg 100 mL/hr over 60 Minutes Intravenous  Once 01/02/24 2146 01/03/24 0053   01/02/24 2200  vancomycin  (VANCOCIN ) IVPB 1000 mg/200 mL premix        1,000 mg 200 mL/hr over 60 Minutes Intravenous  Once 01/02/24 2146 01/03/24 0000        I have personally reviewed the following labs and images: CBC: Recent Labs  Lab 01/02/24 2100 01/03/24 0456 01/04/24 0358  WBC 10.1 9.0 8.2  NEUTROABS  --  6.8  --   HGB 16.4 13.7 12.7*  HCT 49.2 39.3 37.0*  MCV 102.3* 96.6 98.1  PLT 332 252 226   BMP &GFR Recent Labs  Lab 01/02/24 2100 01/03/24 0456 01/04/24 0358  NA 134* 134* 136  K 3.5 3.4* 3.1*  CL 91* 94* 99  CO2 21* 23 24  GLUCOSE 157* 116* 74  BUN 16 20 14   CREATININE 2.49* 2.29* 1.26*  CALCIUM  9.5 8.5* 8.5*  MG  --  1.2* 1.7   Estimated Creatinine Clearance: 52.3 mL/min (A) (by C-G formula based on SCr of 1.26 mg/dL (H)). Liver & Pancreas: Recent Labs  Lab 01/02/24 2100 01/03/24 0456 01/04/24 0358  AST 51* 32 30  ALT 32 22 20  ALKPHOS 184* 136* 119  BILITOT  2.2* 1.8* 1.3*  PROT 8.8* 7.1 6.4*  ALBUMIN 4.2 3.3* 2.8*   Recent Labs  Lab 01/02/24 2100  LIPASE 32   No results for input(s): AMMONIA in the last 168 hours. Diabetic: No results for input(s): HGBA1C in the last 72 hours. No results for input(s): GLUCAP in the last 168 hours. Cardiac Enzymes: Recent Labs  Lab 01/02/24 2219  CKTOTAL 142   No results for input(s): PROBNP in the last 8760 hours. Coagulation Profile: Recent Labs  Lab 01/02/24 2245  INR 1.3*   Thyroid  Function Tests: No results for input(s): TSH, T4TOTAL, FREET4, T3FREE, THYROIDAB in the last 72 hours. Lipid Profile: No results for input(s): CHOL, HDL, LDLCALC, TRIG, CHOLHDL, LDLDIRECT in the last 72 hours. Anemia Panel: No results for input(s): VITAMINB12, FOLATE, FERRITIN, TIBC, IRON, RETICCTPCT in the last 72 hours. Urine analysis:    Component Value Date/Time   COLORURINE AMBER (A) 01/03/2024 1059   APPEARANCEUR HAZY (A) 01/03/2024 1059   LABSPEC 1.024 01/03/2024 1059   PHURINE 5.0 01/03/2024 1059   GLUCOSEU NEGATIVE 01/03/2024 1059   HGBUR NEGATIVE 01/03/2024 1059   BILIRUBINUR NEGATIVE 01/03/2024 1059   KETONESUR 5 (A) 01/03/2024 1059   PROTEINUR 30 (A) 01/03/2024 1059   NITRITE NEGATIVE 01/03/2024 1059   LEUKOCYTESUR NEGATIVE 01/03/2024 1059   Sepsis Labs: Invalid input(s): PROCALCITONIN, LACTICIDVEN  Microbiology: Recent Results (from the past 240 hours)  Blood Culture (routine x 2)     Status: None (Preliminary result)   Collection Time: 01/02/24 10:17 PM   Specimen: BLOOD  Result Value Ref Range Status   Specimen Description   Final    BLOOD LEFT ANTECUBITAL Performed at [facility], [address], [place], [place] [address]    Special Requests   Final    BOTTLES DRAWN AEROBIC AND ANAEROBIC Blood Culture results may not be optimal due to an inadequate volume of blood received in culture bottles Performed at [facility], [address] [place], [place] [address]    Culture   Final    NO GROWTH 1 DAY Performed at [facility], [address] [place], [place] [address]    Report Status PENDING  Incomplete  Blood Culture (  routine x 2)     Status: None (Preliminary result)   Collection Time: 01/02/24 10:33 PM   Specimen: BLOOD  Result Value Ref Range Status   Specimen Description   Final    BLOOD RIGHT ANTECUBITAL Performed at [facility], [address] [place], [place] [address]    Special Requests   Final    BOTTLES DRAWN AEROBIC AND ANAEROBIC Blood Culture results may not be optimal due to an inadequate volume of blood received in culture bottles Performed at [facility], [address] [place], [place] [address]    Culture   Final    NO GROWTH 1 DAY Performed at [facility], [address] [place], [place] [address]    Report Status PENDING  Incomplete  Resp panel by RT-PCR (RSV, Flu A&B, Covid) Anterior Nasal Swab     Status: None   Collection Time: 01/02/24 10:45 PM   Specimen: Anterior Nasal Swab  Result Value Ref Range Status   SARS Coronavirus 2 by RT PCR NEGATIVE NEGATIVE Final    Comment: (NOTE) SARS-CoV-2 target nucleic acids are NOT DETECTED.  The SARS-CoV-2 RNA is generally detectable in upper respiratory specimens during the acute phase of infection. The lowest concentration of SARS-CoV-2 viral copies this assay can detect is 138 copies/mL. A negative result does not preclude SARS-Cov-2 infection and should not be used as the sole basis for treatment or other patient management decisions. A negative result may occur with  improper specimen collection/handling, submission of specimen other than nasopharyngeal swab, presence of viral mutation(s) within the areas targeted by this assay, and inadequate number of viral copies(<138 copies/mL). A negative result must be combined with clinical  observations, patient history, and epidemiological information. The expected result is Negative.  Fact Sheet for Patients:  BloggerCourse.com  Fact Sheet for Healthcare Providers:  SeriousBroker.it  This test is no t yet approved or cleared by the [place]  FDA and  has been authorized for detection and/or diagnosis of SARS-CoV-2 by FDA under an Emergency Use Authorization (EUA). This EUA will remain  in effect (meaning this test can be used) for the duration of the COVID-19 declaration under Section 564(b)(1) of the Act, 21 U.S.C.section 360bbb-3(b)(1), unless the authorization is terminated  or revoked sooner.       Influenza A by PCR NEGATIVE NEGATIVE Final   Influenza B by PCR NEGATIVE NEGATIVE Final    Comment: (NOTE) The Xpert Xpress SARS-CoV-2/FLU/RSV plus assay is intended as an aid in the diagnosis of influenza from Nasopharyngeal swab specimens and should not be used as a sole basis for treatment. Nasal washings and aspirates are unacceptable for Xpert Xpress SARS-CoV-2/FLU/RSV testing.  Fact Sheet for Patients: BloggerCourse.com  Fact Sheet for Healthcare Providers: SeriousBroker.it  This test is not yet approved or cleared by the [place]  FDA and has been authorized for detection and/or diagnosis of SARS-CoV-2 by FDA under an Emergency Use Authorization (EUA). This EUA will remain in effect (meaning this test can be used) for the duration of the COVID-19 declaration under Section 564(b)(1) of the Act, 21 U.S.C. section 360bbb-3(b)(1), unless the authorization is terminated or revoked.     Resp Syncytial Virus by PCR NEGATIVE NEGATIVE Final    Comment: (NOTE) Fact Sheet for Patients: BloggerCourse.com  Fact Sheet for Healthcare Providers: SeriousBroker.it  This test is not yet approved or cleared by  the [place]  FDA and has been authorized for detection and/or diagnosis of SARS-CoV-2 by FDA under  an Emergency Use Authorization (EUA). This EUA will remain in effect (meaning this test can be used) for the duration of the COVID-19 declaration under Section 564(b)(1) of the Act, 21 U.S.C. section 360bbb-3(b)(1), unless the authorization is terminated or revoked.  Performed at [facility], [address] [place], [place] [address]     Radiology Studies: No results found.    Fumiko Cham T. Markevion Lattin [facility]  If 7PM-7AM, please contact night-coverage [email or web address] 01/04/2024, 12:18 PM

## 2024-01-05 DIAGNOSIS — R7989 Other specified abnormal findings of blood chemistry: Secondary | ICD-10-CM | POA: Diagnosis not present

## 2024-01-05 DIAGNOSIS — E876 Hypokalemia: Secondary | ICD-10-CM | POA: Diagnosis not present

## 2024-01-05 DIAGNOSIS — N179 Acute kidney failure, unspecified: Secondary | ICD-10-CM | POA: Diagnosis not present

## 2024-01-05 LAB — COMPREHENSIVE METABOLIC PANEL WITH GFR
ALT: 18 U/L (ref 0–44)
AST: 24 U/L (ref 15–41)
Albumin: 2.8 g/dL — ABNORMAL LOW (ref 3.5–5.0)
Alkaline Phosphatase: 115 U/L (ref 38–126)
Anion gap: 13 (ref 5–15)
BUN: 11 mg/dL (ref 8–23)
CO2: 19 mmol/L — ABNORMAL LOW (ref 22–32)
Calcium: 8.9 mg/dL (ref 8.9–10.3)
Chloride: 105 mmol/L (ref 98–111)
Creatinine, Ser: 0.98 mg/dL (ref 0.61–1.24)
GFR, Estimated: >60 mL/min (ref >60)
Glucose, Bld: 77 mg/dL (ref 70–99)
Potassium: 3.7 mmol/L (ref 3.5–5.1)
Sodium: 137 mmol/L (ref 135–145)
Total Bilirubin: 0.8 mg/dL (ref 0.0–1.2)
Total Protein: 6.3 g/dL — ABNORMAL LOW (ref 6.5–8.1)

## 2024-01-05 LAB — CBC
HCT: 36.5 % — ABNORMAL LOW (ref 39.0–52.0)
Hemoglobin: 12.3 g/dL — ABNORMAL LOW (ref 13.0–17.0)
MCH: 33.5 pg (ref 26.0–34.0)
MCHC: 33.7 g/dL (ref 30.0–36.0)
MCV: 99.5 fL (ref 80.0–100.0)
Platelets: 184 10*3/uL (ref 150–400)
RBC: 3.67 MIL/uL — ABNORMAL LOW (ref 4.22–5.81)
RDW: 13.2 % (ref 11.5–15.5)
WBC: 7.6 10*3/uL (ref 4.0–10.5)
nRBC: 0 % (ref 0.0–0.2)

## 2024-01-05 LAB — MAGNESIUM: Magnesium: 1.6 mg/dL — ABNORMAL LOW (ref 1.7–2.4)

## 2024-01-05 LAB — PHOSPHORUS: Phosphorus: 2.4 mg/dL — ABNORMAL LOW (ref 2.5–4.6)

## 2024-01-05 LAB — CK: Total CK: 73 U/L (ref 49–397)

## 2024-01-05 MED ORDER — PANTOPRAZOLE SODIUM 40 MG PO TBEC
40.0000 mg | DELAYED_RELEASE_TABLET | Freq: Every day | ORAL | Status: DC
  Administered 2024-01-05 – 2024-01-07 (×3): 40 mg via ORAL
  Filled 2024-01-05 (×3): qty 1

## 2024-01-05 MED ORDER — LOSARTAN POTASSIUM 25 MG PO TABS
25.0000 mg | ORAL_TABLET | Freq: Every morning | ORAL | Status: DC
  Administered 2024-01-05 – 2024-01-06 (×2): 25 mg via ORAL
  Filled 2024-01-05 (×2): qty 1

## 2024-01-05 MED ORDER — METOPROLOL SUCCINATE ER 100 MG PO TB24
100.0000 mg | ORAL_TABLET | Freq: Every day | ORAL | Status: DC
  Administered 2024-01-05 – 2024-01-07 (×3): 100 mg via ORAL
  Filled 2024-01-05 (×3): qty 1

## 2024-01-05 MED ORDER — POTASSIUM CHLORIDE 20 MEQ PO PACK
40.0000 meq | PACK | Freq: Once | ORAL | Status: AC
  Administered 2024-01-05: 40 meq via ORAL
  Filled 2024-01-05: qty 2

## 2024-01-05 MED ORDER — MAGNESIUM SULFATE 2 GM/50ML IV SOLN
2.0000 g | Freq: Once | INTRAVENOUS | Status: AC
  Administered 2024-01-05: 2 g via INTRAVENOUS
  Filled 2024-01-05: qty 50

## 2024-01-05 MED ORDER — FLUTICASONE FUROATE-VILANTEROL 200-25 MCG/ACT IN AEPB
1.0000 | INHALATION_SPRAY | Freq: Every day | RESPIRATORY_TRACT | Status: DC
  Administered 2024-01-06 – 2024-01-07 (×2): 1 via RESPIRATORY_TRACT
  Filled 2024-01-05: qty 28

## 2024-01-05 MED ORDER — HYDRALAZINE HCL 25 MG PO TABS: 25.0000 mg | ORAL_TABLET | Freq: Four times a day (QID) | ORAL | Status: DC | PRN

## 2024-01-05 MED ORDER — APIXABAN 5 MG PO TABS
5.0000 mg | ORAL_TABLET | Freq: Two times a day (BID) | ORAL | Status: DC
  Administered 2024-01-05 – 2024-01-07 (×4): 5 mg via ORAL
  Filled 2024-01-05 (×4): qty 1

## 2024-01-05 NOTE — Plan of Care (Signed)

## 2024-01-05 NOTE — Evaluation (Addendum)
 Occupational Therapy Evaluation Patient Details Name: Mark [name] MRN: [id number] DOB: 23-Nov-1950 Today's Date: 01/05/2024   History of Present Illness   Pt is a 73 y.o male admitted for Bil knee pain, nausea, & vomiting. Imaging showed L 5th rib fx.  Riding left knee x-ray showing mild prepatellar soft tissue swelling without fracture or dislocation bil. PMH: CAD, CHF, etoh abuse, afib     Clinical Impressions Pt admitted based on above, and was seen based on problem list below. PTA pt was independent with ADLs, but reporting a history of falls when completing IADLs. Today pt is requiring set up  to mod assist for ADLs. Noted decreased R visual field and inattention. Pt with decreased bil strength R>L. Pt with decreased sensation impacting ability to complete self-feeding, especially with utensils. Bed mobility and functional transfers are up to min assist with use of walker. Pt only tolerating short mobility, d/t bil knee pain. Pt lives alone, and with limited assistance available upon d/c. Pt would benefit from <3 hours of skilled rehab daily. OT will continue to follow acutely to maximize functional independence.     If plan is discharge home, recommend the following:   A little help with walking and/or transfers;A little help with bathing/dressing/bathroom;Assistance with cooking/housework;Direct supervision/assist for medications management;Direct supervision/assist for financial management;Assist for transportation;Help with stairs or ramp for entrance     Functional Status Assessment   Patient has had a recent decline in their functional status and demonstrates the ability to make significant improvements in function in a reasonable and predictable amount of time.     Equipment Recommendations   Other (comment) (Defer to next venue)     Recommendations for Other Services         Precautions/Restrictions   Precautions Precautions: Fall Recall of  Precautions/Restrictions: Intact Restrictions Weight Bearing Restrictions Per Provider Order: No     Mobility Bed Mobility Overal bed mobility: Needs Assistance Bed Mobility: Supine to Sit, Sit to Supine     Supine to sit: Min assist Sit to supine: Contact guard assist   General bed mobility comments: Min A at LE and trunk with increased time    Transfers Overall transfer level: Needs assistance Equipment used: Rollator (4 wheels) Transfers: Sit to/from Stand, Bed to chair/wheelchair/BSC Sit to Stand: Contact guard assist     Step pivot transfers: Contact guard assist     General transfer comment: CGA for balance      Balance Overall balance assessment: Needs assistance Sitting-balance support: Feet supported Sitting balance-Leahy Scale: Good     Standing balance support: During functional activity, Bilateral upper extremity supported, Reliant on assistive device for balance Standing balance-Leahy Scale: Poor Standing balance comment: Reliant on AD     ADL either performed or assessed with clinical judgement   ADL Overall ADL's : Needs assistance/impaired Eating/Feeding: Moderate assistance;Sitting Eating/Feeding Details (indicate cue type and reason): Assist to grasp spoon and get food on utensil, pt able to bring hand to mouth Grooming: Minimal assistance;Sitting   Upper Body Bathing: Minimal assistance;Sitting   Lower Body Bathing: Moderate assistance;Sit to/from stand Lower Body Bathing Details (indicate cue type and reason): Assist to reach BLEs Upper Body Dressing : Minimal assistance;Sitting   Lower Body Dressing: Moderate assistance;Sit to/from stand Lower Body Dressing Details (indicate cue type and reason): Assist to thread BLEs and pull above waist Toilet Transfer: Contact guard assist;Ambulation;Rolling walker (2 wheels) Toilet Transfer Details (indicate cue type and reason): For short distance, simulated in room Toileting- Architect  and Hygiene: Minimal assistance;Sit to/from stand Toileting - Clothing Manipulation Details (indicate cue type and reason): Assist to steady in standing     Functional mobility during ADLs: Contact guard assist;Rolling walker (2 wheels) General ADL Comments: Pt limited by decreased sensation and pain in bil knees     Vision Ability to See in Adequate Light: 0 Adequate Patient Visual Report: Peripheral vision impairment Vision Assessment?: Yes Eye Alignment: Within Functional Limits Alignment/Gaze Preference: Chin down Tracking/Visual Pursuits: Decreased smoothness of horizontal tracking;Decreased smoothness of eye movement to RIGHT superior field;Decreased smoothness of eye movement to LEFT superior field;Requires cues, head turns, or add eye shifts to track Visual Fields: Right visual field deficit     Perception Perception: Impaired Preception Impairment Details: Inattention/Neglect Perception-Other Comments: Decreased attention and tracking to R side       Pertinent Vitals/Pain Pain Assessment Pain Assessment: Faces Faces Pain Scale: Hurts even more Pain Location: bil knees Pain Descriptors / Indicators: Discomfort, Grimacing, Guarding Pain Intervention(s): Limited activity within patient's tolerance     Extremity/Trunk Assessment Upper Extremity Assessment Upper Extremity Assessment: RUE deficits/detail;LUE deficits/detail;Right hand dominant RUE Deficits / Details: Signficiantly decreased grip strength and sensation in hands. Biceps 4/5, shoulder flex 3+/5 MMT, ROM WFL. Pt reports since CVA sensation fluctuates RUE Sensation: decreased light touch RUE Coordination: decreased fine motor LUE Deficits / Details: Decreased grip strength, Biceps 4/5, shoulder flex 3+/5 MMT, ROM WFL LUE Coordination: decreased fine motor   Lower Extremity Assessment Lower Extremity Assessment: Defer to PT evaluation   Cervical / Trunk Assessment Cervical / Trunk Assessment: Kyphotic    Communication Communication Communication: Impaired Factors Affecting Communication: Difficulty expressing self;Reduced clarity of speech   Cognition Arousal: Alert Behavior During Therapy: WFL for tasks assessed/performed Cognition: No family/caregiver present to determine baseline     OT - Cognition Comments: Decreased STM, difficulty expressing self, poor comprehension and safety awareness, potential baseline from CVA     Following commands: Intact       Cueing  General Comments   Cueing Techniques: Verbal cues  blisters on bil knees           Home Living Family/patient expects to be discharged to:: Private residence Living Arrangements: Alone Available Help at Discharge: Friend(s);Available PRN/intermittently;Family (son can help intermittently is a Museum/gallery conservator.) Type of Home: Apartment Home Access: Stairs to enter [employer] of Steps: 2 Entrance Stairs-Rails: None Home Layout: One level     Bathroom Shower/Tub: Tub/shower unit;Sponge bathes at baseline   [employer]: Standard     Home Equipment: Agricultural consultant (2 wheels);Rollator (4 wheels)          Prior Functioning/Environment Prior Level of Function : Needs assist;History of Falls (last six months)             Mobility Comments: uses rollator in the community, reports 10 falls within last 6 months ADLs Comments: Pt sponge bathes; reports shower is broken. Uses the bus for transportation. Pt states that he manages his own medications, pt reports can indep self feed finger food, difficulty with utensils    OT Problem List: Decreased strength;Decreased range of motion;Decreased activity tolerance;Impaired balance (sitting and/or standing);Decreased safety awareness;Decreased cognition;Impaired UE functional use;Cardiopulmonary status limiting activity   OT Treatment/Interventions: Self-care/ADL training;Therapeutic exercise;DME and/or AE instruction;Energy  conservation;Therapeutic activities;Patient/family education;Balance training      OT Goals(Current goals can be found in the care plan section)   Acute Rehab OT Goals Patient Stated Goal: To go home OT Goal Formulation: With patient Time  For Goal Achievement: 01/19/24 Potential to Achieve Goals: Good ADL Goals Pt Will Perform Eating: with set-up;with adaptive utensils;sitting Pt Will Perform Grooming: with set-up;sitting Pt Will Perform Upper Body Dressing: with set-up;sitting Pt Will Perform Lower Body Dressing: with contact guard assist;sit to/from stand Pt Will Transfer to Toilet: with contact guard assist;ambulating;regular height toilet   OT Frequency:  Min 2X/week    Co-evaluation   Reason for Co-Treatment: For patient/therapist safety;To address functional/ADL transfers PT goals addressed during session: Mobility/safety with mobility;Balance;Proper use of DME OT goals addressed during session: ADL's and self-care;Strengthening/ROM      AM-PAC OT 6 Clicks Daily Activity     Outcome Measure Help from another person eating meals?: A Lot Help from another person taking care of personal grooming?: A Little Help from another person toileting, which includes using toliet, bedpan, or urinal?: A Lot Help from another person bathing (including washing, rinsing, drying)?: A Lot Help from another person to put on and taking off regular upper body clothing?: A Little Help from another person to put on and taking off regular lower body clothing?: A Lot 6 Click Score: 14   End of Session Equipment Utilized During Treatment: Gait belt;Rolling walker (2 wheels) Nurse Communication: Mobility status (Self-feeding status)  Activity Tolerance: Patient limited by pain Patient left: in bed;with call bell/phone within reach;with bed alarm set  OT Visit Diagnosis: Unsteadiness on feet (R26.81);Other abnormalities of gait and mobility (R26.89);Muscle weakness (generalized)  (M62.81);History of falling (Z91.81);Repeated falls (R29.6);Feeding difficulties (R63.3)                Time: 8764-8681 OT Time Calculation (min): 43 min Charges:  OT General Charges $OT Visit: 1 Visit OT Evaluation $OT Eval Moderate Complexity: 1 Mod OT Treatments $Self Care/Home Management : 8-22 mins  Adrianne BROCKS, OT  Acute Rehabilitation Services Office [phone] Secure chat preferred   Adrianne GORMAN Savers 01/05/2024, 3:15 PM

## 2024-01-05 NOTE — Progress Notes (Signed)
 PROGRESS NOTE  Mark [name] FMW:[id number] DOB: 1951/03/24   PCP: Campbell Reynolds, NP  Patient is from: Home.  Lives alone.  Uses rollator at baseline.  DOA: 01/02/2024 LOS: 2  Chief complaints Chief Complaint  Patient presents with   Vomiting   Knee Pain     Brief Narrative / Interim history: 73 year old M with PMH of COPD, EtOH abuse, combined CHF, CAD, paroxysmal A-fib on Eliquis , HTN, BPH, frequent falls and tobacco use disorder brought to ED by EMS due to bilateral knee pain for 1 day, nausea, vomiting and diarrhea, and admitted with working diagnosis of AKI, lactic acidosis, possible infiltrative fluid within the posterior mediastinum concerning for esophageal perforation and left fifth rib fracture.  In ED,  WBC 10.1.  Hgb 16.4.  Cr 2.49 (baseline 0.8-0.9).  CO2 21 with AG of 22.  BUN 16.lactic acid 4.8 but resolved quickly. RVP negative.  CK 142.   EtOH <15.  CXR concerning for acute appearing left fifth rib fracture.  CT abdomen and pelvis raised concern for hiatal hernia and interval development of normal opacified infiltrative fluid within the posterior mediastinum of unclear etiology.   Per EDP, CTS recommended CT scan with oral contrast versus esophagram to confirm diagnosis. CT chest redemonstrated mild circumferential wall thickening in lower thoracic esophagus with associated and slightly increased paraesophageal free fluid in the posterior lower mediastinum but CT obtained without oral contrast.    The next day, esophagram negative for esophageal perforation.  Started on soft diet.  Subjective: Seen and examined earlier this morning.  No major events overnight or this morning. Feels hungry.  Complains of bilateral knee pain.  Awake and alert but only oriented to self and place.  Objective: Vitals:   01/04/24 1940 01/05/24 0003 01/05/24 0412 01/05/24 0814  BP: (!) 132/100 (!) 150/90 (!) 165/104 (!) 180/96  Pulse: 92 94 89 90  Resp: 18 18 18 19   Temp: 98.3 F (36.8 C)  98.1 F (36.7 C) 98.2 F (36.8 C) 97.8 F (36.6 C)  TempSrc: Oral Oral Axillary Oral  SpO2: 95% 95% 94%   Weight:   68.7 kg   Height:        Examination:  GENERAL: No apparent distress.  Nontoxic. HEENT: MMM.  Vision and hearing grossly intact.  NECK: Supple.  No apparent JVD.  RESP:  No IWOB.  Fair aeration bilaterally. CVS:  RRR. Heart sounds normal.  ABD/GI/GU: BS+. Abd soft, NTND.  MSK/EXT:  Moves extremities.  Blisters over both knees.  FROM in both knees.  SKIN: no apparent skin lesion or wound NEURO: AA.  Oriented to self and place.  Follows commands.  LUE resting tremor.  No apparent focal neuro deficit. PSYCH: Calm. Normal affect.   Consultants:  Cardiothoracic surgery   Procedures: None  Microbiology summarized: COVID-19, influenza and RSV PCR nonreactive Blood cultures NGTD  Assessment and plan: AKI: Baseline Cr 0.8-0.9.  Likely prerenal from GI loss in the setting of nausea vomiting and diarrhea.  Also Lasix  and losartan  at home.  No obstruction on CT.  AKI resolved. Recent Labs    06/22/23 0546 07/29/23 0345 09/23/23 2120 10/19/23 1416 12/16/23 0927 12/17/23 0418 01/02/24 2100 01/03/24 0456 01/04/24 0358 01/05/24 0341  BUN 8 11 11 10 8 9 16 20 14 11   CREATININE 1.08 1.00 1.50* 0.84 0.89 0.93 2.49* 2.29* 1.26* 0.98  -Monitor off IV fluid.  Paraesophageal fluid collection? Noted on CT chest and abdomen without contrast.  Presents with N/V.  Some question about possible  esophageal perforation but esophagram negative. -Started soft diet -Discontinue antibiotic -Start p.o. Protonix    Alcohol abuse/withdrawal: Nursing staff discovered 1/2 bottle of vodka in the room under his bed earlier which was removed. Elevated CIWA to 16 after midnight but improved to 4 later.  No withdrawal symptoms other than resting LUE tremor.  -Continue CIWA with as needed Ativan  -Multivitamin, thiamine  and folic acid  -TOC consult  Chronic combined CHF: TTE in 04/2023  with LVEF of 30 to 35%, GH, G1 DD.  Appears euvolemic on exam.  No cardiopulmonary distress.  BP elevated -Continue holding diuretics. -Resume home Toprol -XL and losartan . -Strict intake and output, daily weight, renal functions and electrolytes  Essential hypertension: Significantly elevated BP this morning.  Antihypertensive meds were on hold. - Resume home Toprol -XL and losartan  - P.o. hydralazine  as needed  Paroxysmal A-fib: Currently in sinus rhythm.  On Toprol -XL and Eliquis  at home.  Reportedly had recurrent fall.  Not sure if he is a candidate for anticoagulation given his recurrent fall. - Resume home Toprol -XL. -Continue Eliquis  in house.  Won't  continue at discharge unless he goes to rehab due to fall risk -Optimize electrolytes  Acute left fifth rib fracture and healing left 3rd through 7th rib fractures: Likely from multiple falls. - Pain control  Chronic COPD: Stable -Bronchodilators as needed -Encourage smoking cessation -Nicotine  patch  Hypokalemia/hypomagnesemia -Monitor replenish as appropriate  Generalized weakness/recurrent falls -Fall precaution -PT/OT eval  Lactic acidosis: Likely from alcohol.  Resolved.  Body mass index is 20.54 kg/m.           DVT prophylaxis:  SCDs Start: 01/03/24 0135 apixaban  (ELIQUIS ) tablet 5 mg  Code Status: Full code Family Communication: None at bedside Level of care: Telemetry Medical Status is: Inpatient Remains inpatient appropriate because: AKI, alcohol withdrawal and recurrent fall   Final disposition: To be determined after evaluation by therapy   55 minutes with more than 50% spent in reviewing records, counseling patient/family and coordinating care.   Sch Meds:  Scheduled Meds:  apixaban   5 mg Oral BID   fluticasone  furoate-vilanterol  1 puff Inhalation Daily   folic acid   1 mg Oral Daily   losartan   25 mg Oral q morning   metoprolol  succinate  100 mg Oral Daily   multivitamin with minerals  1  tablet Oral Daily   nicotine   14 mg Transdermal Daily   pantoprazole   40 mg Oral Daily   pneumococcal 20-valent conjugate vaccine  0.5 mL Intramuscular Tomorrow-1000   thiamine   100 mg Oral Daily   Or   thiamine   100 mg Intravenous Daily   Continuous Infusions:  cefTRIAXone  (ROCEPHIN )  IV 2 g (01/05/24 0630)   magnesium  sulfate bolus IVPB 2 g (01/05/24 1059)   metronidazole  500 mg (01/04/24 2142)   PRN Meds:.acetaminophen  **OR** acetaminophen , fentaNYL  (SUBLIMAZE ) injection, hydrALAZINE , ipratropium-albuterol , loperamide , LORazepam  **OR** LORazepam , naLOXone (NARCAN)  injection, ondansetron  (ZOFRAN ) IV  Antimicrobials: Anti-infectives (From admission, onward)    Start     Dose/Rate Route Frequency Ordered Stop   01/03/24 1000  metroNIDAZOLE  (FLAGYL ) IVPB 500 mg        500 mg 100 mL/hr over 60 Minutes Intravenous Every 12 hours 01/03/24 0139     01/03/24 0600  cefTRIAXone  (ROCEPHIN ) 2 g in sodium chloride  0.9 % 100 mL IVPB        2 g 200 mL/hr over 30 Minutes Intravenous Every 24 hours 01/03/24 0139     01/03/24 0300  piperacillin-tazobactam (ZOSYN) IVPB 3.375 g  3.375 g 100 mL/hr over 30 Minutes Intravenous  Once 01/03/24 0255 01/03/24 0424   01/02/24 2200  ceFEPIme  (MAXIPIME ) 2 g in sodium chloride  0.9 % 100 mL IVPB        2 g 200 mL/hr over 30 Minutes Intravenous  Once 01/02/24 2146 01/02/24 2341   01/02/24 2200  metroNIDAZOLE  (FLAGYL ) IVPB 500 mg        500 mg 100 mL/hr over 60 Minutes Intravenous  Once 01/02/24 2146 01/03/24 0053   01/02/24 2200  vancomycin  (VANCOCIN ) IVPB 1000 mg/200 mL premix        1,000 mg 200 mL/hr over 60 Minutes Intravenous  Once 01/02/24 2146 01/03/24 0000        I have personally reviewed the following labs and images: CBC: Recent Labs  Lab 01/02/24 2100 01/03/24 0456 01/04/24 0358 01/05/24 0341  WBC 10.1 9.0 8.2 7.6  NEUTROABS  --  6.8  --   --   HGB 16.4 13.7 12.7* 12.3*  HCT 49.2 39.3 37.0* 36.5*  MCV 102.3* 96.6 98.1 99.5   PLT 332 252 226 184   BMP &GFR Recent Labs  Lab 01/02/24 2100 01/03/24 0456 01/04/24 0358 01/05/24 0341  NA 134* 134* 136 137  K 3.5 3.4* 3.1* 3.7  CL 91* 94* 99 105  CO2 21* 23 24 19*  GLUCOSE 157* 116* 74 77  BUN 16 20 14 11   CREATININE 2.49* 2.29* 1.26* 0.98  CALCIUM  9.5 8.5* 8.5* 8.9  MG  --  1.2* 1.7 1.6*  PHOS  --   --   --  2.4*   Estimated Creatinine Clearance: 68.2 mL/min (by C-G formula based on SCr of 0.98 mg/dL). Liver & Pancreas: Recent Labs  Lab 01/02/24 2100 01/03/24 0456 01/04/24 0358 01/05/24 0341  AST 51* 32 30 24  ALT 32 22 20 18   ALKPHOS 184* 136* 119 115  BILITOT 2.2* 1.8* 1.3* 0.8  PROT 8.8* 7.1 6.4* 6.3*  ALBUMIN 4.2 3.3* 2.8* 2.8*   Recent Labs  Lab 01/02/24 2100  LIPASE 32   No results for input(s): AMMONIA in the last 168 hours. Diabetic: No results for input(s): HGBA1C in the last 72 hours. No results for input(s): GLUCAP in the last 168 hours. Cardiac Enzymes: Recent Labs  Lab 01/02/24 2219 01/05/24 0341  CKTOTAL 142 73   No results for input(s): PROBNP in the last 8760 hours. Coagulation Profile: Recent Labs  Lab 01/02/24 2245  INR 1.3*   Thyroid  Function Tests: No results for input(s): TSH, T4TOTAL, FREET4, T3FREE, THYROIDAB in the last 72 hours. Lipid Profile: No results for input(s): CHOL, HDL, LDLCALC, TRIG, CHOLHDL, LDLDIRECT in the last 72 hours. Anemia Panel: No results for input(s): VITAMINB12, FOLATE, FERRITIN, TIBC, IRON, RETICCTPCT in the last 72 hours. Urine analysis:    Component Value Date/Time   COLORURINE AMBER (A) 01/03/2024 1059   APPEARANCEUR HAZY (A) 01/03/2024 1059   LABSPEC 1.024 01/03/2024 1059   PHURINE 5.0 01/03/2024 1059   GLUCOSEU NEGATIVE 01/03/2024 1059   HGBUR NEGATIVE 01/03/2024 1059   BILIRUBINUR NEGATIVE 01/03/2024 1059   KETONESUR 5 (A) 01/03/2024 1059   PROTEINUR 30 (A) 01/03/2024 1059   NITRITE NEGATIVE 01/03/2024 1059   LEUKOCYTESUR  NEGATIVE 01/03/2024 1059   Sepsis Labs: Invalid input(s): PROCALCITONIN, LACTICIDVEN  Microbiology: Recent Results (from the past 240 hours)  Blood Culture (routine x 2)     Status: None (Preliminary result)   Collection Time: 01/02/24 10:17 PM   Specimen: BLOOD  Result Value Ref Range Status  Specimen Description   Final    BLOOD LEFT ANTECUBITAL Performed at [facility], [address] [place], [place] [address]    Special Requests   Final    BOTTLES DRAWN AEROBIC AND ANAEROBIC Blood Culture results may not be optimal due to an inadequate volume of blood received in culture bottles Performed at [facility], [address], [place], [place] [address]    Culture   Final    NO GROWTH 2 DAYS Performed at [facility], [address], [place], [place] [address]    Report Status PENDING  Incomplete  Blood Culture (routine x 2)     Status: None (Preliminary result)   Collection Time: 01/02/24 10:33 PM   Specimen: BLOOD  Result Value Ref Range Status   Specimen Description   Final    BLOOD RIGHT ANTECUBITAL Performed at [facility], [address], [place], [place] [address]    Special Requests   Final    BOTTLES DRAWN AEROBIC AND ANAEROBIC Blood Culture results may not be optimal due to an inadequate volume of blood received in culture bottles Performed at [facility], [address] [place], [place] [address]    Culture   Final    NO GROWTH 2 DAYS Performed at [facility], [address] [place], [place] [address]    Report Status PENDING  Incomplete  Resp panel by RT-PCR (RSV, Flu A&B, Covid) Anterior Nasal Swab     Status: None   Collection Time: 01/02/24 10:45 PM   Specimen: Anterior Nasal Swab  Result Value Ref Range Status   SARS Coronavirus 2 by RT PCR NEGATIVE NEGATIVE Final    Comment: (NOTE) SARS-CoV-2 target nucleic acids are NOT DETECTED.  The SARS-CoV-2 RNA is  generally detectable in upper respiratory specimens during the acute phase of infection. The lowest concentration of SARS-CoV-2 viral copies this assay can detect is 138 copies/mL. A negative result does not preclude SARS-Cov-2 infection and should not be used as the sole basis for treatment or other patient management decisions. A negative result may occur with  improper specimen collection/handling, submission of specimen other than nasopharyngeal swab, presence of viral mutation(s) within the areas targeted by this assay, and inadequate number of viral copies(<138 copies/mL). A negative result must be combined with clinical observations, patient history, and epidemiological information. The expected result is Negative.  Fact Sheet for Patients:  BloggerCourse.com  Fact Sheet for Healthcare Providers:  SeriousBroker.it  This test is no t yet approved or cleared by the [place]  FDA and  has been authorized for detection and/or diagnosis of SARS-CoV-2 by FDA under an Emergency Use Authorization (EUA). This EUA will remain  in effect (meaning this test can be used) for the duration of the COVID-19 declaration under Section 564(b)(1) of the Act, 21 U.S.C.section 360bbb-3(b)(1), unless the authorization is terminated  or revoked sooner.       Influenza A by PCR NEGATIVE NEGATIVE Final   Influenza B by PCR NEGATIVE NEGATIVE Final    Comment: (NOTE) The Xpert Xpress SARS-CoV-2/FLU/RSV plus assay is intended as an aid in the diagnosis of influenza from Nasopharyngeal swab specimens and should not be used as a sole basis for treatment. Nasal washings and aspirates are unacceptable for Xpert Xpress SARS-CoV-2/FLU/RSV testing.  Fact Sheet for Patients: BloggerCourse.com  Fact Sheet for Healthcare Providers: SeriousBroker.it  This test is not yet approved or cleared by the [place] and  has been authorized for detection and/or diagnosis of SARS-CoV-2 by FDA under an Emergency Use Authorization (EUA). This EUA will remain in effect (meaning this test can be used) for the duration of the COVID-19 declaration under Section 564(b)(1) of the Act, 21 U.S.C. section 360bbb-3(b)(1), unless the authorization is terminated or revoked.     Resp Syncytial Virus by PCR NEGATIVE NEGATIVE Final    Comment: (NOTE) Fact Sheet for Patients: BloggerCourse.com  Fact Sheet for Healthcare Providers: SeriousBroker.it  This test is not yet approved or cleared by the [place]  FDA and has been authorized for detection and/or diagnosis of SARS-CoV-2 by FDA under an Emergency Use Authorization (EUA). This EUA will remain in effect (meaning this test can be used) for the duration of the COVID-19 declaration under Section 564(b)(1) of the Act, 21 U.S.C. section 360bbb-3(b)(1), unless the authorization is terminated or revoked.  Performed at [facility], [address] [place], [place] [address]   Urine Culture     Status: None   Collection Time: 01/03/24 10:59 AM   Specimen: Urine, Clean Catch  Result Value Ref Range Status   Specimen Description   Final    URINE, CLEAN CATCH Performed at [facility], [address], [place], [place] [address]    Special Requests   Final    NONE Performed at [facility], [address] [place], [place] [address]    Culture   Final    NO GROWTH Performed at [facility], [address] [place], [place] [address]    Report Status 01/04/2024 FINAL  Final    Radiology Studies: DG ESOPHAGUS W SINGLE CM (SOL OR THIN BA) Result Date: 01/04/2024 CLINICAL DATA:  Vomiting and abdominal pain. Paraesophageal fluid seen on recent CT. Evaluate for esophageal perforation. EXAM: ESOPHAGUS/BARIUM SWALLOW/TABLET STUDY TECHNIQUE:  Single contrast examination was performed using water-soluble contrast. This exam was performed by Toya Cousin, PA-C, and was supervised and interpreted by Norleen Kil, MD. FLUOROSCOPY: Radiation Exposure Index (as provided by the fluoroscopic device): 13.8 mGy Kerma COMPARISON:  None Available. FINDINGS: No evidence of contrast leak or extravasation from the esophagus. Esophagus shows normal contour, without evidence of stricture or mass. No hiatal hernia is seen. IMPRESSION: No evidence of esophageal perforation. Electronically Signed   By: Norleen DELENA Kil M.D.   On: 01/04/2024 12:42      Ralphie Lovelady T. Jereline Ticer [facility]  If 7PM-7AM, please contact night-coverage [email or web address] 01/05/2024, 11:37 AM

## 2024-01-05 NOTE — Evaluation (Signed)
 Physical Therapy Evaluation Patient Details Name: Mark [name] MRN: [id number] DOB: 02/28/1951 Today's Date: 01/05/2024  History of Present Illness  Pt is a 73 y.o male admitted for Bil knee pain, nausea, & vomiting. Imaging showed L 5th rib fx.  Riding left knee x-ray showing mild prepatellar soft tissue swelling without fracture or dislocation bil. PMH: CAD, CHF, etoh abuse, afib  Clinical Impression  Pt is presenting below baseline level of functioning. Currently pt is Min A for bed mobility, CGA for sit to stand and short distance non-functional gait with rollator. Pt is limited due to fatigue and generalized weakness. Due to pt current functional status, home set up and available assistance at home recommending skilled physical therapy services < 3 hours/day in order to address strength, balance and functional mobility to decrease risk for falls, injury, immobility, skin break down and re-hospitalization.          If plan is discharge home, recommend the following: Help with stairs or ramp for entrance;Assist for transportation;Assistance with cooking/housework;A little help with walking and/or transfers   Can travel by private vehicle   No    Equipment Recommendations Wheelchair cushion (measurements PT);Wheelchair (measurements PT);BSC/3in1     Functional Status Assessment Patient has had a recent decline in their functional status and demonstrates the ability to make significant improvements in function in a reasonable and predictable amount of time.     Precautions / Restrictions Precautions Precautions: Fall Recall of Precautions/Restrictions: Intact Restrictions Weight Bearing Restrictions Per Provider Order: No      Mobility  Bed Mobility Overal bed mobility: Needs Assistance Bed Mobility: Supine to Sit, Sit to Supine     Supine to sit: Min assist Sit to supine: Contact guard assist   General bed mobility comments: Min A at LE and trunk with increased time     Transfers Overall transfer level: Needs assistance Equipment used: Rollator (4 wheels) Transfers: Sit to/from Stand Sit to Stand: Contact guard assist           General transfer comment: stood from EOB unaided, CGA for safety, CGA for stabilization once in standing.    Ambulation/Gait Ambulation/Gait assistance: Min assist Gait Distance (Feet): 12 Feet Assistive device: Rollator (4 wheels) Gait Pattern/deviations: Step-through pattern, Decreased stride length, Knee flexed in stance - right, Knee flexed in stance - left Gait velocity: decreased Gait velocity interpretation: <1.31 ft/sec, indicative of household ambulator   General Gait Details: flexed knees in standing due to pain. Min A for safety due to instability. Pt unable to ambulate further than 12 ft    Balance Overall balance assessment: Needs assistance Sitting-balance support: Feet supported Sitting balance-Leahy Scale: Good     Standing balance support: During functional activity, Bilateral upper extremity supported, Reliant on assistive device for balance Standing balance-Leahy Scale: Poor         Pertinent Vitals/Pain Pain Assessment Pain Assessment: Faces Faces Pain Scale: Hurts even more Pain Location: knees Pain Descriptors / Indicators: Discomfort, Grimacing, Guarding Pain Intervention(s): Limited activity within patient's tolerance, Monitored during session    Home Living Family/patient expects to be discharged to:: Private residence Living Arrangements: Alone Available Help at Discharge: Friend(s);Available PRN/intermittently;Family (son can help intermittently is a Museum/gallery conservator.) Type of Home: Apartment Home Access: Stairs to enter Entrance Stairs-Rails: None Entrance Stairs-Number of Steps: 2   Home Layout: One level Home Equipment: Agricultural consultant (2 wheels);Rollator (4 wheels)      Prior Function Prior Level of Function : Needs assist  Mobility Comments:  uses rollator in the community ADLs Comments: Pt sponge bathes; states shower is broken. Uses the bus for transportation. Pt states that he manages his own medications     Extremity/Trunk Assessment   Upper Extremity Assessment Upper Extremity Assessment: Defer to OT evaluation    Lower Extremity Assessment Lower Extremity Assessment: Generalized weakness    Cervical / Trunk Assessment Cervical / Trunk Assessment: Kyphotic  Communication   Communication Communication: No apparent difficulties    Cognition Arousal: Alert Behavior During Therapy: WFL for tasks assessed/performed   PT - Cognitive impairments: Safety/Judgement     Following commands: Intact       Cueing Cueing Techniques: Verbal cues     General Comments General comments (skin integrity, edema, etc.): blisters quarter size on bil patellas        Assessment/Plan    PT Assessment Patient needs continued PT services  PT Problem List Decreased strength;Decreased activity tolerance;Decreased balance;Decreased mobility;Decreased safety awareness       PT Treatment Interventions DME instruction;Balance training;Gait training;Stair training;Functional mobility training;Therapeutic activities;Patient/family education;Therapeutic exercise    PT Goals (Current goals can be found in the Care Plan section)  Acute Rehab PT Goals Patient Stated Goal: to return home PT Goal Formulation: With patient Time For Goal Achievement: 01/19/24 Potential to Achieve Goals: Fair    Frequency Min 2X/week     Co-evaluation PT/OT/SLP Co-Evaluation/Treatment: Yes Reason for Co-Treatment: For patient/therapist safety;To address functional/ADL transfers PT goals addressed during session: Mobility/safety with mobility;Balance;Proper use of DME OT goals addressed during session: ADL's and self-care;Strengthening/ROM       AM-PAC PT 6 Clicks Mobility  Outcome Measure Help needed turning from your back to your side while  in a flat bed without using bedrails?: A Little Help needed moving from lying on your back to sitting on the side of a flat bed without using bedrails?: A Little Help needed moving to and from a bed to a chair (including a wheelchair)?: A Little Help needed standing up from a chair using your arms (e.g., wheelchair or bedside chair)?: A Little Help needed to walk in hospital room?: A Lot Help needed climbing 3-5 steps with a railing? : Total 6 Click Score: 15    End of Session Equipment Utilized During Treatment: Gait belt Activity Tolerance: Patient tolerated treatment well;Patient limited by pain Patient left: in bed;with call bell/phone within reach;with bed alarm set Nurse Communication: Mobility status PT Visit Diagnosis: History of falling (Z91.81);Other abnormalities of gait and mobility (R26.89);Muscle weakness (generalized) (M62.81)    Time: 8763-8692 PT Time Calculation (min) (ACUTE ONLY): 31 min   Charges:   PT Evaluation $PT Eval Low Complexity: 1 Low   PT General Charges $$ ACUTE PT VISIT: 1 Visit         Dorothyann Maier, DPT, CLT  Acute Rehabilitation Services Office: [phone] (Secure chat preferred)   Dorothyann VEAR Maier 01/05/2024, 2:36 PM

## 2024-01-05 NOTE — Progress Notes (Signed)
 Notified Dr. Kathrin of pt's BP 183/101, 183/105 while resting. See MAR  for new orders. Plan of care continues.

## 2024-01-06 ENCOUNTER — Other Ambulatory Visit (HOSPITAL_COMMUNITY): Payer: Self-pay

## 2024-01-06 DIAGNOSIS — R7989 Other specified abnormal findings of blood chemistry: Secondary | ICD-10-CM | POA: Diagnosis not present

## 2024-01-06 DIAGNOSIS — N179 Acute kidney failure, unspecified: Secondary | ICD-10-CM | POA: Diagnosis not present

## 2024-01-06 DIAGNOSIS — E876 Hypokalemia: Secondary | ICD-10-CM | POA: Diagnosis not present

## 2024-01-06 LAB — RENAL FUNCTION PANEL
Albumin: 2.5 g/dL — ABNORMAL LOW (ref 3.5–5.0)
Anion gap: 8 (ref 5–15)
BUN: 8 mg/dL (ref 8–23)
CO2: 21 mmol/L — ABNORMAL LOW (ref 22–32)
Calcium: 8.9 mg/dL (ref 8.9–10.3)
Chloride: 107 mmol/L (ref 98–111)
Creatinine, Ser: 0.94 mg/dL (ref 0.61–1.24)
GFR, Estimated: >60 mL/min (ref >60)
Glucose, Bld: 107 mg/dL — ABNORMAL HIGH (ref 70–99)
Phosphorus: 3.1 mg/dL (ref 2.5–4.6)
Potassium: 3.6 mmol/L (ref 3.5–5.1)
Sodium: 136 mmol/L (ref 135–145)

## 2024-01-06 LAB — CBC
HCT: 37.8 % — ABNORMAL LOW (ref 39.0–52.0)
Hemoglobin: 12.8 g/dL — ABNORMAL LOW (ref 13.0–17.0)
MCH: 33.2 pg (ref 26.0–34.0)
MCHC: 33.9 g/dL (ref 30.0–36.0)
MCV: 98.2 fL (ref 80.0–100.0)
Platelets: 178 10*3/uL (ref 150–400)
RBC: 3.85 MIL/uL — ABNORMAL LOW (ref 4.22–5.81)
RDW: 12.8 % (ref 11.5–15.5)
WBC: 6.3 10*3/uL (ref 4.0–10.5)
nRBC: 0 % (ref 0.0–0.2)

## 2024-01-06 LAB — MAGNESIUM: Magnesium: 1.6 mg/dL — ABNORMAL LOW (ref 1.7–2.4)

## 2024-01-06 MED ORDER — LOSARTAN POTASSIUM 50 MG PO TABS
50.0000 mg | ORAL_TABLET | Freq: Every day | ORAL | Status: DC
  Administered 2024-01-07: 50 mg via ORAL
  Filled 2024-01-06: qty 1

## 2024-01-06 MED ORDER — FOLIC ACID 1 MG PO TABS
1.0000 mg | ORAL_TABLET | Freq: Every day | ORAL | 0 refills | Status: DC
Start: 2024-01-07 — End: 2024-05-21
  Filled 2024-01-06: qty 30, 30d supply, fill #0

## 2024-01-06 MED ORDER — LOSARTAN POTASSIUM 50 MG PO TABS: 50.0000 mg | ORAL_TABLET | Freq: Every day | ORAL | Status: DC

## 2024-01-06 MED ORDER — ORAL CARE MOUTH RINSE: 15.0000 mL | OROMUCOSAL | Status: DC | PRN

## 2024-01-06 MED ORDER — LOSARTAN POTASSIUM 25 MG PO TABS
25.0000 mg | ORAL_TABLET | Freq: Once | ORAL | Status: AC
  Administered 2024-01-06: 25 mg via ORAL
  Filled 2024-01-06: qty 1

## 2024-01-06 MED ORDER — LOSARTAN POTASSIUM 25 MG PO TABS: 25.0000 mg | ORAL_TABLET | Freq: Every morning | ORAL | Status: DC

## 2024-01-06 MED ORDER — THIAMINE HCL 100 MG PO TABS
100.0000 mg | ORAL_TABLET | Freq: Every day | ORAL | 0 refills | Status: DC
  Filled 2024-01-06: qty 30, 30d supply, fill #0

## 2024-01-06 MED ORDER — PANTOPRAZOLE SODIUM 40 MG PO TBEC
40.0000 mg | DELAYED_RELEASE_TABLET | Freq: Every day | ORAL | 0 refills | Status: DC
Start: 2024-01-07 — End: 2024-05-21
  Filled 2024-01-06: qty 90, 90d supply, fill #0

## 2024-01-06 NOTE — Progress Notes (Addendum)
 Mobility Specialist Progress Note:   01/06/24 0921  Mobility  Activity Transferred from chair to bed  Level of Assistance Moderate assist, patient does 50-74%  Assistive Device Other (Comment) (HHA)  Distance Ambulated (ft) 3 ft  Activity Response Tolerated well  Mobility Referral Yes  Mobility visit 1 Mobility  Mobility Specialist Start Time (ACUTE ONLY) [id number]  Mobility Specialist Stop Time (ACUTE ONLY) 0926  Mobility Specialist Time Calculation (min) (ACUTE ONLY) 5 min   Pt received in chair, calling out in hallway for assistance back to bed. Required ModA to stand with HHA. Tolerated well, VSS throughout. Pt declined ambulation at this time. Pt lying comfortably in bed with all needs met, bed alarm on. Call bell in reach.  Nathaniel Wakeley Mobility Specialist Please contact via Special educational needs teacher or  Rehab office at [phone]

## 2024-01-06 NOTE — Plan of Care (Signed)
  Problem: Clinical Measurements: Goal: Will remain free from infection Outcome: Progressing   Problem: Clinical Measurements: Goal: Ability to maintain clinical measurements within normal limits will improve Outcome: Progressing Goal: Will remain free from infection Outcome: Progressing Goal: Diagnostic test results will improve Outcome: Progressing Goal: Respiratory complications will improve Outcome: Progressing Goal: Cardiovascular complication will be avoided Outcome: Progressing   Problem: Coping: Goal: Level of anxiety will decrease Outcome: Progressing   Problem: Pain Managment: Goal: General experience of comfort will improve and/or be controlled Outcome: Progressing   Problem: Safety: Goal: Ability to remain free from injury will improve Outcome: Progressing   Problem: Skin Integrity: Goal: Risk for impaired skin integrity will decrease Outcome: Progressing

## 2024-01-06 NOTE — Progress Notes (Signed)
 Occupational Therapy Treatment Patient Details Name: Mark [name] MRN: [id number] DOB: 1951/06/25 Today's Date: 01/06/2024   History of present illness Pt is a 73 y.o male admitted for Bil knee pain, nausea, & vomiting. Imaging showed L 5th rib fx.  Riding left knee x-ray showing mild prepatellar soft tissue swelling without fracture or dislocation bil. PMH: CAD, CHF, etoh abuse, afib   OT comments  Patient received in supine and asking to use BSC. Patient has already began BM while in bed. Patient requiring min assist to get to EOB and CGA with HHA to transfer to [facility]. Patient able to stand for toilet hygiene and performed bathing and gown change while seated on BSC. Patient able to transfer to recliner with CGA and verbal cues. Patient will benefit from continued inpatient follow up therapy, <3 hours/day.  Acute OT to continue to follow to address established goals to facilitate DC to next venue of care.        If plan is discharge home, recommend the following:  A little help with walking and/or transfers;A little help with bathing/dressing/bathroom;Assistance with cooking/housework;Direct supervision/assist for medications management;Direct supervision/assist for financial management;Assist for transportation;Help with stairs or ramp for entrance   Equipment Recommendations  Other (comment) (defer to next venue of care)    Recommendations for Other Services      Precautions / Restrictions Precautions Precautions: Fall Recall of Precautions/Restrictions: Intact Restrictions Weight Bearing Restrictions Per Provider Order: No       Mobility Bed Mobility Overal bed mobility: Needs Assistance Bed Mobility: Supine to Sit     Supine to sit: Min assist     General bed mobility comments: min assist for scooting to EOB and raising trunk    Transfers Overall transfer level: Needs assistance Equipment used: None Transfers: Sit to/from Stand, Bed to chair/wheelchair/BSC Sit to  Stand: Contact guard assist Stand pivot transfers: Contact guard assist         General transfer comment: transfers to [facility] from EOB and to recliner from [facility] with CGA     Balance Overall balance assessment: Needs assistance Sitting-balance support: Feet supported Sitting balance-Leahy Scale: Good     Standing balance support: During functional activity, Bilateral upper extremity supported, Reliant on assistive device for balance Standing balance-Leahy Scale: Poor Standing balance comment: reliant on external support                           ADL either performed or assessed with clinical judgement   ADL Overall ADL's : Needs assistance/impaired     Grooming: Wash/dry hands;Wash/dry face;Supervision/safety;Sitting   Upper Body Bathing: Minimal assistance;Sitting   Lower Body Bathing: Moderate assistance;Sit to/from stand Lower Body Bathing Details (indicate cue type and reason): assistance with peri area back while standing due to bowel incontinence in bed and patient able to perform peri area front seated on BSC Upper Body Dressing : Minimal assistance;Sitting Upper Body Dressing Details (indicate cue type and reason): gown     Toilet Transfer: Contact guard assist;Stand-pivot;BSC/3in1 Toilet Transfer Details (indicate cue type and reason): from EOB to [facility] with CGA and cues for safety Toileting- Clothing Manipulation and Hygiene: Minimal assistance;Sit to/from stand Toileting - Clothing Manipulation Details (indicate cue type and reason): stood for toilet hygiene with mod assist due to bowel incontinence in bed       General ADL Comments: patient asking to use BSC but had bowel movement in bed    Extremity/Trunk Assessment  Vision       Perception     Praxis     Communication Communication Communication: Impaired Factors Affecting Communication: Difficulty expressing self;Reduced clarity of speech   Cognition Arousal: Alert Behavior  During Therapy: WFL for tasks assessed/performed Cognition: No family/caregiver present to determine baseline                               Following commands: Intact        Cueing   Cueing Techniques: Verbal cues  Exercises      Shoulder Instructions       General Comments blisters on knees    Pertinent Vitals/ Pain       Pain Assessment Pain Assessment: Faces Faces Pain Scale: Hurts little more Pain Location: bil knees Pain Descriptors / Indicators: Discomfort, Grimacing, Guarding Pain Intervention(s): Limited activity within patient's tolerance, Monitored during session, Repositioned  Home Living                                          Prior Functioning/Environment              Frequency  Min 2X/week        Progress Toward Goals  OT Goals(current goals can now be found in the care plan section)  Progress towards OT goals: Progressing toward goals  Acute Rehab OT Goals Patient Stated Goal: to go home OT Goal Formulation: With patient Time For Goal Achievement: 01/19/24 Potential to Achieve Goals: Good ADL Goals Pt Will Perform Eating: with set-up;with adaptive utensils;sitting Pt Will Perform Grooming: with set-up;sitting Pt Will Perform Upper Body Dressing: with set-up;sitting Pt Will Perform Lower Body Dressing: with contact guard assist;sit to/from stand Pt Will Transfer to Toilet: with contact guard assist;ambulating;regular height toilet  Plan      Co-evaluation                 AM-PAC OT 6 Clicks Daily Activity     Outcome Measure   Help from another person eating meals?: A Lot Help from another person taking care of personal grooming?: A Little Help from another person toileting, which includes using toliet, bedpan, or urinal?: A Lot Help from another person bathing (including washing, rinsing, drying)?: A Lot Help from another person to put on and taking off regular upper body clothing?: A  Little Help from another person to put on and taking off regular lower body clothing?: A Lot 6 Click Score: 14    End of Session Equipment Utilized During Treatment: Gait belt  OT Visit Diagnosis: Unsteadiness on feet (R26.81);Other abnormalities of gait and mobility (R26.89);Muscle weakness (generalized) (M62.81);History of falling (Z91.81);Repeated falls (R29.6);Feeding difficulties (R63.3)   Activity Tolerance Patient tolerated treatment well   Patient Left in chair;with call bell/phone within reach;with chair alarm set   Nurse Communication Mobility status        Time: [phone] OT Time Calculation (min): 30 min  Charges: OT General Charges $OT Visit: 1 Visit OT Treatments $Self Care/Home Management : 23-37 mins  Dick Laine, OTA Acute Rehabilitation Services  Office [phone]   Jeb LITTIE Laine 01/06/2024, 10:13 AM

## 2024-01-06 NOTE — TOC Initial Note (Signed)
 Transition of Care (TOC) - Initial/Assessment Note  Rayfield Gobble RN, BSN Transitions of Care Unit 4E- RN Case Manager See Treatment Team for direct phone #   Patient Details  Name: Mark [name] MRN: [id number] Date of Birth: 1950-12-04  Transition of Care [facility]) CM/SW Contact:    Gobble Rayfield Hurst, RN Phone Number: 01/06/2024, 2:45 PM  Clinical Narrative:                 Spoke with pt at bedside to discuss transition needs. Per therapy recommendations for SNF.   Pt voiced that he would prefer to return home, declining SNF at this time. Discussed with pt plan at home. Pt lives at home alone, voiced he takes are of himself. Uses the Bus for transportation. Address verified in epic, pt voiced he had lost his phone on the bus- currently does not have a phone.   Note pt currently needing increased assistance and is not mobilizing any distances. Encouraged pt work with therapies and mobility. Pt has rollator at bedside- declined any need for w/c or BSC at this time.   Pt is active with [facility] for HHRN/PT/OT/SW- liaison following for resumption of care if pt returns home.   Expected Discharge Plan: Skilled Nursing Facility Barriers to Discharge: SNF Pending bed offer   Patient Goals and CMS Choice   CMS Medicare.gov Compare Post Acute Care list provided to:: Patient Choice offered to / list presented to : Patient      Expected Discharge Plan and Services In-house Referral: Clinical Social Work Discharge Planning Services: CM Consult Post Acute Care Choice: Home Health, Resumption of Svcs/PTA Provider, Skilled Nursing Facility Living arrangements for the past 2 months: Single Family Home Expected Discharge Date: 01/06/24                         HH Arranged: RN, PT, OT, Social Work [employer] Agency: [facility] Date [facility] Agency Contacted: 01/06/24 Time HH Agency Contacted: [phone] Representative spoke with at [facility] Agency: Andrez  Prior Living  Arrangements/Services Living arrangements for the past 2 months: Single Family Home Lives with:: Self              Current home services: DME (walker, rollator)    Activities of Daily Living   ADL Screening (condition at time of admission) Independently performs ADLs?: Yes (appropriate for developmental age) Is the patient deaf or have difficulty hearing?: No Does the patient have difficulty seeing, even when wearing glasses/contacts?: No Does the patient have difficulty concentrating, remembering, or making decisions?: Yes  Permission Sought/Granted                  Emotional Assessment           Psych Involvement: No (comment)  Admission diagnosis:  Dehydration [E86.0] AKI (acute kidney injury) (HCC) [N17.9] Contusion of knee, unspecified laterality, initial encounter [S80.00XA] Ambulatory dysfunction [R26.2] Closed fracture of one rib of right side, initial encounter [S22.31XA] Patient Active Problem List   Diagnosis Date Noted   Sepsis due to undetermined organism (HCC) 01/03/2024   Abnormal LFTs 01/03/2024   Hypomagnesemia 01/03/2024   Macrocytosis 01/03/2024   Hypokalemia 01/03/2024   Paraesophageal fluid collection 01/03/2024   Multiple rib fractures 01/03/2024   Recurrent falls 12/17/2023   Asthma with acute exacerbation 12/17/2023   Hypertensive emergency 12/16/2023   Inguinal hernia 05/05/2023   Chronic combined systolic and diastolic congestive heart failure (HCC) 05/05/2023   Paroxysmal atrial fibrillation (HCC) 05/05/2023  Bedbug bite 05/05/2023   Chest mass 05/05/2023   COPD (chronic obstructive pulmonary disease) (HCC)    Hypervolemia    Atrial fibrillation with RVR (HCC) 03/10/2022   Acute respiratory failure with hypoxia (HCC) 03/09/2022   Sepsis due to pneumonia (HCC) 03/09/2022   Elevated troponin 03/09/2022   EtOH dependence (HCC) 03/09/2022   Tobacco abuse 03/09/2022   PNA (pneumonia) 03/09/2022   ARF (acute renal failure) (HCC)  03/09/2022   Dehydration 03/09/2022   Abnormal TSH 03/09/2022   Chronic alcohol abuse    Severe persistent asthma with acute exacerbation    AKI (acute kidney injury) (HCC)    Severe sepsis (HCC)    Asthma exacerbation 11/10/2019   Tremor 11/10/2019   Alcohol abuse with intoxication (HCC) 11/10/2019   Hypertensive urgency 11/10/2019   Transaminitis 11/10/2019   PCP:  Campbell Reynolds, NP Pharmacy:   [facility] [id number] - [place], [place] - [address] AT [facility] [address] [place] [address] Phone: [phone] Fax: [phone]  [facility] [address] [place] [address] Phone: [phone] Fax: [phone]  [facility] - [facility] [address] [place] [address] Phone: [phone] Fax: [phone]     Social Drivers of Health (SDOH) Social History: SDOH Screenings   Food Insecurity: Food Insecurity Present (01/04/2024)  Housing: Low Risk  (01/04/2024)  Transportation Needs: Unmet Transportation Needs (01/04/2024)  Utilities: At Risk (01/04/2024)  Social Connections: Unknown (01/04/2024)  Tobacco Use: High Risk (01/02/2024)   SDOH Interventions:     Readmission Risk Interventions    05/07/2023   10:55 AM  Readmission Risk Prevention Plan  Transportation Screening Complete  PCP or Specialist Appt within 5-7 Days Complete  Home Care Screening Complete  Medication Review (RN CM) Complete

## 2024-01-06 NOTE — Progress Notes (Signed)
 PROGRESS NOTE  Mark [name] FMW:[id number] DOB: 04-06-1951   PCP: Campbell Reynolds, NP  Patient is from: Home.  Lives alone.  Uses rollator at baseline.  DOA: 01/02/2024 LOS: 3  Chief complaints Chief Complaint  Patient presents with   Vomiting   Knee Pain     Brief Narrative / Interim history: 73 year old M with PMH of COPD, EtOH abuse, combined CHF, CAD, paroxysmal A-fib on Eliquis , HTN, BPH, frequent falls and tobacco use disorder brought to ED by EMS due to bilateral knee pain for 1 day, nausea, vomiting and diarrhea, and admitted with working diagnosis of AKI, lactic acidosis, possible infiltrative fluid within the posterior mediastinum concerning for esophageal perforation and left fifth rib fracture.  In ED,  WBC 10.1.  Hgb 16.4.  Cr 2.49 (baseline 0.8-0.9).  CO2 21 with AG of 22.  BUN 16.lactic acid 4.8 but resolved quickly. RVP negative.  CK 142.   EtOH <15.  CXR concerning for acute appearing left fifth rib fracture.  CT abdomen and pelvis raised concern for hiatal hernia and interval development of normal opacified infiltrative fluid within the posterior mediastinum of unclear etiology.   Per EDP, CTS recommended CT scan with oral contrast versus esophagram to confirm diagnosis. CT chest redemonstrated mild circumferential wall thickening in lower thoracic esophagus with associated and slightly increased paraesophageal free fluid in the posterior lower mediastinum but CT obtained without oral contrast.    The next day, esophagram negative for esophageal perforation.  Started on soft diet that he has been tolerating without issue.  Therapy recommended SNF but patient prefers to go home.  He lives alone at home.  He is requiring moderate to maximum assist here.   Subjective: Seen and examined earlier this morning.  No major events overnight or this morning.  No complaints other than pain in his knees bilaterally from his fall.  Objective: Vitals:   01/06/24 0500 01/06/24 0731  01/06/24 0826 01/06/24 1132  BP:  (!) 153/102 (!) 143/92 (!) 163/89  Pulse:  93 (!) 107 89  Resp:  18  20  Temp:  98.3 F (36.8 C)  98.2 F (36.8 C)  TempSrc:  Oral  Oral  SpO2:  96%  99%  Weight: 69.6 kg     Height:        Examination:  GENERAL: No apparent distress.  Nontoxic. HEENT: MMM.  Vision and hearing grossly intact.  NECK: Supple.  No apparent JVD.  RESP:  No IWOB.  Fair aeration bilaterally. CVS:  RRR. Heart sounds normal.  ABD/GI/GU: BS+. Abd soft, NTND.  MSK/EXT:  Moves extremities.  Blisters over both knees.  FROM in both knees.  SKIN: no apparent skin lesion or wound NEURO: AA.  Oriented x 4.  Follows commands.  Moves all extremities.  No apparent focal neuro deficit. PSYCH: Calm. Normal affect.    Procedures: None  Microbiology summarized: COVID-19, influenza and RSV PCR nonreactive Blood cultures NGTD  Assessment and plan: AKI: Baseline Cr 0.8-0.9.  Likely prerenal from GI loss in the setting of nausea vomiting and diarrhea.  Also Lasix  and losartan  at home.  No obstruction on CT.  AKI resolved. Recent Labs    07/29/23 0345 09/23/23 2120 10/19/23 1416 12/16/23 0927 12/17/23 0418 01/02/24 2100 01/03/24 0456 01/04/24 0358 01/05/24 0341 01/06/24 0355  BUN 11 11 10 8 9 16 20 14 11 8   CREATININE 1.00 1.50* 0.84 0.89 0.93 2.49* 2.29* 1.26* 0.98 0.94  -Monitor off IV fluid.  Paraesophageal fluid collection? Noted on CT  chest and abdomen without contrast.  Presents with N/V.  Some question about possible esophageal perforation but esophagram negative. -Tolerating soft diet. - Continue p.o. Protonix .   Alcohol abuse/withdrawal: Nursing staff discovered 1/2 bottle of vodka in the room under his bed earlier which was removed. Elevated CIWA to 16 after midnight but improved to 4 later.  No withdrawal symptoms.  Has not required Ativan  in 24 hours. -Multivitamin, thiamine  and folic acid  -TOC consult  Chronic combined CHF: TTE in 04/2023 with LVEF of 30  to 35%, GH, G1 DD.  Appears euvolemic on exam.  No cardiopulmonary distress.  BP elevated -Continue holding diuretics. -Resume home Toprol -XL and losartan . -Strict intake and output, daily weight, renal functions and electrolytes  Essential hypertension: BP remains elevated.  -Continue home Toprol -XL -Increase losartan  to 50 mg daily -P.o. hydralazine  as needed  Paroxysmal A-fib: Currently in sinus rhythm.  On Toprol -XL and Eliquis  at home.  Reportedly had recurrent fall.  Not sure if he is a candidate for anticoagulation given his recurrent fall.  -Continue Eliquis  in house and discontinue discharge.  Discussed risk and benefit with patient on 6/30. -Continue home Toprol -XL. -Optimize electrolytes  Acute left fifth rib fracture and healing left 3rd through 7th rib fractures: Likely from multiple falls. - Pain control  Chronic COPD: Stable -Bronchodilators as needed -Encourage smoking cessation -Nicotine  patch  Hypokalemia/hypomagnesemia -Monitor replenish as appropriate  Generalized weakness/recurrent falls: Currently moderate to maximum assist.  He states he lives alone. -Fall precaution -PT/OT-recommended SNF  Lactic acidosis: Likely from alcohol.  Resolved.  Body mass index is 20.81 kg/m.           DVT prophylaxis:  SCDs Start: 01/03/24 0135 apixaban  (ELIQUIS ) tablet 5 mg  Code Status: Full code Family Communication:  Attempted to call patient's son and other emergency contact but no answer. Level of care: Telemetry Medical Status is: Inpatient Remains inpatient appropriate because: AKI, alcohol withdrawal and recurrent fall   Final disposition: SNF.   55 minutes with more than 50% spent in reviewing records, counseling patient/family and coordinating care.   Sch Meds:  Scheduled Meds:  apixaban   5 mg Oral BID   fluticasone  furoate-vilanterol  1 puff Inhalation Daily   folic acid   1 mg Oral Daily   losartan   25 mg Oral q morning   metoprolol  succinate   100 mg Oral Daily   multivitamin with minerals  1 tablet Oral Daily   nicotine   14 mg Transdermal Daily   pantoprazole   40 mg Oral Daily   thiamine   100 mg Oral Daily   Or   thiamine   100 mg Intravenous Daily   Continuous Infusions:   PRN Meds:.acetaminophen  **OR** acetaminophen , fentaNYL  (SUBLIMAZE ) injection, hydrALAZINE , ipratropium-albuterol , loperamide , naLOXone (NARCAN)  injection, ondansetron  (ZOFRAN ) IV, mouth rinse  Antimicrobials: Anti-infectives (From admission, onward)    Start     Dose/Rate Route Frequency Ordered Stop   01/03/24 1000  metroNIDAZOLE  (FLAGYL ) IVPB 500 mg  Status:  Discontinued        500 mg 100 mL/hr over 60 Minutes Intravenous Every 12 hours 01/03/24 0139 01/05/24 1143   01/03/24 0600  cefTRIAXone  (ROCEPHIN ) 2 g in sodium chloride  0.9 % 100 mL IVPB  Status:  Discontinued        2 g 200 mL/hr over 30 Minutes Intravenous Every 24 hours 01/03/24 0139 01/05/24 1143   01/03/24 0300  piperacillin-tazobactam (ZOSYN) IVPB 3.375 g        3.375 g 100 mL/hr over 30 Minutes Intravenous  Once  01/03/24 0255 01/03/24 0424   01/02/24 2200  ceFEPIme  (MAXIPIME ) 2 g in sodium chloride  0.9 % 100 mL IVPB        2 g 200 mL/hr over 30 Minutes Intravenous  Once 01/02/24 2146 01/02/24 2341   01/02/24 2200  metroNIDAZOLE  (FLAGYL ) IVPB 500 mg        500 mg 100 mL/hr over 60 Minutes Intravenous  Once 01/02/24 2146 01/03/24 0053   01/02/24 2200  vancomycin  (VANCOCIN ) IVPB 1000 mg/200 mL premix        1,000 mg 200 mL/hr over 60 Minutes Intravenous  Once 01/02/24 2146 01/03/24 0000        I have personally reviewed the following labs and images: CBC: Recent Labs  Lab 01/02/24 2100 01/03/24 0456 01/04/24 0358 01/05/24 0341 01/06/24 0355  WBC 10.1 9.0 8.2 7.6 6.3  NEUTROABS  --  6.8  --   --   --   HGB 16.4 13.7 12.7* 12.3* 12.8*  HCT 49.2 39.3 37.0* 36.5* 37.8*  MCV 102.3* 96.6 98.1 99.5 98.2  PLT 332 252 226 184 178   BMP &GFR Recent Labs  Lab 01/02/24 2100  01/03/24 0456 01/04/24 0358 01/05/24 0341 01/06/24 0355  NA 134* 134* 136 137 136  K 3.5 3.4* 3.1* 3.7 3.6  CL 91* 94* 99 105 107  CO2 21* 23 24 19* 21*  GLUCOSE 157* 116* 74 77 107*  BUN 16 20 14 11 8   CREATININE 2.49* 2.29* 1.26* 0.98 0.94  CALCIUM  9.5 8.5* 8.5* 8.9 8.9  MG  --  1.2* 1.7 1.6* 1.6*  PHOS  --   --   --  2.4* 3.1   Estimated Creatinine Clearance: 72 mL/min (by C-G formula based on SCr of 0.94 mg/dL). Liver & Pancreas: Recent Labs  Lab 01/02/24 2100 01/03/24 0456 01/04/24 0358 01/05/24 0341 01/06/24 0355  AST 51* 32 30 24  --   ALT 32 22 20 18   --   ALKPHOS 184* 136* 119 115  --   BILITOT 2.2* 1.8* 1.3* 0.8  --   PROT 8.8* 7.1 6.4* 6.3*  --   ALBUMIN 4.2 3.3* 2.8* 2.8* 2.5*   Recent Labs  Lab 01/02/24 2100  LIPASE 32   No results for input(s): AMMONIA in the last 168 hours. Diabetic: No results for input(s): HGBA1C in the last 72 hours. No results for input(s): GLUCAP in the last 168 hours. Cardiac Enzymes: Recent Labs  Lab 01/02/24 2219 01/05/24 0341  CKTOTAL 142 73   No results for input(s): PROBNP in the last 8760 hours. Coagulation Profile: Recent Labs  Lab 01/02/24 2245  INR 1.3*   Thyroid  Function Tests: No results for input(s): TSH, T4TOTAL, FREET4, T3FREE, THYROIDAB in the last 72 hours. Lipid Profile: No results for input(s): CHOL, HDL, LDLCALC, TRIG, CHOLHDL, LDLDIRECT in the last 72 hours. Anemia Panel: No results for input(s): VITAMINB12, FOLATE, FERRITIN, TIBC, IRON, RETICCTPCT in the last 72 hours. Urine analysis:    Component Value Date/Time   COLORURINE AMBER (A) 01/03/2024 1059   APPEARANCEUR HAZY (A) 01/03/2024 1059   LABSPEC 1.024 01/03/2024 1059   PHURINE 5.0 01/03/2024 1059   GLUCOSEU NEGATIVE 01/03/2024 1059   HGBUR NEGATIVE 01/03/2024 1059   BILIRUBINUR NEGATIVE 01/03/2024 1059   KETONESUR 5 (A) 01/03/2024 1059   PROTEINUR 30 (A) 01/03/2024 1059   NITRITE NEGATIVE  01/03/2024 1059   LEUKOCYTESUR NEGATIVE 01/03/2024 1059   Sepsis Labs: Invalid input(s): PROCALCITONIN, LACTICIDVEN  Microbiology: Recent Results (from the past 240 hours)  Blood Culture (routine x 2)     Status: None (Preliminary result)   Collection Time: 01/02/24 10:17 PM   Specimen: BLOOD  Result Value Ref Range Status   Specimen Description   Final    BLOOD LEFT ANTECUBITAL Performed at [facility], [address], [place], [place] [address]    Special Requests   Final    BOTTLES DRAWN AEROBIC AND ANAEROBIC Blood Culture results may not be optimal due to an inadequate volume of blood received in culture bottles Performed at [facility], [address] [place], [place] [address]    Culture   Final    NO GROWTH 3 DAYS Performed at [facility], [address], [place], [place] [address]    Report Status PENDING  Incomplete  Blood Culture (routine x 2)     Status: None (Preliminary result)   Collection Time: 01/02/24 10:33 PM   Specimen: BLOOD  Result Value Ref Range Status   Specimen Description   Final    BLOOD RIGHT ANTECUBITAL Performed at [facility], [address] [place], [place] [address]    Special Requests   Final    BOTTLES DRAWN AEROBIC AND ANAEROBIC Blood Culture results may not be optimal due to an inadequate volume of blood received in culture bottles Performed at [facility] [address] [place], [place] [address]    Culture   Final    NO GROWTH 3 DAYS Performed at [facility], [address] [place], [place] [address]    Report Status PENDING  Incomplete  Resp panel by RT-PCR (RSV, Flu A&B, Covid) Anterior Nasal Swab     Status: None   Collection Time: 01/02/24 10:45 PM   Specimen: Anterior Nasal Swab  Result Value Ref Range Status   SARS Coronavirus 2 by RT PCR NEGATIVE NEGATIVE Final    Comment: (NOTE) SARS-CoV-2 target nucleic acids are NOT  DETECTED.  The SARS-CoV-2 RNA is generally detectable in upper respiratory specimens during the acute phase of infection. The lowest concentration of SARS-CoV-2 viral copies this assay can detect is 138 copies/mL. A negative result does not preclude SARS-Cov-2 infection and should not be used as the sole basis for treatment or other patient management decisions. A negative result may occur with  improper specimen collection/handling, submission of specimen other than nasopharyngeal swab, presence of viral mutation(s) within the areas targeted by this assay, and inadequate number of viral copies(<138 copies/mL). A negative result must be combined with clinical observations, patient history, and epidemiological information. The expected result is Negative.  Fact Sheet for Patients:  BloggerCourse.com  Fact Sheet for Healthcare Providers:  SeriousBroker.it  This test is no t yet approved or cleared by the [place]  FDA and  has been authorized for detection and/or diagnosis of SARS-CoV-2 by FDA under an Emergency Use Authorization (EUA). This EUA will remain  in effect (meaning this test can be used) for the duration of the COVID-19 declaration under Section 564(b)(1) of the Act, 21 U.S.C.section 360bbb-3(b)(1), unless the authorization is terminated  or revoked sooner.       Influenza A by PCR NEGATIVE NEGATIVE Final   Influenza B by PCR NEGATIVE NEGATIVE Final    Comment: (NOTE) The Xpert Xpress SARS-CoV-2/FLU/RSV plus assay is intended as an aid in the diagnosis of influenza from Nasopharyngeal swab specimens and should not be used as a sole basis for treatment. Nasal washings and aspirates are unacceptable for Xpert  Xpress SARS-CoV-2/FLU/RSV testing.  Fact Sheet for Patients: BloggerCourse.com  Fact Sheet for Healthcare Providers: SeriousBroker.it  This test is not yet  approved or cleared by the [place]  FDA and has been authorized for detection and/or diagnosis of SARS-CoV-2 by FDA under an Emergency Use Authorization (EUA). This EUA will remain in effect (meaning this test can be used) for the duration of the COVID-19 declaration under Section 564(b)(1) of the Act, 21 U.S.C. section 360bbb-3(b)(1), unless the authorization is terminated or revoked.     Resp Syncytial Virus by PCR NEGATIVE NEGATIVE Final    Comment: (NOTE) Fact Sheet for Patients: BloggerCourse.com  Fact Sheet for Healthcare Providers: SeriousBroker.it  This test is not yet approved or cleared by the [place]  FDA and has been authorized for detection and/or diagnosis of SARS-CoV-2 by FDA under an Emergency Use Authorization (EUA). This EUA will remain in effect (meaning this test can be used) for the duration of the COVID-19 declaration under Section 564(b)(1) of the Act, 21 U.S.C. section 360bbb-3(b)(1), unless the authorization is terminated or revoked.  Performed at [facility], [address], [place], [place] [address]   Urine Culture     Status: None   Collection Time: 01/03/24 10:59 AM   Specimen: Urine, Clean Catch  Result Value Ref Range Status   Specimen Description   Final    URINE, CLEAN CATCH Performed at [facility], [address], [place], [place] [address]    Special Requests   Final    NONE Performed at [facility], [address], [place], [place] [address]    Culture   Final    NO GROWTH Performed at [facility], [address] [place], [place] [address]    Report Status 01/04/2024 FINAL  Final    Radiology Studies: No results found.     Logan Baltimore T. Mykelti Goldenstein [facility]  If 7PM-7AM, please contact night-coverage [email or web address] 01/06/2024, 2:16 PM

## 2024-01-06 NOTE — Plan of Care (Signed)

## 2024-01-07 DIAGNOSIS — N179 Acute kidney failure, unspecified: Secondary | ICD-10-CM | POA: Diagnosis not present

## 2024-01-07 LAB — CBC
HCT: 38.2 % — ABNORMAL LOW (ref 39.0–52.0)
Hemoglobin: 13 g/dL (ref 13.0–17.0)
MCH: 34 pg (ref 26.0–34.0)
MCHC: 34 g/dL (ref 30.0–36.0)
MCV: 100 fL (ref 80.0–100.0)
Platelets: 214 10*3/uL (ref 150–400)
RBC: 3.82 MIL/uL — ABNORMAL LOW (ref 4.22–5.81)
RDW: 12.9 % (ref 11.5–15.5)
WBC: 6.6 10*3/uL (ref 4.0–10.5)
nRBC: 0 % (ref 0.0–0.2)

## 2024-01-07 LAB — COMPREHENSIVE METABOLIC PANEL WITH GFR
ALT: 16 U/L (ref 0–44)
AST: 24 U/L (ref 15–41)
Albumin: 2.6 g/dL — ABNORMAL LOW (ref 3.5–5.0)
Alkaline Phosphatase: 112 U/L (ref 38–126)
Anion gap: 10 (ref 5–15)
BUN: 7 mg/dL — ABNORMAL LOW (ref 8–23)
CO2: 28 mmol/L (ref 22–32)
Calcium: 9.1 mg/dL (ref 8.9–10.3)
Chloride: 102 mmol/L (ref 98–111)
Creatinine, Ser: 1.03 mg/dL (ref 0.61–1.24)
GFR, Estimated: >60 mL/min (ref >60)
Glucose, Bld: 91 mg/dL (ref 70–99)
Potassium: 4 mmol/L (ref 3.5–5.1)
Sodium: 140 mmol/L (ref 135–145)
Total Bilirubin: 0.6 mg/dL (ref 0.0–1.2)
Total Protein: 6.4 g/dL — ABNORMAL LOW (ref 6.5–8.1)

## 2024-01-07 LAB — MAGNESIUM: Magnesium: 1.5 mg/dL — ABNORMAL LOW (ref 1.7–2.4)

## 2024-01-07 LAB — PHOSPHORUS: Phosphorus: 3.5 mg/dL (ref 2.5–4.6)

## 2024-01-07 MED ORDER — MAGNESIUM SULFATE 4 GM/100ML IV SOLN
4.0000 g | Freq: Once | INTRAVENOUS | Status: AC
  Administered 2024-01-07: 4 g via INTRAVENOUS
  Filled 2024-01-07: qty 100

## 2024-01-07 NOTE — Care Management Important Message (Signed)
 Important Message  Patient Details  Name: Mark [name] MRN: [id number] Date of Birth: [birth year]   Important Message Given:  Yes - Medicare IM     Vonzell Arrie Sharps 01/07/2024, 11:06 AM

## 2024-01-07 NOTE — Progress Notes (Signed)
 Physical Therapy Treatment Patient Details Name: Mark [name] MRN: [id number] DOB: 1950/12/09 Today's Date: 01/07/2024   History of Present Illness Pt is a 73 y.o male admitted for bil knee pain, nausea, & vomiting. Imaging showed L 5th rib fx.  Right and left knee x-ray showing mild prepatellar soft tissue swelling without fracture or dislocation bil. PMH: CAD, CHF, ETOH abuse, afib.    PT Comments  Pt received in supine, agreeable to therapy session with heavy encouragement, pt reluctant to get OOB but agreeable with discussion on plan for DC today. Pt able to perform bed mobility and sit<>stand from [facility] with Supervision, needing up to CGA for safety with step up/down on 7 step x5 reps to simulate home entry. Pt with resting tremor and reports this is chronic ~1 year. Pt eager to DC home and per RN he performed community distance gait trial with rollator in AM and no physical assist. Pt continues to benefit from PT services to progress toward functional mobility goals, pt may benefit from short term post-acute rehab but likely to refuse and planning to DC home in which case recommend HHPT.     If plan is discharge home, recommend the following: Help with stairs or ramp for entrance;Assist for transportation;Assistance with cooking/housework;A little help with walking and/or transfers   Can travel by private vehicle     No  Equipment Recommendations  Wheelchair cushion (measurements PT);Wheelchair (measurements PT);BSC/3in1 (pt defers WC or 3in1)    Recommendations for Other Services       Precautions / Restrictions Precautions Precautions: Fall Recall of Precautions/Restrictions: Intact Precaution/Restrictions Comments: watch HR Restrictions Weight Bearing Restrictions Per Provider Order: No     Mobility  Bed Mobility Overal bed mobility: Needs Assistance Bed Mobility: Supine to Sit, Sit to Supine     Supine to sit: Supervision Sit to supine: Modified independent  (Device/Increase time)   General bed mobility comments: from flat bed without use of rails, PTA assist with IV line and encouragement to initiate; increased time to perform    Transfers Overall transfer level: Needs assistance Equipment used: None Transfers: Sit to/from Stand Sit to Stand: Supervision           General transfer comment: pt refuses to sit up in chair post-stair training in room, requests return to supine; no assist needed for standing balance without AD    Ambulation/Gait Ambulation/Gait assistance: Contact guard assist Gait Distance (Feet): 5 Feet Assistive device: None Gait Pattern/deviations: Step-to pattern, Knee flexed in stance - right, Knee flexed in stance - left       General Gait Details: ~65ft due to pt self-limiting after stair negotiation and requesting to rest; forward/backward steps and sidesteps toward [facility] prior to return to supine   Stairs Stairs: Yes Stairs assistance: Contact guard assist Stair Management: Two rails, Step to pattern, Backwards, Forwards Number of Stairs: 5 General stair comments: single 7 step x5 reps, forward/backward leading with good leg and stepping back with less painful leg, no buckling or overt LOB; HR to ~108 bpm with exertion   Wheelchair Mobility     Tilt Bed    Modified Rankin (Stroke Patients Only)       Balance Overall balance assessment: Needs assistance Sitting-balance support: Feet supported Sitting balance-Leahy Scale: Good     Standing balance support: During functional activity, Bilateral upper extremity supported, Reliant on assistive device for balance, No upper extremity supported Standing balance-Leahy Scale: Poor Standing balance comment: static standing fair wtihout AD, dynamic standing poor needing  UE support for stairs                            Communication Communication Communication: No apparent difficulties Factors Affecting Communication:  (seems improved  today)  Cognition Arousal: Alert Behavior During Therapy: WFL for tasks assessed/performed   PT - Cognitive impairments: No family/caregiver present to determine baseline                       PT - Cognition Comments: self-limiting but some insight into safety/deficits today. Sometimes slow to respond to questions. Following commands: Intact      Cueing Cueing Techniques: Verbal cues  Exercises      General Comments General comments (skin integrity, edema, etc.): blisters on front of bil knees; HR 75 bpm at rest and to 108 bpm with exertion      Pertinent Vitals/Pain Pain Assessment Pain Assessment: Faces Faces Pain Scale: Hurts even more Pain Location: bil knees Pain Descriptors / Indicators: Discomfort, Grimacing, Guarding, Sore Pain Intervention(s): Limited activity within patient's tolerance, Monitored during session, Repositioned, Patient requesting pain meds-RN notified    Home Living                          Prior Function            PT Goals (current goals can now be found in the care plan section) Acute Rehab PT Goals Patient Stated Goal: to return home PT Goal Formulation: With patient Time For Goal Achievement: 01/19/24 Progress towards PT goals: Progressing toward goals    Frequency    Min 2X/week      PT Plan      Co-evaluation              AM-PAC PT 6 Clicks Mobility   Outcome Measure  Help needed turning from your back to your side while in a flat bed without using bedrails?: A Little Help needed moving from lying on your back to sitting on the side of a flat bed without using bedrails?: A Little Help needed moving to and from a bed to a chair (including a wheelchair)?: A Little Help needed standing up from a chair using your arms (e.g., wheelchair or bedside chair)?: A Little Help needed to walk in hospital room?: A Little Help needed climbing 3-5 steps with a railing? : A Little 6 Click Score: 18    End of  Session Equipment Utilized During Treatment: Gait belt Activity Tolerance: Patient tolerated treatment well;Patient limited by fatigue Patient left: in bed;with call bell/phone within reach;with bed alarm set;Other (comment) (HOB >30 deg in bed chair posture pt assisted to obtain a new phone) Nurse Communication: Mobility status;Patient requests pain meds PT Visit Diagnosis: History of falling (Z91.81);Other abnormalities of gait and mobility (R26.89);Muscle weakness (generalized) (M62.81)     Time: 1212-1232 PT Time Calculation (min) (ACUTE ONLY): 20 min  Charges:    $Gait Training: 8-22 mins PT General Charges $$ ACUTE PT VISIT: 1 Visit                     Sachiko Methot P., PTA Acute Rehabilitation Services Secure Chat Preferred 9a-5:30pm Office: [phone]    Connell HERO [facility] 01/07/2024, 12:48 PM

## 2024-01-07 NOTE — Discharge Summary (Signed)
 Physician Discharge Summary   Patient: Mark [name] MRN: [id number] DOB: [birth year]  Admit date:     01/02/2024  Discharge date: 01/07/24  Discharge Physician: ATLEE ABERNETHY    PCP: Campbell Reynolds, NP     Discharge Diagnoses: Principal Problem:   AKI (acute kidney injury) (HCC) Active Problems:   Tobacco abuse   Chronic alcohol abuse   COPD (chronic obstructive pulmonary disease) (HCC)   Chronic combined systolic and diastolic congestive heart failure (HCC)   Paroxysmal atrial fibrillation (HCC)   Sepsis due to undetermined organism (HCC)   Abnormal LFTs   Hypomagnesemia   Macrocytosis   Hypokalemia   Paraesophageal fluid collection   Multiple rib fractures  Resolved Problems:   * No resolved hospital problems. *  Hospital Course: 73 year old M with PMH of COPD, EtOH abuse, combined CHF, CAD, paroxysmal A-fib on Eliquis , HTN, BPH, frequent falls and tobacco use disorder brought to ED by EMS due to bilateral knee pain for 1 day, nausea, vomiting and diarrhea, and admitted with working diagnosis of AKI, lactic acidosis, possible infiltrative fluid within the posterior mediastinum concerning for esophageal perforation and left fifth rib fracture.   In ED,  WBC 10.1.  Hgb 16.4.  Cr 2.49 (baseline 0.8-0.9).  CO2 21 with AG of 22.  BUN 16.lactic acid 4.8 but resolved quickly. RVP negative.  CK 142.   EtOH <15.  CXR concerning for acute appearing left fifth rib fracture.  CT abdomen and pelvis raised concern for hiatal hernia and interval development of normal opacified infiltrative fluid within the posterior mediastinum of unclear etiology.   Per EDP, CTS recommended CT scan with oral contrast versus esophagram to confirm diagnosis. CT chest redemonstrated mild circumferential wall thickening in lower thoracic esophagus with associated and slightly increased paraesophageal free fluid in the posterior lower mediastinum but CT obtained without oral contrast.     The next day, esophagram  negative for esophageal perforation.  Started on soft diet that he has been tolerating without issue.  Therapy recommended SNF but patient prefers to go home.  He lives alone at home.  He is requiring moderate to maximum assist here.   On the morning of 01/07/2024 I received this message from Carlin KATHEE Barnes  Patient has ambulated length of the hallway with his personal Rolator with no issues. He will be ready for a D/C order and bus pass when your ready, thank you nursing   As a result, during my rounds the pt was asking for discharge home today and says he can ambulate very well with is Rolator; which is at bedside. Thus, pt will be discharged today 01/07/2024.    Discharge Diet Orders (From admission, onward)     Start     Ordered   01/06/24 0000  Diet - low sodium heart healthy        01/06/24 1413           DISCHARGE MEDICATION: Allergies as of 01/07/2024   No Known Allergies      Medication List     STOP taking these medications    Eliquis  5 MG Tabs tablet Generic drug: apixaban    furosemide  20 MG tablet Commonly known as: LASIX        TAKE these medications    acetaminophen  500 MG tablet Commonly known as: TYLENOL  Take 2 tablets (1,000 mg total) by mouth every 6 (six) hours as needed for mild pain (pain score 1-3) (or Fever >/= 101).   albuterol  108 (90 Base) MCG/ACT inhaler Commonly  known as: VENTOLIN  HFA Inhale 2 puffs into the lungs every 4 (four) hours as needed for wheezing or shortness of breath.   atorvastatin  40 MG tablet Commonly known as: LIPITOR Take 1 tablet (40 mg total) by mouth at bedtime.   folic acid  1 MG tablet Commonly known as: FOLVITE  Take 1 tablet (1 mg total) by mouth daily.   loratadine  10 MG tablet Commonly known as: CLARITIN  Take 1 tablet (10 mg total) by mouth daily.   losartan  50 MG tablet Commonly known as: COZAAR  Take 50 mg by mouth daily. What changed: Another medication with the same name was removed. Continue taking  this medication, and follow the directions you see here.   metoprolol  succinate 100 MG 24 hr tablet Commonly known as: TOPROL -XL Take 1 tablet (100 mg total) by mouth daily.   multivitamin with minerals Tabs tablet Take 1 tablet by mouth daily.   omeprazole  20 MG tablet Commonly known as: PRILOSEC  OTC Take 20 mg by mouth daily as needed (for reflux or heartburn).   pantoprazole  40 MG tablet Commonly known as: PROTONIX  Take 1 tablet (40 mg total) by mouth daily.   polyethylene glycol 17 g packet Commonly known as: MiraLax  Take 17 g by mouth daily.   senna-docusate 8.6-50 MG tablet Commonly known as: Senokot-S Take 1 tablet by mouth at bedtime as needed for moderate constipation.   Symbicort 160-4.5 MCG/ACT inhaler Generic drug: budesonide -formoterol  Inhale 2 puffs into the lungs daily.   thiamine  100 MG tablet Commonly known as: VITAMIN B1 Take 1 tablet (100 mg total) by mouth daily.               Durable Medical Equipment  (From admission, onward)           Start     Ordered   01/06/24 1051  For home use only DME Bedside commode  Once       Question:  Patient needs a bedside commode to treat with the following condition  Answer:  Generalized weakness   01/06/24 1050   01/06/24 1050  For home use only DME high strength lightweight manual wheelchair with seat cushion  Once       Comments: Patient suffers from generalized weakness and recurrent fall which impairs their ability to perform daily activities like bathing, dressing, feeding, grooming, and toileting in the home.  A cane, crutch, or walker will not resolve  issue with performing activities of daily living. A wheelchair will allow patient to safely perform daily activities.Length of need Lifetime. (THEN ONE OF THESE TWO:) Patient self-propels the wheelchair while engaging in frequent activities such as laundry, meals, and toileting which cannot be performed in a standard or lightweight wheelchair due to the  weight of the chair. Accessories: elevating leg rests (ELRs), wheel locks, extensions and anti-tippers.   01/06/24 1050            Follow-up Information     Campbell Reynolds, NP. Schedule an appointment as soon as possible for a visit in 1 week(s).   Contact information: [address] [place] [address] [phone]                Discharge Exam: Filed Weights   01/05/24 0412 01/06/24 0500 01/07/24 0500  Weight: 68.7 kg 69.6 kg 70.7 kg   Physical Exam HENT:     Head: Normocephalic.     Mouth/Throat:     Mouth: Mucous membranes are moist.   Cardiovascular:     Rate and Rhythm: Normal  rate.  Pulmonary:     Effort: Pulmonary effort is normal.  Abdominal:     Palpations: Abdomen is soft.   Skin:    General: Skin is warm.   Neurological:     Mental Status: He is alert. Mental status is at baseline.   Psychiatric:        Mood and Affect: Mood normal.      Condition at discharge: good  The results of significant diagnostics from this hospitalization (including imaging, microbiology, ancillary and laboratory) are listed below for reference.   Imaging Studies: DG ESOPHAGUS W SINGLE CM (SOL OR THIN BA) Result Date: 01/04/2024 CLINICAL DATA:  Vomiting and abdominal pain. Paraesophageal fluid seen on recent CT. Evaluate for esophageal perforation. EXAM: ESOPHAGUS/BARIUM SWALLOW/TABLET STUDY TECHNIQUE: Single contrast examination was performed using water-soluble contrast. This exam was performed by Toya Cousin, PA-C, and was supervised and interpreted by Norleen Kil, MD. FLUOROSCOPY: Radiation Exposure Index (as provided by the fluoroscopic device): 13.8 mGy Kerma COMPARISON:  None Available. FINDINGS: No evidence of contrast leak or extravasation from the esophagus. Esophagus shows normal contour, without evidence of stricture or mass. No hiatal hernia is seen. IMPRESSION: No evidence of esophageal perforation. Electronically Signed   By: Norleen DELENA Kil M.D.   On:  01/04/2024 12:42   CT Chest Wo Contrast Result Date: 01/03/2024 CLINICAL DATA:  Paraesophageal fluid on CT abdomen study from earlier today in the setting of vomiting and abdominal pain. EXAM: CT CHEST WITHOUT CONTRAST TECHNIQUE: Multidetector CT imaging of the chest was performed following the standard protocol without IV contrast. RADIATION DOSE REDUCTION: This exam was performed according to the departmental dose-optimization program which includes automated exposure control, adjustment of the mA and/or kV according to patient size and/or use of iterative reconstruction technique. COMPARISON:  01/03/2024 CT abdomen/pelvis. Chest radiograph from one day prior. 05/06/2023 chest CT. FINDINGS: Cardiovascular: Normal heart size. No significant pericardial effusion/thickening. Left anterior descending and left circumflex coronary atherosclerosis. Atherosclerotic nonaneurysmal thoracic aorta. Normal caliber pulmonary arteries. Mediastinum/Nodes: No significant thyroid  nodules. Redemonstration of mild circumferential wall thickening in the lower thoracic esophagus with associated paraesophageal free fluid in the posterior lower mediastinum, which appears mildly increased in volume since 01/03/2024 CT abdomen study. No definite extraluminal gas or pneumatosis. No pathologically enlarged axillary, mediastinal or hilar lymph nodes, noting limited sensitivity for the detection of hilar adenopathy on this noncontrast study. Lungs/Pleura: No pneumothorax. No pleural effusion. No acute consolidative airspace disease, lung masses or significant pulmonary nodules. Curvilinear mildly thickened parenchymal bands in the anterior apical right upper lobe are unchanged and compatible with postinfectious/postinflammatory scarring. Minimal paraseptal emphysema. Upper abdomen: Diffuse hepatic steatosis. Musculoskeletal: No aggressive appearing focal osseous lesions. Healing anterolateral left third, fourth, fifth, sixth and seventh rib  fractures, new from prior chest CT. Healed left ninth through twelfth rib fractures. Mild thoracic spondylosis. Chronic mild superior L1 vertebral compression fracture. IMPRESSION: 1. Please note that no oral contrast is identified on this scan. 2. Redemonstration of mild circumferential wall thickening in the lower thoracic esophagus with associated paraesophageal free fluid in the posterior lower mediastinum, which appears mildly increased in volume since 01/03/2024 CT abdomen study. No definite extraluminal gas or pneumatosis. Esophageal perforation not excluded. Consider further evaluation with upper endoscopy. 3. No acute pulmonary disease. 4. Two-vessel coronary atherosclerosis. 5. Diffuse hepatic steatosis. 6. Healing anterolateral left third through seventh rib fractures, new from prior chest CT. 7. Aortic Atherosclerosis (ICD10-I70.0) and Emphysema (ICD10-J43.9). Electronically Signed   By: Selinda DELENA Leonce CHRISTELLA.D.  On: 01/03/2024 09:15   CT ABDOMEN PELVIS WO CONTRAST Result Date: 01/03/2024 CLINICAL DATA:  Acute nonlocalized abdominal pain, vomiting, diarrhea EXAM: CT ABDOMEN AND PELVIS WITHOUT CONTRAST TECHNIQUE: Multidetector CT imaging of the abdomen and pelvis was performed following the standard protocol without IV contrast. RADIATION DOSE REDUCTION: This exam was performed according to the departmental dose-optimization program which includes automated exposure control, adjustment of the mA and/or kV according to patient size and/or use of iterative reconstruction technique. COMPARISON:  06/22/2023 FINDINGS: Lower chest: There is a small hiatal hernia again noted. There has developed mild paraesophageal infiltrative fluid within the posterior mediastinum which is of unclear etiology but may relate to esophageal perforation given history recurrent vomiting. Hepatobiliary: Mild hepatic steatosis. No enhancing intrahepatic mass. No intra or extrahepatic biliary ductal dilation. Gallbladder unremarkable.  Pancreas: Unremarkable Spleen: Unremarkable Adrenals/Urinary Tract: Adrenal glands are unremarkable. Kidneys are normal, without renal calculi, focal lesion, or hydronephrosis. Bladder is unremarkable. Stomach/Bowel: Stomach is within normal limits. Appendix appears normal. No evidence of bowel wall thickening, distention, or inflammatory changes. No free intraperitoneal gas or fluid Vascular/Lymphatic: Aortic atherosclerosis. No enlarged abdominal or pelvic lymph nodes. Reproductive: Moderate prostatic hypertrophy Other: No abdominal wall hernia. Status post right inguinal hernia repair Musculoskeletal: No acute bone abnormality. No lytic or blastic bone lesion. Osseous structures are age appropriate. IMPRESSION: 1. Small hiatal hernia. Interval development of mild paraesophageal infiltrative fluid within the posterior mediastinum of unclear etiology but possibly related to esophageal perforation given history of recurrent vomiting. This would be better assessed with dedicated CT examination of the chest utilizing an esophageal perforation protocol utilizing oral contrast only 2. Mild hepatic steatosis. 3. Moderate prostatic hypertrophy. 4. Status post right inguinal hernia repair. No abdominal wall hernia. Electronically Signed   By: Dorethia Molt M.D.   On: 01/03/2024 02:29   DG Knee Complete 4 Views Right Result Date: 01/02/2024 CLINICAL DATA:  Knee pain EXAM: RIGHT KNEE - COMPLETE 4+ VIEW COMPARISON:  None Available. FINDINGS: No evidence of fracture, dislocation, or joint effusion. No evidence of arthropathy or other focal bone abnormality. Mild prepatellar soft tissue swelling. IMPRESSION: 1. Mild prepatellar soft tissue swelling. No fracture or dislocation. Electronically Signed   By: Dorethia Molt M.D.   On: 01/02/2024 22:41   DG Knee Complete 4 Views Left Result Date: 01/02/2024 CLINICAL DATA:  Knee pain EXAM: LEFT KNEE - COMPLETE 4+ VIEW COMPARISON:  None Available. FINDINGS: No evidence of  fracture, dislocation, or joint effusion. No evidence of arthropathy or other focal bone abnormality. Mild prepatellar soft tissue swelling. Vascular calcifications noted. IMPRESSION: 1. Mild prepatellar soft tissue swelling. No fracture or dislocation. Electronically Signed   By: Dorethia Molt M.D.   On: 01/02/2024 22:41   DG Chest 2 View Result Date: 01/02/2024 CLINICAL DATA:  Chest pain EXAM: CHEST - 2 VIEW COMPARISON:  None Available. FINDINGS: Parenchymal scarring within the right apex again noted. Lungs are otherwise clear. No pneumothorax or pleural effusion. Cardiac size within normal limits. Pulmonary vascularity is normal. Acute appearing left fifth rib fracture seen laterally with a small amount of associated pleural thickening IMPRESSION: 1. Acute appearing left fifth rib fracture. No pneumothorax. Electronically Signed   By: Dorethia Molt M.D.   On: 01/02/2024 22:40   DG Chest Portable 1 View Result Date: 12/16/2023 CLINICAL DATA:  Cough and shortness of breath x1 week. EXAM: PORTABLE CHEST 1 VIEW COMPARISON:  None Available. FINDINGS: The heart size and mediastinal contours are within normal limits. There is mild right  apical scarring and/or atelectasis. No acute infiltrate, pleural effusion or pneumothorax is identified. Lateral sixth and seventh left rib fractures are seen. This is of indeterminate age and represents a new finding when compared to the prior study. IMPRESSION: 1. Mild right apical scarring and/or atelectasis. 2. Lateral sixth and seventh left rib fractures of indeterminate age. Electronically Signed   By: Suzen Dials M.D.   On: 12/16/2023 09:20    Microbiology: Results for orders placed or performed during the hospital encounter of 01/02/24  Blood Culture (routine x 2)     Status: None (Preliminary result)   Collection Time: 01/02/24 10:17 PM   Specimen: BLOOD  Result Value Ref Range Status   Specimen Description   Final    BLOOD LEFT ANTECUBITAL Performed at  [facility], [address] [place], [place] [address]    Special Requests   Final    BOTTLES DRAWN AEROBIC AND ANAEROBIC Blood Culture results may not be optimal due to an inadequate volume of blood received in culture bottles Performed at [facility], [address], [place], [place] [address]    Culture   Final    NO GROWTH 4 DAYS Performed at [facility], [address] [place], [place] [address]    Report Status PENDING  Incomplete  Blood Culture (routine x 2)     Status: None (Preliminary result)   Collection Time: 01/02/24 10:33 PM   Specimen: BLOOD  Result Value Ref Range Status   Specimen Description   Final    BLOOD RIGHT ANTECUBITAL Performed at [facility], [address], [place], [place] [address]    Special Requests   Final    BOTTLES DRAWN AEROBIC AND ANAEROBIC Blood Culture results may not be optimal due to an inadequate volume of blood received in culture bottles Performed at [facility], [address] [place], [place] [address]    Culture   Final    NO GROWTH 4 DAYS Performed at [facility], [address] [place], [place] [address]    Report Status PENDING  Incomplete  Resp panel by RT-PCR (RSV, Flu A&B, Covid) Anterior Nasal Swab     Status: None   Collection Time: 01/02/24 10:45 PM   Specimen: Anterior Nasal Swab  Result Value Ref Range Status   SARS Coronavirus 2 by RT PCR NEGATIVE NEGATIVE Final    Comment: (NOTE) SARS-CoV-2 target nucleic acids are NOT DETECTED.  The SARS-CoV-2 RNA is generally detectable in upper respiratory specimens during the acute phase of infection. The lowest concentration of SARS-CoV-2 viral copies this assay can detect is 138 copies/mL. A negative result does not preclude SARS-Cov-2 infection and should not be used as the sole basis for treatment or other patient management decisions. A negative result may occur with  improper  specimen collection/handling, submission of specimen other than nasopharyngeal swab, presence of viral mutation(s) within the areas targeted by this assay, and inadequate number of viral copies(<138 copies/mL). A negative result must be combined with clinical observations, patient history, and epidemiological information. The expected result is Negative.  Fact Sheet for Patients:  BloggerCourse.com  Fact Sheet for Healthcare Providers:  SeriousBroker.it  This test is no t yet approved or cleared by the [place]  FDA and  has been authorized for detection and/or diagnosis of SARS-CoV-2 by FDA under an Emergency Use Authorization (EUA). This EUA will remain  in effect (meaning this test can be used) for the  duration of the COVID-19 declaration under Section 564(b)(1) of the Act, 21 U.S.C.section 360bbb-3(b)(1), unless the authorization is terminated  or revoked sooner.       Influenza A by PCR NEGATIVE NEGATIVE Final   Influenza B by PCR NEGATIVE NEGATIVE Final    Comment: (NOTE) The Xpert Xpress SARS-CoV-2/FLU/RSV plus assay is intended as an aid in the diagnosis of influenza from Nasopharyngeal swab specimens and should not be used as a sole basis for treatment. Nasal washings and aspirates are unacceptable for Xpert Xpress SARS-CoV-2/FLU/RSV testing.  Fact Sheet for Patients: BloggerCourse.com  Fact Sheet for Healthcare Providers: SeriousBroker.it  This test is not yet approved or cleared by the [place]  FDA and has been authorized for detection and/or diagnosis of SARS-CoV-2 by FDA under an Emergency Use Authorization (EUA). This EUA will remain in effect (meaning this test can be used) for the duration of the COVID-19 declaration under Section 564(b)(1) of the Act, 21 U.S.C. section 360bbb-3(b)(1), unless the authorization is terminated or revoked.     Resp  Syncytial Virus by PCR NEGATIVE NEGATIVE Final    Comment: (NOTE) Fact Sheet for Patients: BloggerCourse.com  Fact Sheet for Healthcare Providers: SeriousBroker.it  This test is not yet approved or cleared by the [place]  FDA and has been authorized for detection and/or diagnosis of SARS-CoV-2 by FDA under an Emergency Use Authorization (EUA). This EUA will remain in effect (meaning this test can be used) for the duration of the COVID-19 declaration under Section 564(b)(1) of the Act, 21 U.S.C. section 360bbb-3(b)(1), unless the authorization is terminated or revoked.  Performed at [facility], [address] [place], [place] [address]   Urine Culture     Status: None   Collection Time: 01/03/24 10:59 AM   Specimen: Urine, Clean Catch  Result Value Ref Range Status   Specimen Description   Final    URINE, CLEAN CATCH Performed at [facility], [address] [place], [place] [address]    Special Requests   Final    NONE Performed at [facility], [address] [place], [place] [address]    Culture   Final    NO GROWTH Performed at [facility], [address] [place], [place] [address]    Report Status 01/04/2024 FINAL  Final    Labs: CBC: Recent Labs  Lab 01/03/24 0456 01/04/24 0358 01/05/24 0341 01/06/24 0355 01/07/24 0908  WBC 9.0 8.2 7.6 6.3 6.6  NEUTROABS 6.8  --   --   --   --   HGB 13.7 12.7* 12.3* 12.8* 13.0  HCT 39.3 37.0* 36.5* 37.8* 38.2*  MCV 96.6 98.1 99.5 98.2 100.0  PLT 252 226 184 178 214   Basic Metabolic Panel: Recent Labs  Lab 01/03/24 0456 01/04/24 0358 01/05/24 0341 01/06/24 0355 01/07/24 0908  NA 134* 136 137 136 140  K 3.4* 3.1* 3.7 3.6 4.0  CL 94* 99 105 107 102  CO2 23 24 19* 21* 28  GLUCOSE 116* 74 77 107* 91  BUN 20 14 11 8  7*  CREATININE 2.29* 1.26* 0.98 0.94 1.03  CALCIUM  8.5* 8.5* 8.9 8.9 9.1  MG  1.2* 1.7 1.6* 1.6* 1.5*  PHOS  --   --  2.4* 3.1 3.5   Liver Function Tests: Recent Labs  Lab 01/02/24 2100 01/03/24 0456 01/04/24 0358 01/05/24 0341 01/06/24 0355 01/07/24 0908  AST 51* 32 30 24  --  24  ALT 32 22 20 18   --  16  ALKPHOS 184* 136* 119 115  --  112  BILITOT 2.2* 1.8* 1.3* 0.8  --  0.6  PROT 8.8* 7.1 6.4* 6.3*  --  6.4*  ALBUMIN 4.2 3.3* 2.8* 2.8* 2.5* 2.6*   CBG: No results for input(s): GLUCAP in the last 168 hours.  Discharge time spent: greater than 30 minutes.  Signed: Rhea Kaelin , MD [facility] 01/07/2024

## 2024-01-07 NOTE — TOC Transition Note (Signed)
 Transition of Care (TOC) - Discharge Note Rayfield Gobble RN, BSN Transitions of Care Unit 4E- RN Case Manager See Treatment Team for direct phone #   Patient Details  Name: Mark [name] MRN: [id number] Date of Birth: [birth year]  Transition of Care [facility]) CM/SW Contact:  Gobble Rayfield Hurst, RN Phone Number: 01/07/2024, 11:25 AM   Clinical Narrative:    Pt stable for transition home today, pt still refuses SNF for rehab and voiced he wants to return home. Walked in hallway per Retail banker. Pt voiced he will take the bus home. Buss Pass provided for RN to give pt.  Pt has rollator at bedside. Declines BSC and wheelchair at this time.   HH orders placed to resume HH w/ [facility]. [facility] liaison notified for resumption needs.        Barriers to Discharge: Barriers Resolved   Patient Goals and CMS Choice Patient states their goals for this hospitalization and ongoing recovery are:: return home- refusing SNF CMS Medicare.gov Compare Post Acute Care list provided to:: Patient Choice offered to / list presented to : Patient      Discharge Placement                 Home w/ [facility]      Discharge Plan and Services Additional resources added to the After Visit Summary for   In-house Referral: Clinical Social Work Discharge Planning Services: CM Consult Post Acute Care Choice: Home Health, Resumption of Paediatric nurse, Skilled Nursing Facility          DME Arranged: Bedside commode, Patient refused services, Wheelchair manual DME Agency: NA       HH Arranged: Charity fundraiser, PT, OT, Social Work [employer] Agency: [facility] Date HH Agency Contacted: 01/06/24 Time HH Agency Contacted: 1329 Representative spoke with at [facility] Agency: Andrez  Social Drivers of Health (SDOH) Interventions SDOH Screenings   Food Insecurity: Food Insecurity Present (01/04/2024)  Housing: Low Risk  (01/04/2024)  Transportation Needs: Unmet Transportation Needs (01/04/2024)  Utilities: At Risk  (01/04/2024)  Social Connections: Unknown (01/04/2024)  Tobacco Use: High Risk (01/02/2024)     Readmission Risk Interventions    01/07/2024   11:25 AM 05/07/2023   10:55 AM  Readmission Risk Prevention Plan  Transportation Screening Complete Complete  PCP or Specialist Appt within 5-7 Days  Complete  Home Care Screening  Complete  Medication Review (RN CM)  Complete  HRI or Home Care Consult Complete   Social Work Consult for Recovery Care Planning/Counseling Complete   Palliative Care Screening Not Applicable   Medication Review Oceanographer) Complete

## 2024-01-08 LAB — CULTURE, BLOOD (ROUTINE X 2)
Culture: NO GROWTH
Culture: NO GROWTH

## 2024-01-29 ENCOUNTER — Emergency Department (HOSPITAL_COMMUNITY): Admission: EM | Admit: 2024-01-29 | Discharge: 2024-01-29 | Disposition: A | Discharge status: Home / Self Care

## 2024-01-29 ENCOUNTER — Emergency Department (HOSPITAL_COMMUNITY)

## 2024-01-29 ENCOUNTER — Other Ambulatory Visit: Payer: Self-pay

## 2024-01-29 ENCOUNTER — Encounter (HOSPITAL_COMMUNITY): Payer: Self-pay | Admitting: Pharmacy Technician

## 2024-01-29 DIAGNOSIS — M25561 Pain in right knee: Secondary | ICD-10-CM | POA: Insufficient documentation

## 2024-01-29 DIAGNOSIS — L089 Local infection of the skin and subcutaneous tissue, unspecified: Secondary | ICD-10-CM | POA: Diagnosis not present

## 2024-01-29 DIAGNOSIS — M25562 Pain in left knee: Secondary | ICD-10-CM | POA: Insufficient documentation

## 2024-01-29 LAB — CBC WITH DIFFERENTIAL/PLATELET
Abs Immature Granulocytes: 0.03 K/uL (ref 0.00–0.07)
Basophils Absolute: 0.1 K/uL (ref 0.0–0.1)
Basophils Relative: 1 %
Eosinophils Absolute: 0.1 K/uL (ref 0.0–0.5)
Eosinophils Relative: 1 %
HCT: 38.7 % — ABNORMAL LOW (ref 39.0–52.0)
Hemoglobin: 12.6 g/dL — ABNORMAL LOW (ref 13.0–17.0)
Immature Granulocytes: 1 %
Lymphocytes Relative: 24 %
Lymphs Abs: 1.5 K/uL (ref 0.7–4.0)
MCH: 32.9 pg (ref 26.0–34.0)
MCHC: 32.6 g/dL (ref 30.0–36.0)
MCV: 101 fL — ABNORMAL HIGH (ref 80.0–100.0)
Monocytes Absolute: 0.6 K/uL (ref 0.1–1.0)
Monocytes Relative: 10 %
Neutro Abs: 3.9 K/uL (ref 1.7–7.7)
Neutrophils Relative %: 63 %
Platelets: 300 K/uL (ref 150–400)
RBC: 3.83 MIL/uL — ABNORMAL LOW (ref 4.22–5.81)
RDW: 13.8 % (ref 11.5–15.5)
WBC: 6.2 K/uL (ref 4.0–10.5)
nRBC: 0 % (ref 0.0–0.2)

## 2024-01-29 LAB — COMPREHENSIVE METABOLIC PANEL WITH GFR
ALT: 13 U/L (ref 0–44)
AST: 27 U/L (ref 15–41)
Albumin: 3 g/dL — ABNORMAL LOW (ref 3.5–5.0)
Alkaline Phosphatase: 114 U/L (ref 38–126)
Anion gap: 13 (ref 5–15)
BUN: 10 mg/dL (ref 8–23)
CO2: 21 mmol/L — ABNORMAL LOW (ref 22–32)
Calcium: 9.4 mg/dL (ref 8.9–10.3)
Chloride: 104 mmol/L (ref 98–111)
Creatinine, Ser: 1 mg/dL (ref 0.61–1.24)
GFR, Estimated: >60 mL/min (ref >60)
Glucose, Bld: 85 mg/dL (ref 70–99)
Potassium: 3.4 mmol/L — ABNORMAL LOW (ref 3.5–5.1)
Sodium: 138 mmol/L (ref 135–145)
Total Bilirubin: 0.7 mg/dL (ref 0.0–1.2)
Total Protein: 7.5 g/dL (ref 6.5–8.1)

## 2024-01-29 MED ORDER — OXYCODONE-ACETAMINOPHEN 5-325 MG PO TABS
2.0000 | ORAL_TABLET | Freq: Once | ORAL | Status: AC
  Administered 2024-01-29: 2 via ORAL
  Filled 2024-01-29: qty 2

## 2024-01-29 MED ORDER — HYDROCODONE-ACETAMINOPHEN 5-325 MG PO TABS
1.0000 | ORAL_TABLET | Freq: Once | ORAL | Status: AC
  Administered 2024-01-29: 1 via ORAL
  Filled 2024-01-29: qty 1

## 2024-01-29 MED ORDER — CEPHALEXIN 500 MG PO CAPS: 500.0000 mg | ORAL_CAPSULE | Freq: Four times a day (QID) | ORAL | 0 refills | Status: DC

## 2024-01-29 MED ORDER — HYDROCODONE-ACETAMINOPHEN 5-325 MG PO TABS: 2.0000 | ORAL_TABLET | ORAL | 0 refills | Status: DC | PRN

## 2024-01-29 NOTE — ED Provider Triage Note (Signed)
 Emergency Medicine Provider Triage Evaluation Note  Mark [name] , a 73 y.o. male  was evaluated in triage.  Pt complains of bilateral knee pain.  Pain has been going on for months since a fall.  Pain is in the anterior aspect of both knees.  Endorses increased pain and swelling.  Denies fever and chills.  Review of Systems  Positive: See above Negative: See above  Physical Exam  BP (!) 153/106   Pulse 72   Temp 97.7 F (36.5 C)   Resp 18  Gen:   Awake, no distress   Resp:  Normal effort  MSK:   Moves extremities without difficulty  Other:    Medical Decision Making  Medically screening exam initiated at 2:47 PM.  Appropriate orders placed.  Mark [name] was informed that the remainder of the evaluation will be completed by another provider, this initial triage assessment does not replace that evaluation, and the importance of remaining in the ED until their evaluation is complete.  Work up started   Lang Norleen POUR, PA-C 01/29/24 1447

## 2024-01-29 NOTE — ED Notes (Signed)
 Patient transported to X-ray

## 2024-01-29 NOTE — ED Notes (Signed)
 PTAR called

## 2024-01-29 NOTE — ED Triage Notes (Signed)
 Pt bib ems with bil knee pain from fall a month ago. Decreased mobility due to pain. VSS.

## 2024-01-29 NOTE — ED Provider Notes (Addendum)
 [place] EMERGENCY DEPARTMENT AT [facility] Provider Note   CSN: [id number] Arrival date & time: 01/29/24  1327     Patient presents with: Knee Pain   Mark [name] is a 73 y.o. male who presents to the ED today with complaints of bilateral anterior knee pain.  States he had a fall approximately a month ago, had superficial abrasions, was admitted for other reasons to the hospital on July 1 however states that when he was discharged from the hospital he did have some pus drainage from the wounds bilaterally.  Endorses persistent pain in the anterior knees bilaterally, worsened with ambulation and weightbearing.  Primary reason for presenting to the ED today is secondary to decreased ambulation with knee pain.    Knee Pain      Prior to Admission medications   Medication Sig Start Date End Date Taking? Authorizing Provider  acetaminophen  (TYLENOL ) 500 MG tablet Take 2 tablets (1,000 mg total) by mouth every 6 (six) hours as needed for mild pain (pain score 1-3) (or Fever >/= 101). 05/08/23   Tammy Sor, PA-C  albuterol  (PROVENTIL  HFA;VENTOLIN  HFA) 108 (90 Base) MCG/ACT inhaler Inhale 2 puffs into the lungs every 4 (four) hours as needed for wheezing or shortness of breath. 07/03/16   Horton, Charmaine FALCON, MD  atorvastatin  (LIPITOR) 40 MG tablet Take 1 tablet (40 mg total) by mouth at bedtime. 12/17/23   Addie Perkins, DO  folic acid  (FOLVITE ) 1 MG tablet Take 1 tablet (1 mg total) by mouth daily. 01/07/24   Gonfa, Taye T, MD  loratadine  (CLARITIN ) 10 MG tablet Take 1 tablet (10 mg total) by mouth daily. 12/18/23   Addie Perkins, DO  losartan  (COZAAR ) 50 MG tablet Take 50 mg by mouth daily. 12/18/23   [provider]  metoprolol  succinate (TOPROL -XL) 100 MG 24 hr tablet Take 1 tablet (100 mg total) by mouth daily. 12/17/23   Addie Perkins, DO  Multiple Vitamin (MULTIVITAMIN WITH MINERALS) TABS tablet Take 1 tablet by mouth daily.    [provider]  omeprazole   (PRILOSEC  OTC) 20 MG tablet Take 20 mg by mouth daily as needed (for reflux or heartburn).    [provider]  pantoprazole  (PROTONIX ) 40 MG tablet Take 1 tablet (40 mg total) by mouth daily. 01/07/24   Gonfa, Taye T, MD  polyethylene glycol (MIRALAX ) 17 g packet Take 17 g by mouth daily. 06/22/23   Ula Prentice SAUNDERS, MD  senna-docusate (SENOKOT-S) 8.6-50 MG tablet Take 1 tablet by mouth at bedtime as needed for moderate constipation. 06/22/23   Ula Prentice SAUNDERS, MD  SYMBICORT 160-4.5 MCG/ACT inhaler Inhale 2 puffs into the lungs daily.  10/14/19   [provider]  thiamine  (VITAMIN B1) 100 MG tablet Take 1 tablet (100 mg total) by mouth daily. 01/07/24   Gonfa, Taye T, MD    Allergies: Patient has no known allergies.    Review of Systems  Musculoskeletal:  Positive for arthralgias.  All other systems reviewed and are negative.   Updated Vital Signs BP (!) 180/90 (BP Location: Left Arm)   Pulse 86   Temp 97.6 F (36.4 C) (Oral)   Resp 17   SpO2 96%   Physical Exam Vitals and nursing note reviewed.  Constitutional:      General: He is not in acute distress.    Appearance: Normal appearance.  HENT:     Head: Normocephalic and atraumatic.     Mouth/Throat:     Mouth: Mucous membranes are moist.  Pharynx: Oropharynx is clear.  Eyes:     Extraocular Movements: Extraocular movements intact.     Conjunctiva/sclera: Conjunctivae normal.     Pupils: Pupils are equal, round, and reactive to light.  Cardiovascular:     Rate and Rhythm: Normal rate and regular rhythm.     Pulses: Normal pulses.     Heart sounds: Normal heart sounds. No murmur heard.    No friction rub. No gallop.  Pulmonary:     Effort: Pulmonary effort is normal.     Breath sounds: Normal breath sounds.  Abdominal:     General: Abdomen is flat. Bowel sounds are normal.     Palpations: Abdomen is soft.  Musculoskeletal:        General: Normal range of motion.     Cervical back: Normal range of motion and  neck supple.     Right lower leg: No edema.     Left lower leg: No edema.     Comments: Decreased flexion of the bilateral knees secondary to pain.  Feet:     Right foot:     Skin integrity: Callus and dry skin present.     Toenail Condition: Right toenails are abnormally thick and long.     Left foot:     Skin integrity: Callus and dry skin present.     Toenail Condition: Left toenails are abnormally thick and long.  Skin:    General: Skin is warm and dry.     Capillary Refill: Capillary refill takes less than 2 seconds.     Findings: Rash and wound present. Rash is scaling.     Comments: Bilateral anterior knees show erosion with eschar.  Periwound skin is erythematous and tender  Neurological:     General: No focal deficit present.     Mental Status: He is alert. Mental status is at baseline.  Psychiatric:        Mood and Affect: Mood normal.        (all labs ordered are listed, but only abnormal results are displayed) Labs Reviewed  COMPREHENSIVE METABOLIC PANEL WITH GFR - Abnormal; Notable for the following components:      Result Value   Potassium 3.4 (*)    CO2 21 (*)    Albumin 3.0 (*)    All other components within normal limits  CBC WITH DIFFERENTIAL/PLATELET - Abnormal; Notable for the following components:   RBC 3.83 (*)    Hemoglobin 12.6 (*)    HCT 38.7 (*)    MCV 101.0 (*)    All other components within normal limits    EKG: None  Radiology: DG Knee Complete 4 Views Right Result Date: 01/29/2024 CLINICAL DATA:  Bilateral knee pain, fall 1 month ago. EXAM: RIGHT KNEE - COMPLETE 4+ VIEW COMPARISON:  None Available. FINDINGS: No acute osseous or joint abnormality. Prepatellar soft tissue swelling. There may be slight lateral compartment narrowing. IMPRESSION: 1. Prepatellar soft tissue swelling. No acute underlying abnormality. 2. Possible mild lateral compartment joint space narrowing. Electronically Signed   By: Newell Eke M.D.   On: 01/29/2024 15:20    DG Knee Complete 4 Views Left Result Date: 01/29/2024 CLINICAL DATA:  Bilateral knee pain, fall 1 month ago. EXAM: LEFT KNEE - COMPLETE 4+ VIEW COMPARISON:  None Available. FINDINGS: No acute osseous or joint abnormality. No healing fracture. There may be mild prepatellar soft tissue swelling. IMPRESSION: Probable mild prepatellar soft tissue swelling.  Otherwise negative. Electronically Signed   By: Newell Eke M.D.  On: 01/29/2024 15:19     Procedures   Medications Ordered in the ED  HYDROcodone -acetaminophen  (NORCO/VICODIN) 5-325 MG per tablet 1 tablet (1 tablet Oral Given 01/29/24 1501)    Clinical Course as of 01/29/24 1829  Wed Jan 29, 2024  1711 Hemoglobin(!): 12.6 Noted, stable to readings from 3 weeks prior. [JG]  77 73 yo male here with fall 1 month ago and scraped both knees, now has eschar formed overlying biltaral patella.  Reports pain is getting worse with walking.  Feels redness spreading.  His PCP gave him some pain meds which helped but he is out.  On exam he has 3 inch eschars bilaterally overlying his patella with surrounding erythema, no drainage, good ROM and no large joint effusion.  WBC normal. Afebrile.  I suspect this is a cellulitis or possible bursitis, but I do not think it is a septic arthritis.  He'll likely need eventual debridement of these eschars for wound healing, and will need pain meds and antibiotics.   [MT]    Clinical Course User Index [JG] Myriam Dorn BROCKS, PA [MT] Cottie Donnice PARAS, MD                                 Medical Decision Making Amount and/or Complexity of Data Reviewed Labs:  Decision-making details documented in ED Course.  Risk Prescription drug management.   Medical Decision Making:   Wahid Holley is a 73 y.o. male who presented to the ED today with knee pain detailed above.     Complete initial physical exam performed, notably the patient  was alert and oriented in no apparent distress.  Bilateral anterior  knees are tender with erosions noted and eschar centrally.  Periwound tissue is erythematous and tender.    Reviewed and confirmed nursing documentation for past medical history, family history, social history.    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    Initial Plan:  Plain film imaging of bilateral knees Screening labs including CBC and Metabolic panel to evaluate for infectious or metabolic etiology of disease.  Pain medication as noted Objective evaluation as below reviewed   Initial Study Results:   Laboratory  All laboratory results reviewed without evidence of clinically relevant pathology.   Exceptions include: None   Radiology:  All images reviewed independently. Agree with radiology report at this time.   DG Knee Complete 4 Views Right Result Date: 01/29/2024 CLINICAL DATA:  Bilateral knee pain, fall 1 month ago. EXAM: RIGHT KNEE - COMPLETE 4+ VIEW COMPARISON:  None Available. FINDINGS: No acute osseous or joint abnormality. Prepatellar soft tissue swelling. There may be slight lateral compartment narrowing. IMPRESSION: 1. Prepatellar soft tissue swelling. No acute underlying abnormality. 2. Possible mild lateral compartment joint space narrowing. Electronically Signed   By: Newell Eke M.D.   On: 01/29/2024 15:20   DG Knee Complete 4 Views Left Result Date: 01/29/2024 CLINICAL DATA:  Bilateral knee pain, fall 1 month ago. EXAM: LEFT KNEE - COMPLETE 4+ VIEW COMPARISON:  None Available. FINDINGS: No acute osseous or joint abnormality. No healing fracture. There may be mild prepatellar soft tissue swelling. IMPRESSION: Probable mild prepatellar soft tissue swelling.  Otherwise negative. Electronically Signed   By: Newell Eke M.D.   On: 01/29/2024 15:19   DG ESOPHAGUS W SINGLE CM (SOL  OR THIN BA) Result Date: 01/04/2024 CLINICAL DATA:   Vomiting and abdominal pain. Paraesophageal fluid seen on recent CT. Evaluate for esophageal perforation. EXAM: ESOPHAGUS/BARIUM SWALLOW/TABLET STUDY TECHNIQUE: Single contrast examination was performed using water-soluble contrast. This exam was performed by Toya Cousin, PA-C, and was supervised and interpreted by Norleen Kil, MD. FLUOROSCOPY: Radiation Exposure Index (as provided by the fluoroscopic device): 13.8 mGy Kerma COMPARISON:  None Available. FINDINGS: No evidence of contrast leak or extravasation from the esophagus. Esophagus shows normal contour, without evidence of stricture or mass. No hiatal hernia is seen. IMPRESSION: No evidence of esophageal perforation. Electronically Signed   By: Norleen DELENA Kil M.D.   On: 01/04/2024 12:42   CT Chest Wo Contrast Result Date: 01/03/2024 CLINICAL DATA:  Paraesophageal fluid on CT abdomen study from earlier today in the setting of vomiting and abdominal pain. EXAM: CT CHEST WITHOUT CONTRAST TECHNIQUE: Multidetector CT imaging of the chest was performed following the standard protocol without IV contrast. RADIATION DOSE REDUCTION: This exam was performed according to the departmental dose-optimization program which includes automated exposure control, adjustment of the mA and/or kV according to patient size and/or use of iterative reconstruction technique. COMPARISON:  01/03/2024 CT abdomen/pelvis. Chest radiograph from one day prior. 05/06/2023 chest CT. FINDINGS: Cardiovascular: Normal heart size. No significant pericardial effusion/thickening. Left anterior descending and left circumflex coronary atherosclerosis. Atherosclerotic nonaneurysmal thoracic aorta. Normal caliber pulmonary arteries. Mediastinum/Nodes: No significant thyroid  nodules. Redemonstration of mild circumferential wall thickening in the lower thoracic esophagus with associated paraesophageal free fluid in the posterior lower mediastinum, which appears mildly increased in volume since 01/03/2024 CT  abdomen study. No definite extraluminal gas or pneumatosis. No pathologically enlarged axillary, mediastinal or hilar lymph nodes, noting limited sensitivity for the detection of hilar adenopathy on this noncontrast study. Lungs/Pleura: No pneumothorax. No pleural effusion. No acute consolidative airspace disease, lung masses or significant pulmonary nodules. Curvilinear mildly thickened parenchymal bands in the anterior apical right upper lobe are unchanged and compatible with postinfectious/postinflammatory scarring. Minimal paraseptal emphysema. Upper abdomen: Diffuse hepatic steatosis. Musculoskeletal: No aggressive appearing focal osseous lesions. Healing anterolateral left third, fourth, fifth, sixth and seventh rib fractures, new from prior chest CT. Healed left ninth through twelfth rib fractures. Mild thoracic spondylosis. Chronic mild superior L1 vertebral compression fracture. IMPRESSION: 1. Please note that no oral contrast is identified on this scan. 2. Redemonstration of mild circumferential wall thickening in the lower thoracic esophagus with associated paraesophageal free fluid in the posterior lower mediastinum, which appears mildly increased in volume since 01/03/2024 CT abdomen study. No definite extraluminal gas or pneumatosis. Esophageal perforation not excluded. Consider further evaluation with upper endoscopy. 3. No acute pulmonary disease. 4. Two-vessel coronary atherosclerosis. 5. Diffuse hepatic steatosis. 6. Healing anterolateral left third through seventh rib fractures, new from prior chest CT. 7. Aortic Atherosclerosis (ICD10-I70.0) and Emphysema (ICD10-J43.9). Electronically Signed   By: Selinda DELENA Blue M.D.   On: 01/03/2024 09:15   CT ABDOMEN PELVIS WO CONTRAST Result Date: 01/03/2024 CLINICAL DATA:  Acute nonlocalized abdominal pain, vomiting, diarrhea EXAM: CT ABDOMEN AND PELVIS WITHOUT CONTRAST TECHNIQUE: Multidetector CT imaging of the abdomen and pelvis was performed following the  standard protocol without IV contrast. RADIATION DOSE REDUCTION: This exam was performed according to the departmental dose-optimization program which includes automated exposure control, adjustment of the mA and/or kV according to patient size and/or use of iterative reconstruction technique. COMPARISON:  06/22/2023 FINDINGS: Lower chest: There is a small hiatal hernia again noted. There has developed mild paraesophageal infiltrative fluid  within the posterior mediastinum which is of unclear etiology but may relate to esophageal perforation given history recurrent vomiting. Hepatobiliary: Mild hepatic steatosis. No enhancing intrahepatic mass. No intra or extrahepatic biliary ductal dilation. Gallbladder unremarkable. Pancreas: Unremarkable Spleen: Unremarkable Adrenals/Urinary Tract: Adrenal glands are unremarkable. Kidneys are normal, without renal calculi, focal lesion, or hydronephrosis. Bladder is unremarkable. Stomach/Bowel: Stomach is within normal limits. Appendix appears normal. No evidence of bowel wall thickening, distention, or inflammatory changes. No free intraperitoneal gas or fluid Vascular/Lymphatic: Aortic atherosclerosis. No enlarged abdominal or pelvic lymph nodes. Reproductive: Moderate prostatic hypertrophy Other: No abdominal wall hernia. Status post right inguinal hernia repair Musculoskeletal: No acute bone abnormality. No lytic or blastic bone lesion. Osseous structures are age appropriate. IMPRESSION: 1. Small hiatal hernia. Interval development of mild paraesophageal infiltrative fluid within the posterior mediastinum of unclear etiology but possibly related to esophageal perforation given history of recurrent vomiting. This would be better assessed with dedicated CT examination of the chest utilizing an esophageal perforation protocol utilizing oral contrast only 2. Mild hepatic steatosis. 3. Moderate prostatic hypertrophy. 4. Status post right inguinal hernia repair. No abdominal wall  hernia. Electronically Signed   By: Dorethia Molt M.D.   On: 01/03/2024 02:29   DG Knee Complete 4 Views Right Result Date: 01/02/2024 CLINICAL DATA:  Knee pain EXAM: RIGHT KNEE - COMPLETE 4+ VIEW COMPARISON:  None Available. FINDINGS: No evidence of fracture, dislocation, or joint effusion. No evidence of arthropathy or other focal bone abnormality. Mild prepatellar soft tissue swelling. IMPRESSION: 1. Mild prepatellar soft tissue swelling. No fracture or dislocation. Electronically Signed   By: Dorethia Molt M.D.   On: 01/02/2024 22:41   DG Knee Complete 4 Views Left Result Date: 01/02/2024 CLINICAL DATA:  Knee pain EXAM: LEFT KNEE - COMPLETE 4+ VIEW COMPARISON:  None Available. FINDINGS: No evidence of fracture, dislocation, or joint effusion. No evidence of arthropathy or other focal bone abnormality. Mild prepatellar soft tissue swelling. Vascular calcifications noted. IMPRESSION: 1. Mild prepatellar soft tissue swelling. No fracture or dislocation. Electronically Signed   By: Dorethia Molt M.D.   On: 01/02/2024 22:41   DG Chest 2 View Result Date: 01/02/2024 CLINICAL DATA:  Chest pain EXAM: CHEST - 2 VIEW COMPARISON:  None Available. FINDINGS: Parenchymal scarring within the right apex again noted. Lungs are otherwise clear. No pneumothorax or pleural effusion. Cardiac size within normal limits. Pulmonary vascularity is normal. Acute appearing left fifth rib fracture seen laterally with a small amount of associated pleural thickening IMPRESSION: 1. Acute appearing left fifth rib fracture. No pneumothorax. Electronically Signed   By: Dorethia Molt M.D.   On: 01/02/2024 22:40      Consults: Case discussed with orthopedic surgery concerning debridement, they stated at this time there is no need for orthopedics to consult as this does not involve the joint space and does not constitute as septic arthritis or any joint space involvement..   Reassessment and Plan:   After evaluating patient,  along with review of imaging and consultation with orthopedics, plan is at this time to discharge with pain medication, antibiotics, and referral to wound care center for likely debridement of chronic wounds.  Patient is able to tolerate standing posture with assistance, he ambulates with a walker at baseline, feel that as he is able to tolerate a standing posture is able to take steps in the room with assistance, will be stable to discharge with his normal assistance with a walker.     Final diagnoses:  None  ED Discharge Orders     None          Myriam Dorn BROCKS, [place] 01/29/24 1833    Myriam Dorn BROCKS, PA 01/29/24 1843    Cottie Donnice PARAS, MD 01/29/24 2155

## 2024-03-03 ENCOUNTER — Emergency Department (HOSPITAL_COMMUNITY)

## 2024-03-03 ENCOUNTER — Other Ambulatory Visit: Payer: Self-pay

## 2024-03-03 ENCOUNTER — Encounter (HOSPITAL_COMMUNITY): Payer: Self-pay | Admitting: Emergency Medicine

## 2024-03-03 ENCOUNTER — Emergency Department (HOSPITAL_COMMUNITY)
Admission: EM | Admit: 2024-03-03 | Discharge: 2024-03-04 | Disposition: A | Discharge status: Home / Self Care | Attending: Emergency Medicine | Admitting: Emergency Medicine

## 2024-03-03 DIAGNOSIS — M25561 Pain in right knee: Secondary | ICD-10-CM | POA: Diagnosis present

## 2024-03-03 DIAGNOSIS — S81002A Unspecified open wound, left knee, initial encounter: Secondary | ICD-10-CM | POA: Diagnosis not present

## 2024-03-03 DIAGNOSIS — E876 Hypokalemia: Secondary | ICD-10-CM | POA: Diagnosis not present

## 2024-03-03 DIAGNOSIS — M25562 Pain in left knee: Secondary | ICD-10-CM

## 2024-03-03 DIAGNOSIS — S81001A Unspecified open wound, right knee, initial encounter: Secondary | ICD-10-CM | POA: Diagnosis not present

## 2024-03-03 DIAGNOSIS — J45909 Unspecified asthma, uncomplicated: Secondary | ICD-10-CM | POA: Insufficient documentation

## 2024-03-03 DIAGNOSIS — W010XXA Fall on same level from slipping, tripping and stumbling without subsequent striking against object, initial encounter: Secondary | ICD-10-CM | POA: Insufficient documentation

## 2024-03-03 DIAGNOSIS — K5900 Constipation, unspecified: Secondary | ICD-10-CM | POA: Diagnosis not present

## 2024-03-03 DIAGNOSIS — S81009A Unspecified open wound, unspecified knee, initial encounter: Secondary | ICD-10-CM

## 2024-03-03 LAB — CBC WITH DIFFERENTIAL/PLATELET
Abs Granulocyte: 3.3 K/uL (ref 1.5–6.5)
Abs Immature Granulocytes: 0.01 K/uL (ref 0.00–0.07)
Basophils Absolute: 0 K/uL (ref 0.0–0.1)
Basophils Relative: 1 %
Eosinophils Absolute: 0.1 K/uL (ref 0.0–0.5)
Eosinophils Relative: 2 %
HCT: 35.4 % — ABNORMAL LOW (ref 39.0–52.0)
Hemoglobin: 11.8 g/dL — ABNORMAL LOW (ref 13.0–17.0)
Immature Granulocytes: 0 %
Lymphocytes Relative: 32 %
Lymphs Abs: 2 K/uL (ref 0.7–4.0)
MCH: 31.9 pg (ref 26.0–34.0)
MCHC: 33.3 g/dL (ref 30.0–36.0)
MCV: 95.7 fL (ref 80.0–100.0)
Monocytes Absolute: 0.8 K/uL (ref 0.1–1.0)
Monocytes Relative: 13 %
Neutro Abs: 3.3 K/uL (ref 1.7–7.7)
Neutrophils Relative %: 52 %
Platelets: 253 K/uL (ref 150–400)
RBC: 3.7 MIL/uL — ABNORMAL LOW (ref 4.22–5.81)
RDW: 13.3 % (ref 11.5–15.5)
Smear Review: NORMAL
WBC: 6.3 K/uL (ref 4.0–10.5)
nRBC: 0 % (ref 0.0–0.2)

## 2024-03-03 LAB — COMPREHENSIVE METABOLIC PANEL WITH GFR
ALT: 13 U/L (ref 0–44)
AST: 25 U/L (ref 15–41)
Albumin: 3.7 g/dL (ref 3.5–5.0)
Alkaline Phosphatase: 95 U/L (ref 38–126)
Anion gap: 16 — ABNORMAL HIGH (ref 5–15)
BUN: 14 mg/dL (ref 8–23)
CO2: 23 mmol/L (ref 22–32)
Calcium: 9.6 mg/dL (ref 8.9–10.3)
Chloride: 100 mmol/L (ref 98–111)
Creatinine, Ser: 1.07 mg/dL (ref 0.61–1.24)
GFR, Estimated: >60 mL/min (ref >60)
Glucose, Bld: 86 mg/dL (ref 70–99)
Potassium: 3.2 mmol/L — ABNORMAL LOW (ref 3.5–5.1)
Sodium: 139 mmol/L (ref 135–145)
Total Bilirubin: 0.8 mg/dL (ref 0.0–1.2)
Total Protein: 7.6 g/dL (ref 6.5–8.1)

## 2024-03-03 MED ORDER — POTASSIUM CHLORIDE CRYS ER 20 MEQ PO TBCR
40.0000 meq | EXTENDED_RELEASE_TABLET | Freq: Once | ORAL | Status: AC
  Administered 2024-03-03: 40 meq via ORAL
  Filled 2024-03-03: qty 2

## 2024-03-03 MED ORDER — CEPHALEXIN 500 MG PO CAPS: 500.0000 mg | ORAL_CAPSULE | Freq: Four times a day (QID) | ORAL | 0 refills | Status: DC

## 2024-03-03 MED ORDER — SODIUM CHLORIDE 0.9 % IV BOLUS
1000.0000 mL | Freq: Once | INTRAVENOUS | Status: AC
  Administered 2024-03-03: 1000 mL via INTRAVENOUS

## 2024-03-03 NOTE — ED Provider Notes (Signed)
 [place] EMERGENCY DEPARTMENT AT [facility] Provider Note   CSN: [id number] Arrival date & time: 03/03/24  8079     Patient presents with: Fall and Constipation   Mark [name] is a 73 y.o. male with past medical history of asthma, seizure PD, alcohol abuse, sepsis, tobacco abuse, A-fib presents to Emergency Department via EMS for evaluation of multiple complaints.  Firstly complains of bilateral knee pain, drainage following a fall 2 months ago.  Was attempting to get wheelchair upstairs in his residence when he tripped and fell onto his knees.  Pain worsens with ambulation and weightbearing.  Has been using wheelchair for ambulation for past 2 months.  He was evaluated for this on 01/29/2024 with negative x-rays for fracture, osteomyelitis.  Patient was discharged with Keflex , wound care follow-up.  He completed Keflex  prescription however did not follow-up with wound care.  Also complains of constipation.  Reports last BM was 1300 today and described as pebbles.  Normally has a BM every morning and it normally is the consistency of pebbles.  Has been using MiraLAX  at home without much relief.  Also states that he has not been enough drinking water.  Denies nausea, vomiting, abdominal pain.     Fall  Constipation      Prior to Admission medications   Medication Sig Start Date End Date Taking? Authorizing Provider  cephALEXin  (KEFLEX ) 500 MG capsule Take 1 capsule (500 mg total) by mouth 4 (four) times daily. 03/03/24  Yes Minnie Tinnie BRAVO, PA  acetaminophen  (TYLENOL ) 500 MG tablet Take 2 tablets (1,000 mg total) by mouth every 6 (six) hours as needed for mild pain (pain score 1-3) (or Fever >/= 101). 05/08/23   Tammy Sor, PA-C  albuterol  (PROVENTIL  HFA;VENTOLIN  HFA) 108 (90 Base) MCG/ACT inhaler Inhale 2 puffs into the lungs every 4 (four) hours as needed for wheezing or shortness of breath. 07/03/16   Horton, Charmaine FALCON, MD  atorvastatin  (LIPITOR) 40 MG tablet  Take 1 tablet (40 mg total) by mouth at bedtime. 12/17/23   Addie Perkins, DO  cephALEXin  (KEFLEX ) 500 MG capsule Take 1 capsule (500 mg total) by mouth 4 (four) times daily. 01/29/24   Myriam Dorn BROCKS, PA  folic acid  (FOLVITE ) 1 MG tablet Take 1 tablet (1 mg total) by mouth daily. 01/07/24   Gonfa, Taye T, MD  HYDROcodone -acetaminophen  (NORCO/VICODIN) 5-325 MG tablet Take 2 tablets by mouth every 4 (four) hours as needed. 01/29/24   Myriam Dorn BROCKS, PA  loratadine  (CLARITIN ) 10 MG tablet Take 1 tablet (10 mg total) by mouth daily. 12/18/23   Addie Perkins, DO  losartan  (COZAAR ) 50 MG tablet Take 50 mg by mouth daily. 12/18/23   [provider]  metoprolol  succinate (TOPROL -XL) 100 MG 24 hr tablet Take 1 tablet (100 mg total) by mouth daily. 12/17/23   Addie Perkins, DO  Multiple Vitamin (MULTIVITAMIN WITH MINERALS) TABS tablet Take 1 tablet by mouth daily.    [provider]  omeprazole  (PRILOSEC  OTC) 20 MG tablet Take 20 mg by mouth daily as needed (for reflux or heartburn).    [provider]  pantoprazole  (PROTONIX ) 40 MG tablet Take 1 tablet (40 mg total) by mouth daily. 01/07/24   Gonfa, Taye T, MD  polyethylene glycol (MIRALAX ) 17 g packet Take 17 g by mouth daily. 06/22/23   Ula Prentice SAUNDERS, MD  senna-docusate (SENOKOT-S) 8.6-50 MG tablet Take 1 tablet by mouth at bedtime as needed for moderate constipation. 06/22/23   Ula Prentice SAUNDERS,  MD  SYMBICORT 160-4.5 MCG/ACT inhaler Inhale 2 puffs into the lungs daily.  10/14/19   [provider]  thiamine  (VITAMIN B1) 100 MG tablet Take 1 tablet (100 mg total) by mouth daily. 01/07/24   Gonfa, Taye T, MD    Allergies: Patient has no known allergies.    Review of Systems  Gastrointestinal:  Positive for constipation.    Updated Vital Signs BP (!) 148/87   Pulse 90   Temp 98 F (36.7 C)   Resp 16   Ht 6' 2 (1.88 m)   Wt 72.6 kg   SpO2 93%   BMI 20.54 kg/m   Physical Exam Vitals and nursing note reviewed.   Constitutional:      General: He is not in acute distress.    Appearance: Normal appearance.  HENT:     Head: Normocephalic and atraumatic.  Eyes:     Conjunctiva/sclera: Conjunctivae normal.  Cardiovascular:     Rate and Rhythm: Normal rate.  Pulmonary:     Effort: Pulmonary effort is normal. No respiratory distress.  Abdominal:     General: Bowel sounds are normal. There is no distension.     Palpations: Abdomen is soft.     Tenderness: There is no abdominal tenderness. There is no right CVA tenderness, left CVA tenderness, guarding or rebound.  Skin:    Coloration: Skin is not jaundiced or pale.  Neurological:     Mental Status: He is alert and oriented to person, place, and time. Mental status is at baseline.     (all labs ordered are listed, but only abnormal results are displayed) Labs Reviewed  CBC WITH DIFFERENTIAL/PLATELET - Abnormal; Notable for the following components:      Result Value   RBC 3.70 (*)    Hemoglobin 11.8 (*)    HCT 35.4 (*)    All other components within normal limits  COMPREHENSIVE METABOLIC PANEL WITH GFR - Abnormal; Notable for the following components:   Potassium 3.2 (*)    Anion gap 16 (*)    All other components within normal limits    EKG: None  Radiology: DG Abd Portable 1 View Result Date: 03/03/2024 CLINICAL DATA:  History suggestive of constipation EXAM: PORTABLE ABDOMEN - 1 VIEW COMPARISON:  01/03/2024 FINDINGS: Scattered large and small bowel gas is noted. No obstructive changes are seen. Only minimal residual fecal material is noted. Degenerative changes of lumbar spine are seen. No free air is noted. IMPRESSION: Mild retained fecal material without evidence of constipation. Electronically Signed   By: Oneil Devonshire M.D.   On: 03/03/2024 22:18   DG Knee Complete 4 Views Right Result Date: 03/03/2024 CLINICAL DATA:  Bilateral knee pain after a fall 3 days ago. Scabs on the knees. EXAM: RIGHT KNEE - COMPLETE 4+ VIEW COMPARISON:   01/29/2024 FINDINGS: Right knee appears intact. No significant effusions. No evidence of acute fracture or subluxation. No focal bone lesion or bone destruction. Bone cortex and trabecular architecture appear intact. Soft tissue swelling over the prepatellar space likely representing contusion or bursitis. No radiopaque soft tissue foreign bodies. IMPRESSION: No evidence of acute fracture or dislocation in the right knee. Prepatellar soft tissue swelling may indicate contusion or bursitis. Electronically Signed   By: Elsie Gravely M.D.   On: 03/03/2024 21:22   DG Knee Complete 4 Views Left Result Date: 03/03/2024 CLINICAL DATA:  Bilateral knee pain after a fall 3 days ago. Scabs on the knees. EXAM: LEFT KNEE - COMPLETE 4+ VIEW COMPARISON:  01/29/2024 FINDINGS: Left knee appears intact. No significant effusion. No evidence of acute fracture or subluxation. No focal bone lesion or bone destruction. Bone cortex and trabecular architecture appear intact. Mild prepatellar soft tissue swelling may represent contusion or bursitis. Vascular calcifications. No radiopaque soft tissue foreign bodies. IMPRESSION: No acute bony abnormalities. Prepatellar soft tissue swelling may represent soft tissue contusion or bursitis. Electronically Signed   By: Elsie Gravely M.D.   On: 03/03/2024 21:20     Medications Ordered in the ED  sodium chloride  0.9 % bolus 1,000 mL (0 mLs Intravenous Stopped 03/03/24 2313)  potassium chloride  SA (KLOR-CON  M) CR tablet 40 mEq (40 mEq Oral Given 03/03/24 2311)                                    Medical Decision Making Amount and/or Complexity of Data Reviewed Labs: ordered. Radiology: ordered.  Risk Prescription drug management.   Patient presents to the ED for concern of bilateral knee pain, constipation, this involves an extensive number of treatment options, and is a complaint that carries with it a high risk of complications and morbidity.  The differential diagnosis  includes fracture, contusion, dislocation, bursitis, septic joint, chronic wounds, cellulitis, constipation, impaction, obstruction, perforation   Co morbidities that complicate the patient evaluation  See HPI   Additional history obtained:  Additional history obtained from Nursing and Outside Medical Records   External records from outside source obtained and reviewed including triage note, ED note from 01/29/2024   Lab Tests:  I Ordered, and personally interpreted labs.  The pertinent results include:   Mild hypokalemia of 3.2 Hgb 11.8 per baseline WBC 6.3.  Was 6.54-month ago   Imaging Studies ordered:  I ordered imaging studies including abd XR, knee XR  I independently visualized and interpreted imaging which showed  Mild retained fecal material without evidence of constipation  No evidence of acute fracture or dislocation in the right knee. Prepatellar soft tissue swelling may indicate contusion or bursitis I agree with the radiologist interpretation     Medicines ordered and prescription drug management:  I ordered medication including NS, Keflex , potassium for hydration knee wound, hypokalemia Reevaluation of the patient after these medicines showed that the patient improved I have reviewed the patients home medicines and have made adjustments as needed    Problem List / ED Course:  Bilateral knee pain No new fall X-rays continued to note no fracture nor dislocation nor osteomyelitis Decreased in size since being evaluated 1 month ago for this as he completed a Keflex  prescription however following taking off bandage overlying eschar came off showing underlying pus, drainage.  There is no significant swelling but right knee is warmer when compared to left.  No overlying erythema, streaking.  No significant swelling or fluctuance felt knee and do not think that anything would be able to be aspirated.  Likely chronic wound that is complicated with underlying  cellulitis or infection.  No signs of abscess nor fluctuance Did not follow-up with wound care as recommended last ED visit-educated patient on importance of following up with them Not a diabetic.  No fever or leukocytosis do not think that he needs IV antibiotics. WBC 6.3 which is unchanged from WBC of 6.2 obtained last month with worse wounds. low suspicion for sepsis as he has not been tachycardic nor febrile Constipation No abd tenderness nor complaints of abd pain. PE notable for mildly  tense abdomen which may be due to flexing Abdominal x-ray wo signs of obstruction nor constipation Passes p.o. challenge Will provide fluids as he reports he has not been drinking enough water Hypokalemia 3.2 Provided p.o. supplementation Will have him follow-up with PCP to monitor and trend   Reevaluation:  After the interventions noted above, I reevaluated the patient and found that they have :improved   Social Determinants of Health:  Tobacco abuse   Dispostion:  After consideration of the diagnostic results and the patients response to treatment, I feel that the patent would benefit from outpatient management symptomatic care and wound care follow-up.   Discussed ED workup, disposition, return to ED precautions with patient who expresses understanding agrees with plan.  All questions answered to their satisfaction.  They are agreeable to plan.  Discharge instructions provided on paperwork  Final diagnoses:  Hypokalemia  Pain in both knees, unspecified chronicity  Open wound of knee, unspecified laterality, initial encounter    ED Discharge Orders          Ordered    cephALEXin  (KEFLEX ) 500 MG capsule  4 times daily        03/03/24 2313             Minnie Tinnie BRAVO, PA 03/03/24 2317    Darra Fonda MATSU, MD 03/07/24 [phone]

## 2024-03-03 NOTE — ED Triage Notes (Signed)
 Pt presents to the ED via [facility] with multiple complaints - bilateral knee pain following a fall 3 days ago and constipation. Pt has old scabs on his knees from a fall over a week ago. Pt notes not drinking a lot of water - causing him to have some intermittent hard stools. A&Ox4 at this time. Denies LOC, hitting his head, CP or SOB.

## 2024-03-03 NOTE — Discharge Instructions (Addendum)
 Thank you for letting us  evaluate you today.  Your x-ray of your abdomen did not show any large stool burden.  It does not appear that you are constipated nor have a bowel obstruction.  Please continue to drink plenty of water, have high-fiber diet to avoid constipation in future  Your x-rays of your knees did not show any fracture or infection.  I provided you with antibiotic for wound on knee and I need you to follow-up with wound center within the next 2 weeks.  Please make sure to take entire antibiotic and do not miss a dose  Your labs did not show any elevated white blood cell count to indicate inflammation or infection.  You have a mildly decreased potassium which we have supplemented here in emergency department.  Please follow-up with PCP regarding this.  Return to Emergency Department if you experience significant worsening of swelling, pus, drainage from knees especially if red hot and swollen and fever

## 2024-05-20 ENCOUNTER — Observation Stay (HOSPITAL_COMMUNITY)
Admission: EM | Admit: 2024-05-20 | Discharge: 2024-05-23 | Disposition: A | Discharge status: Home / Self Care | Attending: Internal Medicine | Admitting: Internal Medicine

## 2024-05-20 ENCOUNTER — Emergency Department (HOSPITAL_COMMUNITY)

## 2024-05-20 ENCOUNTER — Encounter (HOSPITAL_COMMUNITY): Payer: Self-pay

## 2024-05-20 DIAGNOSIS — F17210 Nicotine dependence, cigarettes, uncomplicated: Secondary | ICD-10-CM | POA: Insufficient documentation

## 2024-05-20 DIAGNOSIS — W19XXXA Unspecified fall, initial encounter: Principal | ICD-10-CM

## 2024-05-20 DIAGNOSIS — R519 Headache, unspecified: Secondary | ICD-10-CM | POA: Diagnosis not present

## 2024-05-20 DIAGNOSIS — Z79899 Other long term (current) drug therapy: Secondary | ICD-10-CM | POA: Insufficient documentation

## 2024-05-20 DIAGNOSIS — E440 Moderate protein-calorie malnutrition: Secondary | ICD-10-CM | POA: Diagnosis not present

## 2024-05-20 DIAGNOSIS — J45909 Unspecified asthma, uncomplicated: Secondary | ICD-10-CM | POA: Insufficient documentation

## 2024-05-20 DIAGNOSIS — I110 Hypertensive heart disease with heart failure: Secondary | ICD-10-CM | POA: Insufficient documentation

## 2024-05-20 DIAGNOSIS — I4581 Long QT syndrome: Secondary | ICD-10-CM | POA: Insufficient documentation

## 2024-05-20 DIAGNOSIS — E876 Hypokalemia: Secondary | ICD-10-CM | POA: Diagnosis present

## 2024-05-20 DIAGNOSIS — I480 Paroxysmal atrial fibrillation: Secondary | ICD-10-CM | POA: Insufficient documentation

## 2024-05-20 DIAGNOSIS — I2510 Atherosclerotic heart disease of native coronary artery without angina pectoris: Secondary | ICD-10-CM | POA: Insufficient documentation

## 2024-05-20 DIAGNOSIS — Z7901 Long term (current) use of anticoagulants: Secondary | ICD-10-CM | POA: Insufficient documentation

## 2024-05-20 DIAGNOSIS — I5042 Chronic combined systolic (congestive) and diastolic (congestive) heart failure: Secondary | ICD-10-CM | POA: Diagnosis not present

## 2024-05-20 LAB — CBC WITH DIFFERENTIAL/PLATELET
Abs Immature Granulocytes: 0.01 K/uL (ref 0.00–0.07)
Basophils Absolute: 0 K/uL (ref 0.0–0.1)
Basophils Relative: 0 %
Eosinophils Absolute: 0 K/uL (ref 0.0–0.5)
Eosinophils Relative: 1 %
HCT: 39.4 % (ref 39.0–52.0)
Hemoglobin: 13.7 g/dL (ref 13.0–17.0)
Immature Granulocytes: 0 %
Lymphocytes Relative: 39 %
Lymphs Abs: 2.7 K/uL (ref 0.7–4.0)
MCH: 32.5 pg (ref 26.0–34.0)
MCHC: 34.8 g/dL (ref 30.0–36.0)
MCV: 93.4 fL (ref 80.0–100.0)
Monocytes Absolute: 0.6 K/uL (ref 0.1–1.0)
Monocytes Relative: 9 %
Neutro Abs: 3.5 K/uL (ref 1.7–7.7)
Neutrophils Relative %: 51 %
Platelets: 237 K/uL (ref 150–400)
RBC: 4.22 MIL/uL (ref 4.22–5.81)
RDW: 14 % (ref 11.5–15.5)
WBC: 6.9 K/uL (ref 4.0–10.5)
nRBC: 0 % (ref 0.0–0.2)

## 2024-05-20 LAB — I-STAT CHEM 8, ED
BUN: 9 mg/dL (ref 8–23)
Calcium, Ion: 1.06 mmol/L — ABNORMAL LOW (ref 1.15–1.40)
Chloride: 101 mmol/L (ref 98–111)
Creatinine, Ser: 1.3 mg/dL — ABNORMAL HIGH (ref 0.61–1.24)
Glucose, Bld: 91 mg/dL (ref 70–99)
HCT: 42 % (ref 39.0–52.0)
Hemoglobin: 14.3 g/dL (ref 13.0–17.0)
Potassium: 2.9 mmol/L — ABNORMAL LOW (ref 3.5–5.1)
Sodium: 142 mmol/L (ref 135–145)
TCO2: 25 mmol/L (ref 22–32)

## 2024-05-20 LAB — BASIC METABOLIC PANEL WITH GFR
Anion gap: 18 — ABNORMAL HIGH (ref 5–15)
BUN: 8 mg/dL (ref 8–23)
CO2: 22 mmol/L (ref 22–32)
Calcium: 8.7 mg/dL — ABNORMAL LOW (ref 8.9–10.3)
Chloride: 101 mmol/L (ref 98–111)
Creatinine, Ser: 0.95 mg/dL (ref 0.61–1.24)
GFR, Estimated: >60 mL/min (ref >60)
Glucose, Bld: 92 mg/dL (ref 70–99)
Potassium: 2.9 mmol/L — ABNORMAL LOW (ref 3.5–5.1)
Sodium: 141 mmol/L (ref 135–145)

## 2024-05-20 MED ORDER — MAGNESIUM SULFATE 2 GM/50ML IV SOLN
2.0000 g | Freq: Once | INTRAVENOUS | Status: AC
  Administered 2024-05-21: 2 g via INTRAVENOUS
  Filled 2024-05-20: qty 50

## 2024-05-20 MED ORDER — POTASSIUM CHLORIDE 10 MEQ/100ML IV SOLN
10.0000 meq | Freq: Once | INTRAVENOUS | Status: AC
  Administered 2024-05-21: 10 meq via INTRAVENOUS
  Filled 2024-05-20: qty 100

## 2024-05-20 MED ORDER — ALBUTEROL SULFATE HFA 108 (90 BASE) MCG/ACT IN AERS
2.0000 | INHALATION_SPRAY | Freq: Once | RESPIRATORY_TRACT | Status: AC
  Administered 2024-05-21: 2 via RESPIRATORY_TRACT
  Filled 2024-05-20: qty 6.7

## 2024-05-20 MED ORDER — LACTATED RINGERS IV BOLUS
1000.0000 mL | Freq: Once | INTRAVENOUS | Status: AC
  Administered 2024-05-21: 1000 mL via INTRAVENOUS

## 2024-05-20 NOTE — ED Provider Triage Note (Signed)
 Emergency Medicine Provider Triage Evaluation Note  Mark [name] , a 73 y.o. male  was evaluated in triage.  Pt complains of fall.  Patient reports that he has a history of frequent falls.  Reports that he was in his kitchen when he experienced a ground-level fall, reports nonadherence to his home Eliquis .  Denies hitting his head, loss of consciousness, pain to any particular part of his body.  Reports that he has chronic shortness of breath related to his asthma, however denies any shortness of breath beyond baseline on my exam.  Review of Systems  Positive: Fall, chronic shortness of breath Negative: Chest pain, headache, neck pain, back pain  Physical Exam  BP 112/71   Pulse 87   Temp 97.7 F (36.5 C)   Resp 14   Ht 6' 2 (1.88 m)   Wt 72.6 kg   SpO2 98%   BMI 20.54 kg/m  Gen:   Awake, no distress   Resp:  Normal effort  MSK:   Moves extremities without difficulty   Medical Decision Making  Medically screening exam initiated at 2:12 PM.  Appropriate orders placed.  Deigo Alonso was informed that the remainder of the evaluation will be completed by another provider, this initial triage assessment does not replace that evaluation, and the importance of remaining in the ED until their evaluation is complete.   Rogelia Jerilynn RAMAN, MD 05/20/24 [phone]

## 2024-05-20 NOTE — ED Triage Notes (Signed)
 Per EMS, Pt, from home, presents after sliding out of bed and hitting L side head on floor.  No laceration or hematoma noted.  Denies LOC.  Pain score 4/10.   Pt is supposed to take Eliquis , but is unsure the last time he took it.

## 2024-05-20 NOTE — ED Provider Notes (Signed)
 [place] EMERGENCY DEPARTMENT AT [facility] Provider Note   CSN: [id number] Arrival date & time: 05/20/24  1341     Patient presents with: Mark [name] is a 73 y.o. male.   73 year old male with history of asthma, EtOH abuse, combined CHF, CAD, paroxysmal A-fib on Eliquis , smoking, balance issue caused multiple falls and hypertension who presents ER today after another fall.  Patient states he falls often.  He uses a walker at baseline.  He was going to get a bed today and fell striking the side of his head on a piece of furniture in his room.  No syncope, nausea, vomiting, persistent headache.  Patient has had trouble get off the floor and his wife called EMS and EMS brought him here for further evaluation.  Patient states he is also having an issue with his asthma right now since he has been here for so long without getting a breathing treatment.  No fever but does have cough and shortness of breath.  Smokes a couple cigarettes a day.  Patient is intermittently compliant with medications.  Previous note states was put on Eliquis  however he denies any blood thinners.  It is not on his medication list so it is unclear.   Fall       Prior to Admission medications   Medication Sig Start Date End Date Taking? Authorizing Provider  acetaminophen  (TYLENOL ) 500 MG tablet Take 2 tablets (1,000 mg total) by mouth every 6 (six) hours as needed for mild pain (pain score 1-3) (or Fever >/= 101). 05/08/23   Tammy Sor, PA-C  albuterol  (PROVENTIL  HFA;VENTOLIN  HFA) 108 (90 Base) MCG/ACT inhaler Inhale 2 puffs into the lungs every 4 (four) hours as needed for wheezing or shortness of breath. 07/03/16   Horton, Charmaine FALCON, MD  atorvastatin  (LIPITOR) 40 MG tablet Take 1 tablet (40 mg total) by mouth at bedtime. 12/17/23   Addie Perkins, DO  cephALEXin  (KEFLEX ) 500 MG capsule Take 1 capsule (500 mg total) by mouth 4 (four) times daily. 01/29/24   Myriam Dorn BROCKS, PA  cephALEXin   (KEFLEX ) 500 MG capsule Take 1 capsule (500 mg total) by mouth 4 (four) times daily. 03/03/24   Minnie Tinnie BRAVO, PA  folic acid  (FOLVITE ) 1 MG tablet Take 1 tablet (1 mg total) by mouth daily. 01/07/24   Gonfa, Taye T, MD  HYDROcodone -acetaminophen  (NORCO/VICODIN) 5-325 MG tablet Take 2 tablets by mouth every 4 (four) hours as needed. 01/29/24   Myriam Dorn BROCKS, PA  loratadine  (CLARITIN ) 10 MG tablet Take 1 tablet (10 mg total) by mouth daily. 12/18/23   Addie Perkins, DO  losartan  (COZAAR ) 50 MG tablet Take 50 mg by mouth daily. 12/18/23   [provider]  metoprolol  succinate (TOPROL -XL) 100 MG 24 hr tablet Take 1 tablet (100 mg total) by mouth daily. 12/17/23   Addie Perkins, DO  Multiple Vitamin (MULTIVITAMIN WITH MINERALS) TABS tablet Take 1 tablet by mouth daily.    [provider]  omeprazole  (PRILOSEC  OTC) 20 MG tablet Take 20 mg by mouth daily as needed (for reflux or heartburn).    [provider]  pantoprazole  (PROTONIX ) 40 MG tablet Take 1 tablet (40 mg total) by mouth daily. 01/07/24   Gonfa, Taye T, MD  polyethylene glycol (MIRALAX ) 17 g packet Take 17 g by mouth daily. 06/22/23   Ula Prentice SAUNDERS, MD  senna-docusate (SENOKOT-S) 8.6-50 MG tablet Take 1 tablet by mouth at bedtime as needed for moderate constipation. 06/22/23  Ula Prentice SAUNDERS, MD  SYMBICORT 160-4.5 MCG/ACT inhaler Inhale 2 puffs into the lungs daily.  10/14/19   [provider]  thiamine  (VITAMIN B1) 100 MG tablet Take 1 tablet (100 mg total) by mouth daily. 01/07/24   Gonfa, Taye T, MD    Allergies: Patient has no known allergies.    Review of Systems  Updated Vital Signs BP (!) 153/97 (BP Location: Right Arm)   Pulse 90   Temp 98.4 F (36.9 C) (Oral)   Resp 20   Ht 6' 2 (1.88 m)   Wt 72.6 kg   SpO2 94%   BMI 20.54 kg/m   Physical Exam Vitals and nursing note reviewed.  Constitutional:      Appearance: He is well-developed.  HENT:     Head: Normocephalic and atraumatic.      Comments: No obvious skull fracture or hematoma to area where he hit his head Cardiovascular:     Rate and Rhythm: Normal rate.     Comments: Strong DP pulses no obvious lower extremity edema Pulmonary:     Effort: Pulmonary effort is normal. No respiratory distress.  Abdominal:     General: There is no distension.  Musculoskeletal:        General: Normal range of motion.     Cervical back: Normal range of motion.  Skin:    Comments: Multiple wounds of various stages of healing to lower extremities.  None that appear infected.  Neurological:     Mental Status: He is alert.     Comments: No altered mental status, able to give full seemingly accurate history.  Face is symmetric, EOM's intact, pupils equal and reactive, vision intact, tongue and uvula midline without deviation. Upper and Lower extremity motor 5/5, intact pain perception in distal extremities.      (all labs ordered are listed, but only abnormal results are displayed) Labs Reviewed  BASIC METABOLIC PANEL WITH GFR - Abnormal; Notable for the following components:      Result Value   Potassium 2.9 (*)    Calcium  8.7 (*)    Anion gap 18 (*)    All other components within normal limits  I-STAT CHEM 8, ED - Abnormal; Notable for the following components:   Potassium 2.9 (*)    Creatinine, Ser 1.30 (*)    Calcium , Ion 1.06 (*)    All other components within normal limits  CBC WITH DIFFERENTIAL/PLATELET  BASIC METABOLIC PANEL WITH GFR    EKG: None  Radiology: No results found.   .Critical Care  Performed by: Lorette Mayo, MD Authorized by: Lorette Mayo, MD   Critical care provider statement:    Critical care time (minutes):  30   Critical care was necessary to treat or prevent imminent or life-threatening deterioration of the following conditions:  Metabolic crisis   Critical care was time spent personally by me on the following activities:  Development of treatment plan with patient or surrogate,  discussions with consultants, evaluation of patient's response to treatment, examination of patient, ordering and review of laboratory studies, ordering and review of radiographic studies, ordering and performing treatments and interventions, pulse oximetry, re-evaluation of patient's condition and review of old charts    Medications Ordered in the ED  potassium chloride  10 mEq in 100 mL IVPB (has no administration in time range)  albuterol  (VENTOLIN  HFA) 108 (90 Base) MCG/ACT inhaler 2 puff (has no administration in time range)  magnesium  sulfate IVPB 2 g 50 mL (has no administration in  time range)  lactated ringers  bolus 1,000 mL (has no administration in time range)                                    Medical Decision Making Amount and/or Complexity of Data Reviewed Labs: ordered. Radiology: ordered. ECG/medicine tests: ordered.  Risk Prescription drug management. Decision regarding hospitalization.   Patient with recurrent falls and another 1 today.  Patient actually called EMS to help him up.  It is unclear whether or not he is on anticoagulation so we will CT his head since he did hit it although I have low suspicion since he has been in for 10 hours without headache or other neurologic complaint.  Multiple wounds on his lower extremities consistent with his history of falls however must have some type of delayed healing as he states that someone may have been there for months.  No superimposed infection.  He is hypokalemic on his labs so we will check magnesium  and give him some potassium.  Mild AKI but nothing compared to where he has been in the past we will give a liter of fluids slowly as he has a history of heart failure..  Persistent hypokalemia and even slightly worsening after IV treatment. Will admit for monitoring and furtehr repletion/exploration of the cause.      Final diagnoses:  None    ED Discharge Orders     None          Casimira Sutphin, Selinda, MD 05/21/24  [phone]

## 2024-05-21 ENCOUNTER — Other Ambulatory Visit: Payer: Self-pay

## 2024-05-21 ENCOUNTER — Encounter (HOSPITAL_COMMUNITY): Payer: Self-pay | Admitting: Internal Medicine

## 2024-05-21 DIAGNOSIS — E876 Hypokalemia: Secondary | ICD-10-CM

## 2024-05-21 LAB — BASIC METABOLIC PANEL WITH GFR
Anion gap: 10 (ref 5–15)
BUN: 7 mg/dL — ABNORMAL LOW (ref 8–23)
CO2: 24 mmol/L (ref 22–32)
Calcium: 8.4 mg/dL — ABNORMAL LOW (ref 8.9–10.3)
Chloride: 102 mmol/L (ref 98–111)
Creatinine, Ser: 0.85 mg/dL (ref 0.61–1.24)
GFR, Estimated: >60 mL/min (ref >60)
Glucose, Bld: 108 mg/dL — ABNORMAL HIGH (ref 70–99)
Potassium: 3.5 mmol/L (ref 3.5–5.1)
Sodium: 136 mmol/L (ref 135–145)

## 2024-05-21 LAB — POTASSIUM: Potassium: 2.8 mmol/L — ABNORMAL LOW (ref 3.5–5.1)

## 2024-05-21 LAB — CBC
HCT: 36.3 % — ABNORMAL LOW (ref 39.0–52.0)
Hemoglobin: 12.5 g/dL — ABNORMAL LOW (ref 13.0–17.0)
MCH: 32.8 pg (ref 26.0–34.0)
MCHC: 34.4 g/dL (ref 30.0–36.0)
MCV: 95.3 fL (ref 80.0–100.0)
Platelets: 213 K/uL (ref 150–400)
RBC: 3.81 MIL/uL — ABNORMAL LOW (ref 4.22–5.81)
RDW: 14.3 % (ref 11.5–15.5)
WBC: 4.8 K/uL (ref 4.0–10.5)
nRBC: 0 % (ref 0.0–0.2)

## 2024-05-21 LAB — PHOSPHORUS: Phosphorus: 2.6 mg/dL (ref 2.5–4.6)

## 2024-05-21 LAB — MAGNESIUM
Magnesium: 2.4 mg/dL (ref 1.7–2.4)
Magnesium: 1.7 mg/dL (ref 1.7–2.4)

## 2024-05-21 MED ORDER — ACETAMINOPHEN 500 MG PO TABS
500.0000 mg | ORAL_TABLET | Freq: Four times a day (QID) | ORAL | Status: DC | PRN
  Administered 2024-05-21: 500 mg via ORAL
  Filled 2024-05-21: qty 1

## 2024-05-21 MED ORDER — ALBUTEROL SULFATE (2.5 MG/3ML) 0.083% IN NEBU: 2.5000 mL | INHALATION_SOLUTION | RESPIRATORY_TRACT | Status: DC | PRN

## 2024-05-21 MED ORDER — POTASSIUM CHLORIDE 20 MEQ PO PACK
40.0000 meq | PACK | Freq: Two times a day (BID) | ORAL | Status: DC
  Administered 2024-05-21 – 2024-05-23 (×6): 40 meq via ORAL
  Filled 2024-05-21 (×6): qty 2

## 2024-05-21 MED ORDER — MAGNESIUM OXIDE -MG SUPPLEMENT 400 (240 MG) MG PO TABS
400.0000 mg | ORAL_TABLET | Freq: Once | ORAL | Status: AC
  Administered 2024-05-21: 400 mg via ORAL
  Filled 2024-05-21: qty 1

## 2024-05-21 MED ORDER — POTASSIUM CHLORIDE 10 MEQ/100ML IV SOLN
10.0000 meq | INTRAVENOUS | Status: AC
  Administered 2024-05-21 (×3): 10 meq via INTRAVENOUS
  Filled 2024-05-21 (×3): qty 100

## 2024-05-21 MED ORDER — ENOXAPARIN SODIUM 40 MG/0.4ML IJ SOSY
40.0000 mg | PREFILLED_SYRINGE | Freq: Every day | INTRAMUSCULAR | Status: DC
  Administered 2024-05-21 – 2024-05-23 (×3): 40 mg via SUBCUTANEOUS
  Filled 2024-05-21 (×3): qty 0.4

## 2024-05-21 MED ORDER — POLYETHYLENE GLYCOL 3350 17 G PO PACK: 17.0000 g | PACK | Freq: Every day | ORAL | Status: DC | PRN

## 2024-05-21 MED ORDER — CARVEDILOL 6.25 MG PO TABS
6.2500 mg | ORAL_TABLET | Freq: Two times a day (BID) | ORAL | Status: DC
  Administered 2024-05-21 (×2): 6.25 mg via ORAL
  Filled 2024-05-21: qty 2
  Filled 2024-05-21: qty 1

## 2024-05-21 MED ORDER — MELATONIN 5 MG PO TABS
5.0000 mg | ORAL_TABLET | Freq: Every evening | ORAL | Status: DC | PRN
Start: 2024-05-21 — End: 2024-05-23
  Filled 2024-05-21: qty 1

## 2024-05-21 MED ORDER — ALBUTEROL SULFATE (2.5 MG/3ML) 0.083% IN NEBU
3.0000 mL | INHALATION_SOLUTION | RESPIRATORY_TRACT | Status: DC | PRN
  Administered 2024-05-22: 3 mL via RESPIRATORY_TRACT
  Filled 2024-05-21 (×2): qty 3

## 2024-05-21 MED ORDER — PROCHLORPERAZINE EDISYLATE 10 MG/2ML IJ SOLN: 5.0000 mg | Freq: Four times a day (QID) | INTRAMUSCULAR | Status: DC | PRN

## 2024-05-21 NOTE — Evaluation (Signed)
 Physical Therapy Evaluation Patient Details Name: Mark [name] MRN: [id number] DOB: September 22, 1950 Today's Date: 05/21/2024  History of Present Illness  Mark [name] is a 73 y.o. male admitted with fall.   He uses a rolling walker at baseline.  He was going to bed and fell and hit the side of his head on a piece of furniture in his bedroom. PMH: hypertension, asthma, alcohol and tobacco abuse, combined diastolic and systolic CHF, paroxysmal A-fib on Eliquis , multiple falls  Clinical Impression  Pt admitted with above diagnosis. Pt was unable to ambulate safely with bil UE support. Pt has had multiple falls at home even with use of RW per pt. Pt would benefit from post acute rehab < 3 hours day.  Has limited support at home. Will follow acutely.  Pt currently with functional limitations due to the deficits listed below (see PT Problem List). Pt will benefit from acute skilled PT to increase their independence and safety with mobility to allow discharge.           If plan is discharge home, recommend the following: A little help with walking and/or transfers;A little help with bathing/dressing/bathroom;Assistance with cooking/housework;Assist for transportation;Help with stairs or ramp for entrance   Can travel by private vehicle   No    Equipment Recommendations None recommended by PT  Recommendations for Other Services       Functional Status Assessment Patient has had a recent decline in their functional status and demonstrates the ability to make significant improvements in function in a reasonable and predictable amount of time.     Precautions / Restrictions Precautions Precautions: Fall Restrictions Weight Bearing Restrictions Per Provider Order: No      Mobility  Bed Mobility Overal bed mobility: Independent                  Transfers Overall transfer level: Needs assistance Equipment used: 2 person hand held assist, Rollator (4 wheels) Transfers: Sit to/from  Stand Sit to Stand: Min assist           General transfer comment: Pt needs steadying assist for safety.  Poor postural stability.    Ambulation/Gait Ambulation/Gait assistance: Min assist Gait Distance (Feet): 3 Feet Assistive device: Rollator (4 wheels), 2 person hand held assist Gait Pattern/deviations: Step-to pattern, Decreased step length - right, Decreased step length - left, Knee flexed in stance - right, Knee flexed in stance - left, Staggering left, Staggering right, Trunk flexed, Wide base of support   Gait velocity interpretation: <1.31 ft/sec, indicative of household ambulator   General Gait Details: Pt unable to take more than a few steps and needing min assist for stability. Pt states his LEs hurt and are weak.  Had pt step back to stretcher for safety.  Stairs            Wheelchair Mobility     Tilt Bed    Modified Rankin (Stroke Patients Only)       Balance Overall balance assessment: Needs assistance Sitting-balance support: No upper extremity supported, Feet supported Sitting balance-Leahy Scale: Good     Standing balance support: Bilateral upper extremity supported, During functional activity, Single extremity supported Standing balance-Leahy Scale: Poor                               Pertinent Vitals/Pain Pain Assessment Pain Assessment: Faces Faces Pain Scale: Hurts even more Pain Location: bil LEs Pain Descriptors / Indicators: Aching, Grimacing, Guarding  Pain Intervention(s): Limited activity within patient's tolerance, Monitored during session, Repositioned    Home Living Family/patient expects to be discharged to:: Private residence Living Arrangements: Alone Available Help at Discharge: Friend(s);Available PRN/intermittently;Family;Personal care attendant (3 days week, a few hours - cooks and cleans for pt) Type of Home: Apartment Home Access: Stairs to enter Entrance Stairs-Rails: None Entrance Stairs-Number of  Steps: 2   Home Layout: One level Home Equipment: Agricultural Consultant (2 wheels);Rollator (4 wheels) Additional Comments: Saw bug crawling on floor and caught in specimen jar and NT to get it checked. Looked like a cockroach.    Prior Function Prior Level of Function : Needs assist;History of Falls (last six months)             Mobility Comments: uses rollator in the community, reports 10 falls within last 6 months ADLs Comments: Pt sponge bathes; reports shower is broken. Uses the bus for transportation. Pt states that he manages his own medications, pt reports can indep self feed finger food, difficulty with utensils     Extremity/Trunk Assessment   Upper Extremity Assessment Upper Extremity Assessment: Defer to OT evaluation    Lower Extremity Assessment Lower Extremity Assessment: Generalized weakness       Communication   Communication Communication: No apparent difficulties    Cognition Arousal: Alert Behavior During Therapy: WFL for tasks assessed/performed   PT - Cognitive impairments: No apparent impairments                         Following commands: Intact       Cueing       General Comments      Exercises General Exercises - Lower Extremity Long Arc Quad: AROM, Both, 5 reps, Seated   Assessment/Plan    PT Assessment Patient needs continued PT services  PT Problem List Decreased activity tolerance;Decreased balance;Decreased mobility;Decreased knowledge of use of DME;Decreased safety awareness;Decreased knowledge of precautions;Pain       PT Treatment Interventions DME instruction;Gait training;Therapeutic activities;Functional mobility training;Patient/family education;Therapeutic exercise;Balance training    PT Goals (Current goals can be found in the Care Plan section)  Acute Rehab PT Goals Patient Stated Goal: to go home PT Goal Formulation: With patient Time For Goal Achievement: 06/04/24 Potential to Achieve Goals: Good     Frequency Min 2X/week     Co-evaluation               AM-PAC PT 6 Clicks Mobility  Outcome Measure Help needed turning from your back to your side while in a flat bed without using bedrails?: None Help needed moving from lying on your back to sitting on the side of a flat bed without using bedrails?: None Help needed moving to and from a bed to a chair (including a wheelchair)?: A Little Help needed standing up from a chair using your arms (e.g., wheelchair or bedside chair)?: A Lot Help needed to walk in hospital room?: Total Help needed climbing 3-5 steps with a railing? : Total 6 Click Score: 15    End of Session Equipment Utilized During Treatment: Gait belt Activity Tolerance: Patient tolerated treatment well Patient left: with call bell/phone within reach (on stretcher) Nurse Communication: Mobility status (NT aware of bug in room and caught bug in container) PT Visit Diagnosis: Muscle weakness (generalized) (M62.81)    Time: 0946-1000 PT Time Calculation (min) (ACUTE ONLY): 14 min   Charges:   PT Evaluation $PT Eval Moderate Complexity: 1 Mod   PT  General Charges $$ ACUTE PT VISIT: 1 Visit         [facility], [place] M,PT Acute Rehab Services [phone]   Stephane JULIANNA Bevel 05/21/2024, 12:30 PM

## 2024-05-21 NOTE — Plan of Care (Signed)

## 2024-05-21 NOTE — Plan of Care (Signed)

## 2024-05-21 NOTE — ED Notes (Signed)
 Patient given Turkey sandwich and soda per Dr. Shona

## 2024-05-21 NOTE — Progress Notes (Addendum)
 Transition of Care [facility]) - Inpatient Brief Assessment   Patient Details  Name: Mark [name] MRN: [id number] Date of Birth: 1951/01/06  [facility] of [facility] CM/SW Contact:    Rosaline JONELLE Joe, RN Phone Number: 05/21/2024, 4:10 PM   Clinical Narrative: CM met with the patient at the bedside to discuss IP Care management needs. Patient admitted to the hospital with uncontrolled diabetes and declines SNF placement.  Patient states that he lives alone but has hired a private pay caregiver/friend named Andres that assists in the home 3 x per week.  Patient states that Andres assists with some meal preparation.    Resources provided in AVs to include OP counseling for ETOH use, Medicaid transportation resources, Food insecurity and smoking cessation.  DME in the home includes RW and Rolator.  Patient declines SNF placement but was agreeable to home health upon discharge to home.  Patient was offered Medicare choice regarding home health services and patient did not have a preference.  Patient was active with [facility] recently.  HH orders for PT, OT, RN placed - to be co-signed by MD.  Burnard, CM with [facility] accepted.  Patient declined smoking and drug use but states that he drinks 1 quart of beer every other day but declines need for OP counseling resources.  MD updated that he wishes to return home and not go to SNF for rehabilitation services.   Transition of Care Asessment: Insurance and Status: (P) Insurance coverage has been reviewed Patient has primary care physician: (P) Yes Home environment has been reviewed: (P) from home alone Prior level of function:: (P) self Prior/Current Home Services: (P) No current home services (Patient has private pay caregiver at the home 3 x per week) Social Drivers of Health Review: (P) SDOH reviewed interventions complete Readmission risk has been reviewed: (P) Yes Transition of care needs: (P) transition of care needs  identified, TOC will continue to follow

## 2024-05-21 NOTE — Evaluation (Signed)
 Occupational Therapy Evaluation Patient Details Name: Mark [name] MRN: [id number] DOB: 1950/09/07 Today's Date: 05/21/2024   History of Present Illness   Mark [name] is a 73 yr old male admitted with fall at home.  PMH: hypertension, asthma, alcohol and tobacco abuse, combined diastolic and systolic CHF, paroxysmal A-fib on Eliquis , falls     Clinical Impressions The patient is currently presenting slightly below his baseline level of functioning for self-care management. He is limited by the below listed deficits (see OT problem list). During the session today, he required CGA for most tasks, including sit to stand, ambulating to and from the bathroom in his room without an assistive device, toileting management at bathroom level, and grooming in standing at the sink. He reported having soreness in his R knee from an old fall, as well as a history of falling approximately once per month, stating, I just lose my balance.  He will benefit from further OT services to maximize his safety and independence with self-care tasks. He stated he will be returning home at discharge & adamantly declined the option for SNF rehab. Home health OT is therefore recommended.      If plan is discharge home, recommend the following:   Assistance with cooking/housework;Help with stairs or ramp for entrance;Assist for transportation     Functional Status Assessment   Patient has had a recent decline in their functional status and demonstrates the ability to make significant improvements in function in a reasonable and predictable amount of time.     Equipment Recommendations   None recommended by OT     Recommendations for Other Services         Precautions/Restrictions   Precautions Precautions: Fall     Mobility Bed Mobility      General bed mobility comments: he was received seated EOB    Transfers Overall transfer level: Needs assistance Equipment used:  None Transfers: Sit to/from Stand Sit to Stand: Contact guard assist, From elevated surface             Balance     Sitting balance-Leahy Scale: Good       Standing balance-Leahy Scale:  (CGA)           ADL either performed or assessed with clinical judgement   ADL Overall ADL's : Needs assistance/impaired Eating/Feeding: Independent;Sitting   Grooming: Contact guard assist;Standing Grooming Details (indicate cue type and reason): He performed hand washing standing at the sink. Upper Body Bathing: Set up;Sitting   Lower Body Bathing: Contact guard assist;Sit to/from stand   Upper Body Dressing : Set up;Sitting   Lower Body Dressing: Contact guard assist;Sit to/from stand   Toilet Transfer: Contact guard assist;Ambulation;Grab bars Toilet Transfer Details (indicate cue type and reason): He ambulated to and from the bathroom in his room without an assistive device. Toileting- Clothing Manipulation and Hygiene: Contact guard assist;Sit to/from stand;Cueing for safety Toileting - Clothing Manipulation Details (indicate cue type and reason): He stood to urinate in standing at sink level. He used teh grab bar for added support.                          Pertinent Vitals/Pain Pain Assessment Pain Assessment: No/denies pain     Extremity/Trunk Assessment Upper Extremity Assessment Upper Extremity Assessment: Overall WFL for tasks assessed;RUE deficits/detail (BUE AROM WFL. BUE grip strength 4/5)   Lower Extremity Assessment Lower Extremity Assessment: Overall WFL for tasks assessed;RLE deficits/detail;LLE deficits/detail RLE Deficits / Details: AROM  WFL LLE Deficits / Details: AROM WFL       Communication Communication Communication: No apparent difficulties   Cognition Arousal: Alert Behavior During Therapy: WFL for tasks assessed/performed               OT - Cognition Comments: Oriented x4, occasionally guarded                 Following  commands: Intact       Cueing  General Comments   Cueing Techniques: Verbal cues              Home Living Family/patient expects to be discharged to:: Private residence Living Arrangements: Alone Available Help at Discharge: Friend(s);Available PRN/intermittently;Family;Personal care attendant Type of Home: Apartment Home Access: Stairs to enter Entrance Stairs-Number of Steps: 3 Entrance Stairs-Rails: None Home Layout: One level     Bathroom Shower/Tub: Tub/shower unit;Sponge bathes at baseline         Home Equipment: Agricultural Consultant (2 wheels);Rollator (4 wheels)   Additional Comments:  (he has a hired in-home helper 3 days per week for a couple hours each day who assisted with cleaning and cooking)      Prior Functioning/Environment Prior Level of Function : Independent/Modified Independent;History of Falls (last six months)             Mobility Comments:  (He used a rollator as needed for ambulation.) ADLs Comments:  (He was independent with dressing, toileting, and sponge bathing. He uses public transportation.)    OT Problem List: Impaired balance (sitting and/or standing);Decreased knowledge of use of DME or AE;Decreased safety awareness   OT Treatment/Interventions: Therapeutic exercise;Therapeutic activities;Self-care/ADL training;Energy conservation;Patient/family education;DME and/or AE instruction;Balance training      OT Goals(Current goals can be found in the care plan section)   Acute Rehab OT Goals Patient Stated Goal: to return home at discharge OT Goal Formulation: With patient Time For Goal Achievement: 06/04/24 Potential to Achieve Goals: Good ADL Goals Pt Will Perform Grooming: with modified independence;standing Pt Will Perform Lower Body Dressing: with modified independence;sit to/from stand Pt Will Transfer to Toilet: with modified independence;ambulating Pt Will Perform Toileting - Clothing Manipulation and hygiene: with modified  independence;sit to/from stand   OT Frequency:  Min 2X/week       AM-PAC OT 6 Clicks Daily Activity     Outcome Measure Help from another person eating meals?: None Help from another person taking care of personal grooming?: A Little Help from another person toileting, which includes using toliet, bedpan, or urinal?: A Little Help from another person bathing (including washing, rinsing, drying)?: A Little Help from another person to put on and taking off regular upper body clothing?: A Little Help from another person to put on and taking off regular lower body clothing?: A Little 6 Click Score: 19   End of Session Equipment Utilized During Treatment: Other (comment) Nurse Communication: Mobility status  Activity Tolerance: Patient tolerated treatment well Patient left: in bed;with call bell/phone within reach  OT Visit Diagnosis: History of falling (Z91.81);Other abnormalities of gait and mobility (R26.89)                Time: 1713-1730 OT Time Calculation (min): 17 min Charges:  OT General Charges $OT Visit: 1 Visit OT Evaluation $OT Eval Moderate Complexity: 1 Mod    Deniese Oberry J Harris, OTR/L 05/21/2024, 5:58 PM

## 2024-05-21 NOTE — H&P (Addendum)
 History and Physical  Mark [name] FMW:[id number] DOB: [birth year] DOA: 05/20/2024  Referring physician: Dr. Lorette, EDP  PCP: Campbell Reynolds, NP  Outpatient Specialists: None. Patient coming from: Home.  Chief Complaint: Fall  HPI: Mark [name] is a 73 y.o. male with medical history significant for hypertension, asthma, alcohol and tobacco abuse, combined diastolic and systolic CHF, paroxysmal A-fib on Eliquis , multiple falls, who presents to the ER after another fall.  He uses a rolling walker at baseline.  He was going to bed and fell and hit the side of his head on a piece of furniture in his bedroom.  Denies loss of consciousness.  States he lives by himself and has not eaten in days.  He cooks his own meals.  States he has a son who lives in the area.  In the ER, hypertensive, weak appearing.  Noncontrast head CT revealed no acute intracranial abnormality.  Lab studies were notable for hypokalemia.  EDP requesting admission due to generalized weakness and hypokalemia.  Admitted by [facility], hospitalist service.  ED Course: Temperature 98.4.  BP 176/90, pulse 84, respiratory 19, O2 saturation 99% on room air.  Review of Systems: Review of systems as noted in the HPI. All other systems reviewed and are negative.   Past Medical History:  Diagnosis Date   Abnormal thyroid  function test    Alcohol abuse    Allergic rhinitis    Asthma    BPH (benign prostatic hyperplasia)    Carpal tunnel syndrome    Chronic heart failure with preserved ejection fraction (HFpEF) (HCC)    Delirium    Hypertension    PAF (paroxysmal atrial fibrillation) (HCC)    Tobacco abuse    Type 2 MI (myocardial infarction) [facility])    Past Surgical History:  Procedure Laterality Date   INGUINAL HERNIA REPAIR Bilateral 05/06/2023   Procedure: HERNIA REPAIR INGUINAL ADULT with MESH;  Surgeon: Vernetta Berg, MD;  Location: [facility] ORS;  Service: General;  Laterality: Bilateral;    Social History:  reports that  he has been smoking cigarettes. He has never used smokeless tobacco. He reports current alcohol use. He reports that he does not use drugs.   No Known Allergies  Family History  Problem Relation Age of Onset   Colon cancer Father       Prior to Admission medications   Medication Sig Start Date End Date Taking? Authorizing Provider  acetaminophen  (TYLENOL ) 500 MG tablet Take 2 tablets (1,000 mg total) by mouth every 6 (six) hours as needed for mild pain (pain score 1-3) (or Fever >/= 101). 05/08/23   Tammy Sor, PA-C  albuterol  (PROVENTIL  HFA;VENTOLIN  HFA) 108 (90 Base) MCG/ACT inhaler Inhale 2 puffs into the lungs every 4 (four) hours as needed for wheezing or shortness of breath. 07/03/16   Horton, Charmaine FALCON, MD  atorvastatin  (LIPITOR) 40 MG tablet Take 1 tablet (40 mg total) by mouth at bedtime. 12/17/23   Addie Perkins, DO  cephALEXin  (KEFLEX ) 500 MG capsule Take 1 capsule (500 mg total) by mouth 4 (four) times daily. 01/29/24   Myriam Dorn BROCKS, PA  cephALEXin  (KEFLEX ) 500 MG capsule Take 1 capsule (500 mg total) by mouth 4 (four) times daily. 03/03/24   Minnie Tinnie BRAVO, PA  folic acid  (FOLVITE ) 1 MG tablet Take 1 tablet (1 mg total) by mouth daily. 01/07/24   Gonfa, Taye T, MD  HYDROcodone -acetaminophen  (NORCO/VICODIN) 5-325 MG tablet Take 2 tablets by mouth every 4 (four) hours as needed. 01/29/24   Myriam Dorn BROCKS,  PA  loratadine  (CLARITIN ) 10 MG tablet Take 1 tablet (10 mg total) by mouth daily. 12/18/23   Addie Perkins, DO  losartan  (COZAAR ) 50 MG tablet Take 50 mg by mouth daily. 12/18/23   [provider]  metoprolol  succinate (TOPROL -XL) 100 MG 24 hr tablet Take 1 tablet (100 mg total) by mouth daily. 12/17/23   Addie Perkins, DO  Multiple Vitamin (MULTIVITAMIN WITH MINERALS) TABS tablet Take 1 tablet by mouth daily.    [provider]  omeprazole  (PRILOSEC  OTC) 20 MG tablet Take 20 mg by mouth daily as needed (for reflux or heartburn).    [provider]   pantoprazole  (PROTONIX ) 40 MG tablet Take 1 tablet (40 mg total) by mouth daily. 01/07/24   Gonfa, Taye T, MD  polyethylene glycol (MIRALAX ) 17 g packet Take 17 g by mouth daily. 06/22/23   Ula Prentice SAUNDERS, MD  senna-docusate (SENOKOT-S) 8.6-50 MG tablet Take 1 tablet by mouth at bedtime as needed for moderate constipation. 06/22/23   Ula Prentice SAUNDERS, MD  SYMBICORT 160-4.5 MCG/ACT inhaler Inhale 2 puffs into the lungs daily.  10/14/19   [provider]  thiamine  (VITAMIN B1) 100 MG tablet Take 1 tablet (100 mg total) by mouth daily. 01/07/24   Gonfa, Taye T, MD    Physical Exam: BP (!) 176/90 (BP Location: Right Arm)   Pulse 84   Temp 98.4 F (36.9 C) (Oral)   Resp 19   Ht 6' 2 (1.88 m)   Wt 72.6 kg   SpO2 99%   BMI 20.54 kg/m   General: 73 y.o. year-old male well developed well nourished in no acute distress.  Alert and oriented x3. Cardiovascular: Regular rate and rhythm with no rubs or gallops.  No thyromegaly or JVD noted.  No lower extremity edema. 2/4 pulses in all 4 extremities. Respiratory: Clear to auscultation with no wheezes or rales. Good inspiratory effort. Abdomen: Soft nontender nondistended with normal bowel sounds x4 quadrants. Muskuloskeletal: No cyanosis, clubbing or edema noted bilaterally Neuro: CN II-XII intact, strength, sensation, reflexes Skin: No ulcerative lesions noted or rashes Psychiatry: Judgement and insight appear normal. Mood is appropriate for condition and setting          Labs on Admission:  Basic Metabolic Panel: Recent Labs  Lab 05/20/24 1513 05/20/24 1519 05/21/24 0140  NA 141 142  --   K 2.9* 2.9* 2.8*  CL 101 101  --   CO2 22  --   --   GLUCOSE 92 91  --   BUN 8 9  --   CREATININE 0.95 1.30*  --   CALCIUM  8.7*  --   --   MG  --   --  2.4   Liver Function Tests: No results for input(s): AST, ALT, ALKPHOS, BILITOT, PROT, ALBUMIN in the last 168 hours. No results for input(s): LIPASE, AMYLASE in the last 168  hours. No results for input(s): AMMONIA in the last 168 hours. CBC: Recent Labs  Lab 05/20/24 1427 05/20/24 1519  WBC 6.9  --   NEUTROABS 3.5  --   HGB 13.7 14.3  HCT 39.4 42.0  MCV 93.4  --   PLT 237  --    Cardiac Enzymes: No results for input(s): CKTOTAL, CKMB, CKMBINDEX, TROPONINI in the last 168 hours.  BNP (last 3 results) Recent Labs    12/16/23 0927  BNP 21.9    ProBNP (last 3 results) No results for input(s): PROBNP in the last 8760 hours.  CBG: No  results for input(s): GLUCAP in the last 168 hours.  Radiological Exams on Admission: DG Chest 2 View Result Date: 05/21/2024 EXAM: 2 VIEW(S) XRAY OF THE CHEST 05/20/2024 11:59:53 PM COMPARISON: CT chest dated 01/03/2024. CLINICAL HISTORY: eval FINDINGS: LUNGS AND PLEURA: Left basilar scarring/atelectasis. No pleural effusion. No pneumothorax. HEART AND MEDIASTINUM: No acute abnormality of the cardiac and mediastinal silhouettes. BONES AND SOFT TISSUES: Old left posterior 7th and 8th rib fractures. Old right anterior 6th rib fracture. IMPRESSION: 1. Left basilar scarring/atelectasis. Electronically signed by: Pinkie Pebbles MD 05/21/2024 12:04 AM EST RP Workstation: [id number]   CT Head Wo Contrast Result Date: 05/20/2024 EXAM: CT HEAD WITHOUT CONTRAST 05/20/2024 11:49:12 PM TECHNIQUE: CT of the head was performed without the administration of intravenous contrast. Automated exposure control, iterative reconstruction, and/or weight based adjustment of the mA/kV was utilized to reduce the radiation dose to as low as reasonably achievable. COMPARISON: 10/19/2023 CLINICAL HISTORY: Head trauma, minor (Age >= 65y) FINDINGS: BRAIN AND VENTRICLES: No acute hemorrhage. No evidence of acute infarct. No hydrocephalus. No extra-axial collection. No mass effect or midline shift. Global cortical atrophy. Subcortical and periventricular small vessel ischemic changes. Intracranial atherosclerosis. ORBITS: No acute  abnormality. SINUSES: No acute abnormality. SOFT TISSUES AND SKULL: No acute soft tissue abnormality. No skull fracture. IMPRESSION: 1. No acute intracranial abnormality. Electronically signed by: Pinkie Pebbles MD 05/20/2024 11:53 PM EST RP Workstation: [id number]    EKG: I independently viewed the EKG done and my findings are as followed: Sinus rhythm rate of 80.  QTc 500.  Assessment/Plan Present on Admission:  Hypokalemia  Principal Problem:   Hypokalemia  Hypokalemia, suspect secondary to poor oral intake Serum potassium 2.8 Repleted orally Magnesium  1.7, repleted.  Moderate protein calorie malnutrition BMI 20 Moderate muscle mass loss Encourage oral protein calorie intake Endorses he has had difficulties with getting meals at home [facility] consulted to assist  Chronic combined diastolic and systolic CHF Last 2D echo done on 05/06/2023 revealed LVEF 30 to 35% and grade 1 diastolic dysfunction Euvolemic on exam Monitor strict I's and O's and daily weight.  Generalized weakness PT OT assessment Fall precautions At baseline uses a rolling walker.  Hypertension Resume home Coreg  Closely monitor vital signs  QTc prolongation Admission 12-lead EKG with QTc of 500 Avoid QTc prolonging agents Optimize magnesium  and potassium levels Monitor on telemetry   Time: 75 minutes.   DVT prophylaxis: Subcu Lovenox  daily  Code Status: Full code.  Family Communication: None at bedside.  Disposition Plan: Admitted to telemetry unit.  Consults called: None.  Admission status: Observation status.   Status is: Observation    Terry LOISE Hurst MD [facility] Pager [phone]  If 7PM-7AM, please contact night-coverage [email or web address] Password [email or web address]  05/21/2024, 2:53 AM

## 2024-05-22 DIAGNOSIS — J45909 Unspecified asthma, uncomplicated: Secondary | ICD-10-CM | POA: Diagnosis not present

## 2024-05-22 DIAGNOSIS — R531 Weakness: Secondary | ICD-10-CM

## 2024-05-22 DIAGNOSIS — I10 Essential (primary) hypertension: Secondary | ICD-10-CM | POA: Diagnosis not present

## 2024-05-22 DIAGNOSIS — E876 Hypokalemia: Secondary | ICD-10-CM | POA: Diagnosis not present

## 2024-05-22 LAB — BASIC METABOLIC PANEL WITH GFR
Anion gap: 11 (ref 5–15)
BUN: 8 mg/dL (ref 8–23)
CO2: 23 mmol/L (ref 22–32)
Calcium: 9 mg/dL (ref 8.9–10.3)
Chloride: 102 mmol/L (ref 98–111)
Creatinine, Ser: 1.07 mg/dL (ref 0.61–1.24)
GFR, Estimated: >60 mL/min (ref >60)
Glucose, Bld: 81 mg/dL (ref 70–99)
Potassium: 4 mmol/L (ref 3.5–5.1)
Sodium: 136 mmol/L (ref 135–145)

## 2024-05-22 MED ORDER — LOSARTAN POTASSIUM 50 MG PO TABS
50.0000 mg | ORAL_TABLET | Freq: Every day | ORAL | Status: DC
  Administered 2024-05-22 – 2024-05-23 (×2): 50 mg via ORAL
  Filled 2024-05-22 (×2): qty 1

## 2024-05-22 MED ORDER — CARVEDILOL 6.25 MG PO TABS
6.2500 mg | ORAL_TABLET | Freq: Two times a day (BID) | ORAL | Status: DC
  Administered 2024-05-22 – 2024-05-23 (×3): 6.25 mg via ORAL
  Filled 2024-05-22 (×3): qty 1

## 2024-05-22 NOTE — Progress Notes (Signed)
 [facility]                                                                               Mark [name], is a 73 y.o. male, DOB - 1951-01-05, FMW:[id number] Admit date - 05/20/2024    Outpatient Primary MD for the patient is Campbell Reynolds, NP  LOS - 0  days    Brief summary   [name] is a 73 y.o. male with medical history significant for hypertension, asthma, alcohol and tobacco abuse, combined diastolic and systolic CHF, paroxysmal A-fib on Eliquis , multiple falls, who presents to the ER after another fall.  He uses a rolling walker at baseline.  He was going to bed and fell and hit the side of his head on a piece of furniture in his bedroom.  Denies loss of consciousness.  States he lives by himself and has not eaten in days.    On arrival to ED, he was found to be severely hypokalemic.    Assessment & Plan   Severe hypokalemia secondary to poor oral intake and moderate protein-calorie malnutrition Replaced, repeat level within normal limits    Chronic combined diastolic and systolic heart failure Last echocardiogram in October 2024 showed left ventricular ejection fraction of 30 to 35% with grade 1 diastolic dysfunction He appears euvolemic  Generalized weakness PT/OT evaluation recommending SNF    Essential hypertension Blood pressure parameters appear to be optimal continue with home medications    QTc Repeat EKG today  keep potassium greater than 4 and magnesium  greater than 2    History of asthma No wheezing heard patient on albuterol  inhaler continue the same   Estimated body mass index is 20.54 kg/m as calculated from the following:   Height as of this encounter: 6' 2 (1.88 m).   Weight as of this encounter: 72.6 kg.  Code Status: FULL CODE.  DVT Prophylaxis:  enoxaparin  (LOVENOX ) injection 40 mg Start: 05/21/24 1000   Level of Care: Level of care: Telemetry Family Communication: NONE AT BEDSIDE.,  Called his son and all  the numbers on facesheet, unable to reach family or even leave a message.  Disposition Plan:     Remains inpatient appropriate: Pending SNF placement  Procedures:  None  Consultants:   None  Antimicrobials:   Anti-infectives (From admission, onward)    None        Medications  Scheduled Meds:  carvedilol   6.25 mg Oral BID WC   enoxaparin  (LOVENOX ) injection  40 mg Subcutaneous Daily   losartan   50 mg Oral Daily   potassium chloride   40 mEq Oral BID   Continuous Infusions: PRN Meds:.acetaminophen , albuterol , melatonin, polyethylene glycol, prochlorperazine    Subjective:   Mark [name] was seen and examined today.  No chest pain or shortness of breath, no new complaints at this time  Objective:   Vitals:   05/22/24 0025 05/22/24 0419 05/22/24 0432 05/22/24 0835  BP: (!) 156/101 (!) 147/124 (!) 151/118 (!) 158/79  Pulse: 76 74 72 69  Resp: 18 17  20   Temp: 98.4 F (36.9 C) 98.4 F (36.9 C)  97.7 F (36.5 C)  TempSrc:    Oral  SpO2:  99% 99%  100%  Weight:      Height:        Intake/Output Summary (Last 24 hours) at 05/22/2024 0934 Last data filed at 05/22/2024 0521 Gross per 24 hour  Intake 1000 ml  Output 575 ml  Net 425 ml   Filed Weights   05/20/24 1350  Weight: 72.6 kg     Exam General: Alert and oriented x 3, NAD Cardiovascular: S1 S2 auscultated, no murmurs, RRR Respiratory: Clear to auscultation bilaterally, no wheezing, rales or rhonchi Gastrointestinal: Soft, nontender, nondistended, + bowel sounds Ext: no pedal edema bilaterally Neuro: AAOx3,    Data Reviewed:  I have personally reviewed following labs and imaging studies   CBC Lab Results  Component Value Date   WBC 4.8 05/21/2024   RBC 3.81 (L) 05/21/2024   HGB 12.5 (L) 05/21/2024   HCT 36.3 (L) 05/21/2024   MCV 95.3 05/21/2024   MCH 32.8 05/21/2024   PLT 213 05/21/2024   MCHC 34.4 05/21/2024   RDW 14.3 05/21/2024   LYMPHSABS 2.7 05/20/2024   MONOABS 0.6  05/20/2024   EOSABS 0.0 05/20/2024   BASOSABS 0.0 05/20/2024     Last metabolic panel Lab Results  Component Value Date   NA 136 05/21/2024   K 3.5 05/21/2024   CL 102 05/21/2024   CO2 24 05/21/2024   BUN 7 (L) 05/21/2024   CREATININE 0.85 05/21/2024   GLUCOSE 108 (H) 05/21/2024   GFRNONAA >60 05/21/2024   GFRAA >60 11/12/2019   CALCIUM  8.4 (L) 05/21/2024   PHOS 2.6 05/21/2024   PROT 7.6 03/03/2024   ALBUMIN 3.7 03/03/2024   BILITOT 0.8 03/03/2024   ALKPHOS 95 03/03/2024   AST 25 03/03/2024   ALT 13 03/03/2024   ANIONGAP 10 05/21/2024    CBG (last 3)  No results for input(s): GLUCAP in the last 72 hours.    Coagulation Profile: No results for input(s): INR, PROTIME in the last 168 hours.   Radiology Studies: DG Chest 2 View Result Date: 05/21/2024 EXAM: 2 VIEW(S) XRAY OF THE CHEST 05/20/2024 11:59:53 PM COMPARISON: CT chest dated 01/03/2024. CLINICAL HISTORY: eval FINDINGS: LUNGS AND PLEURA: Left basilar scarring/atelectasis. No pleural effusion. No pneumothorax. HEART AND MEDIASTINUM: No acute abnormality of the cardiac and mediastinal silhouettes. BONES AND SOFT TISSUES: Old left posterior 7th and 8th rib fractures. Old right anterior 6th rib fracture. IMPRESSION: 1. Left basilar scarring/atelectasis. Electronically signed by: Pinkie Pebbles MD 05/21/2024 12:04 AM EST RP Workstation: [id number]   CT Head Wo Contrast Result Date: 05/20/2024 EXAM: CT HEAD WITHOUT CONTRAST 05/20/2024 11:49:12 PM TECHNIQUE: CT of the head was performed without the administration of intravenous contrast. Automated exposure control, iterative reconstruction, and/or weight based adjustment of the mA/kV was utilized to reduce the radiation dose to as low as reasonably achievable. COMPARISON: 10/19/2023 CLINICAL HISTORY: Head trauma, minor (Age >= 65y) FINDINGS: BRAIN AND VENTRICLES: No acute hemorrhage. No evidence of acute infarct. No hydrocephalus. No extra-axial collection. No mass  effect or midline shift. Global cortical atrophy. Subcortical and periventricular small vessel ischemic changes. Intracranial atherosclerosis. ORBITS: No acute abnormality. SINUSES: No acute abnormality. SOFT TISSUES AND SKULL: No acute soft tissue abnormality. No skull fracture. IMPRESSION: 1. No acute intracranial abnormality. Electronically signed by: Pinkie Pebbles MD 05/20/2024 11:53 PM EST RP Workstation: [id number]       Elgie Butter M.D. [facility] 05/22/2024, 9:34 AM  Available via Epic secure chat 7am-7pm After 7 pm, please refer to night coverage provider listed on amion.

## 2024-05-22 NOTE — Care Management Obs Status (Signed)
 MEDICARE OBSERVATION STATUS NOTIFICATION   Patient Details  Name: Mark [name] MRN: [id number] Date of Birth: 03/12/1951   Medicare Observation Status Notification Given:  Yes Due to a precaution in place called the patient and reviewed the obs notice via phone.     Demeco Ducksworth 05/22/2024, 10:09 AM

## 2024-05-22 NOTE — Plan of Care (Signed)

## 2024-05-22 NOTE — Progress Notes (Signed)
 Pt seen and examined.   Admitted earlier this am by Dr Shona for generalized weakness.  Mark [name] is a 73 y.o. male with medical history significant for hypertension, asthma, alcohol and tobacco abuse, combined diastolic and systolic CHF, paroxysmal A-fib on Eliquis , multiple falls, who presents to the ER after another fall.  He uses a rolling walker at baseline.  He was going to bed and fell and hit the side of his head on a piece of furniture in his bedroom.  Denies loss of consciousness.  States he lives by himself and has not eaten in days.   On arrival to ED, he was found to be severely hypokalemic.   General exam: Appears calm and comfortable  Respiratory system: Clear to auscultation. Respiratory effort normal. Cardiovascular system: S1 & S2 heard, RRR. Gastrointestinal system: Abdomen is nondistended, soft and nontender.  Central nervous system: Alert and oriented.  Extremities: Symmetric 5 x 5 power. Skin: No rashes,  Psychiatry:  Mood & affect appropriate.    Plan Replace K. PT/OT evaluations ordered.    Dailan Pfalzgraf, MD

## 2024-05-22 NOTE — Plan of Care (Signed)

## 2024-05-23 ENCOUNTER — Other Ambulatory Visit (HOSPITAL_COMMUNITY): Payer: Self-pay

## 2024-05-23 DIAGNOSIS — J45909 Unspecified asthma, uncomplicated: Secondary | ICD-10-CM | POA: Diagnosis not present

## 2024-05-23 DIAGNOSIS — I10 Essential (primary) hypertension: Secondary | ICD-10-CM | POA: Diagnosis not present

## 2024-05-23 DIAGNOSIS — E876 Hypokalemia: Secondary | ICD-10-CM | POA: Diagnosis not present

## 2024-05-23 MED ORDER — CARVEDILOL 6.25 MG PO TABS
6.2500 mg | ORAL_TABLET | Freq: Two times a day (BID) | ORAL | 2 refills | Status: AC
  Filled 2024-05-23: qty 60, 30d supply, fill #0

## 2024-05-23 MED ORDER — ADULT MULTIVITAMIN W/MINERALS CH: 1.0000 | ORAL_TABLET | Freq: Every day | ORAL | 2 refills | Status: AC

## 2024-05-23 MED ORDER — ENSURE COMPLETE PO LIQD
237.0000 mL | Freq: Two times a day (BID) | ORAL | 3 refills | Status: AC
  Filled 2024-05-23: qty 42660, 90d supply, fill #0

## 2024-05-23 NOTE — Progress Notes (Signed)

## 2024-05-23 NOTE — Discharge Summary (Signed)
 Physician Discharge Summary   Patient: Mark [name] MRN: [id number] DOB: 1950-07-17  Admit date:     05/20/2024  Discharge date: 05/23/24  Discharge Physician: Elgie Butter   PCP: Campbell Reynolds, NP   Recommendations at discharge:  Please follow up with PCP in one week.  Please follow up with home health PT  Discharge Diagnoses: Principal Problem:   Hypokalemia Hypertension.  Generalized weakness.    Hospital Course: Mark [name] is a 73 y.o. male with medical history significant for hypertension, asthma, alcohol and tobacco abuse, combined diastolic and systolic CHF, paroxysmal A-fib on Eliquis , multiple falls, who presents to the ER after another fall.  He uses a rolling walker at baseline.  He was going to bed and fell and hit the side of his head on a piece of furniture in his bedroom.  Denies loss of consciousness.  States he lives by himself and has not eaten in days.    On arrival to ED, he was found to be severely hypokalemic.   Assessment and Plan:   Severe hypokalemia secondary to poor oral intake and moderate protein-calorie malnutrition Replaced, repeat level within normal limits       Chronic combined diastolic and systolic heart failure Last echocardiogram in October 2024 showed left ventricular ejection fraction of 30 to 35% with grade 1 diastolic dysfunction He appears euvolemic   Generalized weakness PT/OT evaluation recommending SNF, but patient adamant and threatened to leave AMA if I dont discharge him. Called his son several times in the last 2 days . Unable to reach him or leave a voice mail.  This morning when patient wanted to go home, reached out to his friend vicki who helps him with his meals , tried to convince him to getting rehab.  He agreed for [facility] only.        Essential hypertension Blood pressure parameters slightly elevated, improved after restarting home meds.  Continue with coreg  and losartan  restarted.       QTc prolongation.    keep potassium greater than 4 and magnesium  greater than 2       History of asthma No wheezing heard patient on albuterol  inhaler continue the same     Estimated body mass index is 20.54 kg/m as calculated from the following:   Height as of this encounter: 6' 2 (1.88 m).   Weight as of this encounter: 72.6 kg.     Consultants: none.  Procedures performed: none.   Disposition: Home Diet recommendation:  Discharge Diet Orders (From admission, onward)     Start     Ordered   05/23/24 0000  Diet - low sodium heart healthy        05/23/24 0950           Regular diet DISCHARGE MEDICATION: Allergies as of 05/23/2024   No Known Allergies      Medication List     TAKE these medications    acetaminophen  650 MG CR tablet Commonly known as: TYLENOL  Take 650 mg by mouth every 8 (eight) hours as needed for pain.   albuterol  108 (90 Base) MCG/ACT inhaler Commonly known as: VENTOLIN  HFA Inhale 2 puffs into the lungs every 4 (four) hours as needed for wheezing or shortness of breath.   allopurinol 100 MG tablet Commonly known as: ZYLOPRIM Take 100 mg by mouth every other day.   carvedilol  6.25 MG tablet Commonly known as: COREG  Take 1 tablet (6.25 mg total) by mouth 2 (two) times daily with a meal. What  changed:  medication strength how much to take when to take this   feeding supplement (ENSURE COMPLETE) Liqd Take 237 mLs by mouth 2 (two) times daily between meals.   Icy Hot Advanced Pain Relief 16-11 % Crea Generic drug: Menthol-Camphor Apply 1 Application topically as needed (joint/muscle pain).   ipratropium-albuterol  0.5-2.5 (3) MG/3ML Soln Commonly known as: DUONEB Inhale 3 mLs into the lungs every 6 (six) hours as needed.   losartan  50 MG tablet Commonly known as: COZAAR  Take 50 mg by mouth at bedtime.   multivitamin with minerals Tabs tablet Take 1 tablet by mouth daily.   Symbicort 160-4.5 MCG/ACT inhaler Generic drug:  budesonide -formoterol  Inhale 2 puffs into the lungs daily.        Contact information for follow-up providers     [facility] Follow up.   Specialty: Home Health Services Why: [facility] will provide home health services.  They will call you in the next 24-48 hours to set up services. Contact information: [address] [place] [address] [phone]              Contact information for after-discharge care     Home Medical Care     [facility]) .   Service: Home Health Services Contact information: [address] [place]  [address] [phone]                    Discharge Exam: Fredricka Weights   05/20/24 1350  Weight: 72.6 kg   General exam: Appears calm and comfortable  Respiratory system: Clear to auscultation. Respiratory effort normal. Cardiovascular system: S1 & S2 heard, RRR. Gastrointestinal system: Abdomen is nondistended, soft and nontender Central nervous system: Alert and oriented. Grossly non focal.  Extremities: Symmetric 5 x 5 power. Skin: No rashes,  Psychiatry: mood is appropriate.     Condition at discharge: fair  The results of significant diagnostics from this hospitalization (including imaging, microbiology, ancillary and laboratory) are listed below for reference.   Imaging Studies: DG Chest 2 View Result Date: 05/21/2024 EXAM: 2 VIEW(S) XRAY OF THE CHEST 05/20/2024 11:59:53 PM COMPARISON: CT chest dated 01/03/2024. CLINICAL HISTORY: eval FINDINGS: LUNGS AND PLEURA: Left basilar scarring/atelectasis. No pleural effusion. No pneumothorax. HEART AND MEDIASTINUM: No acute abnormality of the cardiac and mediastinal silhouettes. BONES AND SOFT TISSUES: Old left posterior 7th and 8th rib fractures. Old right anterior 6th rib fracture. IMPRESSION: 1. Left basilar scarring/atelectasis. Electronically signed by: Pinkie Pebbles MD 05/21/2024  12:04 AM EST RP Workstation: [id number]   CT Head Wo Contrast Result Date: 05/20/2024 EXAM: CT HEAD WITHOUT CONTRAST 05/20/2024 11:49:12 PM TECHNIQUE: CT of the head was performed without the administration of intravenous contrast. Automated exposure control, iterative reconstruction, and/or weight based adjustment of the mA/kV was utilized to reduce the radiation dose to as low as reasonably achievable. COMPARISON: 10/19/2023 CLINICAL HISTORY: Head trauma, minor (Age >= 65y) FINDINGS: BRAIN AND VENTRICLES: No acute hemorrhage. No evidence of acute infarct. No hydrocephalus. No extra-axial collection. No mass effect or midline shift. Global cortical atrophy. Subcortical and periventricular small vessel ischemic changes. Intracranial atherosclerosis. ORBITS: No acute abnormality. SINUSES: No acute abnormality. SOFT TISSUES AND SKULL: No acute soft tissue abnormality. No skull fracture. IMPRESSION: 1. No acute intracranial abnormality. Electronically signed by: Pinkie Pebbles MD 05/20/2024 11:53 PM EST RP Workstation: [id number]    Microbiology: Results for orders placed or performed during the hospital encounter of 01/02/24  Blood  Culture (routine x 2)     Status: None   Collection Time: 01/02/24 10:17 PM   Specimen: BLOOD  Result Value Ref Range Status   Specimen Description   Final    BLOOD LEFT ANTECUBITAL Performed at [facility], [address] [place], [place] [address]    Special Requests   Final    BOTTLES DRAWN AEROBIC AND ANAEROBIC Blood Culture results may not be optimal due to an inadequate volume of blood received in culture bottles Performed at [facility], [address] [place], [place] [address]    Culture   Final    NO GROWTH 5 DAYS Performed at [facility], [address] [place], [place] [address]    Report Status 01/08/2024 FINAL  Final  Blood Culture (routine x 2)     Status: None   Collection Time: 01/02/24 10:33 PM    Specimen: BLOOD  Result Value Ref Range Status   Specimen Description   Final    BLOOD RIGHT ANTECUBITAL Performed at [facility], [address] [place], [place] [address]    Special Requests   Final    BOTTLES DRAWN AEROBIC AND ANAEROBIC Blood Culture results may not be optimal due to an inadequate volume of blood received in culture bottles Performed at [facility], [address] [place], [place] [address]    Culture   Final    NO GROWTH 5 DAYS Performed at [facility], [address], [place], [place] [address]    Report Status 01/08/2024 FINAL  Final  Resp panel by RT-PCR (RSV, Flu A&B, Covid) Anterior Nasal Swab     Status: None   Collection Time: 01/02/24 10:45 PM   Specimen: Anterior Nasal Swab  Result Value Ref Range Status   SARS Coronavirus 2 by RT PCR NEGATIVE NEGATIVE Final    Comment: (NOTE) SARS-CoV-2 target nucleic acids are NOT DETECTED.  The SARS-CoV-2 RNA is generally detectable in upper respiratory specimens during the acute phase of infection. The lowest concentration of SARS-CoV-2 viral copies this assay can detect is 138 copies/mL. A negative result does not preclude SARS-Cov-2 infection and should not be used as the sole basis for treatment or other patient management decisions. A negative result may occur with  improper specimen collection/handling, submission of specimen other than nasopharyngeal swab, presence of viral mutation(s) within the areas targeted by this assay, and inadequate number of viral copies(<138 copies/mL). A negative result must be combined with clinical observations, patient history, and epidemiological information. The expected result is Negative.  Fact Sheet for Patients:  bloggercourse.com  Fact Sheet for Healthcare Providers:  seriousbroker.it  This test is no t yet approved or cleared by the [place]  FDA and  has been  authorized for detection and/or diagnosis of SARS-CoV-2 by FDA under an Emergency Use Authorization (EUA). This EUA will remain  in effect (meaning this test can be used) for the duration of the COVID-19 declaration under Section 564(b)(1) of the Act, 21 U.S.C.section 360bbb-3(b)(1), unless the authorization is terminated  or revoked sooner.       Influenza A by PCR NEGATIVE NEGATIVE Final   Influenza B by PCR NEGATIVE NEGATIVE Final    Comment: (NOTE) The Xpert Xpress SARS-CoV-2/FLU/RSV plus assay is intended as an aid in the diagnosis of influenza from Nasopharyngeal swab specimens and should not be used as a sole basis for treatment. Nasal washings and aspirates are unacceptable for Xpert Xpress SARS-CoV-2/FLU/RSV  testing.  Fact Sheet for Patients: bloggercourse.com  Fact Sheet for Healthcare Providers: seriousbroker.it  This test is not yet approved or cleared by the [place]  FDA and has been authorized for detection and/or diagnosis of SARS-CoV-2 by FDA under an Emergency Use Authorization (EUA). This EUA will remain in effect (meaning this test can be used) for the duration of the COVID-19 declaration under Section 564(b)(1) of the Act, 21 U.S.C. section 360bbb-3(b)(1), unless the authorization is terminated or revoked.     Resp Syncytial Virus by PCR NEGATIVE NEGATIVE Final    Comment: (NOTE) Fact Sheet for Patients: bloggercourse.com  Fact Sheet for Healthcare Providers: seriousbroker.it  This test is not yet approved or cleared by the [place]  FDA and has been authorized for detection and/or diagnosis of SARS-CoV-2 by FDA under an Emergency Use Authorization (EUA). This EUA will remain in effect (meaning this test can be used) for the duration of the COVID-19 declaration under Section 564(b)(1) of the Act, 21 U.S.C. section 360bbb-3(b)(1), unless the  authorization is terminated or revoked.  Performed at [facility], [address], [place], [place] [address]   Urine Culture     Status: None   Collection Time: 01/03/24 10:59 AM   Specimen: Urine, Clean Catch  Result Value Ref Range Status   Specimen Description   Final    URINE, CLEAN CATCH Performed at [facility], [address] [place], [place] [address]    Special Requests   Final    NONE Performed at [facility], [address] [place], [place] [address]    Culture   Final    NO GROWTH Performed at [facility], [address] [place], [place] [address]    Report Status 01/04/2024 FINAL  Final    Labs: CBC: Recent Labs  Lab 05/20/24 1427 05/20/24 1519 05/21/24 0457  WBC 6.9  --  4.8  NEUTROABS 3.5  --   --   HGB 13.7 14.3 12.5*  HCT 39.4 42.0 36.3*  MCV 93.4  --  95.3  PLT 237  --  213   Basic Metabolic Panel: Recent Labs  Lab 05/20/24 1513 05/20/24 1519 05/21/24 0140 05/21/24 0457 05/22/24 1255  NA 141 142  --  136 136  K 2.9* 2.9* 2.8* 3.5 4.0  CL 101 101  --  102 102  CO2 22  --   --  24 23  GLUCOSE 92 91  --  108* 81  BUN 8 9  --  7* 8  CREATININE 0.95 1.30*  --  0.85 1.07  CALCIUM  8.7*  --   --  8.4* 9.0  MG  --   --  2.4 1.7  --   PHOS  --   --   --  2.6  --    Liver Function Tests: No results for input(s): AST, ALT, ALKPHOS, BILITOT, PROT, ALBUMIN in the last 168 hours. CBG: No results for input(s): GLUCAP in the last 168 hours.  Discharge time spent: 39 minutes.   Signed: Elgie Butter, MD [facility] 05/23/2024

## 2024-08-02 ENCOUNTER — Emergency Department (HOSPITAL_COMMUNITY)
Admission: EM | Admit: 2024-08-02 | Discharge: 2024-08-02 | Disposition: A | Discharge status: Home / Self Care | Attending: Emergency Medicine | Admitting: Emergency Medicine

## 2024-08-02 ENCOUNTER — Other Ambulatory Visit: Payer: Self-pay

## 2024-08-02 ENCOUNTER — Emergency Department (HOSPITAL_COMMUNITY)

## 2024-08-02 ENCOUNTER — Encounter (HOSPITAL_COMMUNITY): Payer: Self-pay

## 2024-08-02 DIAGNOSIS — G8929 Other chronic pain: Secondary | ICD-10-CM | POA: Insufficient documentation

## 2024-08-02 DIAGNOSIS — M545 Low back pain, unspecified: Secondary | ICD-10-CM | POA: Insufficient documentation

## 2024-08-02 DIAGNOSIS — E119 Type 2 diabetes mellitus without complications: Secondary | ICD-10-CM | POA: Diagnosis not present

## 2024-08-02 DIAGNOSIS — J45909 Unspecified asthma, uncomplicated: Secondary | ICD-10-CM | POA: Diagnosis not present

## 2024-08-02 DIAGNOSIS — F17290 Nicotine dependence, other tobacco product, uncomplicated: Secondary | ICD-10-CM | POA: Diagnosis not present

## 2024-08-02 DIAGNOSIS — I110 Hypertensive heart disease with heart failure: Secondary | ICD-10-CM | POA: Insufficient documentation

## 2024-08-02 DIAGNOSIS — W1839XA Other fall on same level, initial encounter: Secondary | ICD-10-CM | POA: Diagnosis not present

## 2024-08-02 DIAGNOSIS — I5030 Unspecified diastolic (congestive) heart failure: Secondary | ICD-10-CM | POA: Insufficient documentation

## 2024-08-02 DIAGNOSIS — R0602 Shortness of breath: Secondary | ICD-10-CM | POA: Diagnosis present

## 2024-08-02 LAB — CBC
HCT: 43.3 % (ref 39.0–52.0)
Hemoglobin: 15.2 g/dL (ref 13.0–17.0)
MCH: 33.8 pg (ref 26.0–34.0)
MCHC: 35.1 g/dL (ref 30.0–36.0)
MCV: 96.2 fL (ref 80.0–100.0)
Platelets: 341 10*3/uL (ref 150–400)
RBC: 4.5 MIL/uL (ref 4.22–5.81)
RDW: 12.9 % (ref 11.5–15.5)
WBC: 7 10*3/uL (ref 4.0–10.5)
nRBC: 0 % (ref 0.0–0.2)

## 2024-08-02 LAB — HEPATIC FUNCTION PANEL
ALT: 16 U/L (ref 0–44)
AST: 43 U/L — ABNORMAL HIGH (ref 15–41)
Albumin: 4.3 g/dL (ref 3.5–5.0)
Alkaline Phosphatase: 89 U/L (ref 38–126)
Bilirubin, Direct: 0.4 mg/dL — ABNORMAL HIGH (ref 0.0–0.2)
Indirect Bilirubin: 0.6 mg/dL (ref 0.3–0.9)
Total Bilirubin: 1 mg/dL (ref 0.0–1.2)
Total Protein: 8.1 g/dL (ref 6.5–8.1)

## 2024-08-02 LAB — PRO BRAIN NATRIURETIC PEPTIDE: Pro Brain Natriuretic Peptide: 358 pg/mL — ABNORMAL HIGH

## 2024-08-02 LAB — LIPASE, BLOOD: Lipase: 19 U/L (ref 11–51)

## 2024-08-02 LAB — BASIC METABOLIC PANEL WITH GFR
Anion gap: 15 (ref 5–15)
BUN: 7 mg/dL — ABNORMAL LOW (ref 8–23)
CO2: 23 mmol/L (ref 22–32)
Calcium: 10 mg/dL (ref 8.9–10.3)
Chloride: 97 mmol/L — ABNORMAL LOW (ref 98–111)
Creatinine, Ser: 1.02 mg/dL (ref 0.61–1.24)
GFR, Estimated: >60 mL/min
Glucose, Bld: 79 mg/dL (ref 70–99)
Potassium: 3.9 mmol/L (ref 3.5–5.1)
Sodium: 135 mmol/L (ref 135–145)

## 2024-08-02 LAB — TROPONIN T, HIGH SENSITIVITY
Troponin T High Sensitivity: 20 ng/L — ABNORMAL HIGH (ref 0–19)
Troponin T High Sensitivity: 18 ng/L (ref 0–19)

## 2024-08-02 LAB — RESP PANEL BY RT-PCR (RSV, FLU A&B, COVID)  RVPGX2
Influenza A by PCR: NEGATIVE
Influenza B by PCR: NEGATIVE
Resp Syncytial Virus by PCR: NEGATIVE
SARS Coronavirus 2 by RT PCR: NEGATIVE

## 2024-08-02 MED ORDER — ALBUTEROL SULFATE HFA 108 (90 BASE) MCG/ACT IN AERS
2.0000 | INHALATION_SPRAY | Freq: Once | RESPIRATORY_TRACT | Status: AC
  Administered 2024-08-02: 2 via RESPIRATORY_TRACT
  Filled 2024-08-02: qty 6.7

## 2024-08-02 MED ORDER — IPRATROPIUM-ALBUTEROL 0.5-2.5 (3) MG/3ML IN SOLN
3.0000 mL | Freq: Once | RESPIRATORY_TRACT | Status: AC
  Administered 2024-08-02: 3 mL via RESPIRATORY_TRACT
  Filled 2024-08-02: qty 3

## 2024-08-02 NOTE — ED Triage Notes (Signed)
 Pt BIB [facility] from home for fall and SHOB. Pt reports falling yesterday and is complaining of lower back pain. Pt does take eliquis , did not hit head, no LOC. Pt reporting increased SHOB, pt does have asthma, ran out of inhaler. Expiratory wheezing noted, EMS gave 5 albuterol . Pt also complaining of cough for several days. All VSS per EMS.

## 2024-08-02 NOTE — Discharge Instructions (Signed)
 I have refilled your albuterol  inhaler and sent you home with an inhaler.  Please follow closely with your primary care doctor.  Return with any new or send worsening symptoms.

## 2024-08-02 NOTE — ED Provider Notes (Signed)
 "  Emergency Department Provider Note   I have reviewed the triage vital signs and the nursing notes.   HISTORY  Chief Complaint Shortness of Breath   HPI Mark [name] is a 74 y.o. male past history reviewed below including A-fib, hypertension, asthma, diabetes presents to the emergency department with shortness of breath.  He also has a history of frequent falls.  He states he continues to have falls leading 1 yesterday with resulting lower back pain.  No numbness or weakness into the legs.  No head injury or loss of consciousness.  He is also developed shortness of breath in the past 1 to 2 days with cough and some chest discomfort.  Reports some mild runny nose.  No fevers.  Chest pain described more as a tightness and worse with coughing.  He thought to try his albuterol  inhaler but had run out.  He called EMS who gave 5 of albuterol  and route to the emergency department reporting expiratory wheezing on scene.    Past Medical History:  Diagnosis Date   Abnormal thyroid  function test    Alcohol abuse    Allergic rhinitis    Asthma    BPH (benign prostatic hyperplasia)    Carpal tunnel syndrome    Chronic heart failure with preserved ejection fraction (HFpEF) (HCC)    Delirium    Hypertension    PAF (paroxysmal atrial fibrillation) (HCC)    Tobacco abuse    Type 2 MI (myocardial infarction) (HCC)     Review of Systems  Constitutional: No fever/chills Cardiovascular: Positive chest pain. Respiratory: Positive shortness of breath. Gastrointestinal: No abdominal pain.  No nausea, no vomiting.   Neurological: Negative for headaches. ____________________________________________   PHYSICAL EXAM:  VITAL SIGNS: ED Triage Vitals  Encounter Vitals Group     BP 08/02/24 0850 (!) 154/92     Pulse Rate 08/02/24 0850 90     Resp 08/02/24 0850 18     Temp 08/02/24 0850 98.2 F (36.8 C)     Temp Source 08/02/24 0850 Oral     SpO2 08/02/24 0850 99 %     Weight 08/02/24 0847  160 lb (72.6 kg)     Height 08/02/24 0847 6' 2 (1.88 m)   Constitutional: Alert and oriented. Well appearing and in no acute distress. Eyes: Conjunctivae are normal. Head: Atraumatic. Nose: No congestion/rhinnorhea. Mouth/Throat: Mucous membranes are moist.   Neck: No stridor.  No cervical spine tenderness to palpation. Cardiovascular: Normal rate, regular rhythm. Good peripheral circulation. Grossly normal heart sounds.   Respiratory: Normal respiratory effort.  No retractions. Lungs CTAB. Gastrointestinal: Soft and nontender. No distention.  Musculoskeletal: No lower extremity tenderness nor edema. No gross deformities of extremities. Normal active ROM of the bilateral LEs.  Neurologic:  Normal speech and language. Normal strength and sensation in the bilateral lower extremities.  Skin:  Skin is warm, dry and intact. No rash noted.  ____________________________________________   LABS (all labs ordered are listed, but only abnormal results are displayed)  Labs Reviewed  BASIC METABOLIC PANEL WITH GFR - Abnormal; Notable for the following components:      Result Value   Chloride 97 (*)    BUN 7 (*)    All other components within normal limits  PRO BRAIN NATRIURETIC PEPTIDE - Abnormal; Notable for the following components:   Pro Brain Natriuretic Peptide 358.0 (*)    All other components within normal limits  HEPATIC FUNCTION PANEL - Abnormal; Notable for the following components:  AST 43 (*)    Bilirubin, Direct 0.4 (*)    All other components within normal limits  TROPONIN T, HIGH SENSITIVITY - Abnormal; Notable for the following components:   Troponin T High Sensitivity 20 (*)    All other components within normal limits  RESP PANEL BY RT-PCR (RSV, FLU A&B, COVID)  [id number]  CBC  LIPASE, BLOOD  TROPONIN T, HIGH SENSITIVITY   ____________________________________________  EKG   EKG Interpretation Date/Time:  Sunday August 02 2024 08:50:28 EST Ventricular Rate:  94 PR  Interval:  140 QRS Duration:  110 QT Interval:  384 QTC Calculation: 462 R Axis:   -45  Text Interpretation: Sinus rhythm Multiform ventricular premature complexes LAD, consider left anterior fascicular block Confirmed by Darra Chew [phone]) on 08/02/2024 8:55:46 AM        ____________________________________________  RADIOLOGY  DG Lumbar Spine Complete Result Date: 08/02/2024 CLINICAL DATA:  Fall yesterday.  Back pain. EXAM: LUMBAR SPINE - COMPLETE 4+ VIEW COMPARISON:  None Available. FINDINGS: Superior endplate compression deformity at L1 is stable since abdomen/pelvis CT of 01/03/2024. No acute lumbar spine fracture evident. Advanced degenerative disc disease is noted at L3-4 and L4-5 with associated endplate spurring. SI joints unremarkable. Bones are diffusely demineralized. IMPRESSION: 1. No acute bony abnormality. 2. Stable superior endplate compression deformity at L1 since CT 01/03/2024. 3. Advanced degenerative disc disease at L3-4 and L4-5. Electronically Signed   By: Camellia Candle M.D.   On: 08/02/2024 09:30   DG Chest 2 View Result Date: 08/02/2024 CLINICAL DATA:  Shortness of breath and cough. EXAM: CHEST - 2 VIEW COMPARISON:  05/20/2024 FINDINGS: The lungs are clear without focal pneumonia, edema, pneumothorax or pleural effusion. The cardiopericardial silhouette is within normal limits for size. No acute bony abnormality. Telemetry leads overlie the chest. IMPRESSION: No active cardiopulmonary disease. Electronically Signed   By: Camellia Candle M.D.   On: 08/02/2024 09:28    ____________________________________________   PROCEDURES  Procedure(s) performed:   Procedures  None  ____________________________________________   INITIAL IMPRESSION / ASSESSMENT AND PLAN / ED COURSE  Pertinent labs & imaging results that were available during my care of the patient were reviewed by me and considered in my medical decision making (see chart for details).   This patient is  Presenting for Evaluation of SOB, which does require a range of treatment options, and is a complaint that involves a high risk of morbidity and mortality.  The Differential Diagnoses includes asthma exacerbation, COVID, Flu, CAP, CHF, PE, ACS, etc.  Critical Interventions-    Medications  ipratropium-albuterol  (DUONEB) 0.5-2.5 (3) MG/3ML nebulizer solution 3 mL (3 mLs Nebulization Given 08/02/24 0937)  albuterol  (VENTOLIN  HFA) 108 (90 Base) MCG/ACT inhaler 2 puff (2 puffs Inhalation Given 08/02/24 1157)    Reassessment after intervention:  symptoms improved.    I did obtain Additional Historical Information from EMS.   Clinical Laboratory Tests Ordered, included minimally elevated BNP to 358. Troponin negative. COVID and Flu negative. LFTs and lipase negative. No AKI.   Radiologic Tests Ordered, included CXR and lumbar spine XR. I independently interpreted the images and agree with radiology interpretation.   Cardiac Monitor Tracing which shows NSR.    Social Determinants of Health Risk patient is a smoker.   Medical Decision Making: Summary:  Patient presents the emergency department for evaluation of shortness of breath primarily.  Also some low back pain with history of frequent falls.  I see from an admit in November he was evaluated and recommended  for SNF but insisted on returning home.  Also with frequent falls at that time.  Will obtain screening plain films of lumbar spine but low suspicion for acute traumatic injury.  He has intact strength, sensation, pulses in the lower extremities.  Abdomen soft.  Plan for nebulizer treatment and chest x-ray along with screening blood work and viral panel.  Reevaluation with update and discussion with patient. Feeling improved after Duoneb. No hypoxemia or increased WOB. Plan for discharge with albuterol  MDI.   Patient's presentation is most consistent with acute presentation with potential threat to life or bodily function.   Disposition:  discharge  ____________________________________________  FINAL CLINICAL IMPRESSION(S) / ED DIAGNOSES  Final diagnoses:  SOB (shortness of breath)  Chronic midline low back pain without sciatica    Note:  This document was prepared using Dragon voice recognition software and may include unintentional dictation errors.  Fonda Law, MD, [facility] Emergency Medicine    Jaxsyn Azam, Fonda MATSU, MD 08/02/24 1215  "
